# Patient Record
Sex: Female | Born: 1965 | Race: Black or African American | Hispanic: No | Marital: Married | Smoking: Never smoker
Health system: Southern US, Community
[De-identification: ages and names within clinical notes are randomized; demographics above are authoritative.]

## PROBLEM LIST (undated history)

## (undated) DIAGNOSIS — R0602 Shortness of breath: Secondary | ICD-10-CM

## (undated) DIAGNOSIS — U071 COVID-19: Secondary | ICD-10-CM

## (undated) DIAGNOSIS — K219 Gastro-esophageal reflux disease without esophagitis: Secondary | ICD-10-CM

## (undated) DIAGNOSIS — K641 Second degree hemorrhoids: Secondary | ICD-10-CM

## (undated) DIAGNOSIS — R739 Hyperglycemia, unspecified: Secondary | ICD-10-CM

## (undated) DIAGNOSIS — R7303 Prediabetes: Secondary | ICD-10-CM

## (undated) DIAGNOSIS — G932 Benign intracranial hypertension: Secondary | ICD-10-CM

## (undated) DIAGNOSIS — K228 Other specified diseases of esophagus: Secondary | ICD-10-CM

## (undated) DIAGNOSIS — N183 Chronic kidney disease, stage 3 unspecified: Secondary | ICD-10-CM

## (undated) DIAGNOSIS — Z6841 Body Mass Index (BMI) 40.0 and over, adult: Secondary | ICD-10-CM

## (undated) DIAGNOSIS — R519 Headache, unspecified: Secondary | ICD-10-CM

## (undated) DIAGNOSIS — O169 Unspecified maternal hypertension, unspecified trimester: Secondary | ICD-10-CM

## (undated) DIAGNOSIS — S838X9A Sprain of other specified parts of unspecified knee, initial encounter: Secondary | ICD-10-CM

## (undated) DIAGNOSIS — T380X5A Adverse effect of glucocorticoids and synthetic analogues, initial encounter: Secondary | ICD-10-CM

## (undated) DIAGNOSIS — M1990 Unspecified osteoarthritis, unspecified site: Secondary | ICD-10-CM

## (undated) DIAGNOSIS — T7840XA Allergy, unspecified, initial encounter: Secondary | ICD-10-CM

## (undated) DIAGNOSIS — K2289 Other specified disease of esophagus: Secondary | ICD-10-CM

## (undated) DIAGNOSIS — R51 Headache: Secondary | ICD-10-CM

## (undated) HISTORY — DX: Body Mass Index (BMI) 40.0 and over, adult: Z684

## (undated) HISTORY — DX: COVID-19: U07.1

## (undated) HISTORY — DX: Allergy, unspecified, initial encounter: T78.40XA

## (undated) HISTORY — DX: Shortness of breath: R06.02

## (undated) HISTORY — DX: Prediabetes: R73.03

## (undated) HISTORY — DX: Morbid (severe) obesity due to excess calories: E66.01

## (undated) HISTORY — DX: Gastro-esophageal reflux disease without esophagitis: K21.9

## (undated) HISTORY — DX: Benign intracranial hypertension: G93.2

## (undated) HISTORY — DX: Unspecified osteoarthritis, unspecified site: M19.90

## (undated) HISTORY — PX: ABDOMINAL HYSTERECTOMY: SHX81

## (undated) HISTORY — DX: Second degree hemorrhoids: K64.1

## (undated) HISTORY — PX: TONSILLECTOMY: SUR1361

## (undated) HISTORY — PX: OTHER SURGICAL HISTORY: SHX169

## (undated) HISTORY — DX: Chronic kidney disease, stage 3 unspecified: N18.30

## (undated) HISTORY — PX: BREAST REDUCTION SURGERY: SHX8

---

## 1998-08-22 ENCOUNTER — Emergency Department (HOSPITAL_COMMUNITY): Admission: EM | Admit: 1998-08-22 | Discharge: 1998-08-22 | Payer: Self-pay | Admitting: Emergency Medicine

## 1998-08-22 ENCOUNTER — Encounter: Payer: Self-pay | Admitting: Emergency Medicine

## 1998-12-31 ENCOUNTER — Inpatient Hospital Stay (HOSPITAL_COMMUNITY): Admission: AD | Admit: 1998-12-31 | Discharge: 1998-12-31 | Payer: Self-pay | Admitting: Obstetrics and Gynecology

## 1999-04-29 ENCOUNTER — Emergency Department (HOSPITAL_COMMUNITY): Admission: EM | Admit: 1999-04-29 | Discharge: 1999-04-29 | Payer: Self-pay | Admitting: Emergency Medicine

## 2000-01-04 ENCOUNTER — Encounter: Payer: Self-pay | Admitting: Neurology

## 2000-01-04 ENCOUNTER — Ambulatory Visit (HOSPITAL_COMMUNITY): Admission: RE | Admit: 2000-01-04 | Discharge: 2000-01-04 | Payer: Self-pay | Admitting: Neurology

## 2000-04-12 ENCOUNTER — Emergency Department (HOSPITAL_COMMUNITY): Admission: EM | Admit: 2000-04-12 | Discharge: 2000-04-12 | Payer: Self-pay | Admitting: Emergency Medicine

## 2000-06-05 ENCOUNTER — Encounter: Admission: RE | Admit: 2000-06-05 | Discharge: 2000-07-09 | Payer: Self-pay | Admitting: Orthopedic Surgery

## 2001-01-26 ENCOUNTER — Emergency Department (HOSPITAL_COMMUNITY): Admission: EM | Admit: 2001-01-26 | Discharge: 2001-01-26 | Payer: Self-pay | Admitting: Emergency Medicine

## 2001-02-22 ENCOUNTER — Emergency Department (HOSPITAL_COMMUNITY): Admission: EM | Admit: 2001-02-22 | Discharge: 2001-02-22 | Payer: Self-pay | Admitting: Emergency Medicine

## 2001-04-04 ENCOUNTER — Other Ambulatory Visit: Admission: RE | Admit: 2001-04-04 | Discharge: 2001-04-04 | Payer: Self-pay | Admitting: Obstetrics and Gynecology

## 2001-09-18 ENCOUNTER — Inpatient Hospital Stay (HOSPITAL_COMMUNITY): Admission: EM | Admit: 2001-09-18 | Discharge: 2001-09-22 | Payer: Self-pay | Admitting: Pulmonary Disease

## 2001-09-18 ENCOUNTER — Encounter: Payer: Self-pay | Admitting: Pulmonary Disease

## 2001-10-13 ENCOUNTER — Ambulatory Visit (HOSPITAL_COMMUNITY): Admission: RE | Admit: 2001-10-13 | Discharge: 2001-10-13 | Payer: Self-pay | Admitting: Neurology

## 2001-10-27 ENCOUNTER — Encounter: Payer: Self-pay | Admitting: Pulmonary Disease

## 2001-10-27 ENCOUNTER — Ambulatory Visit (HOSPITAL_COMMUNITY): Admission: RE | Admit: 2001-10-27 | Discharge: 2001-10-27 | Payer: Self-pay | Admitting: Pulmonary Disease

## 2002-01-29 ENCOUNTER — Emergency Department (HOSPITAL_COMMUNITY): Admission: EM | Admit: 2002-01-29 | Discharge: 2002-01-30 | Payer: Self-pay | Admitting: *Deleted

## 2002-05-18 ENCOUNTER — Other Ambulatory Visit: Admission: RE | Admit: 2002-05-18 | Discharge: 2002-05-18 | Payer: Self-pay | Admitting: Obstetrics and Gynecology

## 2002-05-29 ENCOUNTER — Emergency Department (HOSPITAL_COMMUNITY): Admission: EM | Admit: 2002-05-29 | Discharge: 2002-05-29 | Payer: Self-pay | Admitting: Emergency Medicine

## 2002-06-02 ENCOUNTER — Ambulatory Visit (HOSPITAL_COMMUNITY): Admission: RE | Admit: 2002-06-02 | Discharge: 2002-06-02 | Payer: Self-pay | Admitting: Obstetrics and Gynecology

## 2002-06-02 ENCOUNTER — Encounter: Payer: Self-pay | Admitting: Obstetrics and Gynecology

## 2003-09-01 ENCOUNTER — Other Ambulatory Visit: Admission: RE | Admit: 2003-09-01 | Discharge: 2003-09-01 | Payer: Self-pay | Admitting: Obstetrics and Gynecology

## 2003-12-20 ENCOUNTER — Ambulatory Visit (HOSPITAL_BASED_OUTPATIENT_CLINIC_OR_DEPARTMENT_OTHER): Admission: RE | Admit: 2003-12-20 | Discharge: 2003-12-20 | Payer: Self-pay | Admitting: Specialist

## 2003-12-20 ENCOUNTER — Ambulatory Visit (HOSPITAL_COMMUNITY): Admission: RE | Admit: 2003-12-20 | Discharge: 2003-12-20 | Payer: Self-pay | Admitting: Specialist

## 2003-12-20 ENCOUNTER — Encounter (INDEPENDENT_AMBULATORY_CARE_PROVIDER_SITE_OTHER): Payer: Self-pay | Admitting: Specialist

## 2004-05-05 ENCOUNTER — Ambulatory Visit: Payer: Self-pay | Admitting: Pulmonary Disease

## 2004-06-23 ENCOUNTER — Ambulatory Visit: Payer: Self-pay | Admitting: Pulmonary Disease

## 2004-07-17 ENCOUNTER — Ambulatory Visit: Payer: Self-pay | Admitting: Pulmonary Disease

## 2004-09-27 ENCOUNTER — Ambulatory Visit: Payer: Self-pay | Admitting: Pulmonary Disease

## 2004-10-05 ENCOUNTER — Other Ambulatory Visit: Admission: RE | Admit: 2004-10-05 | Discharge: 2004-10-05 | Payer: Self-pay | Admitting: Obstetrics and Gynecology

## 2004-10-22 ENCOUNTER — Emergency Department (HOSPITAL_COMMUNITY): Admission: EM | Admit: 2004-10-22 | Discharge: 2004-10-22 | Payer: Self-pay | Admitting: Emergency Medicine

## 2004-11-03 ENCOUNTER — Ambulatory Visit: Payer: Self-pay | Admitting: Pulmonary Disease

## 2005-01-16 ENCOUNTER — Ambulatory Visit: Payer: Self-pay | Admitting: Pulmonary Disease

## 2005-02-14 ENCOUNTER — Ambulatory Visit: Payer: Self-pay | Admitting: Pulmonary Disease

## 2005-03-29 ENCOUNTER — Ambulatory Visit: Payer: Self-pay | Admitting: Pulmonary Disease

## 2005-04-18 ENCOUNTER — Ambulatory Visit: Payer: Self-pay | Admitting: Pulmonary Disease

## 2005-04-19 ENCOUNTER — Encounter (INDEPENDENT_AMBULATORY_CARE_PROVIDER_SITE_OTHER): Payer: Self-pay | Admitting: *Deleted

## 2005-04-19 ENCOUNTER — Ambulatory Visit (HOSPITAL_COMMUNITY): Admission: RE | Admit: 2005-04-19 | Discharge: 2005-04-19 | Payer: Self-pay | Admitting: Obstetrics and Gynecology

## 2005-05-02 ENCOUNTER — Ambulatory Visit: Payer: Self-pay | Admitting: Pulmonary Disease

## 2005-05-23 ENCOUNTER — Ambulatory Visit: Payer: Self-pay | Admitting: Pulmonary Disease

## 2005-06-01 ENCOUNTER — Ambulatory Visit (HOSPITAL_COMMUNITY): Admission: RE | Admit: 2005-06-01 | Discharge: 2005-06-01 | Payer: Self-pay | Admitting: Infectious Diseases

## 2005-06-05 ENCOUNTER — Ambulatory Visit (HOSPITAL_COMMUNITY): Admission: RE | Admit: 2005-06-05 | Discharge: 2005-06-05 | Payer: Self-pay | Admitting: Infectious Diseases

## 2005-06-21 ENCOUNTER — Ambulatory Visit: Payer: Self-pay | Admitting: Pulmonary Disease

## 2005-06-22 ENCOUNTER — Encounter: Admission: RE | Admit: 2005-06-22 | Discharge: 2005-06-22 | Payer: Self-pay | Admitting: Orthopedic Surgery

## 2005-08-09 ENCOUNTER — Encounter (INDEPENDENT_AMBULATORY_CARE_PROVIDER_SITE_OTHER): Payer: Self-pay | Admitting: *Deleted

## 2005-08-09 ENCOUNTER — Ambulatory Visit (HOSPITAL_COMMUNITY): Admission: RE | Admit: 2005-08-09 | Discharge: 2005-08-11 | Payer: Self-pay | Admitting: Obstetrics and Gynecology

## 2005-09-04 ENCOUNTER — Ambulatory Visit: Payer: Self-pay | Admitting: Pulmonary Disease

## 2005-10-02 ENCOUNTER — Ambulatory Visit: Payer: Self-pay | Admitting: Pulmonary Disease

## 2005-11-02 ENCOUNTER — Ambulatory Visit: Payer: Self-pay | Admitting: Pulmonary Disease

## 2005-12-03 ENCOUNTER — Other Ambulatory Visit: Admission: RE | Admit: 2005-12-03 | Discharge: 2005-12-03 | Payer: Self-pay | Admitting: Obstetrics and Gynecology

## 2006-03-07 ENCOUNTER — Ambulatory Visit: Payer: Self-pay | Admitting: Pulmonary Disease

## 2006-03-21 ENCOUNTER — Ambulatory Visit: Payer: Self-pay | Admitting: Internal Medicine

## 2006-04-05 ENCOUNTER — Ambulatory Visit: Payer: Self-pay | Admitting: Internal Medicine

## 2006-04-22 ENCOUNTER — Ambulatory Visit: Payer: Self-pay | Admitting: Internal Medicine

## 2006-04-24 ENCOUNTER — Ambulatory Visit (HOSPITAL_COMMUNITY): Admission: RE | Admit: 2006-04-24 | Discharge: 2006-04-24 | Payer: Self-pay | Admitting: Pulmonary Disease

## 2006-05-01 ENCOUNTER — Ambulatory Visit (HOSPITAL_COMMUNITY): Admission: RE | Admit: 2006-05-01 | Discharge: 2006-05-01 | Payer: Self-pay | Admitting: Infectious Diseases

## 2006-05-02 ENCOUNTER — Ambulatory Visit: Payer: Self-pay | Admitting: Internal Medicine

## 2006-06-01 ENCOUNTER — Emergency Department (HOSPITAL_COMMUNITY): Admission: EM | Admit: 2006-06-01 | Discharge: 2006-06-01 | Payer: Self-pay | Admitting: Family Medicine

## 2006-06-13 ENCOUNTER — Ambulatory Visit: Payer: Self-pay | Admitting: Pulmonary Disease

## 2006-07-02 ENCOUNTER — Ambulatory Visit: Payer: Self-pay | Admitting: Pulmonary Disease

## 2006-08-09 LAB — CONVERTED CEMR LAB: Pap Smear: NORMAL

## 2006-08-20 ENCOUNTER — Ambulatory Visit: Payer: Self-pay | Admitting: Pulmonary Disease

## 2006-09-03 ENCOUNTER — Ambulatory Visit: Payer: Self-pay | Admitting: Pulmonary Disease

## 2006-11-15 ENCOUNTER — Ambulatory Visit: Payer: Self-pay | Admitting: Pulmonary Disease

## 2006-12-17 ENCOUNTER — Ambulatory Visit: Payer: Self-pay | Admitting: Internal Medicine

## 2006-12-25 ENCOUNTER — Ambulatory Visit: Payer: Self-pay | Admitting: Internal Medicine

## 2007-01-24 ENCOUNTER — Ambulatory Visit: Payer: Self-pay | Admitting: Pulmonary Disease

## 2007-01-28 DIAGNOSIS — J189 Pneumonia, unspecified organism: Secondary | ICD-10-CM | POA: Insufficient documentation

## 2007-01-28 DIAGNOSIS — K219 Gastro-esophageal reflux disease without esophagitis: Secondary | ICD-10-CM | POA: Insufficient documentation

## 2007-01-28 DIAGNOSIS — I10 Essential (primary) hypertension: Secondary | ICD-10-CM | POA: Insufficient documentation

## 2007-02-03 ENCOUNTER — Ambulatory Visit: Payer: Self-pay | Admitting: Internal Medicine

## 2007-02-04 ENCOUNTER — Ambulatory Visit: Payer: Self-pay | Admitting: Pulmonary Disease

## 2007-02-12 ENCOUNTER — Ambulatory Visit: Payer: Self-pay | Admitting: Pulmonary Disease

## 2007-03-27 ENCOUNTER — Encounter: Payer: Self-pay | Admitting: Internal Medicine

## 2007-03-27 ENCOUNTER — Ambulatory Visit: Payer: Self-pay | Admitting: Internal Medicine

## 2007-04-03 ENCOUNTER — Encounter: Payer: Self-pay | Admitting: Internal Medicine

## 2007-04-30 ENCOUNTER — Ambulatory Visit: Payer: Self-pay | Admitting: Internal Medicine

## 2007-04-30 DIAGNOSIS — G43701 Chronic migraine without aura, not intractable, with status migrainosus: Secondary | ICD-10-CM | POA: Insufficient documentation

## 2007-04-30 DIAGNOSIS — J3089 Other allergic rhinitis: Secondary | ICD-10-CM | POA: Insufficient documentation

## 2007-04-30 DIAGNOSIS — G932 Benign intracranial hypertension: Secondary | ICD-10-CM | POA: Insufficient documentation

## 2007-04-30 DIAGNOSIS — J302 Other seasonal allergic rhinitis: Secondary | ICD-10-CM | POA: Insufficient documentation

## 2007-04-30 DIAGNOSIS — J40 Bronchitis, not specified as acute or chronic: Secondary | ICD-10-CM | POA: Insufficient documentation

## 2007-04-30 DIAGNOSIS — Z87448 Personal history of other diseases of urinary system: Secondary | ICD-10-CM | POA: Insufficient documentation

## 2007-05-03 ENCOUNTER — Encounter: Payer: Self-pay | Admitting: Internal Medicine

## 2007-05-03 ENCOUNTER — Ambulatory Visit: Payer: Self-pay | Admitting: Internal Medicine

## 2007-05-04 ENCOUNTER — Ambulatory Visit: Payer: Self-pay | Admitting: Internal Medicine

## 2007-05-04 ENCOUNTER — Inpatient Hospital Stay (HOSPITAL_COMMUNITY): Admission: EM | Admit: 2007-05-04 | Discharge: 2007-05-05 | Payer: Self-pay | Admitting: Emergency Medicine

## 2007-05-04 ENCOUNTER — Telehealth (INDEPENDENT_AMBULATORY_CARE_PROVIDER_SITE_OTHER): Payer: Self-pay | Admitting: *Deleted

## 2007-05-08 ENCOUNTER — Ambulatory Visit: Payer: Self-pay | Admitting: Internal Medicine

## 2007-05-08 DIAGNOSIS — J209 Acute bronchitis, unspecified: Secondary | ICD-10-CM | POA: Insufficient documentation

## 2007-05-09 ENCOUNTER — Ambulatory Visit: Payer: Self-pay | Admitting: Internal Medicine

## 2007-06-26 ENCOUNTER — Telehealth (INDEPENDENT_AMBULATORY_CARE_PROVIDER_SITE_OTHER): Payer: Self-pay | Admitting: *Deleted

## 2007-06-26 ENCOUNTER — Telehealth: Payer: Self-pay | Admitting: Adult Health

## 2007-07-23 ENCOUNTER — Ambulatory Visit: Payer: Self-pay | Admitting: Internal Medicine

## 2007-07-23 DIAGNOSIS — T17908A Unspecified foreign body in respiratory tract, part unspecified causing other injury, initial encounter: Secondary | ICD-10-CM | POA: Insufficient documentation

## 2007-07-25 ENCOUNTER — Ambulatory Visit: Payer: Self-pay | Admitting: Internal Medicine

## 2007-07-29 ENCOUNTER — Telehealth (INDEPENDENT_AMBULATORY_CARE_PROVIDER_SITE_OTHER): Payer: Self-pay | Admitting: *Deleted

## 2007-08-25 ENCOUNTER — Encounter (INDEPENDENT_AMBULATORY_CARE_PROVIDER_SITE_OTHER): Payer: Self-pay | Admitting: *Deleted

## 2007-10-14 ENCOUNTER — Ambulatory Visit (HOSPITAL_COMMUNITY): Admission: RE | Admit: 2007-10-14 | Discharge: 2007-10-14 | Payer: Self-pay | Admitting: Internal Medicine

## 2007-10-20 ENCOUNTER — Encounter (INDEPENDENT_AMBULATORY_CARE_PROVIDER_SITE_OTHER): Payer: Self-pay | Admitting: *Deleted

## 2007-10-20 ENCOUNTER — Encounter: Payer: Self-pay | Admitting: Internal Medicine

## 2007-10-20 DIAGNOSIS — K224 Dyskinesia of esophagus: Secondary | ICD-10-CM | POA: Insufficient documentation

## 2007-10-28 ENCOUNTER — Ambulatory Visit: Payer: Self-pay | Admitting: Internal Medicine

## 2007-11-12 ENCOUNTER — Ambulatory Visit: Payer: Self-pay | Admitting: Gastroenterology

## 2007-11-12 DIAGNOSIS — R1319 Other dysphagia: Secondary | ICD-10-CM | POA: Insufficient documentation

## 2007-11-12 DIAGNOSIS — R933 Abnormal findings on diagnostic imaging of other parts of digestive tract: Secondary | ICD-10-CM | POA: Insufficient documentation

## 2007-11-26 LAB — CONVERTED CEMR LAB
ALT: 20 units/L (ref 0–35)
AST: 20 units/L (ref 0–37)
Albumin: 4 g/dL (ref 3.5–5.2)
Alkaline Phosphatase: 56 units/L (ref 39–117)
BUN: 12 mg/dL (ref 6–23)
Basophils Absolute: 0 10*3/uL (ref 0.0–0.1)
Basophils Relative: 0.6 % (ref 0.0–3.0)
CO2: 26 meq/L (ref 19–32)
Calcium: 9.2 mg/dL (ref 8.4–10.5)
Chloride: 106 meq/L (ref 96–112)
Creatinine, Ser: 1 mg/dL (ref 0.4–1.2)
Eosinophils Absolute: 0.3 10*3/uL (ref 0.0–0.7)
Eosinophils Relative: 3.8 % (ref 0.0–5.0)
GFR calc Af Amer: 79 mL/min
GFR calc non Af Amer: 65 mL/min
Glucose, Bld: 102 mg/dL — ABNORMAL HIGH (ref 70–99)
HCT: 36.6 % (ref 36.0–46.0)
Hemoglobin: 12.2 g/dL (ref 12.0–15.0)
Lymphocytes Relative: 39.3 % (ref 12.0–46.0)
MCHC: 33.4 g/dL (ref 30.0–36.0)
MCV: 83 fL (ref 78.0–100.0)
Monocytes Absolute: 0.6 10*3/uL (ref 0.1–1.0)
Monocytes Relative: 7.8 % (ref 3.0–12.0)
Neutro Abs: 4 10*3/uL (ref 1.4–7.7)
Neutrophils Relative %: 48.5 % (ref 43.0–77.0)
Platelets: 333 10*3/uL (ref 150–400)
Potassium: 3.8 meq/L (ref 3.5–5.1)
RBC: 4.41 M/uL (ref 3.87–5.11)
RDW: 13 % (ref 11.5–14.6)
Sodium: 139 meq/L (ref 135–145)
Total Bilirubin: 0.7 mg/dL (ref 0.3–1.2)
Total Protein: 6.9 g/dL (ref 6.0–8.3)
WBC: 8 10*3/uL (ref 4.5–10.5)

## 2007-11-27 ENCOUNTER — Ambulatory Visit (HOSPITAL_COMMUNITY): Admission: RE | Admit: 2007-11-27 | Discharge: 2007-11-27 | Payer: Self-pay | Admitting: Gastroenterology

## 2007-11-27 ENCOUNTER — Encounter: Payer: Self-pay | Admitting: Gastroenterology

## 2007-11-28 ENCOUNTER — Encounter: Payer: Self-pay | Admitting: Internal Medicine

## 2007-11-28 ENCOUNTER — Encounter (INDEPENDENT_AMBULATORY_CARE_PROVIDER_SITE_OTHER): Payer: Self-pay | Admitting: *Deleted

## 2007-12-03 ENCOUNTER — Ambulatory Visit: Payer: Self-pay | Admitting: Gastroenterology

## 2008-01-06 ENCOUNTER — Telehealth (INDEPENDENT_AMBULATORY_CARE_PROVIDER_SITE_OTHER): Payer: Self-pay | Admitting: *Deleted

## 2008-01-16 ENCOUNTER — Ambulatory Visit: Payer: Self-pay | Admitting: Gastroenterology

## 2008-02-03 ENCOUNTER — Telehealth: Payer: Self-pay | Admitting: Internal Medicine

## 2008-02-05 ENCOUNTER — Ambulatory Visit: Payer: Self-pay | Admitting: Gastroenterology

## 2008-02-05 ENCOUNTER — Ambulatory Visit (HOSPITAL_COMMUNITY): Admission: RE | Admit: 2008-02-05 | Discharge: 2008-02-05 | Payer: Self-pay | Admitting: Gastroenterology

## 2008-04-19 ENCOUNTER — Telehealth (INDEPENDENT_AMBULATORY_CARE_PROVIDER_SITE_OTHER): Payer: Self-pay | Admitting: *Deleted

## 2008-04-29 ENCOUNTER — Ambulatory Visit: Payer: Self-pay | Admitting: Internal Medicine

## 2008-07-05 ENCOUNTER — Encounter: Admission: RE | Admit: 2008-07-05 | Discharge: 2008-07-05 | Payer: Self-pay | Admitting: Occupational Medicine

## 2008-07-29 ENCOUNTER — Encounter: Admission: RE | Admit: 2008-07-29 | Discharge: 2008-10-27 | Payer: Self-pay | Admitting: Occupational Medicine

## 2008-08-05 ENCOUNTER — Telehealth (INDEPENDENT_AMBULATORY_CARE_PROVIDER_SITE_OTHER): Payer: Self-pay | Admitting: *Deleted

## 2008-08-16 ENCOUNTER — Emergency Department (HOSPITAL_COMMUNITY): Admission: EM | Admit: 2008-08-16 | Discharge: 2008-08-16 | Payer: Self-pay | Admitting: Emergency Medicine

## 2008-08-21 ENCOUNTER — Encounter: Admission: RE | Admit: 2008-08-21 | Discharge: 2008-08-21 | Payer: Self-pay | Admitting: Orthopedic Surgery

## 2008-08-27 ENCOUNTER — Ambulatory Visit: Payer: Self-pay | Admitting: Internal Medicine

## 2008-09-02 ENCOUNTER — Ambulatory Visit (HOSPITAL_BASED_OUTPATIENT_CLINIC_OR_DEPARTMENT_OTHER): Admission: RE | Admit: 2008-09-02 | Discharge: 2008-09-02 | Payer: Self-pay | Admitting: Orthopedic Surgery

## 2008-09-02 HISTORY — PX: KNEE SURGERY: SHX244

## 2008-09-21 ENCOUNTER — Encounter: Admission: RE | Admit: 2008-09-21 | Discharge: 2008-12-20 | Payer: Self-pay | Admitting: Orthopedic Surgery

## 2008-09-28 ENCOUNTER — Ambulatory Visit: Payer: Self-pay | Admitting: Internal Medicine

## 2008-11-02 ENCOUNTER — Ambulatory Visit (HOSPITAL_BASED_OUTPATIENT_CLINIC_OR_DEPARTMENT_OTHER): Admission: RE | Admit: 2008-11-02 | Discharge: 2008-11-02 | Payer: Self-pay | Admitting: Orthopedic Surgery

## 2009-04-13 ENCOUNTER — Ambulatory Visit: Payer: Self-pay | Admitting: Internal Medicine

## 2009-04-23 HISTORY — PX: KNEE SURGERY: SHX244

## 2009-04-29 ENCOUNTER — Telehealth: Payer: Self-pay | Admitting: Internal Medicine

## 2009-04-30 ENCOUNTER — Encounter: Payer: Self-pay | Admitting: Pulmonary Disease

## 2009-04-30 ENCOUNTER — Emergency Department (HOSPITAL_COMMUNITY): Admission: EM | Admit: 2009-04-30 | Discharge: 2009-04-30 | Payer: Self-pay | Admitting: Emergency Medicine

## 2009-05-02 ENCOUNTER — Ambulatory Visit: Payer: Self-pay | Admitting: Internal Medicine

## 2009-05-12 ENCOUNTER — Telehealth: Payer: Self-pay | Admitting: Adult Health

## 2009-05-29 ENCOUNTER — Telehealth: Payer: Self-pay | Admitting: Internal Medicine

## 2009-05-30 ENCOUNTER — Ambulatory Visit (HOSPITAL_COMMUNITY): Admission: RE | Admit: 2009-05-30 | Discharge: 2009-05-30 | Payer: Self-pay | Admitting: Internal Medicine

## 2009-05-30 ENCOUNTER — Ambulatory Visit: Payer: Self-pay | Admitting: Internal Medicine

## 2009-06-02 ENCOUNTER — Ambulatory Visit: Payer: Self-pay | Admitting: Internal Medicine

## 2009-06-02 ENCOUNTER — Inpatient Hospital Stay (HOSPITAL_COMMUNITY): Admission: AD | Admit: 2009-06-02 | Discharge: 2009-06-05 | Payer: Self-pay | Admitting: Internal Medicine

## 2009-06-06 ENCOUNTER — Telehealth: Payer: Self-pay | Admitting: Internal Medicine

## 2009-06-08 ENCOUNTER — Ambulatory Visit: Payer: Self-pay | Admitting: Internal Medicine

## 2009-08-29 ENCOUNTER — Telehealth: Payer: Self-pay | Admitting: Internal Medicine

## 2009-08-30 ENCOUNTER — Ambulatory Visit: Payer: Self-pay | Admitting: Internal Medicine

## 2009-09-28 ENCOUNTER — Telehealth (INDEPENDENT_AMBULATORY_CARE_PROVIDER_SITE_OTHER): Payer: Self-pay | Admitting: *Deleted

## 2009-11-29 ENCOUNTER — Telehealth (INDEPENDENT_AMBULATORY_CARE_PROVIDER_SITE_OTHER): Payer: Self-pay | Admitting: *Deleted

## 2009-12-29 ENCOUNTER — Telehealth: Payer: Self-pay | Admitting: Internal Medicine

## 2010-01-23 ENCOUNTER — Telehealth: Payer: Self-pay | Admitting: Internal Medicine

## 2010-03-03 ENCOUNTER — Telehealth: Payer: Self-pay | Admitting: Internal Medicine

## 2010-03-22 ENCOUNTER — Ambulatory Visit: Payer: Self-pay | Admitting: Internal Medicine

## 2010-03-22 ENCOUNTER — Telehealth (INDEPENDENT_AMBULATORY_CARE_PROVIDER_SITE_OTHER): Payer: Self-pay | Admitting: *Deleted

## 2010-03-22 DIAGNOSIS — J45901 Unspecified asthma with (acute) exacerbation: Secondary | ICD-10-CM | POA: Insufficient documentation

## 2010-04-07 ENCOUNTER — Ambulatory Visit: Payer: Self-pay | Admitting: Internal Medicine

## 2010-04-07 ENCOUNTER — Telehealth (INDEPENDENT_AMBULATORY_CARE_PROVIDER_SITE_OTHER): Payer: Self-pay | Admitting: *Deleted

## 2010-04-21 ENCOUNTER — Ambulatory Visit: Payer: Self-pay | Admitting: Internal Medicine

## 2010-04-21 ENCOUNTER — Encounter: Payer: Self-pay | Admitting: Internal Medicine

## 2010-04-21 ENCOUNTER — Ambulatory Visit
Admission: RE | Admit: 2010-04-21 | Discharge: 2010-04-21 | Payer: Self-pay | Source: Home / Self Care | Attending: Internal Medicine | Admitting: Internal Medicine

## 2010-04-28 ENCOUNTER — Ambulatory Visit: Admit: 2010-04-28 | Payer: Self-pay | Admitting: Internal Medicine

## 2010-05-02 LAB — CONVERTED CEMR LAB: IgE (Immunoglobulin E), Serum: 49.5 intl units/mL (ref 0.0–180.0)

## 2010-05-14 ENCOUNTER — Encounter: Payer: Self-pay | Admitting: Internal Medicine

## 2010-05-14 ENCOUNTER — Encounter: Payer: Self-pay | Admitting: Orthopedic Surgery

## 2010-05-15 ENCOUNTER — Encounter: Payer: Self-pay | Admitting: Orthopedic Surgery
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
## 2010-05-23 NOTE — Letter (Signed)
Summary: [facility] Consult Scheduled Letter  [facility]  [address] [place], [place] [address]   Phone: [phone]  Fax: [phone]      08/25/2007 MRN: [id number]  [facility] [address] [place], [place]  [address]    Dear Ms. Pitones,      We have scheduled an appointment for you.  At the recommendation of Dr. Debby Bud, we have scheduled you a Test with [facility] on Sep 01, 2007 at 11:30am.  Their phone number is [phone].  If this appointment day and time is not convenient for you, please feel free to call the office of the doctor you are being referred to at the number listed above and reschedule the appointment.  [facility] Outpatient Admitting [address] [place], [place] [address]  Thank you,  Patient Care Coordinator [facility]

## 2010-05-23 NOTE — Progress Notes (Signed)
Summary: nos appt  Phone Note Call from Patient   Caller: [email or web address]  Call For: Errick Salts Summary of Call: Rsc nos from 5/6 to 5/10 @ 10a. Initial call taken by: Darletta Moll,  Aug 29, 2009 11:10 AM

## 2010-05-23 NOTE — Progress Notes (Signed)
Summary: calling about prescripts  Phone Note Call from Patient Call back at [phone]   Caller: [facility] Otherpharm[facility] sheila Call For: young Summary of Call: questions about perscript written on 4-03 /09 Patient's chart has been requested.  Initial call taken by: Rickard Patience,  July 29, 2007 4:37 PM  Follow-up for Phone Call        spoke with sheila at [facility].  sheila had question about rx written yesterday (april 3) on patanol, which is eye drop medicaiton.  i read through dr. Roxy Cedar patient instructions which said patanase- not patanol- so informed pharmacist that patanol is an error and it should be for patanase.  her other question was the prn  refills. she needed an actual # so I gave her 11 refills on both epi pen rx and patanase rx.   Follow-up by: Cyndia Diver LPN,  July 29, 2007 4:53 PM

## 2010-05-23 NOTE — Progress Notes (Signed)
Summary: req to be seen TODAY- asthma  Phone Note Call from Patient   Caller: Patient Call For: young Summary of Call: pt having asthma flare-up. wants to be seen today. [phone] Initial call taken by: Tivis Ringer, CNA,  March 22, 2010 1:47 PM  Follow-up for Phone Call        Called and spoke with pt.  She states having flare- increased SOB, wheezing, cough.  At the time of the call, she was down in Primary care here seeing Dr Felicity Coyer for this.  I advised will forward msg to Dr Maple Hudson so that he is aware. Follow-up by: Vernie Murders,  March 22, 2010 3:12 PM

## 2010-05-23 NOTE — Procedures (Signed)
Summary: EGD   EGD  Procedure date:  02/05/2008  Findings:      Location: [facility]    Patient Name: [name], [name] MRN: [id number] Procedure Procedures: Panendoscopy (EGD) CPT: 43235.    with balloon dilation. CPT: T9508883.  Personnel: Endoscopist: Rachael Fee, MD.  Exam Location: Exam performed in Endoscopy Suite. Outpatient  Patient Consent: Procedure, Alternatives, Risks and Benefits discussed, consent obtained, from patient. Consent was obtained by the RN.  Indications Symptoms: Dysphagia.  Comments: tight GE junction, dilated up to 19mm 8/09 with nearly complete but not total relief of symptoms History  Current Medications: Patient is not currently taking Coumadin.  Comments: Patient history reviewed/updated, physical exam performed prior to initiation of sedation? yes Pre-Exam Physical: Performed Feb 05, 2008  Cardio-pulmonary exam, Abdominal exam, Mental status exam WNL.  Comments: Pt. history reviewed/updated, physical exam performed prior to initiation of sedation? yes Exam Exam Info: Maximum depth of insertion Stomach, intended Stomach. Patient position: on left side. Gastric retroflexion performed. Images taken. ASA Classification: II. Tolerance: good.  Sedation Meds: Patient assessed and found to be appropriate for moderate (conscious) sedation. Fentanyl 75 mcg. given IV. Versed 7.5 mg. given IV.  Monitoring: BP and pulse monitoring done. Oximetry used. Supplemental O2 given  Findings - Normal: Proximal Esophagus to Antrum. Comments: otherwise normal examination.  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  Comment: Normal mucosa, smooth stenosis at GE junction that did not limit scope passage.  This was dilated up to 20mm with CRE TTS balloon held inflated for 1 minute.   Assessment: Smooth GE junction stenosis, normal mucosa.  She had partial relief of her intermittent dysphagia after dilation to 19mm two months ago.  Repeat dilation today up to 20mm with CRE TTS balloon.  If her dysphagia returns, will proceed with esophageal manometry to check for achalasia.
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
## 2010-05-23 NOTE — Assessment & Plan Note (Signed)
Summary: NEW/[employer]HC/NWS   Vital Signs:  Patient Profile:   45 Years Old Female Height:     64 inches Weight:      236 pounds BMI:     40.66 Temp:     97.5 degrees F oral Pulse rate:   85 / minute BP sitting:   138 / 87  (left arm) Cuff size:   large  Vitals Entered By: Zackery Barefoot CMA (April 30, 2007 1:48 PM)                 PCP:  Jandel Patriarca  Chief Complaint:  New pt.  History of Present Illness: Patient presents today to establish for on-going continuity care.  No particular problems.  Current Allergies (reviewed today): PHENERGAN Geoffry Paradise  Past Medical History:    Reviewed history and no changes required:       UCD       preeclampsia       pseudotumor cerebri - has required LP for release of pressure       Allergic rhinitis       Asthma-h/o intubation '01                            Physician Roster:                       Pul/allergy - Claudell Kyle  Past Surgical History:    Reviewed history and no changes required:       Tonsillectomy       uterine ablation for metorrhagia/fibroids   Family History:    father -died 66, CVA, HTN    mother - Sep 28, 2050;  CVA, HTN    Neg- breast, colon cancer; CAD    DM - grandmother and aunt  Social History:    [employer]    married 09-27-1992    1 son 09/28/1986 in college [employer]    Work: Charity fundraiser at [employer]    SO - good health    marriage good health    no hx/o abuse   Risk Factors:  Tobacco use:  never Caffeine use:  0 drinks per day Alcohol use:  yes    Type:  wine    Drinks per day:  <1    Counseled to quit/cut down alcohol use:  no Exercise:  yes    Times per week:  3    Type:  aerobic exercise; weight work Seatbelt use:  100 %  PAP Smear History:     Date of Last PAP Smear:  08/09/2006    Results:  Normal   Mammogram History:     Date of Last Mammogram:  09/06/2004    Results:  Normal Bilateral    Review of Systems       The patient complains of dyspnea on exhertion.  The  patient denies fever, vision loss, chest pain, syncope, peripheral edema, abdominal pain, muscle weakness, depression, and breast masses.     Physical Exam  General:     alert, well-developed, well-nourished, well-hydrated, and overweight-appearing.   Head:     Normocephalic and atraumatic without obvious abnormalities. No apparent alopecia or balding. Eyes:     No corneal or conjunctival inflammation noted. EOMI. Perrla. Funduscopic exam benign, without hemorrhages, exudates or papilledema. Vision grossly normal. Mouth:  no oral lesion. Native dentition with some missing teeth. Neck:     No deformities, masses, or tenderness noted. Lungs:     normal respiratory effort, no intercostal retractions, no accessory muscle use, normal breath sounds, and no wheezes.   Heart:     normal rate, regular rhythm, no murmur, and no gallop.      Impression & Recommendations:  Problem # 1:  ASTHMA (ICD-493.90) Presently stable. discussed with the patient the need to seek help if: using rescue inhaler more than 3 times/24hfrs; peakflow in the yellow zone. DO NOT STAY AT HOME FIXING THIS PROBLEM YOURSELF!  Her updated medication list for this problem includes:    Symbicort 160-4.5 Mcg/act Aero (Budesonide-formoterol fumarate) ..... Inhale 2 puffs two times a day    Singulair 10 Mg Tabs (Montelukast sodium) .Marland Kitchen... Take 1 tablet by mouth once a day    Albuterol 90 Mcg/act Aers (Albuterol) ..... Inhale 2 puffs every four hours as needed    Xopenex 1.25 Mg/26ml Nebu (Levalbuterol hcl) ..... Use as directed   Problem # 2:  Hx of HYPERTENSION (ICD-401.9)  Her updated medication list for this problem includes:    Furosemide 40 Mg Tabs (Furosemide) .Marland Kitchen... Take 1 tablet by mouth once a day as needed.  BP today: 138/87 Prior BP: 130/88 (03/27/2007)  Normal blood pressure. NO need for intervention.   Problem # 3:  PSEUDOTUMOR CEREBRI (ICD-348.2) Stable. Has not had to have LP for 4 years.  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 Usually responds to  NSAIDs.  Her updated medication list for this problem includes:    Tramadol Hcl 50 Mg Tabs (Tramadol hcl) .Marland Kitchen... Take 1 tablet by mouth every 4 hours as needed.   Problem # 5:  ALLERGIC RHINITIS (ICD-477.9) Presently stable.  Her updated medication list for this problem includes:    Zyrtec Allergy 10 Mg Tabs (Cetirizine hcl) .Marland Kitchen... Take 1 tablet by mouth once a day at bedtime    Veramyst 27.5 Mcg/spray Susp (Fluticasone furoate) .Marland Kitchen... Take 2 sprays in each nostril once a day   Problem # 6:  Preventive Health Care (ICD-V70.0) Current with health maintenance. Please obtain copies of routine labs for our record.  Oriented to practice: may e-mail for routine communication, not for urgent or emergent problems.  Complete Medication List: 1)  Symbicort 160-4.5 Mcg/act Aero (Budesonide-formoterol fumarate) .... Inhale 2 puffs two times a day 2)  Zyrtec Allergy 10 Mg Tabs (Cetirizine hcl) .... Take 1 tablet by mouth once a day at bedtime 3)  Protonix 40 Mg Tbec (Pantoprazole sodium) .... Take 1 tablet by mouth two times a day 4)  Singulair 10 Mg Tabs (Montelukast sodium) .... Take 1 tablet by mouth once a day 5)  Veramyst 27.5 Mcg/spray Susp (Fluticasone furoate) .... Take 2 sprays in each nostril once a day 6)  Furosemide 40 Mg Tabs (Furosemide) .... Take 1 tablet by mouth once a day as needed. 7)  Patanol 0.1 % Soln (Olopatadine hcl) .... Take 2 sprays in each nostril once a day 8)  Epipen 0.3 Mg/0.17ml (1:1000) Devi (Epinephrine hcl (anaphylaxis)) .... Use as directed 9)  Albuterol 90 Mcg/act Aers (Albuterol) .... Inhale 2 puffs every four hours as needed 10)  Mucinex Dm 30-600 Mg Tb12 (Dextromethorphan-guaifenesin) .... Take 1 to 2 tablets by mouth every 4 hours as needed. 11)  Tramadol Hcl 50 Mg Tabs (Tramadol hcl) .... Take 1 tablet by mouth every 4 hours as needed.  12)  Tussionex Pennkinetic Er  8-10 Mg/29ml Lqcr (Chlorpheniramine-hydrocodone) .... Take 1 teaspoon by mouht every 12 hours as needed. 13)  Xopenex 1.25 Mg/33ml Nebu (Levalbuterol hcl) .... Use as directed     ]

## 2010-05-23 NOTE — Progress Notes (Signed)
Summary: Tussionex refill request  Phone Note Call from Patient   Summary of Call: Pt called and spoke with Budd Palmer who then transfered pt to side B where Leigh A took a message stating-still sick-has zpak and steroid but needs her Tussionex refilled just filled this on 04-20-09  1tsp two times a day Please advise. [facility]. Zackery Barefoot CMA  April 29, 2009 8:52 AM   Follow-up for Phone Call        Per CY-110mL 1 tsp every 12 hours as needed. 0 refills. Rx called to [facility]. Left message @ pt's work that "what she requested has been sent." Zackery Barefoot CMA  April 29, 2009 9:00 AM     Prescriptions: Sandria Senter ER 8-10 MG/5ML LQCR (CHLORPHENIRAMINE-HYDROCODONE) 1 teaspoonful  by mouth two times a day as needed  #1106ml x 0   Entered by:   Zackery Barefoot CMA   Authorized by:   Waymon Budge MD   Signed by:   Zackery Barefoot CMA on 04/29/2009   Method used:   Telephoned to ...       [facility]* (retail)       [address]       [address] Shipping/mailing       [place], [place]  [address]       Ph: [phone]       Fax: [phone]   RxID:   [id number]

## 2010-05-23 NOTE — Progress Notes (Signed)
Summary: nos appt  Phone Note Call from Patient   Caller: [email or web address]  Call For: young Summary of Call: Rsc nos from 9/30 to 11/10. Initial call taken by: Darletta Moll,  January 23, 2010 9:28 AM

## 2010-05-23 NOTE — Assessment & Plan Note (Signed)
Summary: URI /NWS   Vital Signs:  Patient profile:   45 year old female Height:      63 inches Weight:      203 pounds BMI:     36.09 O2 Sat:      98 % on Room air Temp:     98.1 degrees F oral Pulse rate:   101 / minute BP sitting:   126 / 90  (left arm) Cuff size:   large  Vitals Entered By: Bill Salinas CMA (May 30, 2009 9:48 AM)  O2 Flow:  Room air CC: PT here with c/o ongoing head congestion, nasal drainage, cough and problems with her asthma/ ab   Primary Care Provider:  Illene Regulus, MD  CC:  PT here with c/o ongoing head congestion, nasal drainage, and cough and problems with her asthma/ ab.  History of Present Illness: Patient with known asthma. Sheis anurse at the hosptal and stopped me today to erport that she was having another flare with increased wheezing and breathing trouble. She had called Dr. Maple Hudson over the weekend and called in doxycycline. Due to her continued high use of MDIs and nebs - every 4 hrs - and a peak flow of 275 with her best being 400 she is seen acutely.  She denies fevers, cough with productive sputum. She is short of breath.  Current Medications (verified): 1)  Xopenex Hfa 45 Mcg/act Aero (Levalbuterol Tartrate) .... 2 Puffs Four Times A Day As Needed 2)  Singulair 10 Mg  Tabs (Montelukast Sodium) .... Take 1 Tablet By Mouth Once A Day 3)  Veramyst 27.5 Mcg/spray  Susp (Fluticasone Furoate) .... Take 2 Sprays in Each Nostril Once A Day 4)  Epipen 2-Pak 0.3 Mg/0.75ml (1:1000)  Devi (Epinephrine Hcl (Anaphylaxis)) .... For Severe Allergic Reaction 5)  Xopenex 1.25 Mg/50ml  Nebu (Levalbuterol Hcl) .... Use As Directed 6)  Mucinex Dm 30-600 Mg  Tb12 (Dextromethorphan-Guaifenesin) .... Take 1 To 2 Tablets By Mouth Every 4 Hours As Needed. 7)  Patanol 0.1 %  Soln (Olopatadine Hcl) .Marland Kitchen.. 1 Qtt Ou Two Times A Day As Needed Allergic Conjunctivitis 8)  Protonix 40 Mg  Tbec (Pantoprazole Sodium) .... Take 1 Tablet By Mouth Two Times A Day 9)   Furosemide 40 Mg  Tabs (Furosemide) .... Take 1 Tablet By Mouth Once A Day As Needed. 10)  Tramadol Hcl 50 Mg  Tabs (Tramadol Hcl) .... Take 1 Tablet By Mouth Every 4 Hours As Needed. 11)  Xyzal 5 Mg Tabs (Levocetirizine Dihydrochloride) .... Take 1 Tablet By Mouth Once A Day 12)  Tussin Cough 100 Mg/31ml Syrp (Guaifenesin) .... Use As Needed 13)  Tussionex Pennkinetic Er 8-10 Mg/43ml Lqcr (Chlorpheniramine-Hydrocodone) .Marland Kitchen.. 1 Teaspoonful  By Mouth Two Times A Day As Needed 14)  Symbicort 80-4.5 Mcg/act  Aero (Budesonide-Formoterol Fumarate) .... 2 Puffs First Thing  in Am and 2 Puffs Again in Pm About 12 Hours Later 15)  Terazol 7 0.4 % Crea (Terconazole) .... Use As Directed 16)  Prednisone 40mg  Taper  Allergies (verified): 1)  ! * Flu Vaccination 2)  Phenergan 3)  * Xolair  Past History:  Past Medical History: Last updated: 05/02/2009 UCD preeclampsia pseudotumor cerebri - has required LP for release of pressure Allergic rhinitis Asthma-h/o intubation '01 GERD Right Knee surgery 09/02/2008 miniscal tear Dysphagia......................................................................[name]     - EGD with dilatation 06/08/2007   Physician Roster:                 Pul/allergy -  Young                 Clayton Bibles Stefano Gaul  Past Surgical History: Last updated: 2007/05/20 Tonsillectomy uterine ablation for metorrhagia/fibroids  Family History: Last updated: May 20, 2007 father -died 46, CVA, HTN mother - 09/23/50;  CVA, HTN Neg- breast, colon cancer; CAD DM - grandmother and aunt  Social History: Last updated: 04/13/2009 [employer] married Sep 22, 1992 - remarried 09/2008 1 son 1986/09/23 in college [employer] Work: Charity fundraiser at [employer] SO - good health marriage good health no hx/o abuse  Risk Factors: Alcohol Use: <1 (20-May-2007) Caffeine Use: 0 (05/20/07) Exercise: yes (May 20, 2007)  Risk Factors: Smoking Status: never (May 20, 2007)  Review of Systems       The patient complains of dyspnea on  exertion, prolonged cough, and headaches.  The patient denies anorexia, fever, weight loss, decreased hearing, chest pain, syncope, hemoptysis, abdominal pain, difficulty walking, unusual weight change, and enlarged lymph nodes.    Physical Exam  General:  heavy set woman in no acute distress Head:  Normocephalic and atraumatic without obvious abnormalities. No apparent alopecia or balding. Eyes:  pupils equal, pupils round, corneas and lenses clear, and no injection.   Neck:  supple.   Chest Wall:  no deformities.   Lungs:  normal respiratory effort, no intercostal retractions, no accessory muscle use, normal breath sounds, no crackles, and no wheezes.   Heart:  normal rate, regular rhythm, and no murmur.   Pulses:  2+ radial Neurologic:  alert & oriented X3, cranial nerves II-XII intact, and gait normal.   Skin:  turgor normal and color normal.   Psych:  Oriented X3, good eye contact, and not anxious appearing.     Impression & Recommendations:  Problem # 1:  ASTHMA (ICD-493.90) Patient with possibly another bronchitic exacerbation of her asthma. She is not in respiratory distress at this time.  Plan - finish doxycycline           increase symbicort to 160/4.5           for need to use MDI or nebs more than 4 times a day she is adamantly instructed to call - early intervention will reduce the risk of intubation.   Her updated medication list for this problem includes:    Xopenex Hfa 45 Mcg/act Aero (Levalbuterol tartrate) .Marland Kitchen... 2 puffs four times a day as needed    Singulair 10 Mg Tabs (Montelukast sodium) .Marland Kitchen... Take 1 tablet by mouth once a day    Xopenex 1.25 Mg/24ml Nebu (Levalbuterol hcl) ..... Use as directed    Symbicort 80-4.5 Mcg/act Aero (Budesonide-formoterol fumarate) .Marland Kitchen... 2 puffs first thing  in am and 2 puffs again in pm about 12 hours later  Complete Medication List: 1)  Xopenex Hfa 45 Mcg/act Aero (Levalbuterol tartrate) .... 2 puffs four times a day as needed 2)   Singulair 10 Mg Tabs (Montelukast sodium) .... Take 1 tablet by mouth once a day 3)  Veramyst 27.5 Mcg/spray Susp (Fluticasone furoate) .... Take 2 sprays in each nostril once a day 4)  Epipen 2-pak 0.3 Mg/0.39ml (1:1000) Devi (Epinephrine hcl (anaphylaxis)) .... For severe allergic reaction 5)  Xopenex 1.25 Mg/36ml Nebu (Levalbuterol hcl) .... Use as directed 6)  Mucinex Dm 30-600 Mg Tb12 (Dextromethorphan-guaifenesin) .... Take 1 to 2 tablets by mouth every 4 hours as needed. 7)  Patanol 0.1 % Soln (Olopatadine hcl) .Marland Kitchen.. 1 qtt ou two times a day as needed allergic conjunctivitis 8)  Protonix 40 Mg Tbec (Pantoprazole sodium) .... Take  1 tablet by mouth two times a day 9)  Furosemide 40 Mg Tabs (Furosemide) .... Take 1 tablet by mouth once a day as needed. 10)  Tramadol Hcl 50 Mg Tabs (Tramadol hcl) .... Take 1 tablet by mouth every 4 hours as needed. 11)  Xyzal 5 Mg Tabs (Levocetirizine dihydrochloride) .... Take 1 tablet by mouth once a day 12)  Tussin Cough 100 Mg/37ml Syrp (Guaifenesin) .... Use as needed 13)  Tussionex Pennkinetic Er 8-10 Mg/78ml Lqcr (Chlorpheniramine-hydrocodone) .Marland Kitchen.. 1 teaspoonful  by mouth two times a day as needed 14)  Symbicort 80-4.5 Mcg/act Aero (Budesonide-formoterol fumarate) .... 2 puffs first thing  in am and 2 puffs again in pm about 12 hours later 15)  Terazol 7 0.4 % Crea (Terconazole) .... Use as directed 16)  Prednisone 40mg  Taper

## 2010-05-23 NOTE — Letter (Signed)
Summary: Results Letter  [facility]  [address] [place], [place] [address]   Phone: [phone]  Fax: [phone]        November 28, 2007 MRN: [id number]    [facility] [address] [place], [place]  [address]    Dear [name],  I have been unable to reach you by phone.  Dr Christella Hartigan would like you to have an office visit in 6-8 weeks please contact the office to schedule an appointment.  Call if you have any questions.  Thank you.       Sincerely,  Chales Abrahams CMA  This letter has been electronically signed by your physician.

## 2010-05-23 NOTE — Assessment & Plan Note (Signed)
Summary: POST HOSP PER DR LESCHBER/ASTHMA/NWS   Vital Signs:  Patient Profile:   45 Years Old Female Height:     64 inches Temp:     99.2 degrees F oral Pulse rate:   76 / minute BP sitting:   126 / 81  (right arm) Cuff size:   large  Vitals Entered By: Zackery Barefoot CMA (May 08, 2007 11:49 AM)                 PCP:  [facility]  Chief Complaint:  HOSPITAL FOLLOW UP/PRODUCTIVE COUGH.  History of Present Illness: Seen as new patient about one week ago. In the interval due to allergic exposure she had an asthmatic flare with a peak flow of 100, failed home nebs. She went to ER and was admitted Jan 10th PM and discharged on 12th. She now presents with new cough productive of green sputum with temp to 100 F at home.  Current Allergies: PHENERGAN Geoffry Paradise      Physical Exam  General:     alert, well-developed, well-nourished, well-hydrated, and overweight-appearing.   Lungs:     decreased breath sounds but no wheezing.    Impression & Recommendations:  Problem # 1:  ASTHMA (ICD-493.90) Recent flare, completing prednisone taper. She is doing better with peak flow of 275 today.   Plan: continue present regimen and steroid taper. Keep appointment with Dr. Maple Hudson 1/16.  Her updated medication list for this problem includes:    Symbicort 160-4.5 Mcg/act Aero (Budesonide-formoterol fumarate) ..... Inhale 2 puffs two times a day    Singulair 10 Mg Tabs (Montelukast sodium) .Marland Kitchen... Take 1 tablet by mouth once a day    Albuterol 90 Mcg/act Aers (Albuterol) ..... Inhale 2 puffs every four hours as needed    Xopenex 1.25 Mg/41ml Nebu (Levalbuterol hcl) ..... Use as directed   Problem # 2:  BRONCHITIS-ACUTE (ICD-466.0) Acute bronchitis in an asthmatic.  Plan: Avelox 400mg  once daily x 5.  Her updated medication list for this problem includes:    Symbicort 160-4.5 Mcg/act Aero (Budesonide-formoterol fumarate) ..... Inhale 2 puffs two times a day    Singulair 10 Mg Tabs  (Montelukast sodium) .Marland Kitchen... Take 1 tablet by mouth once a day    Albuterol 90 Mcg/act Aers (Albuterol) ..... Inhale 2 puffs every four hours as needed    Mucinex Dm 30-600 Mg Tb12 (Dextromethorphan-guaifenesin) .Marland Kitchen... Take 1 to 2 tablets by mouth every 4 hours as needed.    Tussionex Pennkinetic Er 8-10 Mg/45ml Lqcr (Chlorpheniramine-hydrocodone) .Marland Kitchen... Take 1 teaspoon by mouht every 12 hours as needed.    Xopenex 1.25 Mg/55ml Nebu (Levalbuterol hcl) ..... Use as directed    Avelox Abc Pack 400 Mg Tabs (Moxifloxacin hcl) .Marland Kitchen... 1 once daily x 5.   Complete Medication List: 1)  Symbicort 160-4.5 Mcg/act Aero (Budesonide-formoterol fumarate) .... Inhale 2 puffs two times a day 2)  Zyrtec Allergy 10 Mg Tabs (Cetirizine hcl) .... Take 1 tablet by mouth once a day at bedtime 3)  Protonix 40 Mg Tbec (Pantoprazole sodium) .... Take 1 tablet by mouth two times a day 4)  Singulair 10 Mg Tabs (Montelukast sodium) .... Take 1 tablet by mouth once a day 5)  Veramyst 27.5 Mcg/spray Susp (Fluticasone furoate) .... Take 2 sprays in each nostril once a day 6)  Furosemide 40 Mg Tabs (Furosemide) .... Take 1 tablet by mouth once a day as needed. 7)  Patanol 0.1 % Soln (Olopatadine hcl) .... Take 2 sprays in each nostril once  a day 8)  Epipen 0.3 Mg/0.67ml (1:1000) Devi (Epinephrine hcl (anaphylaxis)) .... Use as directed 9)  Albuterol 90 Mcg/act Aers (Albuterol) .... Inhale 2 puffs every four hours as needed 10)  Mucinex Dm 30-600 Mg Tb12 (Dextromethorphan-guaifenesin) .... Take 1 to 2 tablets by mouth every 4 hours as needed. 11)  Tramadol Hcl 50 Mg Tabs (Tramadol hcl) .... Take 1 tablet by mouth every 4 hours as needed. 12)  Tussionex Pennkinetic Er 8-10 Mg/39ml Lqcr (Chlorpheniramine-hydrocodone) .... Take 1 teaspoon by mouht every 12 hours as needed. 13)  Xopenex 1.25 Mg/45ml Nebu (Levalbuterol hcl) .... Use as directed 14)  Prednisone Dose Pack  .... As directed 15)  Avelox Abc Pack 400 Mg Tabs (Moxifloxacin hcl)  .Marland Kitchen.. 1 once daily x 5.     Prescriptions: AVELOX ABC PACK 400 MG  TABS (MOXIFLOXACIN HCL) 1 once daily x 5.  #5 x 0   Entered and Authorized by:   Jacques Navy MD   Signed by:   Jacques Navy MD on 05/08/2007   Method used:   Electronically sent to ...       [address]       [address]       [place], [place]  [address]       Ph: [phone]       Fax: [phone]   RxID:   [id number]  ]

## 2010-05-23 NOTE — Assessment & Plan Note (Signed)
History of Present Illness Visit Type: consult Primary GI MD: Rob Bunting MD Primary Provider: Illene Regulus, MD Requesting Provider: Illene Regulus, MD Chief Complaint: Patient here for f/u from abnormal barium swallow.  Patient still experiencing dysphagia. History of Present Illness:     Had MBS done last month for non-progressive, intermittent solid food dysphagia for at least 1-2 years.  Had severe  choking episode (passed out) in late March, 2009.  Heimlich maneuver helped.    No pyrosis.  Dr. Debby Bud set her up with a MBS a months ago.  Barium pill stuck.  we are trying to get official report sent over.     GI Review of Systems      Denies nausea, vomiting, weight loss, and  weight gain.           Prior Medications Reviewed Using: Patient Recall  Updated Prior Medication List: SYMBICORT 160-4.5 MCG/ACT  AERO (BUDESONIDE-FORMOTEROL FUMARATE) Inhale 2 puffs two times a day ZYRTEC ALLERGY 10 MG  TABS (CETIRIZINE HCL) Take 1 tablet by mouth once a day at bedtime PROTONIX 40 MG  TBEC (PANTOPRAZOLE SODIUM) Take 1 tablet by mouth two times a day SINGULAIR 10 MG  TABS (MONTELUKAST SODIUM) Take 1 tablet by mouth once a day VERAMYST 27.5 MCG/SPRAY  SUSP (FLUTICASONE FUROATE) Take 2 sprays in each nostril once a day FUROSEMIDE 40 MG  TABS (FUROSEMIDE) Take 1 tablet by mouth once a day as needed. PATANOL 0.1 %  SOLN (OLOPATADINE HCL) 1 qtt ou two times a day as needed allergic conjunctivitis EPIPEN 2-PAK 0.3 MG/0.3ML (1:1000)  DEVI (EPINEPHRINE HCL (ANAPHYLAXIS)) for severe allergic reaction ALBUTEROL 90 MCG/ACT  AERS (ALBUTEROL) Inhale 2 puffs every four hours as needed MUCINEX DM 30-600 MG  TB12 (DEXTROMETHORPHAN-GUAIFENESIN) Take 1 to 2 tablets by mouth every 4 hours as needed. TRAMADOL HCL 50 MG  TABS (TRAMADOL HCL) Take 1 tablet by mouth every 4 hours as needed. XOPENEX 1.25 MG/3ML  NEBU (LEVALBUTEROL HCL) Use as directed  Current Allergies (reviewed  today): PHENERGAN Geoffry Paradise  Past Medical History:    Reviewed history from 05/09/2007 and no changes required:       UCD       preeclampsia       pseudotumor cerebri - has required LP for release of pressure       Allergic rhinitis       Asthma-h/o intubation '01       GERD                     Physician Roster:                       Pul/allergy - Claudell Kyle         Past Surgical History:    Reviewed history from 04/30/2007 and no changes required:       Tonsillectomy       uterine ablation for metorrhagia/fibroids   Family History:    Reviewed history from 04/30/2007 and no changes required:       father -died 81, CVA, HTN       mother - '52;  CVA, HTN       Neg- breast, colon cancer; CAD       DM - grandmother and aunt  Social History:    Reviewed history from 04/30/2007  and no changes required:       [employer]       married '94       1 son '88 in college [employer]       Work: Charity fundraiser at [employer]       SO - good health       marriage good health       no hx/o abuse    Review of Systems       Pertinent positive and negative review of systems were noted in the above HPI and GI specific review of systems.  All other review of systems was otherwise negative.    Vital Signs:  Patient Profile:   45 Years Old Female Height:     64 inches Weight:      242.8 pounds BMI:     41.83 BSA:     2.13 Pulse rate:   76 / minute Pulse rhythm:   regular BP sitting:   130 / 78  (left arm)  Vitals Entered By: Hortense Ramal CMA (November 12, 2007 9:42 AM)                  Physical Exam  Constitutional: obese otherwise generally well appearing Psychiatric: alert and oriented times 3 Eyes: extraocular movements intact Mouth: oropharynx moist, no lesions Neck: supple, no lymphadenopathy Cardiovascular: heart regular rate and rythm Lungs: CTA bilaterally Abdomen: soft, non-tender, non-distended, no obvious ascites, no peritoneal signs, normal  bowel sounds Extremities: no lower extremity edema bilaterally Skin: no lesions on visible extremities     Impression & Recommendations:  Problem # 1:  DYSPHAGIA (ICD-787.29) intermittent solid food dysphasia that is not progressive is usually benign process. Possibly peptic stricturing, possibly a ring. I will arrange for her to have an EGD performed at her soonest convenience with likely balloon dilation.  she will also get a basic set of labs including a CBC and a complete metabolic profile Orders: TLB-CBC Platelet - w/Differential (85025-CBCD) TLB-CMP (Comprehensive Metabolic Pnl) (80053-COMP) ZEGD Balloon Dil (ZEGD Balloon)   Problem # 2:  ABNORMAL FINDINGS GI TRACT (ICD-793.4) we are working to get the modified barium swallow report sent over but it sounds fairly clear that pill was stuck in her esophagus causing the same symptoms that she is intermittently bothered by. Orders: TLB-CBC Platelet - w/Differential (85025-CBCD) TLB-CMP (Comprehensive Metabolic Pnl) (80053-COMP) ZEGD Balloon Dil (ZEGD Balloon)    Patient Instructions: 1)  You will be scheduled to have an uppper endoscopy with balloon dilation.   2)  A copy of this information will be sent to Dr. Debby Bud. 3)  You will get lab test(s) done (CBC, cmet). 4)  Chew food very well.    ]

## 2010-05-23 NOTE — Progress Notes (Signed)
Summary: diflucan  Phone Note Call from Patient   Call For: Gina Lantier,NP Summary of Call: Pt c/o vaginal yeast infection due to antibiotics prescribed by Dr.Wert on 05/02/2009 would like pill and cream called to pharmacy Initial call taken by: Jerolyn Shin,  May 12, 2009 5:26 PM  Follow-up for Phone Call        will forward to TP to address tomorrow. Boone Master CNA  May 12, 2009 5:32 PM   Additional Follow-up for Phone Call Additional follow up Details #1::        Diflucan 100mg  #8 2 by mouth first day then 1 daily till gone no refills Please contact office for sooner follow up if symptoms do not improve or worsen  Additional Follow-up by: Rubye Oaks NP,  May 13, 2009 9:51 AM    Additional Follow-up for Phone Call Additional follow up Details #2::    pt also request to have a cream called in as well like terazole 7. Pt states yeast infection is really bad. Please advise. Carron Curie CMA  May 13, 2009 10:03 AM  [facility] yeast cream are otc, do not need rx Follow-up by: Rubye Oaks NP,  May 13, 2009 10:28 AM  Additional Follow-up for Phone Call Additional follow up Details #3:: Details for Additional Follow-up Action Taken: LM with coworker TCB Vernie Murders  May 13, 2009 10:40 AM  Spoke with pt and advised that she try diflucan and OTC cream.  She states that already tried monistat cream without refief and insists that she does need a prescription cream called in.  Please advise, thanks! Vernie Murders  May 13, 2009 10:52 AM  per TP:  cream sent to pharmacy.  next time request should go to [facility] for review. LM w/ coworker TCB. Boone Master CNA  May 13, 2009 11:08 AM   diflucan also called in. Carron Curie CMA  May 13, 2009 11:15 AM   New/Updated Medications: TERAZOL 7 0.4 % CREA (TERCONAZOLE) Use as directed DIFLUCAN 100 MG TABS (FLUCONAZOLE) 2 by mouth first day and then one daily until  gone. Prescriptions: DIFLUCAN 100 MG TABS (FLUCONAZOLE) 2 by mouth first day and then one daily until gone.  #8 x 0   Entered by:   Carron Curie CMA   Authorized by:   Rubye Oaks NP   Signed by:   Carron Curie CMA on 05/13/2009   Method used:   Electronically to        [facility]* (retail)       [address]       [address] [place] [address]       [place], [place]  [address]       Ph: [phone]       Fax: [phone]   RxID:   [id number] TERAZOL 7 0.4 % CREA (TERCONAZOLE) Use as directed  #1 x 0   Entered by:   Boone Master CNA   Authorized by:   Rubye Oaks NP   Signed by:   Boone Master CNA on 05/13/2009   Method used:   Electronically to        [facility]* (retail)       [address]       [address] [place] [address]       [place], [place]  [address]       Ph: [phone]       Fax: [phone]   RxID:   [id number]

## 2010-05-23 NOTE — Assessment & Plan Note (Signed)
Summary: ASTHMA/NWS  MEN'S PT   Vital Signs:  Patient profile:   45 year old female Height:      63 inches (160.02 cm) Weight:      203 pounds (92.27 kg) BMI:     36.09 O2 Sat:      97 % on Room air Temp:     99.7 degrees F (37.61 degrees C) oral Pulse rate:   88 / minute BP sitting:   160 / 100  (left arm) Cuff size:   large  Vitals Entered By: Orlan Leavens RMA (March 22, 2010 2:59 PM)  O2 Flow:  Room air CC: Asthma Is Patient Diabetic? No Pain Assessment Patient in pain? no        Primary Care Provider:  Illene Regulus, MD  CC:  Asthma.  History of Present Illness: here with asthma flare - onset breathing problems 4 days ago - precipitated by URI +productive cough and nasal congestion +wheeze, worse with any exertion reports compliance with ongoing medical treatment and no changes in medication dose or frequency. denies adverse side effects related to current therapy.   Current Medications (verified): 1)  Xopenex Hfa 45 Mcg/act Aero (Levalbuterol Tartrate) .... 2 Puffs Four Times A Day As Needed 2)  Singulair 10 Mg  Tabs (Montelukast Sodium) .... Take 1 Tablet By Mouth Once A Day 3)  Veramyst 27.5 Mcg/spray  Susp (Fluticasone Furoate) .... Take 2 Sprays in Each Nostril Once A Day 4)  Epipen 2-Pak 0.3 Mg/0.40ml (1:1000)  Devi (Epinephrine Hcl (Anaphylaxis)) .... For Severe Allergic Reaction 5)  Xopenex 1.25 Mg/40ml  Nebu (Levalbuterol Hcl) .... Use As Directed 6)  Mucinex Dm 30-600 Mg  Tb12 (Dextromethorphan-Guaifenesin) .... Take 1 To 2 Tablets By Mouth Every 4 Hours As Needed. 7)  Patanol 0.1 %  Soln (Olopatadine Hcl) .Marland Kitchen.. 1 Qtt Ou Two Times A Day As Needed Allergic Conjunctivitis 8)  Protonix 40 Mg  Tbec (Pantoprazole Sodium) .... Take 1 Tablet By Mouth Two Times A Day 9)  Furosemide 40 Mg  Tabs (Furosemide) .... Take 1 Tablet By Mouth Once A Day As Needed. 10)  Tramadol Hcl 50 Mg  Tabs (Tramadol Hcl) .... Take 1 Tablet By Mouth Every 4 Hours As Needed. 11)   Xyzal 5 Mg Tabs (Levocetirizine Dihydrochloride) .... Take 1 Tablet By Mouth Once A Day 12)  Tussin Cough 100 Mg/86ml Syrp (Guaifenesin) .... Use As Needed 13)  Tussionex Pennkinetic Er 8-10 Mg/72ml Lqcr (Chlorpheniramine-Hydrocodone) .Marland Kitchen.. 1 Teaspoonful  By Mouth Two Times A Day As Needed 14)  Symbicort 80-4.5 Mcg/act  Aero (Budesonide-Formoterol Fumarate) .... 2 Puffs First Thing  in Am and 2 Puffs Again in Pm About 12 Hours Later 15)  Terazol 7 0.4 % Crea (Terconazole) .... Use As Directed  Allergies (verified): 1)  ! 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    Physical Exam  General:  overweight AA  female in mild-mod resp distress at rest  Ears:  normal pinnae bilaterally, without erythema, swelling, or tenderness to palpation. TMs clear, without effusion, or cerumen impaction. Hearing grossly normal bilaterally  Mouth:  teeth and gums in good repair; mucous membranes moist, without lesions or ulcers. oropharynx clear without exudate, no erythema.  Lungs:  tight with diminished air mvmt B bases - + insp and exp wheeze before neb tx Heart:  normal rate, regular rhythm, no murmur, and no rub. BLE without edema.  Neurologic:  alert & oriented X3 and cranial nerves II-XII symetrically intact.  strength normal in all extremities, sensation intact to light touch, and gait normal. speech fluent without dysarthria or aphasia; follows commands  with good comprehension.  Psych:  Oriented X3, memory intact for recent and remote, normally interactive, good eye contact, not anxious appearing, not depressed appearing, and not agitated.      Impression & Recommendations:  Problem # 1:  ASTHMA, WITH ACUTE EXACERBATION (ICD-493.92)  alb neb and depo 120 now - zpak, pred pak and tussionex atop usual asthma meds - pt to f/u pulm as planned, go to er if worse in next 48h Her updated medication list for this problem includes:    Xopenex Hfa 45 Mcg/act Aero (Levalbuterol tartrate) .Marland Kitchen... 2 puffs four times a day as needed    Singulair 10 Mg Tabs (Montelukast sodium) .Marland Kitchen... Take 1 tablet by mouth once a day    Xopenex 1.25 Mg/46ml Nebu (Levalbuterol hcl) ..... Use as directed    Symbicort 80-4.5 Mcg/act Aero (Budesonide-formoterol fumarate) .Marland Kitchen... 2 puffs first thing  in am and 2 puffs again in pm about 12 hours later    Prednisone (pak) 5 Mg Tabs (Prednisone) .Marland Kitchen... As directed x 12d  Pulmonary Functions Reviewed: O2 sat: 97 (03/22/2010)  Orders: Prescription Created Electronically [id number]) Depo- Medrol 80mg  (J1040) Depo- Medrol 40mg  (J1030) Admin of Therapeutic Inj  intramuscular or subcutaneous ([address]) Nebulizer Tx (72536)  Complete Medication List: 1)  Xopenex Hfa 45 Mcg/act Aero (Levalbuterol tartrate) .... 2 puffs four times a day as needed 2)  Singulair 10 Mg Tabs (Montelukast sodium) .... Take 1 tablet by mouth once a day 3)  Veramyst 27.5 Mcg/spray Susp (Fluticasone furoate) .... Take 2 sprays in each nostril once a day 4)  Epipen 2-pak 0.3 Mg/0.38ml (1:1000) Devi (Epinephrine hcl (anaphylaxis)) .... For severe allergic reaction 5)  Xopenex 1.25 Mg/25ml Nebu (Levalbuterol hcl) .... Use as directed 6)  Mucinex Dm 30-600 Mg Tb12 (Dextromethorphan-guaifenesin) .... Take 1 to 2 tablets by mouth every 4 hours as needed. 7)  Patanol 0.1 % Soln (Olopatadine hcl) .Marland Kitchen.. 1 qtt ou two times a day as needed allergic conjunctivitis 8)  Protonix 40  Mg Tbec (Pantoprazole sodium) .... Take 1 tablet by mouth two times a day 9)  Furosemide 40 Mg Tabs (Furosemide) .... Take 1 tablet by mouth once a day as needed. 10)  Tramadol Hcl 50 Mg Tabs (Tramadol hcl) .... Take 1 tablet by mouth every 4 hours as needed. 11)  Xyzal 5 Mg Tabs (Levocetirizine dihydrochloride) .... Take 1 tablet by mouth once a day 12)  Tussin Cough 100 Mg/15ml Syrp (Guaifenesin) .... Use as needed 13)  Tussionex Pennkinetic Er 8-10 Mg/59ml Lqcr (Chlorpheniramine-hydrocodone) .Marland Kitchen.. 1 teaspoonful  by mouth two times a day as needed 14)  Symbicort 80-4.5 Mcg/act Aero (Budesonide-formoterol fumarate) .... 2 puffs first thing  in am and 2 puffs again in pm about 12 hours later 15)  Terazol 7 0.4 % Crea (Terconazole) .Marland KitchenMarland KitchenMarland Kitchen  Use as directed 16)  Prednisone (pak) 5 Mg Tabs (Prednisone) .... As directed x 12d 17)  Azithromycin 250 Mg Tabs (Azithromycin) .... 2 tabs by mouth today, then 1 by mouth daily starting tomorrow  Patient Instructions: 1)  it was good to see you today. 2)  depomedrol and lab neb given in office today - 3)  Zpak, Pred pak and tussionex for asthma/cough symptoms - your prescriptions have been electronically submitted and/or faxed to your pharmacy. Please take as directed. Contact our office if you believe you're having problems with the medication(s).  4)  Get plenty of rest, drink lots of clear liquids, and use Tylenol or Ibuprofen for comfort. Return in 7-10 days if you're not better:sooner if you're feeling worse. 5)  if your symptoms continue to worsen (trouble breathing, pain, fever, etc), or if you are unable take anything by mouth (pills, fluids, etc), you should go to the emergency room for further evaluation and treatment.  6)  Please keep scheduled follow-up appointment with pulm as planned, call sooner if problems.  Prescriptions: TUSSIONEX PENNKINETIC ER 8-10 MG/5ML LQCR (CHLORPHENIRAMINE-HYDROCODONE) 1 teaspoonful  by mouth two times a day as needed  #6oz x  0   Entered and Authorized by:   Newt Lukes MD   Signed by:   Newt Lukes MD on 03/22/2010   Method used:   Printed then faxed to ...       [facility]* (retail)       [address]       [address]mailing       [place], [place]  [address]       Ph: [phone]       Fax: [phone]   RxID:   [id number] AZITHROMYCIN 250 MG TABS (AZITHROMYCIN) 2 tabs by mouth today, then 1 by mouth daily starting tomorrow  #6 x 0   Entered and Authorized by:   Newt Lukes MD   Signed by:   Newt Lukes MD on 03/22/2010   Method used:   Electronically to        [facility]* (retail)       [address]       [address]mailing       [place], [place]  [address]       Ph: [phone]       Fax: [phone]   RxID:   [id number] PREDNISONE (PAK) 5 MG TABS (PREDNISONE) as directed x 12d  #1 pak x 0   Entered and Authorized by:   Newt Lukes MD   Signed by:   Newt Lukes MD on 03/22/2010   Method used:   Electronically to        [facility]* (retail)       [address]       [address]mail[address]       [place], [place]  [address]       Ph: [phone]       Fax: [phone]   RxID:   [id number]    Medication Administration  Injection # 1:    Medication: Depo- Medrol 80mg     Diagnosis: ASTHMA, WITH ACUTE EXACERBATION (ICD-493.92)    Route: IM    Site: RUOQ gluteus    Exp Date: 07/2012    Lot #: [id number]    Mfr: [employer]    Comments: Gave total 120mg     Patient tolerated  injection without complications    Given by: Orlan Leavens RMA (March 22, 2010 3:37 PM)  Injection # 2:    Medication: Depo- Medrol 40mg     Diagnosis: ASTHMA, WITH ACUTE EXACERBATION (ICD-493.92)  Orders Added: 1)  Est. Patient Level IV [96295] 2)  Prescription Created Electronically [G8553] 3)  Depo- Medrol 80mg  [J1040] 4)  Depo- Medrol 40mg  [J1030] 5)  Admin of  Therapeutic Inj  intramuscular or subcutaneous [96372] 6)  Nebulizer Tx [28413]

## 2010-05-23 NOTE — Assessment & Plan Note (Signed)
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  Using Patanol has helped her eyes.  She has had GI workup with Dr. Christella Hartigan, on barium swallow pill hung midesophagus.  Needed rescue inhaler twice last week.  04/29/08- Asthma, allergic rhinitis        Fiance with her Working with Dr Christella Hartigan on esophagus- hasn't gotten choked lately. Denies feeling reflux or nasal congestion/ postnasal drip. Tussive soreness mid sternal. Feels as if about to come down with something, but denies purulent/ fever. Asking for Tussionex.          Prior Medications Reviewed Using: Patient Recall  Updated Prior Medication List: SYMBICORT 160-4.5 MCG/ACT  AERO (BUDESONIDE-FORMOTEROL FUMARATE) Inhale 2 puffs two times a day ZYRTEC ALLERGY 10 MG  TABS (CETIRIZINE HCL) Take 1 tablet by mouth once a day at bedtime PROTONIX 40 MG  TBEC (PANTOPRAZOLE SODIUM) Take 1 tablet by mouth two times a day SINGULAIR 10 MG  TABS (MONTELUKAST SODIUM) Take 1 tablet by mouth once a day VERAMYST 27.5 MCG/SPRAY  SUSP (FLUTICASONE FUROATE) Take 2 sprays in each nostril once a day FUROSEMIDE 40 MG  TABS (FUROSEMIDE) Take 1 tablet by mouth once a day as needed. PATANOL 0.1 %  SOLN  (OLOPATADINE HCL) 1 qtt ou two times a day as needed allergic conjunctivitis EPIPEN 2-PAK 0.3 MG/0.3ML (1:1000)  DEVI (EPINEPHRINE HCL (ANAPHYLAXIS)) for severe allergic reaction ALBUTEROL 90 MCG/ACT  AERS (ALBUTEROL) Inhale 2 puffs every four hours as needed MUCINEX DM 30-600 MG  TB12 (DEXTROMETHORPHAN-GUAIFENESIN) Take 1 to 2 tablets by mouth every 4 hours as needed. TRAMADOL HCL 50 MG  TABS (TRAMADOL HCL) Take 1 tablet by mouth every 4 hours as needed. XOPENEX 1.25 MG/3ML  NEBU (LEVALBUTEROL HCL) Use as directed  Current Allergies (reviewed today): ! * FLU VACCINATION PHENERGAN Geoffry Paradise  Past Medical History:    Reviewed history from 05/09/2007 and no changes required:       UCD       preeclampsia       pseudotumor cerebri - has required LP for release of pressure       Allergic rhinitis       Asthma-h/o intubation '01       GERD                     Physician Roster:                       Pul/allergy - Claudell Kyle          Social History:    Reviewed history from 04/30/2007 and no changes required:       [employer]       married '  28       1 son '88 in college [employer]       Work: Charity fundraiser at [employer]       SO - good health       marriage good health       no hx/o abuse    Review of Systems      See HPI       Denies headache, sinus drainage, sneezing chest pain, dyspnea, n/v/d, weight loss, fever, edema.     Vital Signs:  Patient Profile:   45 Years Old Female Height:     64 inches O2 Sat:      97 % O2 treatment:    Room Air Pulse rate:   105 / minute BP sitting:   124 / 82  (left arm) Cuff size:   large  Vitals Entered By: Reynaldo Minium CMA (April 29, 2008 10:58 AM)             Comments Medications reviewed with patient Reynaldo Minium CMA  April 29, 2008 10:58 AM      Physical Exam  General: A/Ox3; pleasant and cooperative, NAD, SKIN: no rash, lesions NODES: no lymphadenopathy HEENT: [place]/AT, EOM- WNL, Conjuctivae-  clear, PERRLA, TM-WNL, Nose- clear, sniffing, Throat- clear and wnl NECK: Supple w/ fair ROM, JVD- none, normal carotid impulses w/o bruits Thyroid- normal to palpation CHEST: Shallow, persistent dry cough with little wheeze HEART: RRR, no m/g/r heard ABDOMEN: Soft and nl; nml bowel sounds; no organomegaly or masses noted ZOX:WRUE, nl pulses, no edema  NEURO: Grossly intact to observation         Problem # 1:  ASTHMA (ICD-493.90) Sub acute exacerbation. Ongoing eval for GERD issues which may be important to respiratory function as well. discussed options. She can get neb treatments using a machine at work. Asks depo inj. Discussed steroids. Will let her hold script for abx and pred taper for as needed.  The following medications were removed from the medication list:    Prednisone 10 Mg Tabs (Prednisone) .Marland KitchenMarland KitchenMarland KitchenMarland Kitchen 4 a day x 2 days, 3 a day x 2 days, 2 a day x 2 days and then once daily x 2 days then stop  Her updated medication list for this problem includes:    Symbicort 160-4.5 Mcg/act Aero (Budesonide-formoterol fumarate) ..... Inhale 2 puffs two times a day    Albuterol 90 Mcg/act Aers (Albuterol) ..... Inhale 2 puffs every four hours as needed    Singulair 10 Mg Tabs (Montelukast sodium) .Marland Kitchen... Take 1 tablet by mouth once a day    Xopenex 1.25 Mg/20ml Nebu (Levalbuterol hcl) ..... Use as directed    Prednisone 10 Mg Tabs (Prednisone) .Marland Kitchen... 1 tab four times daily x 2 days, 3 times daily x 2 days, 2 times daily x 2 days, 1 time daily x 2 days   Problem # 2:  ALLERGIC RHINITIS (ICD-477.9) Continues steroid nasal spray, antihistamine. Her updated medication list for this problem includes:    Zyrtec Allergy 10 Mg Tabs (Cetirizine hcl) .Marland Kitchen... Take 1 tablet by mouth once a day at bedtime    Veramyst 27.5 Mcg/spray Susp (Fluticasone furoate) .Marland Kitchen... Take 2 sprays in each nostril once a day   Medications Added to Medication List This Visit: 1)  Prednisone 10 Mg Tabs (Prednisone) .Marland Kitchen.. 1 tab  four times daily x 2 days, 3 times daily x 2 days, 2 times daily x 2 days, 1 time daily x 2 days 2)  Zithromax Z-pak 250 Mg  Tabs (Azithromycin) .... 2 today then one daily 3)  Tussionex Pennkinetic Er 8-10 Mg/48ml Lqcr (Chlorpheniramine-hydrocodone) .Marland Kitchen.. 1 teaspoon twice dail as needed   Patient Instructions: 1)  Please schedule a follow-up appointment in 4 months. 2)  Depo 80 3)  Script for Tussionex to send to drug store 4)  Scripts for z pak and for prednisone taper 5)  Depo 80   Prescriptions: TUSSIONEX PENNKINETIC ER 8-10 MG/5ML LQCR (CHLORPHENIRAMINE-HYDROCODONE) 1 teaspoon twice dail as needed  #200 ml x 0   Entered and Authorized by:   Waymon Budge MD   Signed by:   Waymon Budge MD on 04/29/2008   Method used:   Printed then faxed to ...       [facility] [address] (retail)             [place], [place]         Ph: [phone]       Fax:    RxID:   [id number] ZITHROMAX Z-PAK 250 MG TABS (AZITHROMYCIN) 2 today then one daily  #1 pak x 0   Entered and Authorized by:   Waymon Budge MD   Signed by:   Waymon Budge MD on 04/29/2008   Method used:   Print then Give to Patient   RxID:   [id number] PREDNISONE 10 MG TABS (PREDNISONE) 1 tab four times daily x 2 days, 3 times daily x 2 days, 2 times daily x 2 days, 1 time daily x 2 days  #20 x 0   Entered and Authorized by:   Waymon Budge MD   Signed by:   Waymon Budge MD on 04/29/2008   Method used:   Print then Give to Patient   RxID:   [id number]  ]

## 2010-05-23 NOTE — Procedures (Signed)
Summary: EGD   EGD  Procedure date:  11/27/2007  Findings:      Location: [facility]    [facility] Patient Name: [name], Rawson MRN: [id number] Procedure Procedures: Panendoscopy (EGD) CPT: 43235.    with esophageal dilation. CPT: G9296129.  Personnel: Endoscopist: Rachael Fee, MD.  Referred By: Rosalyn Gess. Norins, MD.  Exam Location: Exam performed in Endoscopy [facility]. [facility]  Patient Consent: Procedure, Alternatives, Risks and Benefits discussed, consent obtained, from patient. Consent was obtained by the RN.  Indications Symptoms: Dysphagia.  History  Current Medications: Patient is not currently taking Coumadin.  Comments: Patient history reviewed/updated, physical exam performed prior to initiation of sedation? YES Pre-Exam Physical: Performed Nov 27, 2007  Cardio-pulmonary exam, Abdominal exam, Mental status exam WNL.  Comments: Pt. history reviewed/updated, physical exam performed prior to initiation of sedation? YES Exam Exam Info: Maximum depth of insertion Duodenum, intended Duodenum. Patient position: on left side. Gastric retroflexion performed. Images taken. ASA Classification: II. Tolerance: good.  Sedation Meds: Patient assessed and found to be appropriate for moderate (conscious) sedation. Fentanyl 100 mcg. given IV. Versed 10 mg. given IV.  Monitoring: BP and pulse monitoring done. Oximetry used. Supplemental O2 given  Findings - Normal: Proximal Esophagus to Duodenal 2nd Portion. Comments: OTHERWISE NORMAL EXAMINATION.  OTHER FINDING: SMOOTH, BUT SOMEWHAT SNUG GE JUNCTION.  THIS WAS DILATED WITH A CRE BALLOON HELD INFLATED FOR 1 MINUTE. in Distal Esophagus.   Assessment: SNUG, BUT OTHERWISE NORMAL GE JUNCTION (NO ESOPHAGITIS, NO RINGS OR STRICTURES).  THIS WAS DILATED TO .  MY OFFICE WILL ARRANGE FOR VISIT IN 6-8 WEEKS AND IF HER DYSPHAGIA IS NOT IMPROVED, SHE WILL NEED ESOPHAGEAL MANOMETRY TO CHECK FOR SIGNS  OF ACHALASIA.  Appended Document: EGD NEEDS ROV WITH ME IN 6-8 WEEKS  Appended Document: EGD unable to reach by phone letter mailed

## 2010-05-23 NOTE — Progress Notes (Signed)
Summary: nos appt  Phone Note Call from Patient   Caller: [email or web address]  Call For: young Summary of Call: Rsc nos from 9/7 to 9/30 @ 11a. Initial call taken by: Darletta Moll,  December 29, 2009 10:39 AM

## 2010-05-23 NOTE — Progress Notes (Signed)
Summary: flu shot  Phone Note Call from Patient Call back at [phone]   Caller: Patient Call For: young Reason for Call: Talk to Nurse Summary of Call: pt would like a note so she doesn't have to take the flu shot.  She states every time she takes it she gets sick. Initial call taken by: Eugene Gavia,  January 06, 2008 10:18 AM  Follow-up for Phone Call        Note is ready to pick up at front.   Follow-up by: Waymon Budge MD,  January 06, 2008 5:17 PM  Additional Follow-up for Phone Call Additional follow up Details #1::        Spoke with pt and made aware Additional Follow-up by: Vernie Murders,  January 06, 2008 5:23 PM

## 2010-05-23 NOTE — Progress Notes (Signed)
Summary: Asthma attack  Phone Note Call from Patient Call back at Home Phone [phone]   Reason for Call: Acute Illness Complaint: Breathing Problems Action Taken: Patient advised to go to ER Details for Reason: Acute short of breath Summary of Call: The patient called me on Saturday night and was acutely SOB and unable to speak in sentences.  I advised her to go to the ED.  Later the [facility] EDP advised he was treating the pt and if she did not break, he was admitting her to [facility] primary [facility].  I do not know if that happened.  If she was not admitted, she will need to be seen in the office THIS WEEK by Dr Maple Hudson. Initial call taken by: Storm Frisk MD,  May 04, 2007 7:08 PM  Follow-up for Phone Call        Called pt to advise her she needed ov.  She has already been admitted to hospital and she will call to schedule ov with Dr. Maple Hudson  when she is discharged. Follow-up by: Vernie Murders,  May 05, 2007 8:42 AM

## 2010-05-23 NOTE — Assessment & Plan Note (Signed)
Summary: 3 months/apc   Visit Type:  Follow-up PCP:  Norins  Chief Complaint:  3 month follow up .  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  Using Patanol has helped her eyes.  She has had GI workup with Dr. Christella Hartigan, on barium swallow pill hung midesophagus.  Needed rescue inhaler twice last week.       Prior Medications Reviewed Using: Patient Recall  Updated Prior Medication List: SYMBICORT 160-4.5 MCG/ACT  AERO (BUDESONIDE-FORMOTEROL FUMARATE) Inhale 2 puffs two times a day ZYRTEC ALLERGY 10 MG  TABS (CETIRIZINE HCL) Take 1 tablet by mouth once a day at bedtime PROTONIX 40 MG  TBEC (PANTOPRAZOLE SODIUM) Take 1 tablet by mouth two times a day SINGULAIR 10 MG  TABS (MONTELUKAST SODIUM) Take 1 tablet by mouth once a day VERAMYST 27.5 MCG/SPRAY  SUSP (FLUTICASONE FUROATE) Take 2 sprays in each nostril once a day FUROSEMIDE 40 MG  TABS (FUROSEMIDE) Take 1 tablet by mouth once a day as needed. PATANOL 0.1 %  SOLN (OLOPATADINE HCL) 1 qtt ou two times a day as needed allergic conjunctivitis EPIPEN 2-PAK 0.3 MG/0.3ML (1:1000)  DEVI (EPINEPHRINE HCL (ANAPHYLAXIS)) for severe allergic reaction ALBUTEROL 90 MCG/ACT  AERS (ALBUTEROL) Inhale 2 puffs every four hours as needed MUCINEX DM 30-600 MG  TB12 (DEXTROMETHORPHAN-GUAIFENESIN) Take 1 to 2 tablets by mouth every 4 hours as needed. TRAMADOL HCL 50 MG  TABS (TRAMADOL HCL) Take 1 tablet by  mouth every 4 hours as needed. XOPENEX 1.25 MG/3ML  NEBU (LEVALBUTEROL HCL) Use as directed  Current Allergies (reviewed today): PHENERGAN Geoffry Paradise  Past Surgical History:    Reviewed history from 04/30/2007 and no changes required:       Tonsillectomy       uterine ablation for metorrhagia/fibroids     Review of Systems      See HPI   Vital Signs:  Patient Profile:   45 Years Old Female Height:     64 inches Weight:      232 pounds O2 Sat:      100 % O2 treatment:    Room Air Pulse rate:   72 / minute BP sitting:   128 / 86  (left arm) Cuff size:   regular  Vitals Entered By: Reynaldo Minium CMA (October 28, 2007 10:46 AM)                 Physical Exam  GENERAL:  A/Ox3; pleasant & cooperative.NAD HEENT:  [place]/AT, EOM-wnl, PERRLA, EACs-clear, TMs-wnl, NOSE-clear, THROAT-clear & wnl., mucosa is not red NECK:  Supple w/ fair ROM; no JVD; normal carotid impulses w/o bruits; no thyromegaly or nodules palpated; no lymphadenopathy. CHEST: Clear to P&A HEART:  RRR, no m/r/g  heard ABDOMEN:  Soft & nt; nml bowel sounds; no organomegaly or masses detected. EXT: Warm bilat,  no calf pain, edema, clubbing, pulses intact Skin: no rash/lesion        Problem # 1:  GERD (ICD-530.81) Assessment: Improved girdle with evidence of esophageal stricture, and history of aspiration.  She will  follow-up with GI.  We have discussed relationship between reflux and respiratory symptoms. Her updated medication list for this problem includes:    Symbicort 160-4.5 Mcg/act Aero (Budesonide-formoterol fumarate) ..... Inhale 2 puffs two times a day    Singulair 10 Mg Tabs (Montelukast sodium) .Marland Kitchen... Take 1 tablet by mouth once a day    Albuterol 90 Mcg/act Aers (Albuterol) ..... Inhale 2 puffs every four hours as needed    Xopenex 1.25 Mg/59ml Nebu (Levalbuterol hcl) ..... Use as directed   Problem # 2:  ALLERGIC RHINITIS (ICD-477.9) Assessment: Improved  Her updated medication list for this  problem includes:    Zyrtec Allergy 10 Mg Tabs (Cetirizine hcl) .Marland Kitchen... Take 1 tablet by mouth once a day at bedtime    Veramyst 27.5 Mcg/spray Susp (Fluticasone furoate) .Marland Kitchen... Take 2 sprays in each nostril once a day    Patient Instructions: 1)  Please schedule a follow-up appointment in 6 months.   ]

## 2010-05-23 NOTE — Letter (Signed)
Summary: pt note about EGD rsults/[facility]  pt note about EGD rsults/[facility]   Imported By: Lester [place] 12/23/2007 11:24:58  _____________________________________________________________________  External Attachment:    Type:   Image     Comment:   External Document

## 2010-05-23 NOTE — Assessment & Plan Note (Signed)
Summary: OFFICE VISIT   Vital Signs:  Patient Profile:   45 Years Old Female O2 Sat:      99 % Pulse rate:   88 / minute BP sitting:   130 / 88  (left arm)  Vitals Entered By: Reynaldo Minium (March 27, 2007 9:46 AM) Oxygen therapy Room Air                 Current Allergies: PHENERGAN Geoffry Paradise           ]
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
## 2010-05-23 NOTE — Assessment & Plan Note (Signed)
Summary: PER MD SCHED-PT TO BE HERE @ 4P-STC   Vital Signs:  Patient Profile:   45 Years Old Female Height:     64 inches Weight:      164 pounds BMI:     28.25 Pulse rate:   84 / minute BP sitting:   142 / 88  (left arm) Cuff size:   large  Vitals Entered By: Zackery Barefoot CMA (July 23, 2007 4:35 PM)                 PCP:  [facility]  Chief Complaint:  Follow up.  History of Present Illness: patient reports an episode last Sunday: she was seated, eating, and had a loss of consciousness with urinary incontinence. From family she learned that she apparently choked, lost consciousness, her son performed a Hiemlich manuever forcing out retained food and she regained consciousness.   Since this episode she has been afraid to eat or drink anything. She has taken small amounts of fluid without difficulty.  She had no reported seizure like movement, was not post-ictal, has no neurologic sequelae. She has bad allergies and asthma but has not had a flare of her asthma. Her allergies haven 't flared.    Updated Prior Medication List: SYMBICORT 160-4.5 MCG/ACT  AERO (BUDESONIDE-FORMOTEROL FUMARATE) Inhale 2 puffs two times a day ZYRTEC ALLERGY 10 MG  TABS (CETIRIZINE HCL) Take 1 tablet by mouth once a day at bedtime PROTONIX 40 MG  TBEC (PANTOPRAZOLE SODIUM) Take 1 tablet by mouth two times a day SINGULAIR 10 MG  TABS (MONTELUKAST SODIUM) Take 1 tablet by mouth once a day VERAMYST 27.5 MCG/SPRAY  SUSP (FLUTICASONE FUROATE) Take 2 sprays in each nostril once a day FUROSEMIDE 40 MG  TABS (FUROSEMIDE) Take 1 tablet by mouth once a day as needed. PATANOL 0.1 %  SOLN (OLOPATADINE HCL) Take 2 sprays in each nostril once a day EPIPEN 0.3 MG/0.3ML (1:1000)  DEVI (EPINEPHRINE HCL (ANAPHYLAXIS)) Use as directed ALBUTEROL 90 MCG/ACT  AERS (ALBUTEROL) Inhale 2 puffs every four hours as needed MUCINEX DM 30-600 MG  TB12 (DEXTROMETHORPHAN-GUAIFENESIN) Take 1 to 2 tablets by mouth every 4 hours as  needed. TRAMADOL HCL 50 MG  TABS (TRAMADOL HCL) Take 1 tablet by mouth every 4 hours as needed. TUSSIONEX PENNKINETIC ER 8-10 MG/5ML  LQCR (CHLORPHENIRAMINE-HYDROCODONE) Take 1 teaspoon by mouht every 12 hours as needed. XOPENEX 1.25 MG/3ML  NEBU (LEVALBUTEROL HCL) Use as directed OMNICEF 300 MG  CAPS (CEFDINIR) Take two tabs once daily x 7 days  Current Allergies: PHENERGAN * XOLAIR     Review of Systems  The patient denies anorexia, fever, weight loss, chest pain, dyspnea on exhertion, abdominal pain, muscle weakness, difficulty walking, and angioedema.     Physical Exam  General:     alert, well-developed, well-nourished, well-hydrated, and overweight-appearing.   Head:     normocephalic and atraumatic.   Neck:     supple.   Lungs:     normal respiratory effort, no intercostal retractions, no accessory muscle use, normal breath sounds, no dullness, no crackles, and no wheezes.   Heart:     normal rate and regular rhythm.   Neurologic:     alert & oriented X3, cranial nerves II-XII intact, strength normal in all extremities, and gait normal.      Impression & Recommendations:  Problem # 1:  FOREIGN BODY IN RESPIRATORY TREE UNSPECIFIED (ICD-934.9) By history and symptoms she had food aspiration with respiratory trouble and syncope. She made  a full recovery. She has been able to swallow.  Plan: she should advance her diet to soft foods and then regular. She should watch for coughing with eating and if this develops we will need to move forward with BaS.  Complete Medication List: 1)  Symbicort 160-4.5 Mcg/act Aero (Budesonide-formoterol fumarate) .... Inhale 2 puffs two times a day 2)  Zyrtec Allergy 10 Mg Tabs (Cetirizine hcl) .... Take 1 tablet by mouth once a day at bedtime 3)  Protonix 40 Mg Tbec (Pantoprazole sodium) .... Take 1 tablet by mouth two times a day 4)  Singulair 10 Mg Tabs (Montelukast sodium) .... Take 1 tablet by mouth once a day 5)  Veramyst 27.5  Mcg/spray Susp (Fluticasone furoate) .... Take 2 sprays in each nostril once a day 6)  Furosemide 40 Mg Tabs (Furosemide) .... Take 1 tablet by mouth once a day as needed. 7)  Patanol 0.1 % Soln (Olopatadine hcl) .... Take 2 sprays in each nostril once a day 8)  Epipen 0.3 Mg/0.4ml (1:1000) Devi (Epinephrine hcl (anaphylaxis)) .... Use as directed 9)  Albuterol 90 Mcg/act Aers (Albuterol) .... Inhale 2 puffs every four hours as needed 10)  Mucinex Dm 30-600 Mg Tb12 (Dextromethorphan-guaifenesin) .... Take 1 to 2 tablets by mouth every 4 hours as needed. 11)  Tramadol Hcl 50 Mg Tabs (Tramadol hcl) .... Take 1 tablet by mouth every 4 hours as needed. 12)  Tussionex Pennkinetic Er 8-10 Mg/51ml Lqcr (Chlorpheniramine-hydrocodone) .... Take 1 teaspoon by mouht every 12 hours as needed. 13)  Xopenex 1.25 Mg/55ml Nebu (Levalbuterol hcl) .... Use as directed 14)  Omnicef 300 Mg Caps (Cefdinir) .... Take two tabs once daily x 7 days     ]

## 2010-05-23 NOTE — Assessment & Plan Note (Signed)
Summary: hosp fu/tfw   PCP:  [facility]  Chief Complaint:  post-hospital follow-up.  History of Present Illness: ASTHMA  DC from hosp after asthma flare- see summary. Saw Dr Debby Bud yest and put on avelox. Continues pred taper. Sputum still green. Hoarsenes getting better, Sternum hurts from coughing, denies heartburn. Blames smell of tobacco on coworkers clothes as the trigger- denies recent cold. Peak flow day of admit was only 100 ( personal best is 400).   Current Allergies: PHENERGAN Geoffry Paradise  Past Medical History:    Reviewed history from 04/30/2007 and no changes required:       UCD       preeclampsia       pseudotumor cerebri - has required LP for release of pressure       Allergic rhinitis       Asthma-h/o intubation '01       GERD                     Physician Roster:                       Pul/allergy - Claudell Kyle         Past Surgical History:    Reviewed history from 04/30/2007 and no changes required:       Tonsillectomy       uterine ablation for metorrhagia/fibroids     Review of Systems      See HPI   Vital Signs:  Patient Profile:   45 Years Old Female Height:     64 inches O2 Sat:      99 % O2 treatment:    Room Air Pulse rate:   90 / minute BP sitting:   130 / 74  (left arm)  Vitals Entered By: Cloyde Reams RN (May 09, 2007 11:45 AM)             Comments Pt here today for a post-hospital follow-up visit. Pt was in [facility] 05/03/07-05/05/07. Breathing better since discharge.  Saw Dr Debby Bud 05/08/07 dx with bronchitis.  Started pt on Avelox x 5days. Currently on prednisone taper at 40mg  once daily. Medications reviewed      Physical Exam  General:     obese.  Looks subdued/ tired  Head:     normocephalic and atraumatic Eyes:     PERRLA/EOM intact; conjunctiva and sclera clear Nose:     clear nasal discharge.   Mouth:     throat injected.   Neck:     no masses, thyromegaly, or abnormal cervical  nodes Chest Wall:     no deformities noted Lungs:     lungs almost clear minor dry cough on deep breath Heart:     regular rate and rhythm, S1, S2 without murmurs, rubs, gallops, or clicks Extremities:     no clubbing, cyanosis, edema, or deformity noted Skin:     intact without lesions or rashes Cervical Nodes:     no significant adenopathy     Impression & Recommendations:  Problem # 1:  BRONCHITIS-ACUTE (ICD-466.0) Acute asthmatic bronchitis resolving but with potential to be severe. Counseling, med talk done. agreed to refill tussioneex Her updated medication list for this problem includes:    Symbicort 160-4.5 Mcg/act Aero (Budesonide-formoterol fumarate) ..... Inhale 2 puffs two times a day  Singulair 10 Mg Tabs (Montelukast sodium) .Marland Kitchen... Take 1 tablet by mouth once a day    Albuterol 90 Mcg/act Aers (Albuterol) ..... Inhale 2 puffs every four hours as needed    Mucinex Dm 30-600 Mg Tb12 (Dextromethorphan-guaifenesin) .Marland Kitchen... Take 1 to 2 tablets by mouth every 4 hours as needed.    Tussionex Pennkinetic Er 8-10 Mg/51ml Lqcr (Chlorpheniramine-hydrocodone) .Marland Kitchen... Take 1 teaspoon by mouht every 12 hours as needed.    Xopenex 1.25 Mg/29ml Nebu (Levalbuterol hcl) ..... Use as directed    Avelox Abc Pack 400 Mg Tabs (Moxifloxacin hcl) .Marland Kitchen... 1 once daily x 5.  Orders: Est. Patient Level III (94854)   Problem # 2:  ALLERGIC RHINITIS (ICD-477.9) watch for seasonal flare this Spring. Her updated medication list for this problem includes:    Zyrtec Allergy 10 Mg Tabs (Cetirizine hcl) .Marland Kitchen... Take 1 tablet by mouth once a day at bedtime    Veramyst 27.5 Mcg/spray Susp (Fluticasone furoate) .Marland Kitchen... Take 2 sprays in each nostril once a day  Orders: Est. Patient Level III (62703)   Problem # 3:  GERD (ICD-530.81) maintain reflux precautions- emphasized. Her updated medication list for this problem includes:    Protonix 40 Mg Tbec (Pantoprazole sodium) .Marland Kitchen... Take 1 tablet by mouth two  times a day    Patient Instructions: 1)  Keep apt- earlier if needed 2)  Tussionex- use sparingly    Prescriptions: TUSSIONEX PENNKINETIC ER 8-10 MG/5ML  LQCR (CHLORPHENIRAMINE-HYDROCODONE) Take 1 teaspoon by mouht every 12 hours as needed.  #300 ml x 0   Entered and Authorized by:   Waymon Budge MD   Signed by:   Waymon Budge MD on 05/09/2007   Method used:   Print then Give to Patient   RxID:   [id number]  ]

## 2010-05-23 NOTE — Assessment & Plan Note (Signed)
Summary: PER PT MD SAID EARLY AM APPT--POST HOSP--STC   Vital Signs:  Patient profile:   45 year old female Height:      63 inches Weight:      203 pounds BMI:     36.09 O2 Sat:      98 % on Room air Temp:     98.1 degrees F oral Pulse rate:   71 / minute BP sitting:   148 / 92  (left arm) Cuff size:   large  Vitals Entered By: Bill Salinas CMA (June 08, 2009 9:19 AM)  O2 Flow:  Room air CC: pt here for hosp follow up/ ab   Primary Care Provider:  Illene Regulus, MD  CC:  pt here for hosp follow up/ ab.  History of Present Illness: Patient with recent hospitalization for exacerbation of asthama requiring IV solumedrol. She was discharged on Sunday. Since discharge she has been doing OK. She has been on oral prednisone and today is at 30mg  two times a day. she is not wheezing. she reports her peak flow is 370 with a personal best of 400 and her low less than 200 at last hospitalization. she is using tussionex and her cough is much better. She did finish a  course of doxcycline.   Current Medications (verified): 1)  Xopenex Hfa 45 Mcg/act Aero (Levalbuterol Tartrate) .... 2 Puffs Four Times A Day As Needed 2)  Singulair 10 Mg  Tabs (Montelukast Sodium) .... Take 1 Tablet By Mouth Once A Day 3)  Veramyst 27.5 Mcg/spray  Susp (Fluticasone Furoate) .... Take 2 Sprays in Each Nostril Once A Day 4)  Epipen 2-Pak 0.3 Mg/0.11ml (1:1000)  Devi (Epinephrine Hcl (Anaphylaxis)) .... For Severe Allergic Reaction 5)  Xopenex 1.25 Mg/58ml  Nebu (Levalbuterol Hcl) .... Use As Directed 6)  Mucinex Dm 30-600 Mg  Tb12 (Dextromethorphan-Guaifenesin) .... Take 1 To 2 Tablets By Mouth Every 4 Hours As Needed. 7)  Patanol 0.1 %  Soln (Olopatadine Hcl) .Marland Kitchen.. 1 Qtt Ou Two Times A Day As Needed Allergic Conjunctivitis 8)  Protonix 40 Mg  Tbec (Pantoprazole Sodium) .... Take 1 Tablet By Mouth Two Times A Day 9)  Furosemide 40 Mg  Tabs (Furosemide) .... Take 1 Tablet By Mouth Once A Day As Needed. 10)   Tramadol Hcl 50 Mg  Tabs (Tramadol Hcl) .... Take 1 Tablet By Mouth Every 4 Hours As Needed. 11)  Xyzal 5 Mg Tabs (Levocetirizine Dihydrochloride) .... Take 1 Tablet By Mouth Once A Day 12)  Tussin Cough 100 Mg/53ml Syrp (Guaifenesin) .... Use As Needed 13)  Tussionex Pennkinetic Er 8-10 Mg/60ml Lqcr (Chlorpheniramine-Hydrocodone) .Marland Kitchen.. 1 Teaspoonful  By Mouth Two Times A Day As Needed 14)  Symbicort 80-4.5 Mcg/act  Aero (Budesonide-Formoterol Fumarate) .... 2 Puffs First Thing  in Am and 2 Puffs Again in Pm About 12 Hours Later 15)  Terazol 7 0.4 % Crea (Terconazole) .... Use As Directed 16)  Prednisone 40mg  Taper  Allergies (verified): 1)  ! * Flu Vaccination 2)  Phenergan 3)  * Xolair PMH-FH-SH reviewed-no changes except otherwise noted  Review of Systems       The patient complains of prolonged cough.  The patient denies anorexia, fever, weight loss, weight gain, decreased hearing, dyspnea on exertion, abdominal pain, depression, and enlarged lymph nodes.    Physical Exam  General:  overweight AA female in no distress Head:  Normocephalic and atraumatic without obvious abnormalities. No apparent alopecia or balding. Eyes:  corneas and lenses clear and  no injection.   Lungs:  normal respiratory effort, normal breath sounds, no crackles, and no wheezes.   Heart:  normal rate and regular rhythm.     Impression & Recommendations:  Problem # 1:  ASTHMA (ICD-493.90) Recovering from exacerbation.  Plan   complete steroid taper: 40mg  once daily x 3, 20mg  once daily x 3, 10mg  once daily x 6. she is instructed to take a step back if she develops wheezing or decreased peak flow at any stage.           Return to work Monday, June 13, 2009  Her updated medication list for this problem includes:    Xopenex Hfa 45 Mcg/act Aero (Levalbuterol tartrate) .Marland Kitchen... 2 puffs four times a day as needed    Singulair 10 Mg Tabs (Montelukast sodium) .Marland Kitchen... Take 1 tablet by mouth once a day    Xopenex  1.25 Mg/70ml Nebu (Levalbuterol hcl) ..... Use as directed    Symbicort 80-4.5 Mcg/act Aero (Budesonide-formoterol fumarate) .Marland Kitchen... 2 puffs first thing  in am and 2 puffs again in pm about 12 hours later  Complete Medication List: 1)  Xopenex Hfa 45 Mcg/act Aero (Levalbuterol tartrate) .... 2 puffs four times a day as needed 2)  Singulair 10 Mg Tabs (Montelukast sodium) .... Take 1 tablet by mouth once a day 3)  Veramyst 27.5 Mcg/spray Susp (Fluticasone furoate) .... Take 2 sprays in each nostril once a day 4)  Epipen 2-pak 0.3 Mg/0.83ml (1:1000) Devi (Epinephrine hcl (anaphylaxis)) .... For severe allergic reaction 5)  Xopenex 1.25 Mg/3ml Nebu (Levalbuterol hcl) .... Use as directed 6)  Mucinex Dm 30-600 Mg Tb12 (Dextromethorphan-guaifenesin) .... Take 1 to 2 tablets by mouth every 4 hours as needed. 7)  Patanol 0.1 % Soln (Olopatadine hcl) .Marland Kitchen.. 1 qtt ou two times a day as needed allergic conjunctivitis 8)  Protonix 40 Mg Tbec (Pantoprazole sodium) .... Take 1 tablet by mouth two times a day 9)  Furosemide 40 Mg Tabs (Furosemide) .... Take 1 tablet by mouth once a day as needed. 10)  Tramadol Hcl 50 Mg Tabs (Tramadol hcl) .... Take 1 tablet by mouth every 4 hours as needed. 11)  Xyzal 5 Mg Tabs (Levocetirizine dihydrochloride) .... Take 1 tablet by mouth once a day 12)  Tussin Cough 100 Mg/57ml Syrp (Guaifenesin) .... Use as needed 13)  Tussionex Pennkinetic Er 8-10 Mg/66ml Lqcr (Chlorpheniramine-hydrocodone) .Marland Kitchen.. 1 teaspoonful  by mouth two times a day as needed 14)  Symbicort 80-4.5 Mcg/act Aero (Budesonide-formoterol fumarate) .... 2 puffs first thing  in am and 2 puffs again in pm about 12 hours later 15)  Terazol 7 0.4 % Crea (Terconazole) .... Use as directed 16)  Prednisone 40mg  Taper

## 2010-05-23 NOTE — Assessment & Plan Note (Signed)
  GI problem list: 1. Dysphasia. EGD August, 2009 suggested a snug GE junction. Dilation up to 19 mm helped very much but not completely.    History of Present Illness Visit Type: follow up Primary GI MD: Rob Bunting MD Primary Provider: Illene Regulus, MD Requesting Provider: Illene Regulus, MD Chief Complaint: Follow-up visit from EGD History of Present Illness:     since her EGD 6 weeks ago she has noticed a definite improvement in her swallowing and dysphasia however its not complete. She  says that a skittle seemed to get lodged at one.  some steak also lodged.             Prior Medications Reviewed Using: Patient Recall  Prior Medication List:  SYMBICORT 160-4.5 MCG/ACT  AERO (BUDESONIDE-FORMOTEROL FUMARATE) Inhale 2 puffs two times a day ZYRTEC ALLERGY 10 MG  TABS (CETIRIZINE HCL) Take 1 tablet by mouth once a day at bedtime PROTONIX 40 MG  TBEC (PANTOPRAZOLE SODIUM) Take 1 tablet by mouth two times a day SINGULAIR 10 MG  TABS (MONTELUKAST SODIUM) Take 1 tablet by mouth once a day VERAMYST 27.5 MCG/SPRAY  SUSP (FLUTICASONE FUROATE) Take 2 sprays in each nostril once a day FUROSEMIDE 40 MG  TABS (FUROSEMIDE) Take 1 tablet by mouth once a day as needed. PATANOL 0.1 %  SOLN (OLOPATADINE HCL) 1 qtt ou two times a day as needed allergic conjunctivitis EPIPEN 2-PAK 0.3 MG/0.3ML (1:1000)  DEVI (EPINEPHRINE HCL (ANAPHYLAXIS)) for severe allergic reaction ALBUTEROL 90 MCG/ACT  AERS (ALBUTEROL) Inhale 2 puffs every four hours as needed MUCINEX DM 30-600 MG  TB12 (DEXTROMETHORPHAN-GUAIFENESIN) Take 1 to 2 tablets by mouth every 4 hours as needed. TRAMADOL HCL 50 MG  TABS (TRAMADOL HCL) Take 1 tablet by mouth every 4 hours as needed. XOPENEX 1.25 MG/3ML  NEBU (LEVALBUTEROL HCL) Use as directed   Current Allergies: ! * FLU VACCINATION PHENERGAN * XOLAIR      Vital Signs:  Patient Profile:   45 Years Old Female Height:     64 inches Weight:      236.25 pounds BMI:      40.70 Pulse rate:   82 / minute Pulse rhythm:   regular BP sitting:   122 / 76  (left arm)  Vitals Entered By: Merri Ray CMA (January 16, 2008 3:51 PM)                  Physical Exam  Constitutional: generally well appearing Psychiatric: alert and oriented times 3 Abdomen: soft, non-tender, non-distended, normal bowel sounds     Impression & Recommendations:  Problem # 1:  DYSPHAGIA (ICD-787.29)  I would not expect achalasia related dysphasia to improve at all with balloon dilation such as this, usually achalasia requires 3 cm dilation. My suspicion for achalasia is less. I think simply repeating EGD with dilation up to 19-20 mm is reasonable this point. We'll arrange for this be done at her soonest convenience.   Patient Instructions: 1)  You will be scheduled to have an upper endoscopy with dilation (at [facility] in next 2-3 weeks). 2)  A copy of this information will be sent to Dr. Debby Bud.    ]  Appended Document: Orders Update/EGD    Clinical Lists Changes  Orders: Added new Test order of ZEGD Balloon Dil (ZEGD Balloon) - Signed

## 2010-05-23 NOTE — Progress Notes (Signed)
Summary: nos appt  Phone Note Call from Patient   Caller: [email or web address]  Call For: [name] Summary of Call: Rsc nos from 11/10 to 12/16. Initial call taken by: Darletta Moll,  March 03, 2010 9:46 AM

## 2010-05-23 NOTE — Miscellaneous (Signed)
Summary: Waiver of [employer]   Imported By: Esmeralda Links D'jimraou 11/13/2007 13:05:36  _____________________________________________________________________  External Attachment:    Type:   Image     Comment:   External Document

## 2010-05-23 NOTE — Procedures (Signed)
Summary: Instructions for Procedure/[facility] (Outpt)  Instructions for Procedure/[facility] ([facility])   Imported By: Esmeralda Links D'jimraou 11/13/2007 13:03:52  _____________________________________________________________________  External Attachment:    Type:   Image     Comment:   External Document

## 2010-05-23 NOTE — Progress Notes (Signed)
Summary: On call- prednisone taper 40 mg/ 8 days, doxycycline #8/ 7 days.  Phone Note Call from Patient   Summary of Call: On call- Given augmentin at last ov, but c/o head congestion, nasal drainage, cough. Plan - [facility]one [facility]
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
## 2010-05-23 NOTE — Procedures (Signed)
Summary: Instructions for Procedure/[facility] ([facility])  Instructions for Procedure/[facility] ([facility])   Imported By: Esmeralda Links D'jimraou 01/19/2008 13:31:14  _____________________________________________________________________  External Attachment:    Type:   Image     Comment:   External Document

## 2010-05-23 NOTE — Progress Notes (Signed)
Summary: PREDNISONE  Phone Note From Pharmacy   Caller: Cell [phone] Summary of Call: Pharm called. Pt picked up rx for tussinex but not prednisone. Pharm also has concerns about mulitple tussinex rx's. Spoke with dr, he wants [place] to call pt and stress importance of taking prednisone. Pt not at work, [employer] # disconnected, cell # in EMR unable to leave vm.  Initial call taken by: Lamar Sprinkles, CMA,  June 06, 2009 5:45 PM  Follow-up for Phone Call        Not at work, cell # goes to vm but it is full. Home # disconnected........................Marland KitchenLamar Sprinkles, CMA  June 07, 2009 8:04 AM   Spoke with pharm, they have no new #'s for patient. Pt has gotten rx's for prednisone and tussonex from Dr Felicity Coyer, [facility] Corin, [facility](urgent care) recently. Last filled prednisone 1/6 written by Dr Felicity Coyer.  Follow-up by: Lamar Sprinkles, CMA,  June 07, 2009 8:26 AM  Additional Follow-up for Phone Call Additional follow up Details #1::        Called patient: she had a supply of prednisone at home Additional Follow-up by: Jacques Navy MD,  June 07, 2009 10:53 AM

## 2010-05-23 NOTE — Progress Notes (Signed)
Summary: sick call  Phone Note Call from Patient   Caller: Patient Call For: Gaelan Glennon,NP Complaint: Breathing Problems Summary of Call: Pt called  c/o productive cough(green) no fever. Omnicef 300 mg 2 tabs once daily x 7  days called to pharmacy.  Pt told will need ov lif no better per Leonna Schlee. Initial call taken by: Jerolyn Shin,  June 26, 2007 4:16 PM    New/Updated Medications: OMNICEF 300 MG  CAPS (CEFDINIR) Take two tabs once daily x 7 days   Prescriptions: OMNICEF 300 MG  CAPS (CEFDINIR) Take two tabs once daily x 7 days  #14 x 0   Entered by:   Jerolyn Shin   Authorized by:   Rubye Oaks NP   Signed by:   Jerolyn Shin on 06/26/2007   Method used:   Telephoned to ...       [address]       [address]       [place], [place]  [address]       Ph: [phone]       Fax: [phone]   RxID:   [id number]

## 2010-05-23 NOTE — Progress Notes (Signed)
Summary: samples  Phone Note Call from Patient Call back at [phone]   Caller: Patient Call For: young Reason for Call: Talk to Nurse Summary of Call: need samples of xyzal Initial call taken by: Eugene Gavia,  August 05, 2008 2:30 PM  Follow-up for Phone Call        Attempted to contact pt.  LMOM TCB.  Xyzal is not on pt's med list.  Is this OK for pt to have?  OK to provide samples?  Please advise. Thanks Follow-up by: Cloyde Reams RN,  August 05, 2008 2:38 PM  Additional Follow-up for Phone Call Additional follow up Details #1::        pt called again...states she is "going to have to go to the hospital soon if she doesn't get something."  pt wanted to hold while i took the message back to [place], but i told her Dr. Maple Hudson may in a room so told her i will call her back.  please advise, thanks! Additional Follow-up by: Boone Master CNA,  August 06, 2008 3:00 PM    Additional Follow-up for Phone Call Additional follow up Details #2::    per CY: okay for xyzal #30, 1 by mouth once daily with as needed refills.  may also have savings card to avoid kick-back from insurance.  rx sent to pharmacy.  pt is aware of this and the savings card is up front for her to pick up.  pt asked for samples...we could only spare 3 and i told her this.  samples and savings card left up front for pt. Follow-up by: Boone Master CNA,  August 06, 2008 3:21 PM  New/Updated Medications: XYZAL 5 MG TABS (LEVOCETIRIZINE DIHYDROCHLORIDE) Take 1 tablet by mouth once a day   Prescriptions: XYZAL 5 MG TABS (LEVOCETIRIZINE DIHYDROCHLORIDE) Take 1 tablet by mouth once a day  #30 x PRN   Entered by:   Boone Master CNA   Authorized by:   Waymon Budge MD   Signed by:   Boone Master CNA on 08/06/2008   Method used:   Electronically to        [facility]* (retail)       [address]       [address] Shipping/mailing       [place], [place]  [address]       Ph: [phone]       Fax:  [phone]   RxID:   [id number]

## 2010-05-23 NOTE — Miscellaneous (Signed)
Summary: Waiver of [employer]   Imported By: Esmeralda Links D'jimraou 01/19/2008 13:40:59  _____________________________________________________________________  External Attachment:    Type:   Image     Comment:   External Document

## 2010-05-23 NOTE — Progress Notes (Signed)
Summary: H1N1  Phone Note Call from Patient Call back at [phone]   Caller: Patient Call For: young Summary of Call: pt have questions about h1n1 Initial call taken by: Rickard Patience,  February 03, 2008 2:11 PM  Follow-up for Phone Call        [employer] has told employee's the h1n1 vaccine  is mandatory for all employee's, she is afraid to take it because everytime she takes the regular flu shot she gets sick would like to speak directly with cy before she will let them give her the vaccine Follow-up by: Philipp Deputy CMA,  February 03, 2008 3:16 PM  Additional Follow-up for Phone Call Additional follow up Details #1::        She has had previous hx of severe ill, incl ventilator, associated with prior flu vaccine. She has physician excuse lette already. I explained that note should also cover for the H!N! vacciine, which is made the same way. Additional Follow-up by: Waymon Budge MD,  February 04, 2008 8:41 PM

## 2010-05-23 NOTE — Progress Notes (Signed)
Summary: rx  Phone Note Call from Patient   Caller: Patient Call For: young Reason for Call: Acute Illness, Talk to Nurse Summary of Call: wheezing coughing, spoke to CDY in the hosp.  Need some prednisone [facility] Initial call taken by: Eugene Gavia,  April 19, 2008 2:31 PM  Follow-up for Phone Call        Dr. Maple Hudson, did you want to call pt in a pred taper? Please advise, thanks Vernie Murders  April 19, 2008 2:37 PM   Additional Follow-up for Phone Call Additional follow up Details #1::        pred 8 d taper okay per CDY Vernie Murders  April 19, 2008 5:29 PM  Rx called to pharm.  ATC pt NA unable to leave a msg. Vernie Murders  April 19, 2008 5:32 PM   spoke is aware her script for pred taper is at the pharmacy.  Additional Follow-up by: Boone Master CNA,  April 20, 2008 3:33 PM    New/Updated Medications: PREDNISONE 10 MG TABS (PREDNISONE) 4 a day x 2 days, 3 a day x 2 days, 2 a day x 2 days and then once daily x 2 days then stop   Prescriptions: PREDNISONE 10 MG TABS (PREDNISONE) 4 a day x 2 days, 3 a day x 2 days, 2 a day x 2 days and then once daily x 2 days then stop  #20 x 0   Entered by:   Vernie Murders   Authorized by:   Waymon Budge MD   Signed by:   Vernie Murders on 04/19/2008   Method used:   Electronically to        [facility]* (retail)       [address]       [address] Shipping/mailing       [place], [place]  [address]       Ph: [phone]       Fax: [phone]   RxID:   [id number]

## 2010-05-23 NOTE — Progress Notes (Signed)
Summary: rx for z pak and sudafed  Phone Note Call from Patient Call back at [phone]   Caller: Patient Call For: young Reason for Call: Talk to Nurse Summary of Call: thinks she has sinus infection, need a decongestant, clear drainage, cough, tightness in head, headache. [facility] Initial call taken by: Eugene Gavia,  November 29, 2009 9:17 AM  Follow-up for Phone Call        Pt c/o sinus pressure starting this AM without relief from Mucinex DM. Bodyaches this weekend with T-101.2 no fever since Sunday, using Prednisone 30mg daily this weekend "to stay on top of things". Asthma is under control but peak flow has decreased from 400 to 325 and is now up to 375. Pt states "tell him I don\'t feel like I normally do, I just need a decongestant". Please advise. Jessica Robinson CMA  November 29, 2009 9:59 AM   Allergies (verified):  1)  ! * Flu Vaccination 2)  Phenergan 3)  * Xolair  Additional Follow-up for Phone Call Additional follow up Details #1::        Per CDY-Decongestant-try Sudafed  and offer Zpak #1 take as directed no refills.Katie Welchel CMA  November 29, 2009 10:31 AM     Additional Follow-up for Phone Call Additional follow up Details #2::    pt aware of CY\'s recs.  pt verbalized understanding and is aware rx sent to pharmacy.  Megan Reynolds LPN  November 29, 2009 10:45 AM   New/Updated Medications: ZITHROMAX Z-PAK 250 MG TABS (AZITHROMYCIN) take as directed SUDAFED 30 MG TABS (PSEUDOEPHEDRINE HCL) take as directed on box Prescriptions: SUDAFED 30 MG TABS (PSEUDOEPHEDRINE HCL) take as directed on box  #30 x 0   Entered by:   Megan Reynolds LPN   Authorized by:   Clinton D Young MD   Signed by:   Megan Reynolds LPN on 11/29/2009   Method used:   Electronically to        [facility]* (retail)       [address]       [address] Shipping/mailing       [place], [place]  [address]       Ph: [phone]       Fax: [phone]   RxID:    [id number] ZITHROMAX Z-PAK 250 MG TABS (AZITHROMYCIN) take as directed  #1 x 0   Entered by:   Megan Reynolds LPN   Authorized by:   Clinton D Young MD   Signed by:   Megan Reynolds LPN on 11/29/2009   Method used:   Electronically to        [facility]* (retail)       [address]       [address] Shipping/mailing       [place], [place]  [address]       Ph: [phone]       Fax: [phone]   RxID:   [id number]

## 2010-05-23 NOTE — Initial Assessments (Signed)
Summary: admit / SD   Vital Signs:  Patient profile:   45 year old female Height:      63 inches Weight:      203 pounds BMI:     36.09 O2 Sat:      98 % on Room air Temp:     98.8 degrees F oral Pulse rate:   113 / minute BP sitting:   126 / 78  (left arm) Cuff size:   large  Vitals Entered By: Bill Salinas CMA (June 02, 2009 4:12 PM)  O2 Flow:  Room air CC: pt here for admit/ ab   Primary Care Provider:  Illene Regulus, MD  CC:  pt here for admit/ ab.  History of Present Illness: Patient with h/o asthma who has been have an exacerbation for several days, since Sunday. she has been on doxycycline and she has been doing MDIs or Nebs every four hours. she is now very short of breath with peak flow at about 200 with baseline being about 400. She is now admitted for IV steroids, nebs, O2.  Current Medications (verified): 1)  Xopenex Hfa 45 Mcg/act Aero (Levalbuterol Tartrate) .... 2 Puffs Four Times A Day As Needed 2)  Singulair 10 Mg  Tabs (Montelukast Sodium) .... Take 1 Tablet By Mouth Once A Day 3)  Veramyst 27.5 Mcg/spray  Susp (Fluticasone Furoate) .... Take 2 Sprays in Each Nostril Once A Day 4)  Epipen 2-Pak 0.3 Mg/0.45ml (1:1000)  Devi (Epinephrine Hcl (Anaphylaxis)) .... For Severe Allergic Reaction 5)  Xopenex 1.25 Mg/57ml  Nebu (Levalbuterol Hcl) .... Use As Directed 6)  Mucinex Dm 30-600 Mg  Tb12 (Dextromethorphan-Guaifenesin) .... Take 1 To 2 Tablets By Mouth Every 4 Hours As Needed. 7)  Patanol 0.1 %  Soln (Olopatadine Hcl) .Marland Kitchen.. 1 Qtt Ou Two Times A Day As Needed Allergic Conjunctivitis 8)  Protonix 40 Mg  Tbec (Pantoprazole Sodium) .... Take 1 Tablet By Mouth Two Times A Day 9)  Furosemide 40 Mg  Tabs (Furosemide) .... Take 1 Tablet By Mouth Once A Day As Needed. 10)  Tramadol Hcl 50 Mg  Tabs (Tramadol Hcl) .... Take 1 Tablet By Mouth Every 4 Hours As Needed. 11)  Xyzal 5 Mg Tabs (Levocetirizine Dihydrochloride) .... Take 1 Tablet By Mouth Once A Day 12)  Tussin  Cough 100 Mg/79ml Syrp (Guaifenesin) .... Use As Needed 13)  Tussionex Pennkinetic Er 8-10 Mg/49ml Lqcr (Chlorpheniramine-Hydrocodone) .Marland Kitchen.. 1 Teaspoonful  By Mouth Two Times A Day As Needed 14)  Symbicort 80-4.5 Mcg/act  Aero (Budesonide-Formoterol Fumarate) .... 2 Puffs First Thing  in Am and 2 Puffs Again in Pm About 12 Hours Later 15)  Terazol 7 0.4 % Crea (Terconazole) .... Use As Directed 16)  Prednisone 40mg  Taper  Allergies (verified): 1)  ! * Flu Vaccination 2)  Phenergan 3)  * Xolair  Past History:  Past Medical History: Last updated: 05/02/2009 UCD preeclampsia pseudotumor cerebri - has required LP for release of pressure Allergic rhinitis Asthma-h/o intubation '01 GERD Right Knee surgery 2008-09-16 miniscal tear Dysphagia......................................................................Christella Hartigan     - EGD with dilatation 06/08/2007   Physician Roster:                 Pul/allergy - Artist Beach - Stefano Gaul  Past Surgical History: Last updated: May 14, 2007 Tonsillectomy uterine ablation for metorrhagia/fibroids  Family History: Last updated: 05-14-07 father -died 38, CVA, HTN mother - 17-Sep-2050;  CVA, HTN Neg- breast, colon cancer; CAD DM - grandmother and aunt  Social History: Last updated: 04/13/2009 [employer] married '94 - remarried 09/2008 1 son '88 in college [employer] Work: Charity fundraiser at [employer] SO - good health marriage good health no hx/o abuse  Risk Factors: Alcohol Use: <1 (04/30/2007) Caffeine Use: 0 (04/30/2007) Exercise: yes (04/30/2007)  Risk Factors: Smoking Status: never (04/30/2007)  Review of Systems  The patient denies anorexia, fever, weight loss, weight gain, decreased hearing, chest pain, syncope, peripheral edema, headaches, abdominal pain, hematochezia, hematuria, muscle weakness, difficulty walking, enlarged lymph nodes, and angioedema.    Physical Exam  General:  overweight AA female in respiratory distress Head:   Normocephalic and atraumatic without obvious abnormalities. No apparent alopecia or balding. Eyes:  No corneal or conjunctival inflammation noted. EOMI. Perrla. Funduscopic exam benign, without hemorrhages, exudates or papilledema. Vision grossly normal. Ears:  R ear normal and L ear normal.   Nose:  no external deformity and no external erythema.   Mouth:  good dentition.  Posterior phyarynx clear Neck:  supple, full ROM, and no thyromegaly.   Chest Wall:  no deformities.   Breasts:  deferred Lungs:  incraeased work of breathing with mild neck retractions, no abdominal breathing. Very tight with decreased air flow and mild wheezing, no rales.  Heart:  normal rate, regular rhythm, and no murmur.   Abdomen:  soft, non-tender, and no distention.   Genitalia:  deferred Msk:  No deformity or scoliosis noted of thoracic or lumbar spine.   Pulses:  2+ radial pulses bilaterally Extremities:  No clubbing, cyanosis, edema, or deformity noted with normal full range of motion of all joints.   Neurologic:  No cranial nerve deficits noted. Station and gait are normal. Plantar reflexes are down-going bilaterally. DTRs are symmetrical throughout. Sensory, motor and coordinative functions appear intact. Skin:  Intact without suspicious lesions or rashes Cervical Nodes:  no anterior cervical adenopathy and no posterior cervical adenopathy.   Psych:  Oriented X3, memory intact for recent and remote, normally interactive, and good eye contact.     Impression & Recommendations:  Problem # 1:  BRONCHITIS-ACUTE (ICD-466.0)  Patient with acute asthmatic bronchitis in a setting of asthma. Very short of breath with increased work of breathing  Plan - regular admit           solumedrol 80 mg IV q 8           xopenex 1.25 mg Nebs q 4 around the clock           O2 at 2 l [place]  Her updated medication list for this problem includes:    Xopenex Hfa 45 Mcg/act Aero (Levalbuterol tartrate) .Marland Kitchen... 2 puffs four times a  day as needed    Singulair 10 Mg Tabs (Montelukast sodium) .Marland Kitchen... Take 1 tablet by mouth once a day    Xopenex 1.25 Mg/23ml Nebu (Levalbuterol hcl) ..... Use as directed    Mucinex Dm 30-600 Mg Tb12 (Dextromethorphan-guaifenesin) .Marland Kitchen... Take 1 to 2 tablets by mouth every 4 hours as needed.    Tussin Cough 100 Mg/71ml Syrp (Guaifenesin) ..... Use as needed    Tussionex Pennkinetic Er 8-10 Mg/82ml Lqcr (Chlorpheniramine-hydrocodone) .Marland Kitchen... 1 teaspoonful  by mouth two times a day as needed    Symbicort 80-4.5 Mcg/act Aero (Budesonide-formoterol fumarate) .Marland Kitchen... 2 puffs first thing  in am and 2 puffs again in pm about 12 hours later  Orders: No Charge Patient Arrived (NCPA0) (NCPA0)  Problem # 2:  GERD (ICD-530.81) Continue PPI.  Her updated medication list for this problem includes:    Protonix 40 Mg Tbec (Pantoprazole sodium) .Marland Kitchen... Take 1 tablet by mouth two times a day  Complete Medication List: 1)  Xopenex Hfa 45 Mcg/act Aero (Levalbuterol tartrate) .... 2 puffs four times a day as needed 2)  Singulair 10 Mg Tabs (Montelukast sodium) .... Take 1 tablet by mouth once a day 3)  Veramyst 27.5 Mcg/spray Susp (Fluticasone furoate) .... Take 2 sprays in each nostril once a day 4)  Epipen 2-pak 0.3 Mg/0.46ml (1:1000) Devi (Epinephrine hcl (anaphylaxis)) .... For severe allergic reaction 5)  Xopenex 1.25 Mg/58ml Nebu (Levalbuterol hcl) .... Use as directed 6)  Mucinex Dm 30-600 Mg Tb12 (Dextromethorphan-guaifenesin) .... Take 1 to 2 tablets by mouth every 4 hours as needed. 7)  Patanol 0.1 % Soln (Olopatadine hcl) .Marland Kitchen.. 1 qtt ou two times a day as needed allergic conjunctivitis 8)  Protonix 40 Mg Tbec (Pantoprazole sodium) .... Take 1 tablet by mouth two times a day 9)  Furosemide 40 Mg Tabs (Furosemide) .... Take 1 tablet by mouth once a day as needed. 10)  Tramadol Hcl 50 Mg Tabs (Tramadol hcl) .... Take 1 tablet by mouth every 4 hours as needed. 11)  Xyzal 5 Mg Tabs (Levocetirizine dihydrochloride) ....  Take 1 tablet by mouth once a day 12)  Tussin Cough 100 Mg/63ml Syrp (Guaifenesin) .... Use as needed 13)  Tussionex Pennkinetic Er 8-10 Mg/34ml Lqcr (Chlorpheniramine-hydrocodone) .Marland Kitchen.. 1 teaspoonful  by mouth two times a day as needed 14)  Symbicort 80-4.5 Mcg/act Aero (Budesonide-formoterol fumarate) .... 2 puffs first thing  in am and 2 puffs again in pm about 12 hours later 15)  Terazol 7 0.4 % Crea (Terconazole) .... Use as directed 16)  Prednisone 40mg  Taper

## 2010-05-23 NOTE — Miscellaneous (Signed)
Summary: Orders Update  Clinical Lists Changes  Orders: Added new Service order of Est. Patient Level II (99212) - Signed 

## 2010-05-23 NOTE — Letter (Signed)
Summary: [facility] Consult Scheduled Letter  [facility]  [address] [place], [place] [address]   Phone: [phone]  Fax: [phone]      10/20/2007 MRN: [id number]  [facility] [address] [place], [place]  [address]    Dear Ms. Sandner,      We have scheduled an appointment for you.  At the recommendation of Dr. Debby Bud, we have scheduled you a consult with Dr. Christella Hartigan on November 12, 2007 at 9:15am.  Their phone number is [phone].  If this appointment day and time is not convenient for you, please feel free to call the office of the doctor you are being referred to at the number listed above and reschedule the appointment.  Dr. Drue [facility] [address] [place] [address] [place], [place] [address]  *Please give 24hr notice to cancel/reschedule your appointment to avoid a $50.00 fee*  Thank you,  Patient Care Coordinator  [facility]

## 2010-05-23 NOTE — Assessment & Plan Note (Signed)
Summary: Pulmonary/ acute ov with hfa teaching   Primary Provider/Referring Provider:  Illene Regulus, MD  CC:  Followup ER visit.  Pt went to [facility] with cough and SOB on 04/30/09.  She states that her symptoms started about to wks ago.  Cough is prod with green sputum.  Pt also c/o chest tightness and wheezing.[name]  History of Present Illness: 45 year old woman returning for follow-up of asthma, and allergic rhinitis with a history of aspiration.  She works as a Engineer, civil (consulting).    04/29/08- Asthma, allergic rhinitis        Fiance with her Working with Dr Christella Hartigan on esophagus- hasn't gotten choked lately. Denies feeling reflux or nasal congestion/ postnasal drip. Tussive soreness mid sternal. Feels as if about to come down with something, but denies purulent/ fever. Asking for Tussionex.  September 28, 2008 - Presents for an Acute visit pt c/o prod cough green, mild sob states her temp was 103 this am. Is getting married in 2.5 weeks. Nasal congesiton, mild wheeizng.   May 02, 2009 fine until 3 weeks ago.  4 weeks no cough no sob on symbicort 160 once or twice a day, ppi in am and at supper, nightly xyzal but no nasal steroids,   then 3 weeks prior to ovuri and rx zpak and now green mucus.  Pt denies any significant sore throat, dysphagia, itching, sneezing,  nasal congestion or excess secretions,  fever, chills, sweats, unintended wt loss, pleuritic or exertional cp, hempoptysis, change in activity tolerance  orthopnea pnd or leg swelling. Pt also denies any obvious fluctuation in symptoms with weather or environmental change or other alleviating or aggravating factors.         Current Medications (verified): 1)  Symbicort 160-4.5 Mcg/act  Aero (Budesonide-Formoterol Fumarate) .... Inhale 2 Puffs Two Times A Day 2)  Xopenex Hfa 45 Mcg/act Aero (Levalbuterol Tartrate) .... 2 Puffs Four Times A Day As Needed 3)  Singulair 10 Mg  Tabs (Montelukast Sodium) .... Take 1 Tablet By Mouth Once A Day 4)  Veramyst 27.5  Mcg/spray  Susp (Fluticasone Furoate) .... Take 2 Sprays in Each Nostril Once A Day 5)  Epipen 2-Pak 0.3 Mg/0.55ml (1:1000)  Devi (Epinephrine Hcl (Anaphylaxis)) .... For Severe Allergic Reaction 6)  Xopenex 1.25 Mg/52ml  Nebu (Levalbuterol Hcl) .... Use As Directed 7)  Mucinex Dm 30-600 Mg  Tb12 (Dextromethorphan-Guaifenesin) .... Take 1 To 2 Tablets By Mouth Every 4 Hours As Needed. 8)  Patanol 0.1 %  Soln (Olopatadine Hcl) .[name].. 1 Qtt Ou Two Times A Day As Needed Allergic Conjunctivitis 9)  Protonix 40 Mg  Tbec (Pantoprazole Sodium) .... Take 1 Tablet By Mouth Two Times A Day 10)  Furosemide 40 Mg  Tabs (Furosemide) .... Take 1 Tablet By Mouth Once A Day As Needed. 11)  Tramadol Hcl 50 Mg  Tabs (Tramadol Hcl) .... Take 1 Tablet By Mouth Every 4 Hours As Needed. 12)  Xyzal 5 Mg Tabs (Levocetirizine Dihydrochloride) .... Take 1 Tablet By Mouth Once A Day 13)  Tussin Cough 100 Mg/51ml Syrp (Guaifenesin) .... Use As Needed 14)  Tussionex Pennkinetic Er 8-10 Mg/46ml Lqcr (Chlorpheniramine-Hydrocodone) .[name].. 1 Teaspoonful  By Mouth Two Times A Day As Needed  Allergies (verified): 1)  ! * Flu Vaccination 2)  Phenergan 3)  * Xolair  Past History:  Past Medical History: UCD preeclampsia pseudotumor cerebri - has required LP for release of pressure Allergic rhinitis Asthma-h/o intubation '01 GERD Right Knee surgery 09/02/2008 miniscal tear Dysphagia......................................................................Christella Hartigan     -  EGD with dilatation 06/08/2007   Physician Roster:                 Pul/allergy - Artist Beach - Stringer  Vital Signs:  Patient profile:   45 year old female Weight:      203 pounds O2 Sat:      98 % on Room air Temp:     98.5 degrees F oral Pulse rate:   91 / minute BP sitting:   120 / 82  (left arm)  Vitals Entered By: Vernie Murders (May 02, 2009 10:14 AM)  O2 Flow:  Room air  Physical Exam  Additional Exam:  wt 220 > 203 May 02, 2009 amb bf with classic pseudowheeze resolves with purse lip maneuver  HEENT: nl dentition  and orophanx - mod nonspecific turbinated edema. Nl external ear canals without cough reflex NECK :  without JVD/Nodes/TM/ nl carotid upstrokes bilaterally LUNGS: no acc muscle use, clear to A and P bilaterally without cough on insp or exp maneuvers CV:  RRR  no s3 or murmur or increase in P2, no edema  ABD:  soft and nontender with nl excursion in the supine position. No bruits or organomegaly, bowel sounds nl MS:  warm without deformities, calf tenderness, cyanosis or clubbing SKIN: warm and dry without lesions   NEURO:  alert, approp, no deficits     Impression & Recommendations:  Problem # 1:  ASTHMA (ICD-493.90)  DDX of  difficult airways managment all start with A and  include Adherence, Ace Inhibitors, Acid Reflux, Active Sinus Disease, Alpha 1 Antitripsin deficiency, Anxiety masquerading as Airways dz,  ABPA,  allergy(esp in young), Aspiration (esp in elderly), Adverse effects of DPI,  Active smokers, plus one B  = Beta blocker use..   Adherence a major issue, not clear she's on consistent steroids.  Much prefer she use symbicort 80 2 puffs first thing  in am and 2 puffs again in pm about 12 hours later and not inconsistently use the 160.  Same with nasal steroids, should always use at least one puff at bedtime  I spent extra time with the patient today explaining optimal mdi  technique.  This improved from  50-90% with coaching   Each maintenance medication was reviewed in detail including most importantly the difference between maintenance and as needed and under what circumstances the prns are to be used. See instructions for specific recommendations   Problem # 2:  DYSPHAGIA (ICD-787.29)  Max ppi plus add h2 when flare and reinforced diet.  Orders: Est. Patient Level IV ([address])  Medications Added to Medication List This Visit: 1)  Symbicort 80-4.5 Mcg/act Aero  (Budesonide-formoterol fumarate) .... 2 puffs first thing  in am and 2 puffs again in pm about 12 hours later 2)  Augmentin 875-125 Mg Tabs (Amoxicillin-pot clavulanate) .... By mouth twice daily  Patient Instructions: 1)  Augmentin 875 one twice daily with brfast and supper and yogurt for lunch 2)  Finish your prednisone 3)  Change Symbicort 80  2 puffs first thing  in am and 2 puffs again in pm about 12 hours later  4)  Protonix Take one 30-60 min before first and last meals of the day and add pepcid 20 mg one at bedtime with any flare 5)  Add veramyst minimal one at bedtime 6)  Continue cough medication as needed 7)  GERD (REFLUX)  is  a common cause of respiratory symptoms. It commonly presents without heartburn and can be treated with medication, but also with lifestyle changes including avoidance of late meals, excessive alcohol, smoking cessation, and avoid fatty foods, chocolate, peppermint, colas, red wine, and acidic juices such as orange juice. NO MINT OR MENTHOL PRODUCTS SO NO COUGH DROPS  8)  USE SUGARLESS CANDY INSTEAD (jolley ranchers)  9)  NO OIL BASED VITAMINS  10)  Work on inhaler technique:  relax and blow all the way out then take a nice smooth deep breath back in, triggering the inhaler at same time you start breathing in  11)  Use flutter valve  Prescriptions: AUGMENTIN 875-125 MG  TABS (AMOXICILLIN-POT CLAVULANATE) By mouth twice daily  #20 x 0   Entered and Authorized by:   Nyoka Cowden MD   Signed by:   Nyoka Cowden MD on 05/02/2009   Method used:   Electronically to        [facility]* (retail)       [address]       [address] Shipping/mailing       [place], [place]  [address]       Ph: [phone]       Fax: [phone]   RxID:   [id number] SYMBICORT 80-4.5 MCG/ACT  AERO (BUDESONIDE-FORMOTEROL FUMARATE) 2 puffs first thing  in am and 2 puffs again in pm about 12 hours later  #1 x 11   Entered and Authorized by:   Nyoka Cowden  MD   Signed by:   Nyoka Cowden MD on 05/02/2009   Method used:   Electronically to        [facility]* (retail)       [address]       [address] Shipping[address]mailing       [place], [place]  [address]       Ph: [phone]       Fax: [phone]   RxID:   [id number]

## 2010-05-23 NOTE — Miscellaneous (Signed)
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

## 2010-05-23 NOTE — Letter (Signed)
Summary: [facility] Request for Leave of Absence Form  [facility] Request for Leave of Absence Form   Imported By: Esmeralda Links D'jimraou 05/19/2007 12:23:28  _____________________________________________________________________  External Attachment:    Type:   Image     Comment:   External Document

## 2010-05-23 NOTE — Consult Note (Signed)
Summary: [facility]  [facility]   Imported By: Esmeralda Links D'jimraou 04/11/2007 11:12:17  _____________________________________________________________________  External Attachment:    Type:   Image     Comment:   External Document

## 2010-05-23 NOTE — Progress Notes (Signed)
  Phone Note Other Incoming   Request: Send information Summary of Call: Request for records received from [facility]. Request forwarded to [facility].     

## 2010-05-23 NOTE — Miscellaneous (Signed)
Summary: phone call for worsening pulmonary symptoms.  Clinical Lists Changes  pt called with worsening sob, chest tightness, cough....sounded terrible over the phone with obvious increased wob. Review of the record shows she has been sick for the last 2 weeks, and apparently has not responded to depo/pred pulse/ round of abx.  I told pt she needed to go to ER for evaluation, and that I did not feel comfortable calling anything in for her.  She agreed to do so.
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
## 2010-05-25 NOTE — Progress Notes (Signed)
Summary: appointment  Phone Note Call from Patient Call back at Home Phone [phone]   Caller: Patient Call For: DR. Maple Hudson Summary of Call: Patient had an appointment today for 3:15pm while she was waiting she got called back to work and had to leave at 3:52pm. She would like to reshcedule but the first available was 05/10/10. Patient would like to be sooner. Please call the patient at [phone] Initial call taken by: Vedia Coffer,  April 07, 2010 3:53 PM  Follow-up for Phone Call        Katie, pls advise if pt can be worked in somewhere.  Thanks! Gweneth Dimitri RN  April 07, 2010 4:20 PM   04-21-10 at 130.Reynaldo Minium CMA  April 07, 2010 5:08 PM   Additional Follow-up for Phone Call Additional follow up Details #1::        Pt aware of appt date/time Appt sched. Additional Follow-up by: Vernie Murders,  April 07, 2010 5:13 PM

## 2010-05-26 ENCOUNTER — Ambulatory Visit: Admit: 2010-05-26 | Payer: Self-pay | Admitting: Internal Medicine

## 2010-05-26 ENCOUNTER — Encounter (INDEPENDENT_AMBULATORY_CARE_PROVIDER_SITE_OTHER): Payer: Commercial Managed Care - PPO

## 2010-05-26 ENCOUNTER — Encounter: Payer: Self-pay | Admitting: Internal Medicine

## 2010-05-26 ENCOUNTER — Ambulatory Visit (INDEPENDENT_AMBULATORY_CARE_PROVIDER_SITE_OTHER): Payer: Commercial Managed Care - PPO | Admitting: Internal Medicine

## 2010-05-26 DIAGNOSIS — J45909 Unspecified asthma, uncomplicated: Secondary | ICD-10-CM

## 2010-05-26 DIAGNOSIS — J309 Allergic rhinitis, unspecified: Secondary | ICD-10-CM
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
## 2010-05-31 NOTE — Assessment & Plan Note (Signed)
Summary: FULL PFT/ROV AFTER AT 145PM//KCW   Primary Provider/Referring Provider:  Illene Regulus, MD   History of Present Illness: PFT interpretation filed.   Allergies: 1)  ! * Flu Vaccination 2)  Phenergan 3)  * Xolair   Other Orders: Carbon Monoxide diffusing w/capacity (84132) Lung Volumes/Gas dilution or washout (44010) Spirometry (Pre & Post) (27253)   Orders Added: 1)  Carbon Monoxide diffusing w/capacity [94729] 2)  Lung Volumes/Gas dilution or washout [94727] 3)  Spirometry (Pre & Post) [94060]

## 2010-07-09 LAB — BASIC METABOLIC PANEL
BUN: 10 mg/dL (ref 6–23)
CO2: 28 mEq/L (ref 19–32)
Calcium: 8.7 mg/dL (ref 8.4–10.5)
Chloride: 100 mEq/L (ref 96–112)
Creatinine, Ser: 0.91 mg/dL (ref 0.4–1.2)
GFR calc Af Amer: >60 mL/min (ref >60)
GFR calc non Af Amer: >60 mL/min (ref >60)
Glucose, Bld: 116 mg/dL — ABNORMAL HIGH (ref 70–99)
Potassium: 3.5 mEq/L (ref 3.5–5.1)
Sodium: 136 mEq/L (ref 135–145)

## 2010-07-09 LAB — BRAIN NATRIURETIC PEPTIDE: Pro B Natriuretic peptide (BNP): <30 pg/mL (ref 0.0–100.0)

## 2010-07-14 LAB — DIFFERENTIAL
Basophils Absolute: 0 10*3/uL (ref 0.0–0.1)
Basophils Relative: 0 % (ref 0–1)
Eosinophils Absolute: 0 10*3/uL (ref 0.0–0.7)
Eosinophils Relative: 0 % (ref 0–5)
Lymphocytes Relative: 12 % (ref 12–46)
Lymphs Abs: 1.1 10*3/uL (ref 0.7–4.0)
Monocytes Absolute: 0.1 10*3/uL (ref 0.1–1.0)
Monocytes Relative: 1 % — ABNORMAL LOW (ref 3–12)
Neutro Abs: 7.9 10*3/uL — ABNORMAL HIGH (ref 1.7–7.7)
Neutrophils Relative %: 87 % — ABNORMAL HIGH (ref 43–77)

## 2010-07-14 LAB — COMPREHENSIVE METABOLIC PANEL
ALT: 20 U/L (ref 0–35)
AST: 22 U/L (ref 0–37)
Albumin: 4 g/dL (ref 3.5–5.2)
Alkaline Phosphatase: 65 U/L (ref 39–117)
BUN: 9 mg/dL (ref 6–23)
CO2: 23 mEq/L (ref 19–32)
Calcium: 9.5 mg/dL (ref 8.4–10.5)
Chloride: 103 mEq/L (ref 96–112)
Creatinine, Ser: 1.04 mg/dL (ref 0.4–1.2)
GFR calc Af Amer: >60 mL/min (ref >60)
GFR calc non Af Amer: 58 mL/min — ABNORMAL LOW (ref >60)
Glucose, Bld: 193 mg/dL — ABNORMAL HIGH (ref 70–99)
Potassium: 3.9 mEq/L (ref 3.5–5.1)
Sodium: 134 mEq/L — ABNORMAL LOW (ref 135–145)
Total Bilirubin: 0.4 mg/dL (ref 0.3–1.2)
Total Protein: 7.3 g/dL (ref 6.0–8.3)

## 2010-07-14 LAB — CBC
HCT: 36.5 % (ref 36.0–46.0)
Hemoglobin: 12.2 g/dL (ref 12.0–15.0)
MCHC: 33.5 g/dL (ref 30.0–36.0)
MCV: 82.9 fL (ref 78.0–100.0)
Platelets: 312 10*3/uL (ref 150–400)
RBC: 4.4 MIL/uL (ref 3.87–5.11)
RDW: 14.3 % (ref 11.5–15.5)
WBC: 9.1 10*3/uL (ref 4.0–10.5)

## 2010-07-31 LAB — POCT HEMOGLOBIN-HEMACUE: Hemoglobin: 11.1 g/dL — ABNORMAL LOW (ref 12.0–15.0)

## 2010-08-25 ENCOUNTER — Encounter: Payer: Self-pay | Admitting: Internal Medicine

## 2010-08-29 ENCOUNTER — Ambulatory Visit: Payer: Commercial Managed Care - PPO | Admitting: Internal Medicine

## 2010-09-05 NOTE — Assessment & Plan Note (Signed)
[facility]                             PULMONARY OFFICE NOTE   Gina, Kerr                         MRN:          [id number]  DATE:03/27/2007                            DOB:          01-Feb-1966    PRIMARY PHYSICIAN:  Dr. Illene Regulus.   PROBLEMS:  1. Asthma.  2. History of esophageal reflux.  3. Allergic rhinitis.   HISTORY:  While I was out, she saw the nurse practitioner twice in  October and then saw Dr. Craige Cotta.  Today, she feels well.  Triggers include  the smell of tobacco on the cloths of coworkers who smoke.  We discussed  her medications.  She declines flu vaccine, insisting that she gets  sick each time she takes it.  She had previously failed Xolair because  of a sensation that made her throat tight.  Skin testing was positive  for common aeroallergens in September and allergy vaccine has remained  an option if needed.   MEDICATIONS:  1. Symbicort 160/4.5.  2. Zyrtec 10 mg.  3. Protonix 40 mg.  4. Singulair 10 mg.  5. Veramyst 2 sprays in each nostril daily.  6. Furosemide 40 mg p.r.n.  7. Patanase nasal spray twice in each nostril once a day.  8. She holds an EpiPen p.r.n.  9. Xopenex HFA inhaler.  10.Occasional use of Mucinex DM.  11.Tramadol 40 mg q.4 h. p.r.n. cough.  12.Tussionex used occasionally.  13.Xopenex by nebulizer 1.25 mg p.r.n.  14.She has had some Phenergan.   OBJECTIVE:  BP 130/88, pulse regular at 88, room air saturation 99%.  She refused to be weighed.  Minor cough with deep breath, but good air flow to the lung bases,  unlabored, no real wheeze.  HEART:  Sounds regular without murmur or gallop.  Minimal nasal turbinate edema without visible secretions now.  Pharynx  clear.  Voice quality normal.   IMPRESSION:  Somewhat better control this year of labile asthma with a  background of allergic rhinitis.  Irritants, allergens and reflux all  probably represent significant triggering factors.  We have  discussed  environmental precautions, especially in the workplace.   PLAN:  Option to try allergy vaccine if other measures fail to control.  She was comfortable with this approach.     Clinton D. Maple Hudson, MD, Tonny Bollman, FACP  Electronically Signed    CDY/MedQ  DD: 03/27/2007  DT: 03/28/2007  Job #: [id number]

## 2010-09-05 NOTE — Assessment & Plan Note (Signed)
[facility]                             PULMONARY OFFICE NOTE   [name], [name]                         MRN:          [id number]  DATE:12/25/2006                            DOB:          Nov 17, 1965    PROBLEM:  1. Atopic asthma.  2. Allergic rhinitis.   HISTORY:  Dramatically better after prednisone taper.  Voice has  cleared, which tells [place] there is not a residual stridor problem from her  prior intubation.  We note that she failed Xolair with a sense of throat  tightness.  She says her peak flow is now back up around 375.  She comes  today for skin testing, feeling well.   MEDICATIONS:  1. Symbicort 160/4.5.  2. Albuterol rescue inhaler two puffs q.i.d. p.r.n.  3. Home nebulizer with Xopenex 1.25 mg not currently being needed.  4. Zyrtec 10 mg p.r.n.  5. Singulair 10 mg.  6. Veramyst.   DRUG INTOLERANCES:  PHENERGAN with hallucinations, XOLAIR.   OBJECTIVE:  Weight 231 pounds.  BP 118/80, pulse 77, room air saturation  100%.  Minimal cough.  She rubbed her eyes a little bit without erythema  or tearing.  Nasal airway was clear.  Chest clear.  Heart sounds normal.   SKIN TEST:  Significant positive puncture an intradermal reactions for a  broad spectrum of grass, weed and tree pollens, dust, dust mite and  molds.   IMPRESSION:  Skin tests support impression of a significant atopic  component previously suggested by elevated IgE under history.  She is  now doing quite well, having responded nicely to prednisone after an  exacerbation.   PLAN:  1. She may use Zyrtec in the evening, which she does to avoid      drowsiness and I have given her permission to occasionally add a      Claritin in the morning if needed.  2. Environmental precaution information was discussed and print      information given.  3. She is doing well now and we will watch with attention to how long      she remains clear.  4. We discussed allergy vaccine as a  treatment option, reviewing risk,      benefit, realistic      expectations and time expectations.  5. Schedule return in 2-3 months, earlier p.r.n.     Clinton D. Maple Hudson, MD, Tonny Bollman, FACP  Electronically Signed    CDY/MedQ  DD: 12/25/2006  DT: 12/26/2006  Job #: [id number]

## 2010-09-05 NOTE — Discharge Summary (Signed)
[name], [name]                ACCOUNT NO.:  [id number]   MEDICAL RECORD NO.:  [id number]          PATIENT TYPE:  INP   LOCATION:  1420                         FACILITY:  [facility]   PHYSICIAN:  Valerie A. Felicity Coyer, MDDATE OF BIRTH:  1965/12/19   DATE OF ADMISSION:  05/03/2007  DATE OF DISCHARGE:  05/05/2007                               DISCHARGE SUMMARY   DISCHARGE DIAGNOSES:  1. Acute asthma exacerbation, much improved.  2. History of gastroesophageal reflux disease.  3. History of allergic rhinitis.  4. History of total vaginal hysterectomy.  5. Mild hyperglycemia, likely steroid induced, outpatient follow-up.   DISCHARGE MEDICATIONS:  1. Prednisone 10 mg tablets to take as directed to include 60 mg daily      x1 day, then 50 mg daily x1 day, then 40 mg daily x1 day, then 30      mg daily x1 day, then 20 mg daily x1 day, then 10 mg daily x1 days,      then stop.  2. Other medications are as prior to admission without change and      include      a.     Zyrtec 10 mg p.o. nightly.      b.     Protonix 40 mg b.i.d.      c.     Singulair 10 mg daily.      d.     Albuterol MDI 2 puffs q.4h. p.r.n.      e.     Symbicort MDI 160/80 b.i.d.      f.     Tussionex 5 mL p.o. b.i.d. p.r.n. cough.      g.     Xopenex MDI 2 puffs q.4h. p.r.n.   DISPOSITION:  The patient is discharged home in medically stable and  improved condition.  Hospital follow-up is scheduled with her primary  care physician, Dr. Illene Regulus, for this week, Thursday, May 08, 2007, at 11:10 a.m.  At that time, if there are no further problems  nearing the end of her steroid completion, she should be clear for  return to work on Monday, May 12, 2007, but will defer to primary MD  regarding the exact timing of this.   HOSPITAL COURSE BY PROBLEM:  ACUTE ASTHMA EXACERBATION:  The patient is  a 45 year old woman with history of asthma including intubation in 2003  for an acute attack, came to the emergency  room feeling short of breath  with similar symptoms to that prior to intubation.  She had made calls  to her pulmonologist, who recommended ER evaluation.  Due to a decrease  in her peak flowmeter readings and symptoms, she was given dexamethasone  in the emergency room with good relief, however, given the severity of  her complaint, she was referred for admission.  She was continued on  nebulizers, supplemental oxygen and prednisone 60 mg daily which  provided continued sustained release of her symptoms.  By the following  morning she felt much improved and was anxious for discharge home.  She  agreed to a prednisone taper as described  above and continued rest at  home to allow adequate time for recuperation.  She is instructed to call  MD or return to the emergency room if other troubled  breathing or problems prior to follow-up and follow-up has been arranged  for this week, Thursday, May 08, 2007, with her primary MD in the  next 72 hours.  These plans have been reviewed with the patient who  understands and agrees.  Her other medications for chronic medical  conditions are as listed above and are without change.      Valerie A. Felicity Coyer, MD  Electronically Signed     VAL/MEDQ  D:  05/05/2007  T:  05/05/2007  Job:  [id number]
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
## 2010-09-05 NOTE — Assessment & Plan Note (Signed)
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 who presents because of increased  difficulties with dyspnea, and increased use of albuterol over the last  week.  She was exposed, apparently, to some lilies.  She does have  difficulties with flowers and plant material.  She is asthmatic, and  does have an Ig mediated component.  She has failed Xolair in the past  because of adverse reaction.  She denies any fevers, chills, or sweats.  She has not had any purulent sputum production.  No fevers, chills, or  sweats.  The has had a dry cough.   CURRENT MEDICATIONS:  As noted on the intake sheet.  These have been  reviewed, and are accurate.   PHYSICAL EXAMINATION:  VITALS:  As noted.  Oxygen saturation is 97% on  room air.  GENERAL:  This is an obese [place] American female who is in no acute  distress.  HEENT EXAMINATION:  Remarkable for mild rhinitis changes.  NECK:  Supple.  No adenopathy noted.  No JVD.  LUNGS:  With poor air movement bilaterally.  No wheezes noted, but  again, air movement is very poor.  CARDIAC EXAMINATION:  Regular rate and rhythm.  No rubs, murmurs, or  gallops heard.  EXTREMITIES:  Patient has no cyanosis, no clubbing, no edema noted.   I did perform spirometry today.  The patient performed variably due to  cough.  However, on the independent portion, she is noted to have  diminished FEV1 on 1.60 L, or 53% of predicted with an FV1 of 59%.   Patient received Xopenex 1.25 mg along with Pulmicort 1 mg nebulized.  Examination of the lungs after this revealed the patient was moving air  better.  No wheezes noted.  Air entry was excellent bilaterally.   IMPRESSION:   Flareup of asthma triggered by environmental allergies.   PLAN:  1. Will be to increase her Symbicort to 160/4.5 two inhalations twice      a day.  2. Continue use of Xopenex p.r.n.  3. We contemplated taper of steroids, however, she did respond very      well to nebulized medication, and she would prefer avoidance of      prednisone.  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  4. Followup will be in 2 weeks' time with our nurse practitioner,      Tammy Parrett.  Subsequently      after that, she will be referred to Dr. Jetty Duhamel for further      evaluation of her allergies, and management of her asthma.  This,      given the fact that I will be leaving the practice.  Gailen Shelter, MD  Electronically Signed    CLG/MedQ  DD: 08/20/2006  DT: 08/20/2006  Job #: [id number]

## 2010-09-05 NOTE — H&P (Signed)
NAMETYTEANNA, OST NO.:  [id number]   MEDICAL RECORD NO.:  [id number]          PATIENT TYPE:  INP   LOCATION:  1420                         FACILITY:  [facility]   PHYSICIAN:  Therisa Doyne, MD    DATE OF BIRTH:  1966/03/21   DATE OF ADMISSION:  05/03/2007  DATE OF DISCHARGE:                              HISTORY & PHYSICAL   This is an admission to the [employer].   PRIMARY CARE Griffen Frayne:  Illene Regulus, MD.   CHIEF COMPLAINT:  Shortness of breath.   HISTORY OF PRESENT ILLNESS:  A 45 year old African-American female with  a past medical history of asthma as well as a history of intubation in  2002 who presented to the emergency department with shortness of breath.  Yesterday the patient states that she was exposed to secondhand smoke  and since that time she has had worsening shortness of breath.  She has  had to use her Xopenex inhaler and nebulizers more frequently.  Tonight  she became acutely short of breath, had a decrease in her peak flows to  less than 100.  Her peak flows typically run in the 300 range.  Because  of this she came to the emergency department for evaluation.  In the  emergency department she was given dexamethasone and albuterol and is  symptomatically improved.   REVIEW OF SYSTEMS:  All systems are reviewed and are negative except as  mentioned above in the History of Present Illness.   PAST MEDICAL HISTORY:  1. Asthma.  2. Gastroesophageal reflux disease.  3. Allergic rhinitis.  4. Status post total vaginal hysterectomy.   MEDICATIONS:  1. Symbicort two puffs b.i.d.  2. Zyrtec 10 mg daily.  3. Protonix 40 mg daily.  4. Singulair 10 mg daily.  5. Xopenex p.r.n.   SOCIAL HISTORY:  1. 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  2. She denies tobacco.  3. She drinks alcohol on occasion.   FAMILY HISTORY:  Negative for asthma.   ALLERGIES:  PHENERGAN.   REVIEW OF SYSTEMS:  All systems reviewed and  negative except as  mentioned above in the History of Present Illness.   PHYSICAL EXAM:  VITAL SIGNS:  Temperature 97.7, blood pressure 128/96,  pulse 48, respirations 100, oxygen saturation 100% on room air.  Those  were her triage vital signs.  Currently, she is sating 98% on room air  and breathing 12 times per minute.  GENERAL:  No acute distress.  HEENT:  Normocephalic, atraumatic.  Oropharynx pink and moist without  any lesions.  NECK:  Supple.  No lymphadenopathy, no jugular venous distention, no  masses.  CARDIOVASCULAR:  Tachycardic but regular rhythm.  No murmurs, rubs or  gallops.  CHEST:  Clear to auscultation bilaterally.  There is no wheezing and  good air movement throughout.  ABDOMEN:  Positive bowel sounds, soft, nontender, nondistended.  EXTREMITIES:  No clubbing, cyanosis or edema.  SKIN:  No rashes.   CBC and BNP within normal limits.  Arterial blood gas revealed a pH of  7.39, pCO2 of 43, pO2  of 75.   CHEST X-RAY:  Showed low lung volumes, questionable atelectasis.  There  was no consolidation, no pulmonary edema.   ASSESSMENT AND PLAN:  A 45 year old African-American female with a past  medical history significant for asthma and allergic rhinitis who  presented to the emergency department with an acute asthma exacerbation.  1. Will admit the patient to the [facility] to a      telemetry bed with continuous pulse oximetry monitoring.  2. Asthma exacerbation.  The patient is clinically improved here in      the emergency department on dexamethasone and Xopenex.  Will      continue on supplemental oxygen, give her prednisone 60 milligrams      once now and then schedule this daily.  Continue on Symbicort.  We      will schedule her on Xopenex metered dose inhaler every 4 hours and      she will have Xopenex nebulizer treatments every 2 hours as needed.      Continue on Zyrtec, Singulair and Protonix.  3. Fluids, electrolytes and nutrition.  IV  fluids, electrolytes are      stable, regular diet.  4. Prophylaxis.  For deep vein thrombosis prophylaxis Lovenox.      Therisa Doyne, MD  Electronically Signed     SJT/MEDQ  D:  05/04/2007  T:  05/04/2007  Job:  [id number]

## 2010-09-05 NOTE — Assessment & Plan Note (Signed)
[facility]                             PULMONARY OFFICE NOTE   [name], [name]                         MRN:          [id number]  DATE:02/12/2007                            DOB:          03/06/1966    I saw [name] in followup today for her asthma and allergic rhinitis.   Since her last visit with me she has been evaluated by Dr. Fannie Knee  and is currently undergoing further allergy evaluation although [name] says that she is quite reluctant to consider allergy shots.  She  was seen by my nurse practitioner, Tammy Parrett, approximately a week  ago with an asthma exacerbation.  She was given a course of Omnicef,  Tussionex and a Depo-Medrol injection and she says that her symptoms  have improved somewhat.  She is only using the Veramyst on an as-needed  basis and has not really been using her nasal irrigation much.  She does  complain of neck pain and stiffness as well as a pins and needles and  numb feeling going down her right arm.   CURRENT MEDICATIONS:  1. Symbicort 160/4.5 two puffs b.i.d.  2. Zyrtec 10 mg nightly.  3. Protonix 40 mg b.i.d.  4. Singulair 10 mg nightly.  5. Veramyst as needed.  6. She says she is only using her albuterol inhaler about 3-4 times a      week now.   PHYSICAL EXAMINATION:  Temperature is 98.2, blood pressure is 130/90,  heart rate 73, O2 saturation 100% on room air.  HEENT:  There is no sinus tenderness, she has a clear nasal discharge  with mild erythema of the posterior pharynx.  There is no  lymphadenopathy, she has limited range of motion in her neck.  She has  tenderness over her posterior neck.  HEART:  With S1 and S2.  CHEST:  There was no wheezing.  ABDOMEN:  Soft, nontender.  EXTREMITIES:  No edema.  GENERAL:  She does have 5/5 strength.   IMPRESSION:  1. Asthma.  I will have her continue on her current inhaler regimen.      I will also advise her to follow up with Dr. Maple Hudson to review  the      results of her allergy testing and determine if any other      interventions would be beneficial.  2. Allergic rhinitis.  I have advised her to use her Veramyst on a      regular basis, I have also discussed with her the use of nasal      irrigation.  3. Neck pain with right arm numbness.  I will arrange for her to      undergo cervical spine films and then I have asked her to follow up      with Dr. Debby Bud for further assessment of this.     Coralyn Helling, MD  Electronically Signed    VS/MedQ  DD: 02/12/2007  DT: 02/13/2007  Job #: [id number]   cc:   Gina Gess. Norins, MD  Clinton D. Maple Hudson, MD, FCCP, FACP
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
## 2010-09-05 NOTE — Assessment & Plan Note (Signed)
[facility]                             PULMONARY OFFICE NOTE   [name], [name]                         MRN:          [id number]  DATE:02/04/2007                            DOB:          09-30-1965    HISTORY OF PRESENT ILLNESS:  The patient is a 45 year old African-  American female patient of Dr. Roxy Cedar who has a known history of  allergic rhinitis and atopic asthma that presents today for an acute  office visit.  The patient complains over the last week she has had  cough, congestion, wheezing, shortness of breath, fever, T-max of 102,  with thick, yellow sputum.  The patient denies any hemoptysis,  orthopnea, PND or leg swelling.   PAST MEDICAL HISTORY:  Reviewed.   CURRENT MEDICATIONS:  Reviewed.   PHYSICAL EXAMINATION:  The patient is a pleasant female in no acute  distress.  Her temperature is 99.2, blood pressure is 140/80, O2 saturation is 98%  on room air.  HEENT:  Unremarkable.  NECK:  Supple without cervical adenopathy.  No JVD.  Lung sounds reveal coarse breath sounds bilaterally with a few  expiratory wheezes.  CARDIAC:  Regular rate and rhythm.  ABDOMEN:  Soft and nontender.  EXTREMITIES:  Warm without any edema.   IMPRESSION/PLAN:  Acute asthmatic bronchitic exacerbation.  The patient  is to begin Omnicef x7 days, Mucinex DM twice daily.  May use tramadol  or Tussionex for breakthrough coughing.  Patient is given a Depo-Medrol  120 IM injection today in the office.  The patient is to return back  with Dr. Maple Hudson in 2 weeks as scheduled or sooner if needed.      Rubye Oaks, NP  Electronically Signed      Clinton D. Maple Hudson, MD, Tonny Bollman, FACP  Electronically Signed   TP/MedQ  DD: 02/04/2007  DT: 02/05/2007  Job #: [id number]
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
## 2010-09-05 NOTE — Assessment & Plan Note (Signed)
[facility]                             PULMONARY OFFICE NOTE   ZO, LOUDON                         MRN:          [id number]  DATE:11/15/2006                            DOB:          1966/02/11    I am seeing [name] today for evaluation of her asthma.   She was previously under the care of Dr. Gailen Shelter and I will be  assuming her care for now.  She has apparently had difficult to control  asthma with frequent flares.  She says that her typical flares are  associated with environmental exposures such as flowers and grass and  dogs as well as strong scents.  She had been tried on Xolair previously  but apparently had an adverse reaction in which she felt like her throat  was closing and this was subsequently discontinued.  She was scheduled  to be evaluated by Dr. Maple Hudson for allergy shots but apparently had a  flare of her asthma at that time and was not able to make that  appointment.  She says that last week she had an exposure at work with  some cleaning solutions and had a flare of her asthma.  She had some  prednisone at home which she took on her own which helped to some  degree.  She says that she is starting to feel somewhat better now.  She  says that her peak flows are typically around 350-400 but was as low as  175 last week.  She has not checked it since then.  She again says she  is feeling reasonably okay.  She does have occasional postnasal drip and  occasional cough which is dry.  She is not currently having any fevers,  chills, or sweats.  She is not having much as far as sputum production.  And, she denies any hemoptysis.  She also denies any abdominal pain or  skin rashes.   CURRENT MEDICATIONS:  1. Symbicort 160/4.5, two puffs b.i.d.  2. Zyrtec 10 mg q.h.s.  3. Protonix 40 mg b.i.d.  4. Singulair 10 mg q.h.s.  5. Veramyst nasal spray as needed which she says she does not like to      use.  6. She is also on  Lasix 40 mg p.r.n. for her pseudotumor cerebri.  7. Patanol eye drops, 2 drops as needed.  8. She has an Epipen as needed.  9. Albuterol inhaler as needed.  10.Mucinex as needed.  11.Tramadol as needed.  12.Tussionex as needed.   PHYSICAL EXAMINATION:  VITAL SIGNS:  She is 228 pounds, temperature is  98.3, blood pressure is 120/90, heart rate is 87, oxygen saturation 95%  on room air.  HEENT:  There was no sinus tenderness.  No oral lesions.  No  lymphadenopathy.  HEART:  S1 S2.  CHEST:  No wheezing or rales.  She had good air entry.  ABDOMEN:  Soft, nontender.  EXTREMITIES:  No edema.   IMPRESSION:  Refractory asthma.   I will continue her on her current inhaler regimen.  I discussed with  her  again trying to use nasal irrigation as well as trying to further  optimize her sinus regimen.  I will make arrangements for her to have an  evaluation by Dr. Jetty Duhamel to determine whether she would be a  candidate for allergy shots.  I have written a prescription for her  Tussionex.   I will follow up with her in approximately 3-4 months.     Coralyn Helling, MD  Electronically Signed    VS/MedQ  DD: 11/15/2006  DT: 11/15/2006  Job #: [id number]   cc:   Joni Fears D. Maple Hudson, MD, FCCP, FACP

## 2010-09-08 NOTE — Assessment & Plan Note (Signed)
[facility]                             PULMONARY OFFICE NOTE   [name], [name]                         MRN:          [id number]  DATE:06/13/2006                            DOB:          [birth year]    [name] is a 45 year old black woman with a past medical history  significant for asthmatic bronchitis, GERD, vocal cord dysfunction, who  comes today for a regular followup.  On January of this year, she had  several exacerbations of her asthma that were treated with different  courses of antibiotics and p.o. steroids.  At that point, she was  aggressively treated for her reflux and, since then, she has been much  better.  She said she does not have any complaint today, that she has  not been using albuterol at all except for 1 opportunity last week when  she was exposed to some tobacco smoke, but since then she has been doing  okay.  She denies any fever or chills, chest pain, shortness of breath,  or wheezing.   MEDICATIONS:  Reviewed.   PAST MEDICAL HISTORY:  Reviewed.   PHYSICAL EXAM:  Weight 228, temperature 98.3, blood pressure 138/82,  pulse 82, oxygen saturation 97% on room air.  This is a very pleasant woman in no acute distress.  HEENT:  Pharynx is clear with no exudate.  NECK:  Supple.  No JVD.  No thyromegaly.  LUNGS:  Clear to auscultation bilaterally with good air movement.  LOWER EXTREMITIES:  With no edema.   IMPRESSION:  Asthma with vocal cord dysfunction and gastroesophageal  reflux disease, which at this point are well-controlled on present  treatment.   PLAN:  She is to continue on Symbicort 80/4.5 two puffs b.i.d., Protonix  40 mg p.o. b.i.d., Singulair 10 mg p.o. daily, and albuterol on a p.r.n.  basis.  Zyrtec 10 mg p.o. daily.  She is to come back in 3 more months  for her regular followup or earlier if that is needed.      Dennis Bast, MD      C. Danice Goltz, MD  Electronically Signed   Rivka Safer  DD:  06/13/2006  DT: 06/13/2006  Job #: [id number]

## 2010-09-08 NOTE — Op Note (Signed)
[name], [name]                            ACCOUNT NO.:  [id number]   MEDICAL RECORD NO.:  [id number]                   PATIENT TYPE:  AMB   LOCATION:  DSC                                  FACILITY:  [facility]   PHYSICIAN:  Earvin Hansen L. Shon Hough, M.D.           DATE OF BIRTH:  1966/01/14   DATE OF PROCEDURE:  12/20/2003  DATE OF DISCHARGE:                                 OPERATIVE REPORT   INDICATION:  [age]-year-old lady with severe macromastia, back and  shoulder pain secondary to large pendulous breasts, increased intertriginous  changes not amenable to conservative treatment and not responsive.   PROCEDURES PLANNED:  Bilateral breast reductions using the inferior pedicle  technique.   SURGEON:  Yaakov Guthrie. Shon Hough, M.D.   ANESTHESIA:  General.   DESCRIPTION OF PROCEDURE:  Preoperatively, the patient was sat up and drawn  for the inferior pedicle reduction mammoplasty, remarking the nipple-areolar  complexes back to 20 cm from the suprasternal notch.  She then underwent  general anesthesia and intubated orally.  Prep was done to the chest and  breast areas in routine fashion using Betadine soaping solution and walled  off with sterile towels and drapes so as to make a sterile field.  Xylocaine  0.25% with epinephrine was injected locally for vasoconstriction in  1:400,000 concentration, 150 mL per side.  The wounds were scored with #15  blades and the skin over the inferior pedicles was de-epithelized with #20  blades.  Medial and lateral fatty dermal pedicles were excised down to  underlying fascia, hemostasis again maintained with the Bovie unit on  coagulation.  New keyhole areas were also debulked.  Out laterally, more  accessory breast tissue was removed using the Bovie unit on coagulation.  After proper hemostasis, the inferior pedicles were trimmed appropriately  and the flaps were then transposed and stayed with 3-0 Prolene.  Subcutaneous closure was done with 3-0  Monocryl x2 layers, then running  subcuticular stitches of 3-0 Monocryl and 5-0 Monocryl throughout the  inverted T.  The wounds were cleansed and the wounds were drained with #10  fully fluted Blake drains which were placed in the depths of the wound and  brought out the lateral-most portion of the incisions and secured with 3-0  Prolene.  Steri-Strips and soft dressings were applied including Xeroform, 4  x 4's, ABDs and Hypafix tape.  She withstood the procedures very well and  was taken to recovery in excellent condition.   ESTIMATED BLOOD LOSS:  Less than 100 mL.   COMPLICATIONS:  None.                                               Yaakov Guthrie. Shon Hough, M.D.    Cathie Hoops  D:  12/20/2003  T:  12/20/2003  Job:  [id number] 

## 2010-09-08 NOTE — Assessment & Plan Note (Signed)
[facility]                             PULMONARY OFFICE NOTE   [name], [name]                         MRN:          [id number]  DATE:07/02/2006                            DOB:          [birth year]    HISTORY OF PRESENT ILLNESS:  Patient is a 45 year old African American  female patient of Dr. Jayme Cloud with a known history of significant  asthmatic bronchitis, gastroesophageal reflux, and vocal cord  dysfunction, presents for an acute office visit. Patient complains that  yesterday she developed an acute onset of fever T-Max of 103 with body  aches and now she has developed a cough with some yellow-green sputum.  Patient denies any hemoptysis, orthopnea, PND, or recent travel. Patient  is a Engineer, civil (consulting) on a medical floor and reports that several co-workers and  staff have had the flu. She denies any hemoptysis, orthopnea, wheezing,  or leg swelling.   PAST MEDICAL HISTORY:  Reviewed.   CURRENT MEDICATIONS:  Reviewed.   PHYSICAL EXAMINATION:  Patient is a pleasant female in no acute  distress. Temperature is 99.1, blood pressure 120/82, O2 saturation is  98% on room air.  HEENT: Unremarkable.  NECK: Supple without cervical adenopathy. No JVD.  LUNG: Lung sounds are clear.  CARDIAC: Regular rate.  ABDOMEN: Soft and nontender.  EXTREMITIES: Warm without any edema.   IMPRESSION AND PLAN:  Acute influenza. Patient is to add in Mucinex DM  twice. We will give againTamiflu 75 mg b.i.d. time 5 days. Patient is to  follow up back up as scheduled or sooner if needed. Patient may also use  Tylenol and Advil as needed for body aches and fever. Is to increase her  fluid intake. And will return back her as scheduled or sooner if needed.      Rubye Oaks, NP  Electronically Signed      Gailen Shelter, MD  Electronically Signed   TP/MedQ  DD: 07/04/2006  DT: 07/05/2006  Job #: [id number]

## 2010-09-08 NOTE — Assessment & Plan Note (Signed)
[facility]                               PULMONARY OFFICE NOTE   [name], [name]                         MRN:          [id number]  DATE:03/07/2006                            DOB:          April 21, 1966    This is a 45 year old African-American female who I follow here for mild  persistent asthma.  She presents for follow up.  She has had difficulties  with increasing dyspnea over the last 2-3 days prior to this visit.  She has  had to use her nebulizers on an increasing basis since the 9th of November.  She has been coughing up thick greenish mucus.  She denies any fevers,  chills or sweats.  She has had no chest pain.   CURRENT MEDICATIONS:  As noted on the Intake Sheet.  These have been  reviewed and are accurate.   PHYSICAL EXAM:  VITAL SIGNS:  Noted.  Oxygen saturation was 97% on room air.  GENERAL:  This is an ambulatory African-American female, moderately obese,  who is in no acute distress.  HEENT:  Mild rhinitis changes.  NECK:  Supple, no adenopathy noted, no JVD.  LUNGS:  Very distant sounding.  She does have some faint end-expiratory  wheezes.  CARDIAC:  Regular rate, rhythm.  No rubs, murmurs or gallops.  EXTREMITIES:  Patient has no cyanosis, no clubbing, no edema noted.   IMPRESSION:  Acute exacerbation of asthmatic bronchitis.   PLAN:  1. We did obtain chest x-ray today which showed no acute infiltrate.  2. We switched her Foradil to Asmanex to Symbicort 160/4.5 two inhalations      twice a day.  She was instructed on appropriate use of the same.  3. She will receive a prednisone taper.  4. She will receive also an azithromycin Z-Pak.  5. Follow up will be in 1-2 weeks time with myself or my nurse      practitioner Tammy Parrett.  She is to contact us prior to that time      should any new problems arise.     Gailen Shelter, MD  Electronically Signed    CLG/MedQ  DD: 03/08/2006  DT: 03/08/2006  Job #: [id number]

## 2010-09-08 NOTE — Discharge Summary (Signed)
[facility]  Patient:    [name], [name] Visit Number: [id number] MRN: [id number]          Service Type: MED Location: 3W [phone] Attending Physician:  Gailen Shelter Dictated by:   Earley Favor, RN, MSN, ACNP Admit Date:  09/18/2001 Disc. Date: 09/22/01   CC:         Lenon Curt. Cassell Clement, M.D.  Marlan Palau, M.D.   Discharge Summary  DATE OF BIRTH:  1965/07/23.  DISCHARGE DIAGNOSES: 1. Continued exacerbation of asthma. 2. Vocal cord dysfunction. 3. Gastroesophageal reflux disease. 4. Steroid induced diabetes mellitus.  HISTORY OF PRESENT ILLNESS:  The patient is a 45 year old black female who was self referred to Dr. Jayme Cloud for asthma.  The patient has a long history of mild asthma requiring albuterol p.r.n., and he also has seasonal allergies and he uses Claritin and Benadryl p.r.n.  The patient went to [facility] and was exposed to cut grass which set off her allergies and asthma.  The patient was seen in the ER at that time and treated with nebulizers and terbutaline along with magnesium and discharged.  She was admitted from Sep 13, 2001 to Sep 14, 2001.  She was treated with a steroid taper and a Z-Pak.  She is not improving in her symptoms and now has become hoarse and was admitted for further evaluation and treatment on Sep 18, 2001.  LABORATORY DATA:  WBCs 27.1, hemoglobin 12.1, hematocrit 36.6, platelets 371. Arterial blood gases on room air pH 7.45, pCO2 34, pO2 91.  Sodium 131, potassium 4.5, chloride 108, CO2 18, glucose 201, BUN is 15, creatinine 1.2, calcium 9.4.  CHEST X-RAY:  Shows no active disease.  HOSPITAL COURSE: #1 - ACUTE EXACERBATION OF ASTHMATIC BRONCHITIS:  The patient was admitted to [facility] and treated with her usual pharmaceutical interventions consisting of IV steroids along with nebulizer bronchodilators and mechanical intervention with a flutter valve.  She reached her maximum  hospital benefit by September 22, 2001, and was discharged home in improved condition.  Steroids were changed to a steroid taper with prednisone and she will be continued on Serevent and pulmicort secondary to hoarseness before returning to advair.  #2 - MILD VOCAL CORD DYSFUNCTION:  She is continued on protonix.  #3 - GASTROESOPHAGEAL REFLUX DISEASE:  This is being addressed with b.i.d. protonix.  She also has a history of pseudotumor cerebri.  This is treated with Lasix and Diamox and followed by Dr. Lesia Sago.  The last time she was followed with Dr. Lesia Sago was approximately 1 year ago.  #4 - HYPERGLYCEMIA SECONDARY TO STEROIDS:  She is in a no concentrated sweet diet and her steroids are being weaned on discharge.  MEDICATIONS: 1. Pulmicort 4 puffs b.i.d. 2. Serevent 2 puffs b.i.d. 3. Zyrtec 10 mg q.h.s. 4. Lasix 40 mg b.i.d. 5. Diamox 500 mg t.i.d. 6. Protonix 40 mg 2 times a day. 7. Prednisone 10 mg 40 mg for 3 days, 30 mg for 3 days, 20 mg for 3 days, 10    mg for 3 days and then stop.  DIET:  1800 calorie [name] diet.  FOLLOWUP: She has a follow up appointment with Nurse Practitioner Minor at September 25, 2001, at 2:15 p.m.  DISPOSITION:  She is being discharged in improved condition. Dictated by:   Earley Favor, RN, MSN, ACNP Attending Physician:  Gailen Shelter DD:  09/22/01 TD:  09/22/01 Job: [id number]

## 2010-09-08 NOTE — Op Note (Signed)
NAMELYNNEA, VANDERVOORT                ACCOUNT NO.:  [id number]   MEDICAL RECORD NO.:  [id number]          PATIENT TYPE:  OIB   LOCATION:  9108                          FACILITY:  [facility]   PHYSICIAN:  Janine Limbo, M.D.DATE OF BIRTH:  1965-07-24   DATE OF PROCEDURE:  08/09/2005  DATE OF DISCHARGE:                                 OPERATIVE REPORT   PREOPERATIVE DIAGNOSIS:  1.  Menorrhagia.  2.  Fibroid uterus.  3.  Anemia (hemoglobin 11.7).  4.  Metrorrhagia.   POSTOPERATIVE DIAGNOSIS:  1.  Menorrhagia.  2.  Fibroid uterus.  3.  Anemia (hemoglobin 11.7).  4.  Metrorrhagia.   PROCEDURE:  Vaginal hysterectomy.   SURGEON:  Dr. Leonard Schwartz.   FIRST ASSISTANT:  Dr. Dierdre Forth.   ANESTHETIC:  Spinal.   DISPOSITION:  Ms. Tietje is a 45 year old female, para 0-1-1-1, who presents  with the above-mentioned diagnosis. She understands the indications for her  surgical procedure and she accepts the risks of, but not limited to,  anesthetic complications, bleeding, infection, and possible damage to  surrounding organs.   FINDINGS:  The uterus had multiple fibroids and there was a 4 to 5 cm  pedunculated fibroid on the fundus of the uterus. The weight was  approximately 230 grams. The fallopian tubes and ovaries appeared normal.   PROCEDURE:  The patient was taken to the operating room where spinal  anesthetic was given. The patient's abdomen, perineum, and vagina were  prepped with multiple layers of Hibiclens. Examination under anesthesia was  performed. The patient was then sterilely draped. A Foley catheter was  placed in the bladder. The cervix was injected with approximately 30 mL 0.5%  Marcaine with epinephrine. A circumferential incision was made around the  cervix and the vaginal mucosa was advanced both anteriorly and posteriorly.  The posterior cul-de-sac and the anterior cul-de-sac was sharply entered.  Alternating from right to left the uterosacral  ligaments, paracervical  tissues, parametrial tissues, and uterine arteries were clamped, cut,  sutured, and tied securely. The uterus was inverted through the posterior  colpotomy. The remainder of the upper pedicles were secured. The uterus was  transected from operative field. The upper pedicles were then free tied and  then suture ligated. The sutures attached to the uterosacral ligaments were  brought out through the vaginal angles and tied securely. A McCall  culdoplasty suture was placed in the posterior cul-de-sac incorporating the  uterosacral ligaments bilaterally and the posterior peritoneum. Hemostasis  was noted to be adequate throughout. This having been completed all  instruments were removed. The vaginal cuff was closed using figure-of-eight  sutures incorporating the anterior vaginal mucosa, the anterior peritoneum,  the posterior peritoneum, and then the posterior vaginal mucosa. The McCall  culdoplasty suture was tied securely and the apex of the vagina was noted to  elevate into the mid pelvis. 0 Vicryl was the suture material used  throughout the procedure. Sponge, needle, instrument counts were correct.  The estimated blood loss for the procedure was less than 100 mL. The patient  was noted to drain clear yellow  urine at the end of her procedure.  Approximately 675 mL of urine. The patient received 1 liter of IV fluids.  The patient was awakened from her anesthetic and taken to the recovery room  in stable condition. The patient has a history of asthma and she tolerated  her procedure extremely well.      Janine Limbo, M.D.  Electronically Signed     AVS/MEDQ  D:  08/09/2005  T:  08/10/2005  Job:  [id number]

## 2010-09-08 NOTE — H&P (Signed)
NAMECONLEY, PAWLING                ACCOUNT NO.:  [id number]   MEDICAL RECORD NO.:  [id number]          PATIENT TYPE:  AMB   LOCATION:  SDC                           FACILITY:  [facility]   PHYSICIAN:  Hal Morales, M.D.DATE OF BIRTH:  January 29, 1966   DATE OF ADMISSION:  04/19/2005  DATE OF DISCHARGE:                                HISTORY & PHYSICAL   HISTORY OF PRESENT ILLNESS:  The patient is a 45 year old black female, para  0-1-1-1, who presents for management of menorrhagia and irregular menses.  The patient has an 78-month history of increasingly heavy menstrual periods.  She has had a history of irregular menses for greater than 10 years.  She  has, in the past, used combination oral contraceptives and Depo-Provera for  regulation of her menses and management of her menorrhagia.  She complained  that she had weight gain with Depo-Provera.  She is currently using a  NuvaRing for contraception and plans to continue this.  Even with the  NuvaRing in place, however, she continues to have heavy menses, which can  sometimes last as much as 10 days.  Most often, however, her menses last  from 4-8 days with 4 days being very heavy with clots and bleeding through  her clothing.  Her last menstrual period began on March 30, 2005.  The  previous menstrual period had been 1 month prior.  Contraception - NuvaRing.   The patient also has a history of uterine fibroids and has had some episodes  of abdominopelvic pain.  These have primarily been associated with her  menses.   She has had a work-up including prolactin and TSH, which was within normal  limits, and an endometrial biopsy done in June of 2006 was benign.  She  presents for operative management of her menorrhagia.   PAST MEDICAL HISTORY:   GYNECOLOGIC:  Menarche at age 63 with menses every 28-32 days, lasting from  3-4 days up until about 18 months ago.  She does have uterine fibroids, with  her most recent ultrasound having been  done October 06, 2004 showing a uterus  that is 9.2 cm in length.  There was a 3.8 x 3.3 cm left pedunculated  uterine fibroid and 2 myometrial fibroids, the largest of which measured 4.3  x 3.8.  The ovaries were within normal limits.  The patient had Chlamydia in  1994, which was appropriately treated.   OBSTETRICAL HISTORY:  Preterm vaginal delivery in 1986 secondary to  preeclampsia.  Spontaneous abortion in 1994, not requiring D&C.   MEDICAL HISTORY:  1.  Pseudotumor cerebri diagnosed in 1997.  2.  Asthma diagnosed in 2003.   SURGICAL HISTORY:  Tonsillectomy in 1979.   FAMILY HISTORY:  Positive for diabetes mellitus, hypertension,  cerebrovascular accident and myocardial infarction.   CURRENT MEDICATIONS:  1.  Lasix.  2.  Diamox.  3.  Zyrtec.  4.  Foradil.  5.  Protonix.  6.  Singulair.  7.  Albuterol inhaler p.r.n.   DRUG ALLERGIES:  PHENERGAN causes hallucinations.   REVIEW OF SYSTEMS:  Positive for intermittent pelvic pain,  most often with  menses, and the aforementioned menorrhagia.   PHYSICAL EXAMINATION:  GENERAL:  The patient is a well-developed black  female in no acute distress.  VITAL SIGNS:  Blood pressure is 124/80.  LUNGS:  Clear.  HEART:  Regular rate and rhythm.  ABDOMEN:  Soft without masses or organomegaly.  There is mild tenderness  overlying the suprapubic region with deep palpation.  There is no rebound.  Bowel sounds are normal.  PELVIC:  EGBUS within normal limits.  The vagina was rugose.  The cervix was  without gross lesions.  The uterus is upper limits of normal size, mobile  and nontender.  Adnexa no masses.  RECTOVAGINAL:  No masses.   IMPRESSION:  1.  Menorrhagia.  2.  Irregular cycles with normal TSH and prolactin, as well as endometrial      biopsy.  3.  History of uterine fibroids.  4.  Pseudotumor cerebri.  5.  Asthma.   DISPOSITION:  A number of discussions have been held with the patient  concerning options for management of  her menorrhagia, which she outlines as  the most bothersome of her symptoms.  She has been told about the  possibility of using combination contraceptives, progesterone only  contraceptives, Mirena IUD, endometrial ablation and hysterectomy.  The  risks and benefits of each of these procedures has been discussed with the  patient, and she opts for endometrial ablation.  It has been discussed  specifically with her that the endometrial ablation is unlikely to address  the concerns she has with pelvic pain, and may not address the concerns she  has with irregularity of her menses, if she does not become completely  amenorrheic as a result of her ablation.  The patient verbalizes  understanding of this and wishes to proceed.  She likewise verbalizes  understanding of the risks of anesthesia, bleeding, infection, damage to  adjacent organs, and uterine perforation as risks of endometrial ablation.  She will thus undergo diagnostic hysteroscopy, then NovaSure endometrial  ablation at [facility] on April 19, 2005.      Hal Morales, M.D.  Electronically Signed     VPH/MEDQ  D:  04/18/2005  T:  04/19/2005  Job:  [id number]

## 2010-09-08 NOTE — Assessment & Plan Note (Signed)
[facility]                             PULMONARY OFFICE NOTE   [name], [name]                         MRN:          [id number]  DATE:04/05/2006                            DOB:          Apr 03, 1966    HISTORY:  This is a 45 year old black female never smoker who carries  the diagnosis of asthma, VCD and GERD. She says she had done well for  several months but has been seen with increasing frequency since  November with recurrent cough productive of thick green mucous and  increased nebulizer use. She comes in today having already been seen by  the nurse practitioner on November 29 and treated with Levaquin, asked  to take Protonix twice daily and given a course of prednisone. She says  that she was only transiently better on prednisone and now much worse  over the last several days with increasing sore throat, hacking cough  productive of minimum green sputum especially when she lies down early  in the morning associated with mild nasal congestion.   I tried to review her medications with her. There is very poor  correlation between the medications that we have listed and what she is  doing. One issue is that Dr. Jayme Cloud asked her to start on Symbicort  80/4.5 two puffs b.i.d. She said she takes it maybe once a day. She is  suppose to also be on b.i.d. Protonix and also takes that maybe once a  day.   PHYSICAL EXAMINATION:  GENERAL:  She is a pleasant minimally cushingoid  appearing ambulatory black female in no acute distress.  VITAL SIGNS:  She had stable vital signs. She does indeed have a very  harsh bark in quality upper airway cough but was not at all junky but  rather dry and harsh in quality. She is afebrile with stable vital  signs.  HEENT:  Reveals moderate __________  with nonspecific features.  Oropharynx clear.  NECK:  Supple without cervical adenopathy or tenderness. The trachea is  midline, no thyromegaly.  LUNGS:  Lung fields  perfectly clear bilaterally to auscultation and  percussion with minimal pseudowheeze after her last nebulizer treatment  within the last 2 hours.  HEART:  There is a regular rate and rhythm without murmur, gallop or  rub.  ABDOMEN:  Soft and benign.  EXTREMITIES:  Warm without calf tenderness, cyanosis, clubbing or edema.   Heme saturation 99% on room air.   Chest x-ray was reviewed from March 07, 2006 and was normal.   IMPRESSION:  I agree with Dr. Jayme Cloud that most of this patient's  problems are upper airway in nature. The green sputum suggests  rhinitis/sinusitis despite Levaquin and therefore I recommended  Augmentin 875 b.i.d. for 10 days with a followup sinus CT scan.   My greatest concern about this patient is that she is not registering  the recommendations that are made and they are not being reflected in  the [facility] on the front of our chart. I am going to ask her to come back  and see our nurse practitioner in two  weeks for full medication  reconciliation. In the meantime, I reviewed a GERD diet with her which  emphasizes the avoidance of mint and menthol containing lozenges with  the following maintenance recommendations:  Zyrtec 10 mg q.h.s.,  Singulair 10 mg q.p.m., Protonix 40 mg tablets taken perfectly regular  before the first and last meal and continue Symbicort as recommended by  Dr. Jayme Cloud 80/4.5 two puffs in the morning and two puffs at night with  one sample today.   If she is having a headache, she can use Lasix as previously instructed  by her neurologist. If she is wheezing or short of breath, she should  use albuterol either inhaler or neb format and for cough use Mucinex DM  supplemented by Tramadol 50 mg 1 every 4 hours p.r.n.   She is to call for a sinus CT scan before her followup visit in 2 weeks  if not improved.     Charlaine Dalton. Sherene Sires, MD, [facility]  Electronically Signed    MBW/MedQ  DD: 04/05/2006  DT: 04/05/2006  Job #: [id number]   cc:   Gailen Shelter, MD

## 2010-09-08 NOTE — H&P (Signed)
NAMEBLAIRE, PALOMINO NO.:  [id number]   MEDICAL RECORD NO.:  [id number]          PATIENT TYPE:  AMB   LOCATION:  SDC                           FACILITY:  [facility]   PHYSICIAN:  Janine Limbo, M.D.DATE OF BIRTH:  July 31, 1965   DATE OF ADMISSION:  08/09/2005  DATE OF DISCHARGE:                                HISTORY & PHYSICAL   HISTORY OF PRESENT ILLNESS:  [name] is a 46 year old female, para 0, 1-1-  1, who presents for a vaginal hysterectomy.  The patient has been followed  at the [facility]cology division of [facility] because of menorrhagia and irregular cycles.  The  patient has been treated with hormonal therapy as well as endometrial  ablation.  She continues to have difficulty.  The patient's most recent Pap  smear was within normal limits.  An endometrial biopsy was within normal  limits.  The patient had an ultrasound performed which showed a 10.6 x 7.5  cm uterus with fibroids noted, measuring 1.35 cm and 1.34 cm.   The patient has a history of uterine fibroids.   PAST MEDICAL HISTORY:  The patient has a history of asthma and pseudotumor  cerebri.  She had a tonsillectomy in 1979.   CURRENT MEDICATIONS:  Lasix, Diamox, Zyrtec, Foradil, Protonix, Singulair,  and albuterol inhaler p.r.n.   DRUG ALLERGIES:  PHENERGAN causes hallucinations.  The patient is also  allergic to BETADINE.   OBSTETRICAL HISTORY:  The patient had a preterm vaginal delivery in 1986  secondary to preeclampsia.  The patient had a miscarriage in 1994.   SOCIAL HISTORY:  The patient drinks alcohol socially.  She denies cigarette  use and other recreational drug use.   REVIEW OF SYSTEMS:  Noncontributory.   FAMILY HISTORY:  Noncontributory.   PHYSICAL EXAMINATION:  VITAL SIGNS:  Weight is 212 pounds.  Height is 5 feet  4 inches.  HEENT:  Within normal limits.  CHEST:  Clear.  HEART:  Regular rate and rhythm.  BREASTS:   Without masses.  ABDOMEN:  Nontender.  EXTREMITIES:  Within normal limits.  NEUROLOGIC:  Grossly normal.  PELVIC:  External genitalia is normal.  Vagina is normal.  Cervix is  nontender.  Uterus is upper limits of normal size and nontender.  Adnexa:  No masses are appreciated.  Rectovaginal confirms.   ASSESSMENT:  1.  Menorrhagia.  2.  Irregular bleeding.  3.  Fibroid uterus.   PLAN:  The patient will undergo a vaginal hysterectomy.  She understands the  indications for her surgical procedure, and she accepts the risks of, but  not limited to, anesthetic complications, bleeding, infection, and possible  damage to the surrounding organs.      Janine Limbo, M.D.  Electronically Signed     AVS/MEDQ  D:  08/06/2005  T:  08/06/2005  Job:  [id number]

## 2010-09-08 NOTE — Op Note (Signed)
[place]  Patient:    [name], [name] Visit Number: [id number] MRN: [id number]          Service Type: OUT Location: [facility] Attending Physician:  Lesly Dukes Dictated by:   Marlan Palau, M.D. Proc. Date: 10/13/01 Admit Date:  10/13/2001   CC:         [facility]   Operative Report  INDICATIONS:  This patient is a 45 year old female with a history of pseudotumor cerebri.  The patient continues to have headache events and is being evaluated for effectiveness of treatment with medications.  DESCRIPTION OF PROCEDURE:  Lumbar puncture was performed with the patient in the fetal position on the right side.  The lower back was cleaned with Betadine solution and approximately 2 cc of 1% Xylocaine was used as a local anesthetic.  A 20 gauge spinal needle was inserted at the L3-4 interspace and approximately 18 cc of clear colorless spinal fluid was removed for testing. Opening pressure was 520 mmH2O.  Tube #1 was sent for VDRL Cryptococcal antigen.  Tube #3 was sent for cells, differential, glucose, and protein. Tubes #2 and #4 were saved.  There were no complications of the above procedure.  The patient tolerated the procedure well.  A prescription for Vicodin was given 30 tablets with no refills.  Plan is to change the patient to Tobramax, take off of Diamox in the future. Dictated by:   Marlan Palau, M.D. Attending Physician:  Lesly Dukes DD:  10/13/01 TD:  10/13/01 Job: [id number]

## 2010-09-08 NOTE — Op Note (Signed)
NAMEMERINDA, [name]                ACCOUNT NO.:  [id number]   MEDICAL RECORD NO.:  [id number]          PATIENT TYPE:  AMB   LOCATION:  SDC                           [facility]ILITY:  [facility]   PHYSICIAN:  Hal Morales, M.D.DATE OF BIRTH:  1965/10/20   DATE OF PROCEDURE:  04/19/2005  DATE OF DISCHARGE:                                 OPERATIVE REPORT   PREOPERATIVE DIAGNOSES:  1.  Menorrhagia.  2.  Uterine fibroids.   POSTOPERATIVE DIAGNOSES:  1.  Menorrhagia.  2.  Uterine fibroids.   OPERATION:  Diagnostic hysteroscopy, diagnostic dilatation and curettage,  NovaSure endometrial ablation.   SURGEON:  Hal Morales, M.D.   ANESTHESIA:  Spinal.   ESTIMATED BLOOD LOSS:  Less than 25 mL.   COMPLICATIONS:  None.   SPECIMENS TO PATHOLOGY:  Endometrial curettings.   FINDINGS:  The uterus sounded to 9 cm.  The cervical length was measured at  4 cm.  The cavity width was measured at 4.5 cm after seating of the NovaSure  endometrial ablation apparatus.   PROCEDURE:  The patient was taken to the operating room after appropriate  identification and placed on the operating table.  After placement of a  spinal anesthetic, she was placed in the lithotomy position.  The perineum  and vagina were prepped with multiple layers of Betadine and draped as a  sterile field.  A red Robinson catheter was used to empty the bladder.  A  Graves speculum was placed in the vagina and a single-tooth tenaculum placed  on the anterior cervix.  A paracervical block was achieved with a total of  10 mL of 2% Xylocaine in the 5 and 7 o'clock positions.  The cervix was  measured to 4 cm and the uterus sounded.  The cervix was then dilated to  accommodate the diagnostic hysteroscope and hysteroscopy undertaken with no  significant endometrial abnormality.  There was a slight irregularity in the  left anterior fundal region, but no definitive uterine fibroids could be  identified.  The hysteroscope was removed  and sharp curetting of all  quadrants of the uterus undertaken.  This specimen was removed from the  operative field.  The NovaSure endometrial ablation apparatus was then set  at 5 cm and placed into the endometrial cavity.  The NovaSure array was then  deployed and seated.  The seating was noted to measure the cavity width of  4.5 cm.  The first array which was placed malfunctioned  in that the  NovaSure apparatus would not document whether or not the cavity assessment  had been completed.  That array was then removed and a second handpiece  utilized to set at 5 cm cavity length and inserted into the uterine cavity.  That array was deployed and seated, allowing for a cavity width measurement  of 4.5 cm.  At that point the cavity assessment was undertaken and cavity  integrity indicated.  The treatment phase was then begun with a power of 124  and a treatment time of 1 minute 21 seconds.  The array was then removed  from the  endometrial cavity and the single-tooth tenaculum removed from the  cervix.  Hemostasis was achieved in that area with silver nitrate.  The  patient was taken from the operating room to the recovery room in  satisfactory condition having tolerated the procedure well, with sponge and  instrument counts correct.   SPECIMENS TO PATHOLOGY:  Endometrial curettings.      Hal Morales, M.D.  Electronically Signed     VPH/MEDQ  D:  04/19/2005  T:  04/19/2005  Job:  [id number]

## 2010-09-08 NOTE — Assessment & Plan Note (Signed)
[facility]                             PULMONARY OFFICE NOTE   MELANYE, HIRALDO                         MRN:          [id number]  DATE:04/22/2006                            DOB:          09/28/1965    HISTORY OF PRESENT ILLNESS:  This is a 45 year old, African-American  female patient of Dr. Jayme Cloud' who has a known history of asthma, vocal  cord dysfunction, and reflux.  The patient was seen approximately 2  weeks ago for an acute asthmatic bronchitis and sinusitis.  She was  treated with 10 days of Augmentin.  The patient reports symptoms did  improve; however, over the last 2 days she has returned with a cough and  congestion.  The patient denies any fever, purulent sputum, chest pain,  orthopnea, PND or leg swelling.   PAST MEDICAL HISTORY:  Reviewed.   CURRENT MEDICATIONS:  Reviewed.   PHYSICAL EXAM:  The patient is a pleasant female in no acute distress.  She is afebrile with stable vital signs. O2 saturation is 97% on room  air.  HEENT:  Nasal mucosa is erythematous.  Nontender sinuses to percussion.  TMs normal.  NECK:  Supple without adenopathy.  LUNGS:  Reveal coarse breath sounds bilaterally with some upper airway  pseudowheezing.  EXTREMITIES:  Warm without any cyanosis, clubbing, or edema.   IMPRESSION AND PLAN:  Asthmatic bronchitis with recent sinusitis.  The  patient has now finished 10 days of Augmentin.  The patient has been on  multiple antibiotics and prednisone tapers recently.  The patient will  undergo a CT sinuses prior to prescribing any further antibiotics.  We  will hold on steroids at this time.  The patient is to continue on her  present regimen and we will follow back up here in 2 weeks for a  complete medication review or sooner if needed.      Rubye Oaks, NP  Electronically Signed      Gailen Shelter, MD  Electronically Signed   TP/MedQ  DD: 04/24/2006  DT: 04/24/2006  Job #: [id number]

## 2010-09-08 NOTE — H&P (Signed)
[name], [name]                            ACCOUNT NO.:  [id number]   MEDICAL RECORD NO.:  [id number]                   PATIENT TYPE:  AMB   LOCATION:  DSC                                  FACILITY:  [facility]   PHYSICIAN:  Earvin Hansen L. Shon Hough, M.D.           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   PROCEDURE:  Liposuction while under general anesthesia.   DESCRIPTION OF PROCEDURE:  Preoperatively, the patient was stood up and  drawn for all of the areas of concern with circles and then she underwent  general anesthesia intubated orally.  Prep was done to the belly area with  Hibiclens solution and soap, walled off with sterile towels and drapes so as  to make a sterile field.  Tumescent 1000 mL was injected locally into the  area.  She was allowed to sit up.  Liposuction was then fashioned on the  lower, upper, and lateral abdominal areas using a [place] catheter 28-3 and 4  size.  She tolerated the procedures very well after removing a significant  amount of fat.  The wounds were then covered with a sterile dressing.  Abdominal support was placed.  She tolerated all the procedures very well  and was taken to the recovery room in excellent condition.                                                Yaakov Guthrie. Shon Hough, M.D.    Cathie Hoops  D:  01/03/2004  T:  01/03/2004  Job:  [id number]

## 2010-09-08 NOTE — Discharge Summary (Signed)
NAMESHAMBHAVI, [name]                ACCOUNT NO.:  [id number]   MEDICAL RECORD NO.:  [id number]          PATIENT TYPE:  OIB   LOCATION:  9108                          [facility]ACILITY[facility]   PHYSICIAN:  [name], M.D.DATE OF BIRTH:  25-May-1965   DATE OF ADMISSION:  08/09/2005  DATE OF DISCHARGE:  08/11/2005                                 DISCHARGE SUMMARY   DISCHARGE DIAGNOSIS:  1.  Menorrhagia.  2.  Fibroid uterus.  3.  Anemia (hemoglobin 11.7).  4.  Metrorrhagia.  5.  Postoperative nausea and vomiting.  6.  Asthma.   PROCEDURE:  August 09, 2005 vaginal hysterectomy.   HISTORY OF PRESENT ILLNESS:  [name] is a 45 year old female who presents  with the above-mentioned diagnosis.  She did not respond to hormonal therapy  nor endometrial ablation.  Please see her dictated history and physical  examination for details.   ADMISSION PHYSICAL EXAMINATION:  The patient was noted to have a uterus that  was upper limits normal size.  Fibroids were known.   Kerr COURSE:  On the day of admission the patient was taken to the  operating room where she had a vaginal hysterectomy.  Operative findings  included a 230 g uterus with a large pedunculated fibroid on the fundus.  No  adnexal masses were noted.  The patient tolerated her operative procedure  well.  Her postoperative hemoglobin was 12 (preoperative hemoglobin was  11.7).  The patient remained afebrile throughout her postoperative stay.  The patient did experience significant nausea and vomiting postoperatively.  The patient is allergic to Phenergan and therefore she was given Zofran.  She began to feel better on postoperative day #1 and by postoperative day #2  she was tolerating a regular diet.  By postoperative day #2 she was able to  take pain medicine by mouth and we felt that she was ready for discharge.   DISCHARGE INSTRUCTIONS:  The patient was given a prescription for Vicodin  and she will take 0.2 tablets every four  hours as needed for pain.  She will  also use ibuprofen 800 mg every eight hours as needed for pain.  She was  given Zofran 8 mg and she will take one tablet every eight hours as needed  for nausea.  She will take iron 325 mg twice each day for six weeks.  The  patient will return to see Dr. Stefano Kerr in six weeks.  She will refrain from  driving for two weeks, heavy lifting for four weeks, and intercourse for six  weeks.  She will call for questions or concerns.  She was given a copy of  the postoperative instruction sheet as prepared by [name]  for patients who have had major gynecologic surgery.      [name], M.D.  Electronically Signed     AVS/MEDQ  D:  08/11/2005  T:  08/13/2005  Job:  [id number]   cc:   [name], M.D. [name]  [address] [place], [place] [address]

## 2010-09-08 NOTE — Assessment & Plan Note (Signed)
[facility]                             PULMONARY OFFICE NOTE   [name], [name]                         MRN:          [id number]  DATE:05/02/2006                            DOB:          [birth year]    HISTORY OF PRESENT ILLNESS:  The patient is a 45 year old African-  American female patient of Dr. Jayme Cloud who has a known history of  asthma complicated by underlying vocal cord dysfunction and  gastroesophageal reflux who returns today for a 2-week followup.  The  patient has had difficulty with recent asthmatic bronchitic frequent  exacerbations over the last 2 months requiring 3 different courses of  antibiotics and prednisone bursts.  The patient underwent a CT of the  sinuses on April 24, 2006, which was unremarkable.  The patient has now  finished a 10-day course of Augmentin two weeks ago and reports that she  is improved with decreased cough, congestion, and wheezing.  The patient  is also on an aggressive reflux prevention with Protonix twice daily and  has been changed over to Symbicort 2 puffs twice daily.   PAST MEDICAL HISTORY:  Reviewed.   CURRENT MEDICATIONS:  Reviewed.   PHYSICAL EXAMINATION:  GENERAL:  The patient is a pleasant female in no  acute distress.  VITAL SIGNS: Afebrile, stable vital signs, O2 saturation 100% on room  air.  HEENT: Nasal mucosa is pale.  Nontender sinuses.  Posterior pharynx  clear.  NECK: Supple without adenopathy.  LUNGS:  Coarse breath sounds without wheezing or crackles.  CARDIAC: Regular rate and rhythm.  ABDOMEN: Soft, nontender.  EXTREMITIES:  Warm without edema.   IMPRESSION AND PLAN:  1. Frequent asthmatic bronchitic exacerbations complicated by      underlying vocal cord dysfunction and reflux.  She is improved      after antibiotics and aggressive reflux and cough prevention. The      patient is to continue on her continue regimen and will follow back      up with Dr. Jayme Cloud in 2-3  weeks or sooner if needed.  2. Complex medication regimen.  The patient's medications reviewed.  A      computerized medication calendar was completed for this patient and      reviewed in detail.     Rubye Oaks, NP  Electronically Signed      Gina Shelter, MD  Electronically Signed   TP/MedQ  DD: 05/06/2006  DT: 05/06/2006  Job #: [id number]

## 2010-09-08 NOTE — Assessment & Plan Note (Signed)
[facility]                             PULMONARY OFFICE NOTE   [name], [name]                         MRN:          [id number]  DATE:03/21/2006                            DOB:          01-Dec-1965    HISTORY OF PRESENT ILLNESS:  The patient is a 45 year old African  American female patient of Dr. Jayme Cloud who has a known history of  asthmatic bronchitis who presents for followup.  The patient was seen  approximately 2 weeks ago for asthmatic bronchitic exacerbation and  given a Z-Pak and a prednisone taper.  The patient reports she had  minimum improvement in symptoms initially; however, symptoms have  returned and have worsened, now with productive cough with thick green  sputum and intermittent wheezing.  The patient did undergo a chest x-ray  last visit which was reported as negative.  The patient denies any  hemoptysis, chest pain, orthopnea, PND, or leg swelling.   PAST MEDICAL HISTORY:  Reviewed.   CURRENT MEDICATIONS:  Reviewed.   PHYSICAL EXAMINATION:  GENERAL:  The patient is a pleasant obese black  female in no acute distress.  VITAL SIGNS:  She is afebrile with stable vital signs.  Her O2  saturation is 98% on room air.  HEENT:  Unremarkable.  NECK:  Supple with no adenopathy.  LUNGS:  Lung sounds reveal coarse breath sounds with a few expiratory  wheezes.  CARDIAC:  Regular rate and rhythm.  ABDOMEN:  Soft.  EXTREMITIES:  Warm without edema.   IMPRESSION AND PLAN:  Acute exacerbation of asthmatic bronchitis, slow  to resolve.  The patient is to begin Levaquin 750 mg daily x5 day,  Mucinex DM twice a day, Endal HD, 8 oz., 1 to 2 teaspoons every 4 to 6  hours as needed for cough.  The patient is to continue on Protonix twice  daily, and we will add in Xyzal every night x5 days.  Samples were  given.  The patient is to return back with Dr. Jayme Cloud as scheduled or  sooner if needed.      Rubye Oaks, NP  Electronically  Signed      Gailen Shelter, MD  Electronically Signed   TP/MedQ  DD: 03/21/2006  DT: 03/22/2006  Job #: [id number]

## 2010-11-16 ENCOUNTER — Telehealth: Payer: Self-pay | Admitting: Internal Medicine

## 2010-11-16 ENCOUNTER — Ambulatory Visit (INDEPENDENT_AMBULATORY_CARE_PROVIDER_SITE_OTHER): Payer: Commercial Managed Care - PPO | Admitting: Adult Health

## 2010-11-16 ENCOUNTER — Encounter: Payer: Self-pay | Admitting: Adult Health

## 2010-11-16 DIAGNOSIS — J209 Acute bronchitis, unspecified: Secondary | ICD-10-CM

## 2010-11-16 MED ORDER — AZITHROMYCIN 250 MG PO TABS: ORAL_TABLET | ORAL | Status: AC

## 2010-11-16 NOTE — Assessment & Plan Note (Signed)
Flare w/ associated rhinitis   Plan;  Zpack take as directed.  Mucinex DM Twice daily  As needed   Saline nasal rinses As needed   Please contact office for sooner follow up if symptoms do not improve or worsen or seek emergency care

## 2010-11-16 NOTE — Patient Instructions (Signed)
Zpack take as directed.  Mucinex DM Twice daily  As needed   Saline nasal rinses As needed   Please contact office for sooner follow up if symptoms do not improve or worsen or seek emergency care

## 2010-11-16 NOTE — Telephone Encounter (Signed)
Pt has appt for today with TP.

## 2010-11-16 NOTE — Progress Notes (Signed)
  Subjective:    Patient ID: [name], female    DOB: 1966/02/05, 45 y.o.   MRN: [id number]  HPI 45 yo AAF with known hx of asthma and allergic rhinitis  11/16/2010 Acute oV  Pt complains of sinus pressure, left ear discomfort, increased SOB, wheezing, PND, dry cough, tightness in chest, fever x1day.  Has had on/off symptoms for 2 weeks , worse today . 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    Review of Systems Constitutional:   No  weight loss, night sweats,  Fevers, chills, fatigue, or  lassitude.  HEENT:   No headaches,  Difficulty swallowing,  Tooth/dental problems, or  Sore throat,             + sneezing, itching, ear ache, nasal congestion, post nasal drip,   CV:  No chest pain,  Orthopnea, PND, swelling in lower extremities, anasarca, dizziness, palpitations, syncope.   GI  No heartburn, indigestion, abdominal pain, nausea, vomiting, diarrhea, change in bowel habits, loss of appetite, bloody stools.    Pulm:  No chest wall deformity  Skin: no rash or lesions.  GU: no dysuria, change in color of urine, no urgency or frequency.  No flank pain, no hematuria   MS:  No joint pain or swelling.  No decreased range of motion.  No back pain.  Psych:  No change in mood or affect. No depression or anxiety.  No memory loss.         Objective:   Physical Exam GEN: A/Ox3; pleasant , NAD, well nourished   HEENT:  /AT,  EACs-clear, TMs-wnl, NOSE-clear, THROAT-clear drainage , no lesions, no postnasal drip or exudate noted.   NECK:  Supple w/ fair ROM; no JVD; normal carotid impulses w/o bruits; no thyromegaly or nodules palpated; no lymphadenopathy.  RESP  Clear  P & A; w/o, wheezes/ rales/ or rhonchi.no accessory muscle use, no dullness to percussion  CARD:  RRR, no m/r/g  , no peripheral edema, pulses intact, no cyanosis or clubbing.  GI:   Soft & nt; nml bowel sounds; no organomegaly or masses detected.  Musco: Warm bil, no deformities or joint  swelling noted.   Neuro: alert, no focal deficits noted.    Skin: Warm, no lesions or rashes          Assessment & Plan:

## 2011-01-11 LAB — BASIC METABOLIC PANEL
BUN: 10
BUN: 7
CO2: 26
CO2: 27
Calcium: 9.2
Calcium: 9.4
Chloride: 107
Chloride: 108
Creatinine, Ser: 0.79
Creatinine, Ser: 0.85
GFR calc Af Amer: >60
GFR calc Af Amer: >60
GFR calc non Af Amer: >60
GFR calc non Af Amer: >60
Glucose, Bld: 114 — ABNORMAL HIGH
Glucose, Bld: 140 — ABNORMAL HIGH
Potassium: 3.7
Potassium: 4.4
Sodium: 139
Sodium: 139

## 2011-01-11 LAB — CBC
HCT: 33.6 — ABNORMAL LOW
HCT: 35.4 — ABNORMAL LOW
Hemoglobin: 11.5 — ABNORMAL LOW
Hemoglobin: 11.8 — ABNORMAL LOW
MCHC: 33.3
MCHC: 34.2
MCV: 81.9
MCV: 82.1
Platelets: 294
Platelets: 313
RBC: 4.1
RBC: 4.32
RDW: 14.2
RDW: 14.2
WBC: 15.8 — ABNORMAL HIGH
WBC: 9.3

## 2011-01-11 LAB — DIFFERENTIAL
Basophils Absolute: 0
Basophils Relative: 1
Eosinophils Absolute: 0.3
Eosinophils Relative: 3
Lymphocytes Relative: 27
Lymphs Abs: 2.5
Monocytes Absolute: 0.7
Monocytes Relative: 8
Neutro Abs: 5.7
Neutrophils Relative %: 62

## 2011-01-11 LAB — BLOOD GAS, ARTERIAL
Acid-Base Excess: 1.2
Bicarbonate: 25.7 — ABNORMAL HIGH
Drawn by: 229971
O2 Content: 21
O2 Saturation: 95.1
Patient temperature: 98.6
TCO2: 23.5
pCO2 arterial: 43
pH, Arterial: 7.394
pO2, Arterial: 75.2 — ABNORMAL LOW

## 2011-03-02 ENCOUNTER — Ambulatory Visit (INDEPENDENT_AMBULATORY_CARE_PROVIDER_SITE_OTHER): Payer: 59 | Admitting: Internal Medicine

## 2011-03-02 ENCOUNTER — Telehealth: Payer: Self-pay | Admitting: Internal Medicine

## 2011-03-02 VITALS — BP 148/100 | HR 94 | Temp 98.5°F

## 2011-03-02 DIAGNOSIS — J45901 Unspecified asthma with (acute) exacerbation: Secondary | ICD-10-CM

## 2011-03-02 MED ORDER — SODIUM CHLORIDE 0.9 % IV SOLN
125.0000 mg | Freq: Once | INTRAVENOUS | Status: AC
  Administered 2011-03-02: 130 mg via INTRAVENOUS

## 2011-03-02 MED ORDER — METHYLPREDNISOLONE SODIUM SUCC 125 MG IJ SOLR: 125.0000 mg | Freq: Once | INTRAMUSCULAR | Status: DC

## 2011-03-02 MED ORDER — PREDNISONE (PAK) 10 MG PO TABS: 10.0000 mg | ORAL_TABLET | Freq: Every day | ORAL | Status: AC

## 2011-03-02 MED ORDER — HYDROCOD POLST-CHLORPHEN POLST 10-8 MG/5ML PO LQCR: 5.0000 mL | Freq: Two times a day (BID) | ORAL | Status: DC

## 2011-03-02 NOTE — Telephone Encounter (Signed)
Called and spoke with pt and she is aware that per CY--ok for pt to have pred dose pak and pt requested that this be sent to [facility] pharmacy.  Pt also requested that rx for tussionex be printed out for her to come by and pick this up.  Per CY--ok for tussionex and this has been printed out and placed up front for the pt to pick up and pt is aware.  Pt stated that she will take the pred taper but this will not help her and she knows that she is getting into trouble.  i advised pt that if she feels that she gets worse then she will need to go to the ER for eval and pt stated that she is not going to sit in the ER with all those sick people and get sick herself.  i advised that this is what CY recs are for her to try. Pt stated that she will come by for the rx of the tussionex.

## 2011-03-02 NOTE — Telephone Encounter (Signed)
Per CY-okay to give Prednisone taper as follows: Prednisone 10 mg #20 take 4 x 2 days, 3 x 2 days, 2 x 2 days, 1 x 2 days then stop. No refills

## 2011-03-02 NOTE — Telephone Encounter (Signed)
Called and spoke with pt and she stated that she needs a steroid shot today---i explained to her that we have to get her an appt with CY---she stated that she has been ok until today.  She stated that she is trying to avoid being admitted to the hospital.  Pt stated that her peak flow in the 200's and usually in the 400's.  CY please advise. thanks

## 2011-03-03 ENCOUNTER — Ambulatory Visit: Payer: Commercial Managed Care - PPO | Admitting: Internal Medicine

## 2011-03-05 NOTE — Progress Notes (Signed)
  Subjective:    Patient ID: [name], female    DOB: May 27, 1965, 45 y.o.   MRN: [id number]  HPI [name] is seen as an acute work-in due to asthma flare. She reports that she has been having trouble over the past 24 hr with increased wheezing and tightness. She has been using her home nebulizer more than 4 times a day. Because of continued respiratory distress she presents for assessment and treatment. She is not fatigued, without tachypnea and is able to speak whole sentences. She reports that she did a nebulizer treatment within the hour of being seen.  I have reviewed the patient's medical history in detail and updated the computerized patient record.    Review of Systems System review is negative for any constitutional, cardiac, pulmonary, GI or neuro symptoms or complaints other than as described in the HPI.     Objective:   Physical Exam Vitals - BP elevated. O2 sat surprisingly good at 98% Gen'l - obese AA woman who is uncomfortable but not in acute distress Cor - regular tachycardia Pulm - increassed WOB that is mild. Auscultation reveals good BS with equal air movement, no diffuse wheezing.       Assessment & Plan:

## 2011-03-05 NOTE — Assessment & Plan Note (Signed)
Patient with an acute exacerbation of her asthma. It is difficult to assess the degree of difficulty since she has just had a nebulizer treatment with albuterol. Oxygen sat is good, pt's color is good and she is not acute breathless.  Plan - solumedrol 125 mg IM           Prednisone burst and taper.           OK to use nebulizer treatment. SHE IS CAREFULLY INSTRUCTED TO SEEK ACUTE MEDICAL ASSISTANCE IF SHE NEEDS TO USE NEBULIZER MORE FREQUENTLY THAN Q4

## 2011-03-22 ENCOUNTER — Encounter: Payer: Self-pay | Admitting: Internal Medicine

## 2011-03-22 ENCOUNTER — Ambulatory Visit (INDEPENDENT_AMBULATORY_CARE_PROVIDER_SITE_OTHER): Payer: 59 | Admitting: Internal Medicine

## 2011-03-22 VITALS — BP 124/64 | HR 105 | Ht 63.0 in | Wt 266.0 lb

## 2011-03-22 DIAGNOSIS — J45901 Unspecified asthma with (acute) exacerbation: Secondary | ICD-10-CM

## 2011-03-22 MED ORDER — FLUTICASONE FUROATE 27.5 MCG/SPRAY NA SUSP: 2.0000 | Freq: Every day | NASAL | Status: DC

## 2011-03-22 MED ORDER — PREDNISONE 10 MG PO TABS: ORAL_TABLET | ORAL | Status: DC

## 2011-03-22 MED ORDER — LEVALBUTEROL HCL 1.25 MG/3ML IN NEBU: 1.2500 mg | INHALATION_SOLUTION | Freq: Four times a day (QID) | RESPIRATORY_TRACT | Status: DC | PRN

## 2011-03-22 MED ORDER — PANTOPRAZOLE SODIUM 40 MG PO TBEC: 40.0000 mg | DELAYED_RELEASE_TABLET | Freq: Two times a day (BID) | ORAL | Status: DC

## 2011-03-22 MED ORDER — EPINEPHRINE 0.3 MG/0.3ML IJ DEVI: INTRAMUSCULAR | Status: DC

## 2011-03-22 MED ORDER — LEVALBUTEROL TARTRATE 45 MCG/ACT IN AERO: 2.0000 | INHALATION_SPRAY | Freq: Four times a day (QID) | RESPIRATORY_TRACT | Status: DC | PRN

## 2011-03-22 MED ORDER — FUROSEMIDE 40 MG PO TABS: 40.0000 mg | ORAL_TABLET | Freq: Every day | ORAL | Status: DC | PRN

## 2011-03-22 MED ORDER — ARFORMOTEROL TARTRATE 15 MCG/2ML IN NEBU
15.0000 ug | INHALATION_SOLUTION | Freq: Once | RESPIRATORY_TRACT | Status: AC
  Administered 2011-03-22: 15 ug via RESPIRATORY_TRACT

## 2011-03-22 MED ORDER — ARFORMOTEROL TARTRATE 15 MCG/2ML IN NEBU: 15.0000 ug | INHALATION_SOLUTION | Freq: Two times a day (BID) | RESPIRATORY_TRACT | Status: DC

## 2011-03-22 MED ORDER — PROMETHAZINE-CODEINE 6.25-10 MG/5ML PO SYRP: 5.0000 mL | ORAL_SOLUTION | ORAL | Status: AC | PRN

## 2011-03-22 MED ORDER — METHYLPREDNISOLONE ACETATE 80 MG/ML IJ SUSP
80.0000 mg | Freq: Once | INTRAMUSCULAR | Status: AC
  Administered 2011-03-22: 80 mg via INTRAMUSCULAR

## 2011-03-22 MED ORDER — BUDESONIDE-FORMOTEROL FUMARATE 160-4.5 MCG/ACT IN AERO: 2.0000 | INHALATION_SPRAY | Freq: Two times a day (BID) | RESPIRATORY_TRACT | Status: DC

## 2011-03-22 NOTE — Progress Notes (Signed)
Patient ID: [name], female    DOB: 1965/11/13, 45 y.o.   MRN: [id number]  HPI 45 yo AAF with known hx of asthma and allergic rhinitis, GERD  11/16/2010 Acute oV  Pt complains of sinus pressure, left ear discomfort, increased SOB, wheezing, PND, dry cough, tightness in chest, fever x1day.  Has had on/off symptoms for 2 weeks , worse today . 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 She gives history that repeated exposures to flu vaccine were associated with significant systemic febrile illness, cough and myalgias. Exposed to strong perfume in early November and has been coughing since.Dr Debby Bud gave her Depo-Medrol. She has not been able to stopdry coughing. Could not afford Zyflo, offered as an alternative to Singulair. We discussed her history of GERD and esophageal dilatation as it relates to her pattern of cyclical cough.  Review of Systems Constitutional:   No-   weight loss, night sweats, fevers, chills, fatigue, lassitude. HEENT:   + headaches, No-difficulty swallowing, tooth/dental problems, sore throat,       Some  sneezing, itching, ear ache, nasal congestion, post nasal drip,  CV:  No-   chest pain, orthopnea, PND, swelling in lower extremities, anasarca,                                  dizziness, palpitations Resp: No- acute  shortness of breath with exertion or at rest.              No-   productive cough,  + non-productive cough,  No- coughing up of blood.              No-   change in color of mucus.  Some wheezing.   Skin: No-   rash or lesions. GI:  No-   heartburn, indigestion, abdominal pain, nausea, vomiting, diarrhea,                 change in bowel habits, loss of appetite GU: No-   dysuria, change in color of urine, no urgency or frequency.  No- flank pain. MS:  No-   joint pain or swelling.  No- decreased range of motion.  No- back  pain. Neuro-     nothing unusual Psych:  No- change in mood or affect. No depression or anxiety.  No memory loss.     Objective:   Physical Exam General- Alert, Oriented, Affect-appropriate, Distress- none acute; obese  Skin- rash-none, lesions- none, excoriation- none Lymphadenopathy- none Head- atraumatic            Eyes- Gross vision intact, PERRLA, conjunctivae clear secretions            Ears- Hearing, canals-normal            Nose- Clear, no-Septal dev, mucus, polyps, erosion, perforation             Throat- Mallampati II , mucosa clear , drainage- none, tonsils- atrophic Neck- flexible , trachea midline, no stridor , thyroid nl, carotid no bruit Chest - symmetrical excursion , unlabored           Heart/CV- RRR , no murmur , no gallop  , no rub, nl s1 s2                           -  JVD- none , edema- none, stasis changes- none, varices- none           Lung- minimal wheeze but incessant cough , dullness-none, rub- none           Chest wall-  Abd- tender-no, distended-no, bowel sounds-present, HSM- no Br/ Gen/ Rectal- Not done, not indicated Extrem- cyanosis- none, clubbing, none, atrophy- none, strength- nl Neuro- grossly intact to observation

## 2011-03-22 NOTE — Patient Instructions (Addendum)
Neb Brovana  Depo 80 ........................  Script Brovana neb solution- try using up to twice daily as an alternative to Xopenex neb solution  Script prednisone taper  Note- flu shot contraindicated  Script for cough syrup

## 2011-03-23 NOTE — Assessment & Plan Note (Addendum)
Asthma with exacerbation fitting the pattern of cyclical cough. If we can break the cough cycle and reduce tendency to reflux I hope for better control. Plan-Brovana nebulizer treatment, depomedrol, cough syrup, prednisone taper.  She asks for renewal of previous recommendation that she not have the TIA influenza vaccine because of history of systemic reactions.

## 2011-05-03 ENCOUNTER — Ambulatory Visit (INDEPENDENT_AMBULATORY_CARE_PROVIDER_SITE_OTHER): Payer: 59 | Admitting: Internal Medicine

## 2011-05-03 ENCOUNTER — Encounter: Payer: Self-pay | Admitting: Internal Medicine

## 2011-05-03 VITALS — BP 122/72 | HR 104 | Ht 63.0 in | Wt 266.0 lb

## 2011-05-03 DIAGNOSIS — J45909 Unspecified asthma, uncomplicated: Secondary | ICD-10-CM

## 2011-05-03 DIAGNOSIS — K219 Gastro-esophageal reflux disease without esophagitis: Secondary | ICD-10-CM

## 2011-05-03 NOTE — Patient Instructions (Signed)
I agree that reflux is an important part of your asthma.  Order- [facility] - return appointment  with GI/ Dr Christella Hartigan for problem of GERD aggravating asthma

## 2011-05-03 NOTE — Progress Notes (Signed)
Patient ID: [name], female    DOB: 1966/04/01, 46 y.o.   MRN: [id number]  HPI 46 yo AAF with known hx of asthma and allergic rhinitis, GERD  11/16/2010 Acute oV  Pt complains of sinus pressure, left ear discomfort, increased SOB, wheezing, PND, dry cough, tightness in chest, fever x1day.  Has had on/off symptoms for 2 weeks , worse today . 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 She gives history that repeated exposures to flu vaccine were associated with significant systemic febrile illness, cough and myalgias. Exposed to strong perfume in early November and has been coughing since.Dr Debby Bud gave her Depo-Medrol. She has not been able to stop dry coughing. Could not afford Zyflo, offered as an alternative to Singulair. We discussed her history of GERD and esophageal dilatation as it relates to her pattern of cyclical cough.  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 She has declined flu vaccine reporting significant acute illness on the few times she had taken it in the past. We discussed this. After last here she finally improved by the end of November. Hydromet cough syrup has worked well for her. She has learned to pay more attention and realizes the importance of reflux and aspiration related to her asthma symptoms. She thinks she may need a repeat esophageal dilatation for which she has seen Dr. Christella Hartigan in the past.   Review of Systems Constitutional:   No-   weight loss, night sweats, fevers, chills, fatigue, lassitude. HEENT:   + headaches, No-difficulty swallowing, tooth/dental problems, sore throat,       Some  sneezing, itching, ear ache, nasal congestion, post nasal drip,  CV:  No-   chest pain, orthopnea, PND, swelling in lower extremities, anasarca, dizziness,  palpitations Resp: No recent acute  shortness of breath with exertion or at rest.              No-   productive cough,  + non-productive cough,  No- coughing up of blood.              No-   change in color of mucus.  Some wheezing.   Skin: No-   rash or lesions. GI:  No-   heartburn, indigestion, abdominal pain, nausea, vomiting, diarrhea,                 change in bowel habits, loss of appetite GU: MS:  No-   joint pain or swelling.  No- decreased range of motion.  No- back pain. Neuro-     nothing unusual Psych:  No- change in mood or affect. No depression or anxiety.  No memory loss.     Objective:   Physical Exam General- Alert, Oriented, Affect-appropriate, Distress- none acute; obese  Skin- rash-none, lesions- none, excoriation- none Lymphadenopathy- none Head- atraumatic            Eyes- Gross vision intact, PERRLA, conjunctivae clear secretions            Ears- Hearing, canals-normal            Nose- Clear, no-Septal dev, mucus, polyps, erosion, perforation             Throat- Mallampati II , mucosa clear , drainage- none, tonsils- atrophic Neck- flexible , trachea midline, no stridor , thyroid nl, carotid no bruit Chest -  symmetrical excursion , unlabored           Heart/CV- RRR , no murmur , no gallop  , no rub, nl s1 s2                           - JVD- none , edema- none, stasis changes- none, varices- none           Lung- no wheeze or cough today , dullness-none, rub- none           Chest wall-  Abd-  Br/ Gen/ Rectal- Not done, not indicated Extrem- cyanosis- none, clubbing, none, atrophy- none, strength- nl Neuro- grossly intact to observation

## 2011-05-05 NOTE — Assessment & Plan Note (Signed)
Reflux has been a significant trigger for her. Fortunately she is now under good control, but she needs to followup with Dr. Christella Hartigan for attention to her reflux problems.

## 2011-05-18 ENCOUNTER — Ambulatory Visit (INDEPENDENT_AMBULATORY_CARE_PROVIDER_SITE_OTHER): Payer: 59 | Admitting: Gastroenterology

## 2011-05-18 ENCOUNTER — Encounter: Payer: Self-pay | Admitting: Gastroenterology

## 2011-05-18 DIAGNOSIS — K219 Gastro-esophageal reflux disease without esophagitis: Secondary | ICD-10-CM

## 2011-05-18 DIAGNOSIS — R1310 Dysphagia, unspecified: Secondary | ICD-10-CM
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
## 2011-05-18 NOTE — Patient Instructions (Signed)
You will be set up for an upper endoscopy with dilation balloon. Try cutting back on the protonix to one pill once a day.

## 2011-05-29 ENCOUNTER — Telehealth: Payer: Self-pay | Admitting: Gastroenterology

## 2011-05-29 NOTE — Telephone Encounter (Signed)
No, not since she already rescheduled it.

## 2011-05-30 ENCOUNTER — Other Ambulatory Visit: Payer: 59 | Admitting: Gastroenterology

## 2011-06-11 ENCOUNTER — Ambulatory Visit (AMBULATORY_SURGERY_CENTER): Payer: 59 | Admitting: Gastroenterology

## 2011-06-11 ENCOUNTER — Encounter: Payer: Self-pay | Admitting: Gastroenterology

## 2011-06-11 DIAGNOSIS — R1310 Dysphagia, unspecified: Secondary | ICD-10-CM

## 2011-06-11 DIAGNOSIS — R933 Abnormal findings on diagnostic imaging of other parts of digestive tract: Secondary | ICD-10-CM

## 2011-06-11 DIAGNOSIS — K219 Gastro-esophageal reflux disease without esophagitis: Secondary | ICD-10-CM

## 2011-06-11 MED ORDER — SODIUM CHLORIDE 0.9 % IV SOLN: 500.0000 mL | INTRAVENOUS | Status: DC

## 2011-06-11 NOTE — Op Note (Signed)
[facility] [address] [place], [place]  [address]  ENDOSCOPY PROCEDURE REPORT  PATIENT:  Gina, Kerr  MR#:  [id number] BIRTHDATE:  [birth year], 46 yrs. old  GENDER:  female ENDOSCOPIST:  Rachael Fee, MD PROCEDURE DATE:  06/11/2011 PROCEDURE:  EGD with balloon dilatation ASA CLASS:  Class II INDICATIONS:  smooth, distal esophageal narrowing. EGD August 2009, dilated to 19 mm. This resulted in significant but incomplete improvement in symptoms. Repeat EGD October 2009 dilated the smooth narrowing up to 20 mm; dysphagia again now MEDICATIONS:   Fentanyl 50 mcg IV, These medications were titrated to patient response per physician's verbal order, Versed 4 mg IV TOPICAL ANESTHETIC:  Cetacaine Spray  DESCRIPTION OF PROCEDURE:   After the risks benefits and alternatives of the procedure were thoroughly explained, informed consent was obtained.  The LB [id number]-[id number] endoscope was introduced through the mouth and advanced to the second portion of the duodenum, without limitations.  The instrument was slowly withdrawn as the mucosa was fully examined. <<PROCEDUREIMAGES>> There was minor stenosis at GE junction without clear ring or other mucosal abnormality. This was dilated up to 20mm with CRE TTS balloon held inflated for 1 minute with minor mucosal oozing, self limited (see image4 and image7).  Otherwise the examination was normal (see image1, image2, and image3).    Retroflexed views revealed no abnormalities.    The scope was then withdrawn from the patient and the procedure completed. COMPLICATIONS:  None  ENDOSCOPIC IMPRESSION: 1) Minor, smooth narrowing at GE juntion; dilated to 20mm with CRE balloon 2) Otherwise normal examination  RECOMMENDATIONS: Can repeat EGD with dilation if needed  ______________________________ Rachael Fee, MD  cc: Wyonia Hough, MD  n. Rosalie Doctor:   Rachael Fee at 06/11/2011 11:42 AM  Wadie Lessen, [phone]

## 2011-06-11 NOTE — Progress Notes (Signed)
Patient did not experience any of the following events: a burn prior to discharge; a fall within the facility; wrong site/side/patient/procedure/implant event; or a hospital transfer or hospital admission upon discharge from the facility. (G8907) Patient did not have preoperative order for IV antibiotic SSI prophylaxis. (G8918)  

## 2011-06-11 NOTE — Patient Instructions (Signed)
YOU HAD AN ENDOSCOPIC PROCEDURE TODAY AT THE [facility]: Refer to the procedure report that was given to you for any specific questions about what was found during the examination.  If the procedure report does not answer your questions, please call your gastroenterologist to clarify.  If you requested that your care partner not be given the details of your procedure findings, then the procedure report has been included in a sealed envelope for you to review at your convenience later.  YOU SHOULD EXPECT: Some feelings of bloating in the abdomen. Passage of more gas than usual.  Walking can help get rid of the air that was put into your GI tract during the procedure and reduce the bloating. If you had a lower endoscopy (such as a colonoscopy or flexible sigmoidoscopy) you may notice spotting of blood in your stool or on the toilet paper. If you underwent a bowel prep for your procedure, then you may not have a normal bowel movement for a few days.  DIET:  SEE POST ESOPHAGEAL DILATION DIET   Your first meal following the procedure should be a light meal and then it is ok to progress to your normal diet.  A half-sandwich or bowl of soup is an example of a good first meal.  Heavy or fried foods are harder to digest and may make you feel nauseous or bloated.  Likewise meals heavy in dairy and vegetables can cause extra gas to form and this can also increase the bloating.  Drink plenty of fluids but you should avoid alcoholic beverages for 24 hours.  ACTIVITY: Your care partner should take you home directly after the procedure.  You should plan to take it easy, moving slowly for the rest of the day.  You can resume normal activity the day after the procedure however you should NOT DRIVE or use heavy machinery for 24 hours (because of the sedation medicines used during the test).    SYMPTOMS TO REPORT IMMEDIATELY: A gastroenterologist can be reached at any hour.  During normal business hours, 8:30 AM  to 5:00 PM Monday through Friday, call [phone].  After hours and on weekends, please call the GI answering service at [phone] who will take a message and have the physician on call contact you.   Following upper endoscopy (EGD)  Vomiting of blood or coffee ground material  New chest pain or pain under the shoulder blades  Painful or persistently difficult swallowing  New shortness of breath  Fever of 100F or higher  Black, tarry-looking stools  FOLLOW UP: If any biopsies were taken you will be contacted by phone or by letter within the next 1-3 weeks.  Call your gastroenterologist if you have not heard about the biopsies in 3 weeks.  Our staff will call the home number listed on your records the next business day following your procedure to check on you and address any questions or concerns that you may have at that time regarding the information given to you following your procedure. This is a courtesy call and so if there is no answer at the home number and we have not heard from you through the emergency physician on call, we will assume that you have returned to your regular daily activities without incident.  SIGNATURES/CONFIDENTIALITY: You and/or your care partner have signed paperwork which will be entered into your electronic medical record.  These signatures attest to the fact that that the information above on your After Visit Summary has  been reviewed and is understood.  Full responsibility of the confidentiality of this discharge information lies with you and/or your care-partner.  

## 2011-06-11 NOTE — Progress Notes (Signed)
Pt was adamant about not using the elevator and refused to sit in a wheelchair. Pt was escorted down four flights of stairs with two nurses, Darlyn Read RN and Kirstie Mirza RN. No harm done while patient being discharged.

## 2011-06-11 NOTE — Progress Notes (Signed)
The pt tolerated the egd with balloon dilatation very well. Maw

## 2011-06-12 ENCOUNTER — Telehealth: Payer: Self-pay | Admitting: *Deleted

## 2011-06-12 NOTE — Telephone Encounter (Signed)
No answer, message left

## 2011-07-13 ENCOUNTER — Encounter: Payer: Self-pay | Admitting: Internal Medicine

## 2011-07-13 ENCOUNTER — Ambulatory Visit (INDEPENDENT_AMBULATORY_CARE_PROVIDER_SITE_OTHER): Payer: 59 | Admitting: Internal Medicine

## 2011-07-13 DIAGNOSIS — J45901 Unspecified asthma with (acute) exacerbation: Secondary | ICD-10-CM

## 2011-07-13 DIAGNOSIS — J45909 Unspecified asthma, uncomplicated: Secondary | ICD-10-CM

## 2011-07-13 DIAGNOSIS — J309 Allergic rhinitis, unspecified: Secondary | ICD-10-CM

## 2011-07-13 MED ORDER — METHYLPREDNISOLONE ACETATE 80 MG/ML IJ SUSP
80.0000 mg | Freq: Once | INTRAMUSCULAR | Status: AC
  Administered 2011-07-13: 80 mg via INTRAMUSCULAR

## 2011-07-13 MED ORDER — HYDROCOD POLST-CHLORPHEN POLST 10-8 MG/5ML PO LQCR: 5.0000 mL | Freq: Two times a day (BID) | ORAL | Status: DC

## 2011-07-13 MED ORDER — LEVALBUTEROL HCL 0.63 MG/3ML IN NEBU
0.6300 mg | INHALATION_SOLUTION | Freq: Once | RESPIRATORY_TRACT | Status: AC
  Administered 2011-07-13: 0.63 mg via RESPIRATORY_TRACT

## 2011-07-13 MED ORDER — PREDNISONE 10 MG PO TABS: ORAL_TABLET | ORAL | Status: DC

## 2011-07-13 NOTE — Patient Instructions (Signed)
Home, sips of fluids, and follow anti-reflux guidelines  Script for prednisone sent, and for tussionex printed  Given Xopenex 0.63 x2, depo 80

## 2011-07-13 NOTE — Progress Notes (Signed)
Patient ID: [name], female    DOB: 1966-02-25, 46 y.o.   MRN: [id number]  HPI 46 yo AAF with known hx of asthma and allergic rhinitis, GERD  11/16/2010 Acute oV  Pt complains of sinus pressure, left ear discomfort, increased SOB, wheezing, PND, dry cough, tightness in chest, fever x1day.  Has had on/off symptoms for 2 weeks , worse today . 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 She gives history that repeated exposures to flu vaccine were associated with significant systemic febrile illness, cough and myalgias. Exposed to strong perfume in early November and has been coughing since.Dr Debby Bud gave her Depo-Medrol. She has not been able to stop dry coughing. Could not afford Zyflo, offered as an alternative to Singulair. We discussed her history of GERD and esophageal dilatation as it relates to her pattern of cyclical cough.  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 She has declined flu vaccine reporting significant acute illness on the few times she had taken it in the past. We discussed this. After last here she finally improved by the end of November. Hydromet cough syrup has worked well for her. She has learned to pay more attention and realizes the importance of reflux and aspiration related to her asthma symptoms. She thinks she may need a repeat esophageal dilatation for which she has seen Dr. Christella Hartigan in the past.  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 Had been improved after dilatation of esophageal stricture. Acute visit- Arrived acutely tight today-> given back to back neb Xop 0.63 x 2, Depo 80. She woke this morning with nasal congestion. Usual peak flow about 345 but today was 250. She tried to struggle  through a meeting but was sent here because of recognized wheezing and labored breathing. Admits minimal sinus pressure without fever sore throat or purulent discharge. Not aware of a reflux event although that is been a significant factor in the past. She is quite discouraged because she thought she had this under better control after the esophageal dilatation. She arrived here very tight with poor airflow and labored. She was given 2 sequential Xopenex 0.63 nebulizer treatments, then Depo-Medrol. She was significantly improved after these.  Review of Systems- see HPI Constitutional:   No-   weight loss, night sweats, fevers, chills, fatigue, lassitude. HEENT:   + headaches, No-difficulty swallowing, tooth/dental problems, sore throat,       Some  sneezing, itching, ear ache,  +nasal congestion, post nasal drip,  CV:  No-   chest pain, orthopnea, PND, swelling in lower extremities, anasarca, dizziness, palpitations Resp: No recent acute  shortness of breath with exertion or at rest.              No-   productive cough,  + non-productive cough,  No- coughing up of blood.              No-   change in color of mucus.  + wheezing.   Skin: No-   rash or lesions. GI:  No-   heartburn, indigestion, abdominal pain, nausea, vomiting, GU: MS:  No-   joint pain or swelling.  Neuro-     nothing unusual Psych:  No- change in mood or affect. + depression or anxiety.  No memory loss.  Objective:   Physical Exam General- Alert, Oriented, Affect-appropriate, Distress- none acute; obese  Skin- rash-none, lesions- none, excoriation- none Lymphadenopathy- none Head- atraumatic            Eyes- Gross vision intact, PERRLA, conjunctivae clear secretions            Ears- Hearing, canals-normal            Nose- Clear, no-Septal dev, mucus, polyps, erosion, perforation             Throat- Mallampati II , mucosa clear , drainage- none, tonsils- atrophic Neck- flexible , trachea midline, no stridor , thyroid  nl, carotid no bruit Chest - symmetrical excursion , unlabored           Heart/CV- RRR , no murmur , no gallop  , no rub, nl s1 s2                           - JVD- none , edema- none, stasis changes- none, varices- none           Lung- Poor airflow, tight, improved after neb here.            Chest wall-  Abd-  Br/ Gen/ Rectal- Not done, not indicated Extrem- cyanosis- none, clubbing, none, atrophy- none, strength- nl Neuro- grossly intact to observation

## 2011-07-16 NOTE — Assessment & Plan Note (Signed)
Onset this time with nasal congestion suggest possibility of a viral event although it has moved very fast. Previously, reflux or events have been her usual triggers. Medication compliance has been much better in recent years. Plan-after nebulizer x2, Depo-Medrol, she is given prescription for Tussionex and prednisone taper. Recommended ER if needed.

## 2011-07-16 NOTE — Assessment & Plan Note (Signed)
Rapid onset with nasal congestion earlier this morning on waking is nonspecific. Viral illness is favored.

## 2011-07-26 ENCOUNTER — Other Ambulatory Visit: Payer: Self-pay | Admitting: Internal Medicine

## 2011-07-30 ENCOUNTER — Telehealth: Payer: Self-pay | Admitting: *Deleted

## 2011-07-30 MED ORDER — DOXYCYCLINE HYCLATE 100 MG PO TABS: 100.0000 mg | ORAL_TABLET | Freq: Two times a day (BID) | ORAL | Status: AC

## 2011-07-30 NOTE — Telephone Encounter (Signed)
Pt states that she has spoken w/you about ABX & nasal decongestant: c/o spray not working, request new Rx to [facility].

## 2011-07-30 NOTE — Telephone Encounter (Signed)
Sent in Rx for doxy 100 mg bid x 10 and sudafed 30 mg bid or tid short term

## 2011-08-27 ENCOUNTER — Telehealth: Payer: Self-pay | Admitting: Internal Medicine

## 2011-08-27 NOTE — Telephone Encounter (Signed)
lmomtcb x1 for pt to get more information

## 2011-08-28 ENCOUNTER — Emergency Department (HOSPITAL_COMMUNITY): Payer: 59

## 2011-08-28 ENCOUNTER — Encounter (HOSPITAL_COMMUNITY): Payer: Self-pay | Admitting: *Deleted

## 2011-08-28 ENCOUNTER — Emergency Department (HOSPITAL_COMMUNITY)
Admission: EM | Admit: 2011-08-28 | Discharge: 2011-08-29 | Disposition: A | Discharge status: Home / Self Care | Payer: 59 | Attending: Emergency Medicine | Admitting: Emergency Medicine

## 2011-08-28 DIAGNOSIS — IMO0001 Reserved for inherently not codable concepts without codable children: Secondary | ICD-10-CM | POA: Insufficient documentation

## 2011-08-28 DIAGNOSIS — R112 Nausea with vomiting, unspecified: Secondary | ICD-10-CM

## 2011-08-28 DIAGNOSIS — R059 Cough, unspecified: Secondary | ICD-10-CM | POA: Insufficient documentation

## 2011-08-28 DIAGNOSIS — R5383 Other fatigue: Secondary | ICD-10-CM | POA: Insufficient documentation

## 2011-08-28 DIAGNOSIS — R05 Cough: Secondary | ICD-10-CM

## 2011-08-28 DIAGNOSIS — R0682 Tachypnea, not elsewhere classified: Secondary | ICD-10-CM | POA: Insufficient documentation

## 2011-08-28 DIAGNOSIS — R61 Generalized hyperhidrosis: Secondary | ICD-10-CM | POA: Insufficient documentation

## 2011-08-28 DIAGNOSIS — R0602 Shortness of breath: Secondary | ICD-10-CM | POA: Insufficient documentation

## 2011-08-28 DIAGNOSIS — R197 Diarrhea, unspecified: Secondary | ICD-10-CM | POA: Insufficient documentation

## 2011-08-28 DIAGNOSIS — R5381 Other malaise: Secondary | ICD-10-CM | POA: Insufficient documentation

## 2011-08-28 MED ORDER — ONDANSETRON 8 MG PO TBDP
8.0000 mg | ORAL_TABLET | Freq: Once | ORAL | Status: AC
  Administered 2011-08-28: 8 mg via ORAL
  Filled 2011-08-28: qty 1

## 2011-08-28 MED ORDER — IPRATROPIUM BROMIDE 0.02 % IN SOLN
0.5000 mg | Freq: Once | RESPIRATORY_TRACT | Status: AC
  Administered 2011-08-28: 0.5 mg via RESPIRATORY_TRACT
  Filled 2011-08-28 (×2): qty 2.5

## 2011-08-28 MED ORDER — PREDNISONE 10 MG PO TABS: ORAL_TABLET | ORAL | Status: DC

## 2011-08-28 MED ORDER — ACETAMINOPHEN 325 MG PO TABS
650.0000 mg | ORAL_TABLET | Freq: Once | ORAL | Status: DC
  Filled 2011-08-28: qty 2

## 2011-08-28 MED ORDER — ONDANSETRON HCL 4 MG/2ML IJ SOLN
4.0000 mg | Freq: Once | INTRAMUSCULAR | Status: AC
  Administered 2011-08-28: 4 mg via INTRAVENOUS
  Filled 2011-08-28: qty 2

## 2011-08-28 MED ORDER — SODIUM CHLORIDE 0.9 % IV BOLUS (SEPSIS)
1000.0000 mL | Freq: Once | INTRAVENOUS | Status: AC
  Administered 2011-08-28: 1000 mL via INTRAVENOUS

## 2011-08-28 MED ORDER — METOCLOPRAMIDE HCL 5 MG/ML IJ SOLN
10.0000 mg | Freq: Once | INTRAMUSCULAR | Status: AC
  Administered 2011-08-28: 10 mg via INTRAVENOUS
  Filled 2011-08-28: qty 2

## 2011-08-28 MED ORDER — ALBUTEROL SULFATE (5 MG/ML) 0.5% IN NEBU
5.0000 mg | INHALATION_SOLUTION | Freq: Once | RESPIRATORY_TRACT | Status: AC
  Administered 2011-08-28: 5 mg via RESPIRATORY_TRACT
  Filled 2011-08-28: qty 1
  Filled 2011-08-28: qty 0.5

## 2011-08-28 NOTE — ED Notes (Signed)
PA Dammen at bedside. 

## 2011-08-28 NOTE — Telephone Encounter (Signed)
I spoke with pt and is aware of CDY recs and directions. rx has been sent to the pharmacy.
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
## 2011-08-28 NOTE — Telephone Encounter (Signed)
Per CY-okay to rx Prednisone 10 mg #20 take 4 x 2 days, 3 x 2 days, 2 x 2 days, 1 x 2 days , then stop no refills.

## 2011-08-28 NOTE — ED Notes (Signed)
RT at bedside for breathing tx.

## 2011-08-28 NOTE — ED Notes (Signed)
Pt in c/o shortness of breath since this afternoon, states she thinks she aspirated when vomiting today, also had episodes of diarrhea

## 2011-08-28 NOTE — ED Notes (Signed)
Patient transported to X-ray 

## 2011-08-28 NOTE — ED Notes (Signed)
Pt states she is still nauseous. Pt request PR Tylenol. PA Dammen notified.

## 2011-08-28 NOTE — Telephone Encounter (Signed)
I spoke with pt and she c/o increase SOB, dry cough, chest tx and wheezing (using neb tx's and it helps with this) x yesterday. Denies any f/c/s/n/v. Pt has been using her phenergan w/ codeine cough syrup. She would like prednisone dose pack called in for her. Please advise Dr. Maple Hudson, thanks  Allergies  Allergen Reactions  . Influenza A (H1n1) Monoval Vac     Per pt report  . Omalizumab     Geoffry Paradise* REACTION: angioedema-looses airway  . Promethazine Hcl     REACTION: hallucinations     [facility] outpt pharmacy

## 2011-08-29 ENCOUNTER — Telehealth: Payer: Self-pay

## 2011-08-29 LAB — POCT I-STAT, CHEM 8
BUN: 14 mg/dL (ref 6–23)
Calcium, Ion: 1.21 mmol/L (ref 1.12–1.32)
Chloride: 103 mEq/L (ref 96–112)
Creatinine, Ser: 1.1 mg/dL (ref 0.50–1.10)
Glucose, Bld: 101 mg/dL — ABNORMAL HIGH (ref 70–99)
HCT: 42 % (ref 36.0–46.0)
Hemoglobin: 14.3 g/dL (ref 12.0–15.0)
Potassium: 3.7 mEq/L (ref 3.5–5.1)
Sodium: 139 mEq/L (ref 135–145)
TCO2: 25 mmol/L (ref 0–100)

## 2011-08-29 MED ORDER — KETOROLAC TROMETHAMINE 30 MG/ML IJ SOLN
30.0000 mg | Freq: Once | INTRAMUSCULAR | Status: AC
  Administered 2011-08-29: 30 mg via INTRAVENOUS
  Filled 2011-08-29: qty 1

## 2011-08-29 MED ORDER — DOXYCYCLINE HYCLATE 100 MG PO CAPS: 100.0000 mg | ORAL_CAPSULE | Freq: Two times a day (BID) | ORAL | Status: DC

## 2011-08-29 MED ORDER — ONDANSETRON 8 MG PO TBDP
ORAL_TABLET | ORAL | Status: AC
Start: 2011-08-29 — End: 2011-09-05

## 2011-08-29 MED ORDER — ONDANSETRON 8 MG PO TBDP: ORAL_TABLET | ORAL | Status: DC

## 2011-08-29 MED ORDER — ACETAMINOPHEN 650 MG RE SUPP: 650.0000 mg | Freq: Once | RECTAL | Status: DC

## 2011-08-29 NOTE — ED Notes (Signed)
PA Dammen at bedside. 

## 2011-08-29 NOTE — ED Provider Notes (Signed)
Medical screening examination/treatment/procedure(s) were performed by non-physician practitioner and as supervising physician I was immediately available for consultation/collaboration.   Boneta Standre M Trenee Igoe, MD 08/29/11 1306 

## 2011-08-29 NOTE — Discharge Instructions (Signed)
You were seen and evaluated for your symptoms of nausea, vomiting and diarrhea.  You were also evaluated for your symptoms of asthma and cough.  Your chest x-ray and lab tests have not shown any signs for a concerning or emergent cause to your symptoms.  At this time your provider(s) feel your symptoms are caused from a viral infection.  Continue to drink plenty of water to stay hydrated.  Follow up with your primary care provider.   Viral Syndrome You or your child has Viral Syndrome. It is the most common infection causing "colds" and infections in the nose, throat, sinuses, and breathing tubes. Sometimes the infection causes nausea, vomiting, or diarrhea. The germ that causes the infection is a virus. No antibiotic or other medicine will kill it. There are medicines that you can take to make you or your child more comfortable.  HOME CARE INSTRUCTIONS   Rest in bed until you start to feel better.   If you have diarrhea or vomiting, eat small amounts of crackers and toast. Soup is helpful.   Do not give aspirin or medicine that contains aspirin to children.   Only take over-the-counter or prescription medicines for pain, discomfort, or fever as directed by your caregiver.  SEEK IMMEDIATE MEDICAL CARE IF:   You or your child has not improved within one week.   You or your child has pain that is not at least partially relieved by over-the-counter medicine.   Thick, colored mucus or blood is coughed up.   Discharge from the nose becomes thick yellow or green.   Diarrhea or vomiting gets worse.   There is any major change in your or your child's condition.   You or your child develops a skin rash, stiff neck, severe headache, or are unable to hold down food or fluid.   You or your child has an oral temperature above 102 F (38.9 C), not controlled by medicine.   Your baby is older than 3 months with a rectal temperature of 102 F (38.9 C) or higher.   Your baby is 52 months old or  younger with a rectal temperature of 100.4 F (38 C) or higher.  Document Released: 03/25/2006 Document Revised: 03/29/2011 Document Reviewed: 03/26/2007 [facility] Patient Information 2012 [place], [place].   Cough, Adult  A cough is a reflex that helps clear your throat and airways. It can help heal the body or may be a reaction to an irritated airway. A cough may only last 2 or 3 weeks (acute) or may last more than 8 weeks (chronic).  CAUSES Acute cough:  Viral or bacterial infections.  Chronic cough:  Infections.   Allergies.   Asthma.   Post-nasal drip.   Smoking.   Heartburn or acid reflux.   Some medicines.   Chronic lung problems (COPD).   Cancer.  SYMPTOMS   Cough.   Fever.   Chest pain.   Increased breathing rate.   High-pitched whistling sound when breathing (wheezing).   Colored mucus that you cough up (sputum).  TREATMENT   A bacterial cough may be treated with antibiotic medicine.   A viral cough must run its course and will not respond to antibiotics.   Your caregiver may recommend other treatments if you have a chronic cough.  HOME CARE INSTRUCTIONS   Only take over-the-counter or prescription medicines for pain, discomfort, or fever as directed by your caregiver. Use cough suppressants only as directed by your caregiver.   Use a cold steam vaporizer or  humidifier in your bedroom or home to help loosen secretions.   Sleep in a semi-upright position if your cough is worse at night.   Rest as needed.   Stop smoking if you smoke.  SEEK IMMEDIATE MEDICAL CARE IF:   You have pus in your sputum.   Your cough starts to worsen.   You cannot control your cough with suppressants and are losing sleep.   You begin coughing up blood.   You have difficulty breathing.   You develop pain which is getting worse or is uncontrolled with medicine.   You have a fever.  MAKE SURE YOU:   Understand these instructions.   Will watch your condition.    Will get help right away if you are not doing well or get worse.  Document Released: 10/06/2010 Document Revised: 03/29/2011 Document Reviewed: 10/06/2010 [facility] Patient Information 2012 [place], [place].   RESOURCE GUIDE  Dental Problems  Patients with Medicaid: [facility] [id number]                                           [phone] [address] Phone:  [phone]                                                  Phone:  [phone]  If unable to pay or uninsured, contact:  [facility] or [facility]. to become qualified for the adult dental clinic.  Chronic Pain Problems Contact [facility]  [phone] Patients need to be referred by their primary care doctor.  Insufficient Money for Medicine Contact United Way:  call "211" or [facility] 7636959051.  No Primary Care Doctor Call [facility]  [phone] Other agencies that provide inexpensive medical care    [facility]  [phone]    [facility]  [phone]    [facility]  [phone]    [facility]  [phone]    [facility]  [phone]    [facility]  [phone]  Psychological Services [facility]  [phone] [facility]  [phone] [facility]   [phone] (emergency services [phone])  Substance Abuse Resources [facility]  [phone] [facility] [phone] [facility] [place] [phone] Floydene Flock [phone] Residential & Outpatient Substance Abuse Program  [phone]  Abuse/Neglect [facility] Child Abuse Hotline (386) 827-6908 [facility] Child Abuse Hotline 707-832-0502 (After Hours)  Emergency Shelter [facility] 512-804-4245  Maternity Homes [facility] [place] [facility] 984-538-6131 [facility] [phone]  MRSA Hotline #:    713-233-6430    [facility] Resources  [facility]     United Way                          [facility] Dept. [address] [place]                       [address]  [place] Phone:  [phone]                                   Phone:  [phone]                 Phone:  [place] Phone:  [place] [phone] (After Hours)

## 2011-08-29 NOTE — Telephone Encounter (Signed)
[facility] Triage Call Report Triage Record Num: [id number] Operator: Kelle Darting Patient Name: [name] Call Date & Time: 08/28/2011 8:11:03PM Patient Phone: [phone] PCP: Illene Regulus Patient Gender: Female PCP Fax : [phone] Patient DOB: 12/01/1965 Practice Name: [facility] Reason for Call: Caller: Akirra/Patient; PCP: Illene Regulus; CB#: [phone]; Call regarding Nausea, Vomiting; Afebrile; LMP: Hyst.; Onset: Noon today 08/28/11; Sx notes: Nausea at noon, went home around 1500, went to lay down then Vomiting and diarrhea since then, has had diarrhea on her self, also vomit is coming out of her nose, has a hx of aspiration and she is afraid that she may do that and has asthma, also having joint pain, last urination was this morning before work; Guideline used: Dehydration; Disp: See ED Immediately d/t no urination for 8 or more hr; Appt. made: No. Pt. states she is going to [facility] ED for eval. Protocol(s) Used: Dehydration Recommended Outcome per Protocol: See ED Immediately Reason for Outcome: No urination for 8 or more hours Care Advice: ~ Another adult should drive. Call EMS 911 if signs and symptoms of shock develop (such as unable to stand due to faintness, dizziness, or lightheadedness; new onset of confusion; slow to respond or difficult to awaken; skin is pale, gray, cool, or moist to touch; severe weakness; loss of consciousness). ~ Write down provider's name. List or place the following in a bag for transport with the patient: current prescription and/or nonprescription medications; alternative treatments, therapies and medications; and street drugs. ~ 08/28/2011 8:21:15PM Page 1 of 1 [email or web address]

## 2011-09-07 ENCOUNTER — Encounter: Payer: Self-pay | Admitting: Internal Medicine

## 2011-09-07 ENCOUNTER — Ambulatory Visit (INDEPENDENT_AMBULATORY_CARE_PROVIDER_SITE_OTHER): Payer: 59 | Admitting: Internal Medicine

## 2011-09-07 VITALS — BP 126/86 | HR 84 | Ht 63.0 in

## 2011-09-07 DIAGNOSIS — K219 Gastro-esophageal reflux disease without esophagitis: Secondary | ICD-10-CM

## 2011-09-07 DIAGNOSIS — J45909 Unspecified asthma, uncomplicated: Secondary | ICD-10-CM

## 2011-09-07 MED ORDER — BUDESONIDE-FORMOTEROL FUMARATE 160-4.5 MCG/ACT IN AERO: 2.0000 | INHALATION_SPRAY | Freq: Two times a day (BID) | RESPIRATORY_TRACT | Status: DC

## 2011-09-07 MED ORDER — LEVALBUTEROL TARTRATE 45 MCG/ACT IN AERO: 2.0000 | INHALATION_SPRAY | Freq: Four times a day (QID) | RESPIRATORY_TRACT | Status: DC | PRN

## 2011-09-07 MED ORDER — LEVOCETIRIZINE DIHYDROCHLORIDE 5 MG PO TABS: 5.0000 mg | ORAL_TABLET | Freq: Every evening | ORAL | Status: DC

## 2011-09-07 MED ORDER — LEVALBUTEROL HCL 1.25 MG/3ML IN NEBU: 1.2500 mg | INHALATION_SOLUTION | Freq: Four times a day (QID) | RESPIRATORY_TRACT | Status: DC | PRN

## 2011-09-07 MED ORDER — FLUTICASONE FUROATE 27.5 MCG/SPRAY NA SUSP: 2.0000 | Freq: Every day | NASAL | Status: DC

## 2011-09-07 MED ORDER — OLOPATADINE HCL 0.1 % OP SOLN: 1.0000 [drp] | Freq: Two times a day (BID) | OPHTHALMIC | Status: DC | PRN

## 2011-09-07 NOTE — Patient Instructions (Signed)
Med refills sent  Please call as needed 

## 2011-09-07 NOTE — Progress Notes (Signed)
Patient ID: [name], female    DOB: 1965-06-25, 46 y.o.   MRN: [id number]  HPI 46 yo AAF with known hx of asthma and allergic rhinitis, GERD  11/16/2010 Acute oV  Pt complains of sinus pressure, left ear discomfort, increased SOB, wheezing, PND, dry cough, tightness in chest, fever x1day.  Has had on/off symptoms for 2 weeks , worse today . 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 She gives history that repeated exposures to flu vaccine were associated with significant systemic febrile illness, cough and myalgias. Exposed to strong perfume in early November and has been coughing since.Dr Debby Bud gave her Depo-Medrol. She has not been able to stop dry coughing. Could not afford Zyflo, offered as an alternative to Singulair. We discussed her history of GERD and esophageal dilatation as it relates to her pattern of cyclical cough.  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 She has declined flu vaccine reporting significant acute illness on the few times she had taken it in the past. We discussed this. After last here she finally improved by the end of November. Hydromet cough syrup has worked well for her. She has learned to pay more attention and realizes the importance of reflux and aspiration related to her asthma symptoms. She thinks she may need a repeat esophageal dilatation for which she has seen Dr. Christella Hartigan in the past.  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 Had been improved after dilatation of esophageal stricture. Acute visit- Arrived acutely tight today-> given back to back neb Xop 0.63 x 2, Depo 80. She woke this morning with nasal congestion. Usual peak flow about 345 but today was 250. She tried to struggle  through a meeting but was sent here because of recognized wheezing and labored breathing. Admits minimal sinus pressure without fever sore throat or purulent discharge. Not aware of a reflux event although that is been a significant factor in the past. She is quite discouraged because she thought she had this under better control after the esophageal dilatation. She arrived here very tight with poor airflow and labored. She was given 2 sequential Xopenex 0.63 nebulizer treatments, then Depo-Medrol. She was significantly improved after these.  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 Had been improved after dilatation of esophageal stricture. Pt states was in ED at [facility]  for aspiration : pt having sob with activity some wheezing. denies any chest congestion  Had a gastroenteritis episode with nausea vomiting and diarrhea, likely viral. Aspirated during this and had to go to hospital. Since then has been well, needing her nebulizer only once. She uses her nebulizer before she uses a rescue inhaler because it works better  CXR 08/28/11-  IMPRESSION:  No acute cardiopulmonary process seen.  Original Report Authenticated By: Tonia Ghent, M.D.   Review of Systems- see HPI Constitutional:   No-   weight loss, night sweats, fevers, chills, fatigue, lassitude. HEENT:   + headaches, No-difficulty swallowing, tooth/dental problems, sore throat,       Some  sneezing, itching, ear ache,  +nasal congestion, post nasal drip,  CV:  No-   chest pain, orthopnea, PND, swelling in lower extremities, anasarca, dizziness, palpitations Resp: No recent acute  shortness  of breath with exertion or at rest.              No-   productive cough,  + non-productive cough,  No- coughing up of blood.              No-   change in color of mucus.  + wheezing.   Skin: No-   rash or lesions. GI:  No- acute  heartburn, indigestion, abdominal pain, nausea,  vomiting,- see HPI GU: MS:  No-   joint pain or swelling.  Neuro-     nothing unusual Psych:  No- change in mood or affect. + depression or anxiety.  No memory loss.     Objective:   Physical Exam General- Alert, Oriented, Affect-appropriate, Distress- none acute; obese  Skin- rash-none, lesions- none, excoriation- none Lymphadenopathy- none Head- atraumatic            Eyes- Gross vision intact, PERRLA, conjunctivae clear secretions            Ears- Hearing, canals-normal            Nose- Clear, no-Septal dev, mucus, polyps, erosion, perforation             Throat- Mallampati II , mucosa clear , drainage- none, tonsils- atrophic Neck- flexible , trachea midline, no stridor , thyroid nl, carotid no bruit Chest - symmetrical excursion , unlabored           Heart/CV- RRR , no murmur , no gallop  , no rub, nl s1 s2                           - JVD- none , edema- none, stasis changes- none, varices- none           Lung- Poor airflow, unlabored,  improved after neb here.            Chest wall-  Abd-  Br/ Gen/ Rectal- Not done, not indicated Extrem- cyanosis- none, clubbing, none, atrophy- none, strength- nl Neuro- grossly intact to observation

## 2011-09-12 NOTE — Assessment & Plan Note (Signed)
Exacerbation consistent by history with an aspiration event during gastroenteritis. She has done better since her hernia surgery. Medications reviewed. She is probably now close to her respiratory baseline

## 2012-02-04 ENCOUNTER — Other Ambulatory Visit: Payer: Self-pay | Admitting: Internal Medicine

## 2012-02-04 ENCOUNTER — Telehealth: Payer: Self-pay | Admitting: Internal Medicine

## 2012-02-04 MED ORDER — ALBUTEROL SULFATE (2.5 MG/3ML) 0.083% IN NEBU: 2.5000 mg | INHALATION_SOLUTION | Freq: Four times a day (QID) | RESPIRATORY_TRACT | Status: DC | PRN

## 2012-02-04 NOTE — Telephone Encounter (Signed)
Last ov 08-22-11. I spoke with the pt and she is requesting an rx for albuterol be sent to [facility] pharmacy for her nebulizer. Pt has xopenex on her list. Pt states she has been on albuterol in the past and it caused palpitations but it worked much better for her breathing. Please advise of ok to send in albuterol instead of xopenex.[name], CMA Allergies  Allergen Reactions  . Influenza A (H1n1) Monoval Vac     Per pt report  . Omalizumab     Geoffry Paradise* REACTION: angioedema-looses airway  . Promethazine Hcl     REACTION: hallucinations

## 2012-02-04 NOTE — Telephone Encounter (Signed)
Per CY-okay to give patient Rx for albuterol 0.083% #25 1 vial every 6 hours via neb prn with prn refills. Pt is aware that Rx has been sent to [facility] out patient pharmacy.

## 2012-02-05 NOTE — Telephone Encounter (Signed)
Please advise if okay to refill. Thanks.  

## 2012-02-05 NOTE — Telephone Encounter (Signed)
Ok to refill 

## 2012-02-18 ENCOUNTER — Ambulatory Visit: Payer: 59 | Admitting: Internal Medicine

## 2012-02-18 DIAGNOSIS — Z0289 Encounter for other administrative examinations: Secondary | ICD-10-CM

## 2012-03-10 ENCOUNTER — Ambulatory Visit (INDEPENDENT_AMBULATORY_CARE_PROVIDER_SITE_OTHER): Payer: 59 | Admitting: Internal Medicine

## 2012-03-10 ENCOUNTER — Encounter: Payer: Self-pay | Admitting: Internal Medicine

## 2012-03-10 ENCOUNTER — Telehealth: Payer: Self-pay | Admitting: Gastroenterology

## 2012-03-10 VITALS — BP 160/100 | HR 102 | Ht 63.0 in

## 2012-03-10 DIAGNOSIS — K224 Dyskinesia of esophagus: Secondary | ICD-10-CM

## 2012-03-10 DIAGNOSIS — J45909 Unspecified asthma, uncomplicated: Secondary | ICD-10-CM

## 2012-03-10 MED ORDER — EPINEPHRINE 0.3 MG/0.3ML IJ DEVI: INTRAMUSCULAR | Status: AC

## 2012-03-10 MED ORDER — BUDESONIDE-FORMOTEROL FUMARATE 160-4.5 MCG/ACT IN AERO: 2.0000 | INHALATION_SPRAY | Freq: Two times a day (BID) | RESPIRATORY_TRACT | Status: DC

## 2012-03-10 MED ORDER — LEVALBUTEROL TARTRATE 45 MCG/ACT IN AERO: 2.0000 | INHALATION_SPRAY | Freq: Four times a day (QID) | RESPIRATORY_TRACT | Status: DC | PRN

## 2012-03-10 MED ORDER — ARFORMOTEROL TARTRATE 15 MCG/2ML IN NEBU: 15.0000 ug | INHALATION_SOLUTION | Freq: Two times a day (BID) | RESPIRATORY_TRACT | Status: DC

## 2012-03-10 MED ORDER — FLUTICASONE FUROATE 27.5 MCG/SPRAY NA SUSP: 2.0000 | Freq: Every day | NASAL | Status: DC

## 2012-03-10 MED ORDER — AMOXICILLIN 500 MG PO CAPS: 500.0000 mg | ORAL_CAPSULE | Freq: Three times a day (TID) | ORAL | Status: DC

## 2012-03-10 MED ORDER — PREDNISONE 10 MG PO TABS: ORAL_TABLET | ORAL | Status: DC

## 2012-03-10 MED ORDER — OLOPATADINE HCL 0.1 % OP SOLN: 1.0000 [drp] | Freq: Two times a day (BID) | OPHTHALMIC | Status: DC | PRN

## 2012-03-10 NOTE — Telephone Encounter (Signed)
Patient was adamant  that she had an appt on the 22 or 24th with Dr. Christella Hartigan.  I explained to her that Dr. Christella Hartigan was at the hospital all week and that we had not seen her since her procedure in February.  I asked her if she was having any problems because I didn't see where she had called to speak to the nurse.  She said that food is getting stuck in her throat again.  Please call her.

## 2012-03-10 NOTE — Telephone Encounter (Signed)
Pt is having trouble swallowing and per the last EGD Dr Christella Hartigan states EGD with Dil as needed.  Do you want me to schedule her for repeat procedure?

## 2012-03-10 NOTE — Telephone Encounter (Signed)
Yes, egd with balloon dilation, LEC moderate sedation.  Until then chew well, eat slowly and take small bites

## 2012-03-10 NOTE — Progress Notes (Signed)
Patient ID: [name], female    DOB: 12-18-1965, 46 y.o.   MRN: [id number]  HPI 46 yo AAF with known hx of asthma and allergic rhinitis, GERD  11/16/2010 Acute oV  Pt complains of sinus pressure, left ear discomfort, increased SOB, wheezing, PND, dry cough, tightness in chest, fever x1day.  Has had on/off symptoms for 2 weeks , worse today . 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 She gives history that repeated exposures to flu vaccine were associated with significant systemic febrile illness, cough and myalgias. Exposed to strong perfume in early November and has been coughing since.Dr Linda Hedges gave her Depo-Medrol. She has not been able to stop dry coughing. Could not afford Zyflo, offered as an alternative to Singulair. We discussed her history of GERD and esophageal dilatation as it relates to her pattern of cyclical cough.  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 She has declined flu vaccine reporting significant acute illness on the few times she had taken it in the past. We discussed this. After last here she finally improved by the end of November. Hydromet cough syrup has worked well for her. She has learned to pay more attention and realizes the importance of reflux and aspiration related to her asthma symptoms. She thinks she may need a repeat esophageal dilatation for which she has seen Dr. Ardis Hughs in the past.  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 Had been improved after dilatation of esophageal stricture. Acute visit- Arrived acutely tight today-> given back to back neb Xop 0.63 x 2, Depo 80. She woke this morning with nasal congestion. Usual peak flow about 345 but today was 250. She tried to struggle  through a meeting but was sent here because of recognized wheezing and labored breathing. Admits minimal sinus pressure without fever sore throat or purulent discharge. Not aware of a reflux event although that is been a significant factor in the past. She is quite discouraged because she thought she had this under better control after the esophageal dilatation. She arrived here very tight with poor airflow and labored. She was given 2 sequential Xopenex 0.63 nebulizer treatments, then Depo-Medrol. She was significantly improved after these.  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 Had been improved after dilatation of esophageal stricture. Pt states was in ED at [facility]  for aspiration : pt having sob with activity some wheezing. denies any chest congestion  Had a gastroenteritis episode with nausea vomiting and diarrhea, likely viral. Aspirated during this and had to go to hospital. Since then has been well, needing her nebulizer only once. She uses her nebulizer before she uses a rescue inhaler because it works better  CXR 08/28/11-  IMPRESSION:  No acute cardiopulmonary process seen.  Original Report Authenticated By: Santa Lighter, M.D.   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 FOLLOWS FOR: "PNA slightly", cough with green phelgm-noticed yesterday. Wheezing as well; Peak flow has started trending down over the last 3 days (was 450 now is 325 this morning). She is up-to-date on pneumonia vaccine and again emphasizes that she is "allergic" to flu vaccine. Scant green sputum. Denies ache, fever, sore throat. Long  history of reflux and previous esophageal dilatation. 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 She is to make an appointment with her GI doctor. She is going on a family cruise in December and is worried about being well for  that.  Review of Systems- see HPI Constitutional:   No-   weight loss, night sweats, fevers, chills, fatigue, lassitude. HEENT:   + headaches, No-difficulty swallowing, tooth/dental problems, sore throat,       Some  sneezing, itching, ear ache,  +nasal congestion, post nasal drip,  CV:  No-   chest pain, orthopnea, PND, swelling in lower extremities, anasarca, dizziness, palpitations Resp: No recent acute  shortness of breath with exertion or at rest.              No-   productive cough,  + non-productive cough,  No- coughing up of blood.              No-   change in color of mucus.  + wheezing.   Skin: No-   rash or lesions. GI:  No- acute  heartburn, indigestion, abdominal pain, nausea, vomiting,- see HPI GU: MS:  No-   joint pain or swelling.  Neuro-     nothing unusual Psych:  No- change in mood or affect. + depression or anxiety.  No memory loss.     Objective:   Physical Exam General- Alert, Oriented, Affect-appropriate, Distress- none acute; obese  Skin- rash-none, lesions- none, excoriation- none Lymphadenopathy- none Head- atraumatic            Eyes- Gross vision intact, PERRLA, conjunctivae clear secretions            Ears- Hearing, canals-normal            Nose- Clear, no-Septal dev, mucus, polyps, erosion, perforation             Throat- Mallampati II , mucosa clear , drainage- none, tonsils- atrophic Neck- flexible , trachea midline, no stridor , thyroid nl, carotid no bruit Chest - symmetrical excursion , unlabored           Heart/CV- RRR , no murmur , no gallop  , no rub, nl s1 s2                           - JVD- none , edema- none, stasis changes- none, varices- none           Lung-  clear, unlabored, no wheeze             Chest wall-  Abd-  Br/ Gen/ Rectal- Not done, not indicated Extrem- cyanosis- none, clubbing, none, atrophy- none, strength- nl Neuro- grossly intact to observation

## 2012-03-10 NOTE — Patient Instructions (Addendum)
Amoxacillin script sent for use now- with one refill  Routine med refills sent to pharmacy  Script for prednisone taper to carry on your trip.  Don't forget to make your GI appointment

## 2012-03-11 ENCOUNTER — Encounter: Payer: Self-pay | Admitting: Internal Medicine

## 2012-03-11 ENCOUNTER — Ambulatory Visit (INDEPENDENT_AMBULATORY_CARE_PROVIDER_SITE_OTHER): Payer: 59 | Admitting: Internal Medicine

## 2012-03-11 ENCOUNTER — Telehealth: Payer: Self-pay | Admitting: Internal Medicine

## 2012-03-11 VITALS — BP 122/84 | HR 99 | Temp 97.7°F | Resp 10 | Wt 267.1 lb

## 2012-03-11 DIAGNOSIS — R1319 Other dysphagia: Secondary | ICD-10-CM

## 2012-03-11 DIAGNOSIS — Z1239 Encounter for other screening for malignant neoplasm of breast: Secondary | ICD-10-CM

## 2012-03-11 DIAGNOSIS — K219 Gastro-esophageal reflux disease without esophagitis: Secondary | ICD-10-CM

## 2012-03-11 DIAGNOSIS — I10 Essential (primary) hypertension: Secondary | ICD-10-CM

## 2012-03-11 DIAGNOSIS — J45901 Unspecified asthma with (acute) exacerbation: Secondary | ICD-10-CM

## 2012-03-11 MED ORDER — SCOPOLAMINE 1 MG/3DAYS TD PT72: 1.0000 | MEDICATED_PATCH | TRANSDERMAL | Status: DC

## 2012-03-11 NOTE — Patient Instructions (Addendum)
Thanks for working with [employer]. Congratulations on your son's success.   1. Solid food dysphagia - most likely recurrent esophageal stricture. Plan  See Dr. Christella Hartigan - likely will need EGD with dilation  Take PPI therapy twice a day until you see Dr. Christella Hartigan  2. Reflux is definitely an asthma trigger. A little acid down the trachea will set you off  3. Asthma control - if you are needing rescue treatment more than twice a week you need to talk to Dr. Maple Hudson about changing your maintenance regimen  4. For the Cruise - scopolamine patch q 72 hrs, start 1 day before departure

## 2012-03-11 NOTE — Assessment & Plan Note (Signed)
Feels dysphagia has gotten worse. Had episode of steak getting stuck with associated burning on Saturday 03/08/12.  1. Likely needs another esophageal dilatation - patient has called Dr. Christella Hartigan' office and is hoping to get in as soon as possible. 2. Liquids and soft solids diet as tolerated.

## 2012-03-11 NOTE — Telephone Encounter (Signed)
Per CY-okay to do both occasionally;she doesn't use Brovana often.

## 2012-03-11 NOTE — Telephone Encounter (Signed)
Spoke with Arlys John from [facility]  Would like to ensure that CY really wants to prescribe both brovana & symbicort due to drug interactions Since long acting bronchodilators are you still wanting to Rx these together or is the patient supposed to be holding one while taking the other??  Dr Maple Hudson please advise. Thanks.

## 2012-03-11 NOTE — Assessment & Plan Note (Addendum)
Currently using rescue medications 6-7 times/week.   1. Albuterol added by Dr. Maple Hudson last week 2. Discuss asthma maintenance regimen with Dr. Maple Hudson with goal of using rescue medications no more than 2 times/week. 3. Advised patient to call if she is needing to use bronchodilator more than 3 times a day.

## 2012-03-11 NOTE — Telephone Encounter (Signed)
I tried to return the pt call but the voice mail was full

## 2012-03-11 NOTE — Telephone Encounter (Signed)
Spoke with pharmacist..okay per CY to use Brovana and Symbicort together occasionally. Pharmacist verified Rx and will call patient when ready.
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
## 2012-03-11 NOTE — Assessment & Plan Note (Addendum)
Has recently started taking PPI again.  Plan  Emphasized importance of GERD control to reduce dysphagia and asthma exacerbations and progression.   Encouraged to discuss with Dr. Christella Hartigan to optimize PPI dosing (once vs twice daily).   Instructed her to increase PPI to BID at this time.  She has already requested appointment with Dr. Christella Hartigan for follow up - she is waiting to hear back.

## 2012-03-11 NOTE — Telephone Encounter (Signed)
Placed a call to the pt her voice mail was full and I was unable to leave a message

## 2012-03-12 ENCOUNTER — Telehealth: Payer: Self-pay

## 2012-03-12 ENCOUNTER — Encounter: Payer: Self-pay | Admitting: Internal Medicine

## 2012-03-12 DIAGNOSIS — R1310 Dysphagia, unspecified: Secondary | ICD-10-CM

## 2012-03-12 MED ORDER — PEG-KCL-NACL-NASULF-NA ASC-C 100 G PO SOLR: 1.0000 | Freq: Once | ORAL | Status: DC

## 2012-03-12 NOTE — Telephone Encounter (Signed)
Pt aware and has been scheduled for egd and previsit

## 2012-03-12 NOTE — Assessment & Plan Note (Signed)
BP Readings from Last 3 Encounters:  03/11/12 122/84  03/10/12 160/100  09/07/11 126/86   OK control on present medications

## 2012-03-12 NOTE — Telephone Encounter (Signed)
Pt will come in tomorrow morning and I will instruct and get paperwork signed for her 03/25/12 procedure

## 2012-03-14 NOTE — Telephone Encounter (Signed)
Pt did come in and get her instructions and signed her paperwork

## 2012-03-14 NOTE — Telephone Encounter (Signed)
Left message on machine to call back  

## 2012-03-18 ENCOUNTER — Encounter: Payer: Self-pay | Admitting: Internal Medicine

## 2012-03-18 ENCOUNTER — Other Ambulatory Visit: Payer: Self-pay | Admitting: Internal Medicine

## 2012-03-18 ENCOUNTER — Telehealth: Payer: Self-pay | Admitting: Internal Medicine

## 2012-03-18 DIAGNOSIS — Z1231 Encounter for screening mammogram for malignant neoplasm of breast: Secondary | ICD-10-CM

## 2012-03-18 DIAGNOSIS — Z1239 Encounter for other screening for malignant neoplasm of breast: Secondary | ICD-10-CM

## 2012-03-18 MED ORDER — OSELTAMIVIR PHOSPHATE 75 MG PO CAPS: 75.0000 mg | ORAL_CAPSULE | Freq: Two times a day (BID) | ORAL | Status: DC

## 2012-03-18 NOTE — Progress Notes (Deleted)
  Subjective:    Patient ID: [name], female    DOB: 03/05/1966, 46 y.o.   MRN: [id number]  HPI    Review of Systems     Objective:   Physical Exam        Assessment & Plan:

## 2012-03-18 NOTE — Telephone Encounter (Signed)
Per CY: okay for tamiflu 75mg  #10  1 po bid.  Thanks.  Called, left detailed message on named voice mail informing pt of CY recs and that her rx has been sent to [facility].  Encouraged pt to call if anything further is needed.

## 2012-03-18 NOTE — Telephone Encounter (Signed)
I spoke with pt and she c/o slight dry cough, body aches, fever of 103, nausea but no vomiting, chills, sweats, hoarseness. She has took nyquil last night and ibuprofen 800 mg. She started the amoxicillin on 03/10/12 and finished this yesterday. She believes she may have the flu and requests tamiflu before she ends up in the hospital. Please advise Dr. Maple Hudson thanks Last OV 03/10/12 Pending OV 08/2012 [facility] outpatient pharmacy  Allergies  Allergen Reactions  . Influenza A (H1n1) Monoval Vac     Per pt report  . Omalizumab     Geoffry Paradise* REACTION: angioedema-looses airway  . Promethazine Hcl     REACTION: hallucinations

## 2012-03-18 NOTE — Telephone Encounter (Signed)
[email or web address]  

## 2012-03-18 NOTE — Telephone Encounter (Signed)
Patient returning call.

## 2012-03-18 NOTE — Assessment & Plan Note (Signed)
Acute exacerbation. This may be an early viral syndrome.  Plan-prednisone taper to carry. Refill her routine meds. Standby prescription for amoxicillin.

## 2012-03-18 NOTE — Telephone Encounter (Signed)
Patient Information:  Caller Name: [name]  Phone: [phone]  Patient: [name]  Gender: Female  DOB: 24-Mar-1966  Age: 46 Years  PCP: Illene Regulus (Adults only)  Pregnant: No   Symptoms  Reason For Call & Symptoms: flu like symptoms; states history of asthma, and would like TamiFlu called in to [facility].  Reviewed Health History In EMR: Yes  Reviewed Medications In EMR: Yes  Reviewed Allergies In EMR: Yes  Date of Onset of Symptoms: 03/18/2012  Any Fever: Yes  Fever Taken: Oral  Fever Time Of Reading: 11:00:00  Fever Last Reading: 103.0 OB:  LMP: Unknown  Guideline(s) Used:  Influenza - Seasonal  Disposition Per Guideline:   Discuss with PCP and Callback by Nurse within 1 Hour  Reason For Disposition Reached:   HIGH RISK (e.g., age > 64 years, pregnant, HIV+, chronic medical condition) and flu symptoms  Advice Given:  N/A  Office Follow Up:  Does the office need to follow up with this patient?: Yes  Instructions For The Office: Patient trying to find ride for appt 1545 as she "aches all over and hurts too much to drive.":  Unable to call in Tamiflu; info to office for provider review/callback.  krs/can  RN Note:  Patient has hx of asthma, and is allergic to flu vaccine, so does not get annual flu vaccine.  Developed high fever and cough AM 03/18/12.  Per standing orders, unable to call in Tamiflu; appt scheduled 1545 03/18/12 with Ms. Sampson Si for flu test.  krs/can

## 2012-03-18 NOTE — Assessment & Plan Note (Signed)
She gives description suggestive of esophageal stricture. We have again discussed reflux precautions and potential adverse impact of reflux on her asthma. She agrees to make early GI followup appointment.

## 2012-03-19 ENCOUNTER — Encounter: Payer: Self-pay | Admitting: Internal Medicine

## 2012-03-19 NOTE — Progress Notes (Signed)
This encounter was created in error - please disregard.

## 2012-03-25 ENCOUNTER — Ambulatory Visit (AMBULATORY_SURGERY_CENTER): Payer: 59 | Admitting: Gastroenterology

## 2012-03-25 ENCOUNTER — Encounter: Payer: Self-pay | Admitting: Gastroenterology

## 2012-03-25 ENCOUNTER — Other Ambulatory Visit: Payer: Self-pay | Admitting: Internal Medicine

## 2012-03-25 VITALS — BP 152/108 | HR 95 | Temp 98.2°F | Resp 19 | Ht 63.0 in | Wt 267.0 lb

## 2012-03-25 DIAGNOSIS — K2970 Gastritis, unspecified, without bleeding: Secondary | ICD-10-CM

## 2012-03-25 DIAGNOSIS — R1319 Other dysphagia: Secondary | ICD-10-CM

## 2012-03-25 DIAGNOSIS — K219 Gastro-esophageal reflux disease without esophagitis: Secondary | ICD-10-CM

## 2012-03-25 DIAGNOSIS — K2990 Gastroduodenitis, unspecified, without bleeding: Secondary | ICD-10-CM

## 2012-03-25 MED ORDER — SODIUM CHLORIDE 0.9 % IV SOLN: 500.0000 mL | INTRAVENOUS | Status: DC

## 2012-03-25 NOTE — Progress Notes (Signed)
Patient did not experience any of the following events: a burn prior to discharge; a fall within the facility; wrong site/side/patient/procedure/implant event; or a hospital transfer or hospital admission upon discharge from the facility. (G8907) Patient did not have preoperative order for IV antibiotic SSI prophylaxis. (G8918)  

## 2012-03-25 NOTE — Telephone Encounter (Signed)
Ok to fill 200 ml, 1 tsp every 12 hours if needed for cough

## 2012-03-25 NOTE — Patient Instructions (Addendum)
YOU HAD AN ENDOSCOPIC PROCEDURE TODAY AT THE [facility]: Refer to the procedure report that was given to you for any specific questions about what was found during the examination.  If the procedure report does not answer your questions, please call your gastroenterologist to clarify.  If you requested that your care partner not be given the details of your procedure findings, then the procedure report has been included in a sealed envelope for you to review at your convenience later.  YOU SHOULD EXPECT: Some feelings of bloating in the abdomen. Passage of more gas than usual.  Walking can help get rid of the air that was put into your GI tract during the procedure and reduce the bloating. If you had a lower endoscopy (such as a colonoscopy or flexible sigmoidoscopy) you may notice spotting of blood in your stool or on the toilet paper. If you underwent a bowel prep for your procedure, then you may not have a normal bowel movement for a few days.  DIET: Your first meal following the procedure should be a light meal and then it is ok to progress to your normal diet.  A half-sandwich or bowl of soup is an example of a good first meal.  Heavy or fried foods are harder to digest and may make you feel nauseous or bloated.  Likewise meals heavy in dairy and vegetables can cause extra gas to form and this can also increase the bloating.  Drink plenty of fluids but you should avoid alcoholic beverages for 24 hours.  ACTIVITY: Your care partner should take you home directly after the procedure.  You should plan to take it easy, moving slowly for the rest of the day.  You can resume normal activity the day after the procedure however you should NOT DRIVE or use heavy machinery for 24 hours (because of the sedation medicines used during the test).    SYMPTOMS TO REPORT IMMEDIATELY: A gastroenterologist can be reached at any hour.  During normal business hours, 8:30 AM to 5:00 PM Monday through Friday,  call [phone].  After hours and on weekends, please call the GI answering service at [phone] who will take a message and have the physician on call contact you.    Following upper endoscopy (EGD)  Vomiting of blood or coffee ground material  New chest pain or pain under the shoulder blades  Painful or persistently difficult swallowing  New shortness of breath  Fever of 100F or higher  Black, tarry-looking stools  FOLLOW UP: If any biopsies were taken you will be contacted by phone or by letter within the next 1-3 weeks.  Call your gastroenterologist if you have not heard about the biopsies in 3 weeks.  Our staff will call the home number listed on your records the next business day following your procedure to check on you and address any questions or concerns that you may have at that time regarding the information given to you following your procedure. This is a courtesy call and so if there is no answer at the home number and we have not heard from you through the emergency physician on call, we will assume that you have returned to your regular daily activities without incident.  SIGNATURES/CONFIDENTIALITY: You and/or your care partner have signed paperwork which will be entered into your electronic medical record.  These signatures attest to the fact that that the information above on your After Visit Summary has been reviewed and is understood.  Full   responsibility of the confidentiality of this discharge information lies with you and/or your care-partner.   Resume medications. Information given on gastritis with discharge instructions. 

## 2012-03-25 NOTE — Telephone Encounter (Signed)
Last OV 03/10/12. Pending OV 09/04/11. Pls advise if okay for refill on Tussirex.

## 2012-03-25 NOTE — Progress Notes (Signed)
Pt coughing and gagging throughout procedure. Post procedure very sleepy and snoring requiring chin tilt to keep sats up greater than 90%.

## 2012-03-25 NOTE — Op Note (Signed)
[facility] [address] [place], [address]   ENDOSCOPY PROCEDURE REPORT  PATIENT: [name], [name]  MR#: [id number] BIRTHDATE: 02-02-1966 , 46  yrs. old GENDER: Female ENDOSCOPIST: Rachael Fee, MD  PROCEDURE DATE:  03/25/2012 PROCEDURE:  EGD, diagnostic ASA CLASS:     Class III INDICATIONS:  smooth, distal esophageal narrowing. EGD August 2009, dilated to 19 mm. This resulted in significant but incomplete improvement in symptoms. Repeat EGD October 2009 dilated the smooth narrowing up to 20 mm; dysphagia again 06/2011 led to another, found "minor narrowing" at GE junciton which was dilated up to 20mm; dysphagia again now MEDICATIONS: Fentanyl 75 mcg IV, Versed 8 mg IV, and These medications were titrated to patient response per physician's verbal order TOPICAL ANESTHETIC: Cetacaine Spray  DESCRIPTION OF PROCEDURE: After the risks benefits and alternatives of the procedure were thoroughly explained, informed consent was obtained.  The LB GIF-H180 [id number] endoscope was introduced through the mouth and advanced to the second portion of the duodenum. Without limitations.  The instrument was slowly withdrawn as the mucosa was fully examined.     There was mild distal gastritis.  The examination was otherwise normal.  GE junction was widely patent and not dilated today. Retroflexed views revealed no abnormalities.     The scope was then withdrawn from the patient and the procedure completed. COMPLICATIONS: There were no complications.  ENDOSCOPIC IMPRESSION: There was mild distal gastritis. The examination was otherwise normal. GE junction was widely patent and not dilated today.  RECOMMENDATIONS: Follow clinically.  Advance diet as tolerated, continue PPI daily.   eSigned:  Rachael Fee, MD 03/25/2012 11:20 AM   CC:  Wyonia Hough, MD

## 2012-03-25 NOTE — Progress Notes (Signed)
1220 Pt refusing to ride in elevator.  States she is terrified of elevators.  Pt ambulated downstairs with assistance of Milford Cage and Laverna Peace RN.  Gait Steady.  Assisted into vehicle.

## 2012-03-26 ENCOUNTER — Other Ambulatory Visit: Payer: 59

## 2012-03-26 ENCOUNTER — Ambulatory Visit (INDEPENDENT_AMBULATORY_CARE_PROVIDER_SITE_OTHER): Payer: 59 | Admitting: Internal Medicine

## 2012-03-26 ENCOUNTER — Telehealth: Payer: Self-pay | Admitting: Internal Medicine

## 2012-03-26 ENCOUNTER — Telehealth: Payer: Self-pay | Admitting: *Deleted

## 2012-03-26 ENCOUNTER — Encounter: Payer: Self-pay | Admitting: Gastroenterology

## 2012-03-26 ENCOUNTER — Encounter: Payer: Self-pay | Admitting: Internal Medicine

## 2012-03-26 VITALS — BP 150/88 | HR 128 | Temp 99.4°F

## 2012-03-26 DIAGNOSIS — R6889 Other general symptoms and signs: Secondary | ICD-10-CM

## 2012-03-26 DIAGNOSIS — J209 Acute bronchitis, unspecified: Secondary | ICD-10-CM

## 2012-03-26 DIAGNOSIS — J111 Influenza due to unidentified influenza virus with other respiratory manifestations: Secondary | ICD-10-CM

## 2012-03-26 DIAGNOSIS — J45909 Unspecified asthma, uncomplicated: Secondary | ICD-10-CM

## 2012-03-26 MED ORDER — OSELTAMIVIR PHOSPHATE 75 MG PO CAPS: 75.0000 mg | ORAL_CAPSULE | Freq: Two times a day (BID) | ORAL | Status: DC

## 2012-03-26 MED ORDER — LEVALBUTEROL HCL 0.63 MG/3ML IN NEBU
0.6300 mg | INHALATION_SOLUTION | Freq: Once | RESPIRATORY_TRACT | Status: AC
  Administered 2012-03-26: 0.63 mg via RESPIRATORY_TRACT

## 2012-03-26 MED ORDER — PREDNISONE 10 MG PO TABS: ORAL_TABLET | ORAL | Status: DC

## 2012-03-26 MED ORDER — HYDROCOD POLST-CHLORPHEN POLST 10-8 MG/5ML PO LQCR: 5.0000 mL | Freq: Two times a day (BID) | ORAL | Status: DC | PRN

## 2012-03-26 MED ORDER — METHYLPREDNISOLONE ACETATE 80 MG/ML IJ SUSP
80.0000 mg | Freq: Once | INTRAMUSCULAR | Status: AC
  Administered 2012-03-26: 80 mg via INTRAMUSCULAR

## 2012-03-26 NOTE — Telephone Encounter (Signed)
Spoke with pt. She states having increased cough, wheezing and SOB. Is worried about ending up in the hospital. Spoke with CDY and he is fine to see the pt this pm. Pt on her way now.

## 2012-03-26 NOTE — Assessment & Plan Note (Signed)
Acute viral syndrome with low-grade fever, myalgias and cough. I don't think this relates to her endoscopy yesterday. She does not seem to have aspirated. Plan-nebulizer treatment with Xopenex, Depo-Medrol, prednisone a day taper, repeat course of Tamiflu, influenza, refill Tussionex, increase fluids and rest.
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
## 2012-03-26 NOTE — Telephone Encounter (Signed)
Refill given by printed RX at OV today with CY.

## 2012-03-26 NOTE — Telephone Encounter (Signed)
No answer. Name identifier. Message to call if any questions or concerns.

## 2012-03-26 NOTE — Patient Instructions (Addendum)
Order- lab- influenza swab  Neb xop 0.63  Depo 80  Script for tamiflu  Script for prednisone taper  Script for tusionex  Lots of fluids and rest

## 2012-03-27 LAB — INFLUENZA A AND B
Inflenza A Ag: NEGATIVE
Influenza B Ag: NEGATIVE

## 2012-04-02 ENCOUNTER — Ambulatory Visit (HOSPITAL_COMMUNITY): Admission: RE | Admit: 2012-04-02 | Payer: 59 | Source: Ambulatory Visit

## 2012-04-02 ENCOUNTER — Ambulatory Visit (HOSPITAL_COMMUNITY)
Admission: RE | Admit: 2012-04-02 | Discharge: 2012-04-02 | Disposition: A | Discharge status: Home / Self Care | Payer: 59 | Source: Ambulatory Visit | Attending: Internal Medicine | Admitting: Internal Medicine

## 2012-04-02 DIAGNOSIS — Z1231 Encounter for screening mammogram for malignant neoplasm of breast: Secondary | ICD-10-CM | POA: Insufficient documentation

## 2012-04-02 DIAGNOSIS — Z1239 Encounter for other screening for malignant neoplasm of breast: Secondary | ICD-10-CM

## 2012-04-02 NOTE — Progress Notes (Signed)
Quick Note:  LMOM with these results ______

## 2012-04-03 ENCOUNTER — Telehealth: Payer: Self-pay | Admitting: Internal Medicine

## 2012-04-03 MED ORDER — PANTOPRAZOLE SODIUM 40 MG PO TBEC: 40.0000 mg | DELAYED_RELEASE_TABLET | Freq: Two times a day (BID) | ORAL | Status: DC

## 2012-04-03 NOTE — Telephone Encounter (Signed)
Rx has been sent to pt's pharmacy. She is aware.

## 2012-04-07 ENCOUNTER — Telehealth: Payer: Self-pay | Admitting: Internal Medicine

## 2012-04-07 MED ORDER — ALBUTEROL SULFATE HFA 108 (90 BASE) MCG/ACT IN AERS: 2.0000 | INHALATION_SPRAY | Freq: Four times a day (QID) | RESPIRATORY_TRACT | Status: DC | PRN

## 2012-04-07 NOTE — Telephone Encounter (Signed)
Per CY-okay to make change to medication. Sent new one to pharmacy.

## 2012-04-07 NOTE — Telephone Encounter (Signed)
Pt requesting ventolin due to price of xopenex  Allergies  Allergen Reactions  . Influenza A (H1n1) Monoval Vac     Per pt report  . Omalizumab     Geoffry Paradise* REACTION: angioedema-looses airway  . Promethazine Hcl     REACTION: hallucinations   Dr Maple Hudson  Please advise.

## 2012-04-10 ENCOUNTER — Other Ambulatory Visit: Payer: Self-pay | Admitting: Internal Medicine

## 2012-04-10 DIAGNOSIS — R928 Other abnormal and inconclusive findings on diagnostic imaging of breast: Secondary | ICD-10-CM

## 2012-04-14 ENCOUNTER — Encounter: Payer: Self-pay | Admitting: Internal Medicine

## 2012-04-14 ENCOUNTER — Telehealth: Payer: Self-pay | Admitting: Obstetrics and Gynecology

## 2012-04-14 NOTE — Telephone Encounter (Signed)
Tc to pt; States had mammogram ordered by PCP which revealed a mass. Has appt at the  breast center 04/22/12. Pt is very anxious as her aunt is just finishing chemo from breast cancer. Wanted DR AVS to be aware. Informed our office also refers to breast center. Supportive care given. May call to schedule appt with Dr AVS to discuss results if desires.  Pt states has not had annual.  Transferred to appts. To schedule.

## 2012-04-21 ENCOUNTER — Encounter: Payer: Self-pay | Admitting: Internal Medicine

## 2012-04-21 NOTE — Telephone Encounter (Signed)
Ok to refill tussionex 

## 2012-04-22 ENCOUNTER — Ambulatory Visit
Admission: RE | Admit: 2012-04-22 | Discharge: 2012-04-22 | Disposition: A | Discharge status: Home / Self Care | Payer: 59 | Source: Ambulatory Visit | Attending: Internal Medicine | Admitting: Internal Medicine

## 2012-04-22 DIAGNOSIS — R928 Other abnormal and inconclusive findings on diagnostic imaging of breast: Secondary | ICD-10-CM

## 2012-04-24 ENCOUNTER — Telehealth: Payer: Self-pay | Admitting: Obstetrics and Gynecology

## 2012-04-24 NOTE — Telephone Encounter (Signed)
The patient called.  Followup mammogram did not find signs of malignancy.  Patient feels better.  Dr. Stefano Gaul

## 2012-05-01 ENCOUNTER — Encounter: Payer: Self-pay | Admitting: Obstetrics and Gynecology

## 2012-05-01 ENCOUNTER — Ambulatory Visit (INDEPENDENT_AMBULATORY_CARE_PROVIDER_SITE_OTHER): Payer: 59 | Admitting: Obstetrics and Gynecology

## 2012-05-01 VITALS — BP 136/78 | Resp 14 | Ht 63.0 in | Wt 261.0 lb

## 2012-05-01 DIAGNOSIS — R350 Frequency of micturition: Secondary | ICD-10-CM

## 2012-05-01 DIAGNOSIS — Z01419 Encounter for gynecological examination (general) (routine) without abnormal findings: Secondary | ICD-10-CM

## 2012-05-01 NOTE — Progress Notes (Signed)
Subjective:    [name] is a 47 y.o. female, No obstetric history on file., who presents for an annual exam. She has some urinary incontinence.  Prior Hysterectomy: Yes    History   Social History  . Marital Status: Married    Spouse Name: N/A    Number of Children: 3  . Years of Education: N/A   Occupational History  . [employer]    at [employer].  working on [employer]  . [phone]    Social History Main Topics  . Smoking status: Never Smoker   . Smokeless tobacco: Never Used  . Alcohol Use: Yes     Comment: occasionally  . Drug Use: No  . Sexually Active: None   Other Topics Concern  . None   Social History Narrative   Married '94-remarried.1 son '88 in college [employer]. SO- good health. Marriage good health. No hx/o abuse. Son has been drafted to a [employer] farm team (Nov '13)    Menstrual cycle:   LMP: No LMP recorded. Patient has had a hysterectomy.           Cycle: hysterectomy  The following portions of the patient's history were reviewed and updated as appropriate: allergies, current medications, past family history, past medical history, past social history, past surgical history and problem list.  Review of Systems Pertinent items are noted in HPI. Breast:Negative for breast lump,nipple discharge or nipple retraction Gastrointestinal: Negative for abdominal pain, change in bowel habits or rectal bleeding Urinary:negative   Objective:    BP 136/78  Resp 14  Ht 5\' 3"  (1.6 m)  Wt 261 lb (118.389 kg)  BMI 46.23 kg/m2    Weight:  Wt Readings from Last 1 Encounters:  05/01/12 261 lb (118.389 kg)          BMI: Body mass index is 46.23 kg/(m^2).  General Appearance: Alert, appropriate appearance for age. No acute distress HEENT: Grossly normal Neck / Thyroid: Supple, no masses, nodes or enlargement Lungs: clear to auscultation bilaterally Back: No CVA tenderness Breast Exam: No masses or nodes.No dimpling, nipple retraction or  discharge. Cardiovascular: Regular rate and rhythm. S1, S2, no murmur Gastrointestinal: Soft, non-tender, no masses or organomegaly  ++++++++++++++++++++++++++++++++++++++++++++++++++++++++  Pelvic Exam: External genitalia: normal general appearance Vaginal: normal without tenderness, induration or masses Cervix: absent Adnexa: normal bimanual exam Uterus: absent Rectovaginal: normal rectal, no masses  ++++++++++++++++++++++++++++++++++++++++++++++++++++++++  Lymphatic Exam: Non-palpable nodes in neck, clavicular, axillary, or inguinal regions Neurologic: Normal speech, no tremor  Psychiatric: Alert and oriented, appropriate affect.   Assessment:    Normal gyn exam   Overweight or obese: Yes   Pelvic relaxation: Yes   Plan:    mammogram return annually or prn Contraception:hysterectomy    STD screen request: No   The updated Pap smear screening guidelines were discussed with the patient. The patient requested that I obtain a Pap smear: No.  Kegel exercises discussed: Yes.  Proper diet and regular exercise were reviewed.  Annual mammograms recommended starting at age 64. Proper breast care was discussed.  Screening colonoscopy is recommended beginning at age 38.  Regular health maintenance was reviewed.  Sleep hygiene was discussed.  Adequate calcium and vitamin D intake was emphasized.  Leonard Schwartz M.D.    Patient ID: [name], female   DOB: 24-Nov-1965, 47 y.o.   MRN: [id number] Last Pap: years ago WNL: Yes Regular Periods:no Contraception: hyst  Monthly Breast exam:yes Tetanus<25yrs:yes Nl.Bladder Function:no Daily BMs:yes Healthy Diet:yes Calcium:no Mammogram:yes Date  of Mammogram: 03/2012 ? mass Exercise:no Have often Exercise: Rare Seatbelt: yes Abuse at home: no Stressful work:yes Sigmoid-colonoscopy: never Bone Density: No PCP: Dr. Illene Regulus Change in PMH: NA/ new pt  Change in FMH:New pt. FHx of HTN, strokes and  DM

## 2012-08-15 ENCOUNTER — Other Ambulatory Visit: Payer: Self-pay | Admitting: Internal Medicine

## 2012-08-17 ENCOUNTER — Telehealth: Payer: Self-pay | Admitting: Internal Medicine

## 2012-08-17 NOTE — Telephone Encounter (Signed)
Pt called at 3:00 am very short of breath and with bad cough complaining of difficulty breathing.  She has used her nebulizer 6 times today without improvement.  I instructed her to go to the ER for evaluation as she did not sound stable enough to stay home and wait for PO steroids to start working. Pt agreed and will head to [facility] ED.

## 2012-08-17 NOTE — Telephone Encounter (Signed)
Noted  

## 2012-08-18 ENCOUNTER — Other Ambulatory Visit: Payer: Self-pay | Admitting: Internal Medicine

## 2012-08-18 ENCOUNTER — Ambulatory Visit (INDEPENDENT_AMBULATORY_CARE_PROVIDER_SITE_OTHER): Payer: 59 | Admitting: Internal Medicine

## 2012-08-18 ENCOUNTER — Encounter: Payer: Self-pay | Admitting: Internal Medicine

## 2012-08-18 ENCOUNTER — Ambulatory Visit (INDEPENDENT_AMBULATORY_CARE_PROVIDER_SITE_OTHER)
Admission: RE | Admit: 2012-08-18 | Discharge: 2012-08-18 | Disposition: A | Discharge status: Home / Self Care | Payer: 59 | Source: Ambulatory Visit | Attending: Internal Medicine | Admitting: Internal Medicine

## 2012-08-18 VITALS — BP 142/90 | HR 106 | Ht 63.0 in

## 2012-08-18 DIAGNOSIS — J45909 Unspecified asthma, uncomplicated: Secondary | ICD-10-CM

## 2012-08-18 MED ORDER — PREDNISONE 10 MG PO TABS: ORAL_TABLET | ORAL | Status: DC

## 2012-08-18 MED ORDER — METHYLPREDNISOLONE ACETATE 80 MG/ML IJ SUSP
80.0000 mg | Freq: Once | INTRAMUSCULAR | Status: AC
  Administered 2012-08-18: 80 mg via INTRAMUSCULAR

## 2012-08-18 MED ORDER — CIPROFLOXACIN HCL 500 MG PO TABS: 500.0000 mg | ORAL_TABLET | Freq: Two times a day (BID) | ORAL | Status: DC

## 2012-08-18 MED ORDER — HYDROCOD POLST-CHLORPHEN POLST 10-8 MG/5ML PO LQCR: 5.0000 mL | Freq: Two times a day (BID) | ORAL | Status: DC | PRN

## 2012-08-18 MED ORDER — LEVALBUTEROL HCL 0.63 MG/3ML IN NEBU
0.6300 mg | INHALATION_SOLUTION | Freq: Once | RESPIRATORY_TRACT | Status: AC
  Administered 2012-08-18: 0.63 mg via RESPIRATORY_TRACT
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
## 2012-08-18 NOTE — Patient Instructions (Addendum)
Neb xop 0.63  Depo 80  Scripts sent for prednisone and for cipro antibiotic  Order- CXR   Dx asthma with bronchitis

## 2012-08-21 ENCOUNTER — Other Ambulatory Visit: Payer: Self-pay | Admitting: Internal Medicine

## 2012-08-21 MED ORDER — PREDNISONE 10 MG PO TABS: ORAL_TABLET | ORAL | Status: DC

## 2012-08-21 NOTE — Progress Notes (Signed)
Quick Note:  Pt aware of results. States she is no better than when seen in office. I spoke with CY about this and he requested she move her Prednisone up to 60 mg daily now and through the weekend. Pt is aware and Rx has been sent to [facility]. Pt also aware if symptoms worsen she will need to go to the ER. ______

## 2012-08-25 ENCOUNTER — Telehealth: Payer: Self-pay | Admitting: Internal Medicine

## 2012-08-25 DIAGNOSIS — J45909 Unspecified asthma, uncomplicated: Secondary | ICD-10-CM

## 2012-08-25 NOTE — Telephone Encounter (Signed)
lmomtcb x1 

## 2012-08-25 NOTE — Assessment & Plan Note (Signed)
Acute bronchitis, probably infection. She questions possible UTI as an antibiotic for both Plan-chest x-ray, nebulizer Xopenex, prednisone taper, Depo-Medrol, Cipro

## 2012-08-26 ENCOUNTER — Ambulatory Visit (INDEPENDENT_AMBULATORY_CARE_PROVIDER_SITE_OTHER)
Admission: RE | Admit: 2012-08-26 | Discharge: 2012-08-26 | Disposition: A | Discharge status: Home / Self Care | Payer: 59 | Source: Ambulatory Visit | Attending: Internal Medicine | Admitting: Internal Medicine

## 2012-08-26 ENCOUNTER — Telehealth: Payer: Self-pay | Admitting: Internal Medicine

## 2012-08-26 DIAGNOSIS — J45909 Unspecified asthma, uncomplicated: Secondary | ICD-10-CM

## 2012-08-26 DIAGNOSIS — R059 Cough, unspecified: Secondary | ICD-10-CM

## 2012-08-26 DIAGNOSIS — R05 Cough: Secondary | ICD-10-CM

## 2012-08-26 NOTE — Telephone Encounter (Signed)
Patient can be reached @ [phone]

## 2012-08-26 NOTE — Telephone Encounter (Signed)
Per CDY: have pt come in tomorrow for depo anytime is fine and CXR

## 2012-08-26 NOTE — Telephone Encounter (Signed)
Pt is aware of CY recommendation. She will come by some time tomorrow. I will place the order for CXR.

## 2012-08-26 NOTE — Telephone Encounter (Signed)
Spoke with pt will come in today for cxr ,order placed. And made aware of how to take the prednisone. Pt verbally understood and Nothing further needed,will close note.

## 2012-08-26 NOTE — Telephone Encounter (Signed)
Per CY-come by for outpatient CXR here in our office DX asthma with bronchitis and Taper Prednisone 50 mg x 2 days, 40 mg x 2 days, 30 mg x 2 days, 20 mg x 2 days, then 10 mg daily (pt has RX).

## 2012-08-26 NOTE — Telephone Encounter (Signed)
Spoke with patient she states she just feels terrible and doesn't want to feel this bad this long, she is requesting a depo injection. Dr. Maple Hudson please advise, thank you.  Last OV:08/18/12 Next OV;09/09/12   Allergies  Allergen Reactions  . Influenza A (H1n1) Monoval Vac     Per pt report  . Omalizumab     Geoffry Paradise* REACTION: angioedema-looses airway  . Promethazine Hcl     REACTION: hallucinations

## 2012-08-26 NOTE — Progress Notes (Signed)
Quick Note:  Spoke with patient, made her aware of results as listed below. Verbalized understanding and nothing further needed at this time. ______

## 2012-08-26 NOTE — Telephone Encounter (Signed)
Spoke with patient, patient was seen in office 08/18/12 for prod cough and fevers. Then she was given instructions on 08/21/12 :  Progress Notes    Ronny Bacon, CMA at 08/21/2012 5:15 PM    Status: Signed             Quick Note:  Pt aware of results. States she is no better than when seen in office. I spoke with CY about this and he requested she move her Prednisone up to 60 mg daily now and through the weekend. Pt is aware and Rx has been sent to [facility]. Pt also aware if symptoms worsen she will need to go to the ER. ______    Patient states she still does not feel ANY better, wants to know is she supposed to taper her pred back down ie. 50mg , 40mg , 20mg  until she gets back to her reg dose or how does Dr. Maple Hudson advise she do this? Please advise thank you

## 2012-08-27 ENCOUNTER — Ambulatory Visit (INDEPENDENT_AMBULATORY_CARE_PROVIDER_SITE_OTHER): Payer: 59 | Admitting: Internal Medicine

## 2012-08-27 ENCOUNTER — Encounter: Payer: Self-pay | Admitting: Internal Medicine

## 2012-08-27 ENCOUNTER — Ambulatory Visit: Payer: 59

## 2012-08-27 VITALS — BP 140/100 | HR 114 | Temp 98.8°F | Ht 63.0 in | Wt 253.0 lb

## 2012-08-27 DIAGNOSIS — J45909 Unspecified asthma, uncomplicated: Secondary | ICD-10-CM

## 2012-09-06 ENCOUNTER — Encounter: Payer: Self-pay | Admitting: Internal Medicine

## 2012-09-06 NOTE — Assessment & Plan Note (Signed)
Sustained exacerbation but she is moving air well. Some of this may be anxiety. She is again cautioned to maintain reflux precautions. Plan Depo-Medrol, fluids, continue routine meds. ER if needed.
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
## 2012-09-06 NOTE — Patient Instructions (Signed)
Depo 80  Continue routine meds

## 2012-09-09 ENCOUNTER — Ambulatory Visit (INDEPENDENT_AMBULATORY_CARE_PROVIDER_SITE_OTHER): Payer: 59 | Admitting: Internal Medicine

## 2012-09-09 ENCOUNTER — Encounter: Payer: Self-pay | Admitting: Internal Medicine

## 2012-09-09 VITALS — BP 152/88 | HR 85 | Ht 63.0 in | Wt 253.0 lb

## 2012-09-09 DIAGNOSIS — J3089 Other allergic rhinitis: Secondary | ICD-10-CM

## 2012-09-09 DIAGNOSIS — J302 Other seasonal allergic rhinitis: Secondary | ICD-10-CM

## 2012-09-09 DIAGNOSIS — J45901 Unspecified asthma with (acute) exacerbation: Secondary | ICD-10-CM

## 2012-09-09 DIAGNOSIS — J309 Allergic rhinitis, unspecified: Secondary | ICD-10-CM

## 2012-09-09 DIAGNOSIS — J4541 Moderate persistent asthma with (acute) exacerbation: Secondary | ICD-10-CM

## 2012-09-09 NOTE — Patient Instructions (Addendum)
Recommend you call Dr Christella Hartigan GI to discuss what more you can do about the ongoing reflux which is causing you to cough. \  Ok to take your Xyzal and regular medicines as before
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
## 2012-09-21 ENCOUNTER — Encounter: Payer: Self-pay | Admitting: Internal Medicine

## 2012-09-21 NOTE — Assessment & Plan Note (Signed)
Plan-have encouraged her to discuss reflux surgery with Dr. Amie Critchley

## 2012-09-21 NOTE — Assessment & Plan Note (Signed)
Mostly postnasal drip this spring. Symptoms overlap with reflux

## 2012-09-26 ENCOUNTER — Other Ambulatory Visit: Payer: Self-pay | Admitting: Internal Medicine

## 2012-09-26 NOTE — Telephone Encounter (Signed)
Ok to refill both?? 

## 2012-09-26 NOTE — Telephone Encounter (Signed)
Please advise if okay to refill. Thanks.  

## 2012-09-26 NOTE — Telephone Encounter (Signed)
Rx's have been called in per CY. 

## 2012-10-13 ENCOUNTER — Ambulatory Visit (INDEPENDENT_AMBULATORY_CARE_PROVIDER_SITE_OTHER): Payer: 59 | Admitting: Gastroenterology

## 2012-10-13 ENCOUNTER — Encounter: Payer: Self-pay | Admitting: Gastroenterology

## 2012-10-13 VITALS — BP 144/80 | HR 76 | Ht 63.0 in | Wt 269.0 lb

## 2012-10-13 DIAGNOSIS — E669 Obesity, unspecified: Secondary | ICD-10-CM

## 2012-10-13 DIAGNOSIS — K219 Gastro-esophageal reflux disease without esophagitis: Secondary | ICD-10-CM

## 2012-10-13 NOTE — Patient Instructions (Addendum)
You should change the way you are taking your antiacid medicine (protonix) so that you are taking it 20-30 minutes prior to a decent meal as that is the way the pill is designed to work most effectively. Try to refrain from eating anything (especially choc, caffeine, peppermint) within 3 hours of bedtime. You need to focus on gradual long term weight loss. You have gained 37 pounds in 1.5 years. Dietary consult to discuss long term gradual weight loss. Please return to see Dr. Christella Hartigan in 3 months.                                               We are excited to introduce MyChart, a new best-in-class service that provides you online access to important information in your electronic medical record. We want to make it easier for you to view your health information - all in one secure location - when and where you need it. We expect MyChart will enhance the quality of care and service we provide.  When you register for MyChart, you can:    View your test results.    Request appointments and receive appointment reminders via email.    Request medication renewals.    View your medical history, allergies, medications and immunizations.    Communicate with your physician's office through a password-protected site.    Conveniently print information such as your medication lists.  To find out if MyChart is right for you, please talk to a member of our clinical staff today. We will gladly answer your questions about this free health and wellness tool.  If you are age 61 or older and want a member of your family to have access to your record, you must provide written consent by completing a proxy form available at our office. Please speak to our clinical staff about guidelines regarding accounts for patients younger than age 67.  As you activate your MyChart account and need any technical assistance, please call the MyChart technical support line at [phone] [phone] or email your question to  [email or web address]. If you email your question(s), please include your name, a return phone number and the best time to reach you.  If you have non-urgent health-related questions, you can send a message to our office through MyChart at [email or web address]. If you have a medical emergency, call 911.  Thank you for using MyChart as your new health and wellness resource!   MyChart licensed from [employer],  [id number]. Patents Pending.
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
## 2012-10-20 ENCOUNTER — Ambulatory Visit: Payer: 59 | Admitting: Internal Medicine

## 2013-01-13 ENCOUNTER — Ambulatory Visit (INDEPENDENT_AMBULATORY_CARE_PROVIDER_SITE_OTHER): Payer: 59 | Admitting: Internal Medicine

## 2013-01-13 ENCOUNTER — Encounter: Payer: Self-pay | Admitting: Internal Medicine

## 2013-01-13 VITALS — BP 140/60 | HR 82 | Ht 63.5 in

## 2013-01-13 DIAGNOSIS — J309 Allergic rhinitis, unspecified: Secondary | ICD-10-CM

## 2013-01-13 DIAGNOSIS — J302 Other seasonal allergic rhinitis: Secondary | ICD-10-CM

## 2013-01-13 DIAGNOSIS — J3089 Other allergic rhinitis: Secondary | ICD-10-CM

## 2013-01-13 DIAGNOSIS — J45901 Unspecified asthma with (acute) exacerbation: Secondary | ICD-10-CM

## 2013-01-13 MED ORDER — METHYLPREDNISOLONE ACETATE 80 MG/ML IJ SUSP
80.0000 mg | Freq: Once | INTRAMUSCULAR | Status: AC
  Administered 2013-01-13: 80 mg via INTRAMUSCULAR
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
## 2013-01-13 NOTE — Patient Instructions (Addendum)
Depo 80  Sample Patanol eye drop     1 drop in each eye daily as needed  Sample Tudorza inhaler    Try 1 puff twice daily as needed for bronchodilator  This can be added to your current medicines.  Letter- flu vaccine exemption

## 2013-01-25 NOTE — Assessment & Plan Note (Signed)
Acute exacerbation of rhinitis with nasal congestion and rubbing eyes. Plan-Depo-Medrol

## 2013-01-25 NOTE — Assessment & Plan Note (Addendum)
Acute exacerbation of rhinitis and beginning to a tighter in her chest although we don't hear wheezing yet. Plan-Depo-Medrol, try sample of Tudorza for effect

## 2013-02-12 ENCOUNTER — Other Ambulatory Visit: Payer: Self-pay | Admitting: Internal Medicine

## 2013-03-11 ENCOUNTER — Other Ambulatory Visit: Payer: Self-pay

## 2013-03-11 DIAGNOSIS — Z1231 Encounter for screening mammogram for malignant neoplasm of breast: Secondary | ICD-10-CM

## 2013-04-10 ENCOUNTER — Ambulatory Visit
Admission: RE | Admit: 2013-04-10 | Discharge: 2013-04-10 | Disposition: A | Discharge status: Home / Self Care | Payer: 59 | Source: Ambulatory Visit

## 2013-04-10 DIAGNOSIS — Z1231 Encounter for screening mammogram for malignant neoplasm of breast: Secondary | ICD-10-CM

## 2013-04-15 ENCOUNTER — Ambulatory Visit (HOSPITAL_COMMUNITY)
Admission: RE | Admit: 2013-04-15 | Discharge: 2013-04-15 | Disposition: A | Discharge status: Home / Self Care | Payer: 59 | Source: Ambulatory Visit | Attending: Internal Medicine | Admitting: Internal Medicine

## 2013-04-15 ENCOUNTER — Ambulatory Visit (HOSPITAL_COMMUNITY)
Admission: RE | Admit: 2013-04-15 | Discharge: 2013-04-15 | Disposition: A | Discharge status: Home / Self Care | Payer: 59 | Source: Ambulatory Visit | Attending: Family Medicine | Admitting: Family Medicine

## 2013-04-15 ENCOUNTER — Other Ambulatory Visit (HOSPITAL_COMMUNITY): Payer: Self-pay | Admitting: Family Medicine

## 2013-04-15 DIAGNOSIS — M25579 Pain in unspecified ankle and joints of unspecified foot: Secondary | ICD-10-CM | POA: Insufficient documentation

## 2013-04-15 DIAGNOSIS — R059 Cough, unspecified: Secondary | ICD-10-CM

## 2013-04-15 DIAGNOSIS — R52 Pain, unspecified: Secondary | ICD-10-CM

## 2013-04-15 DIAGNOSIS — R05 Cough: Secondary | ICD-10-CM

## 2013-04-15 DIAGNOSIS — M7989 Other specified soft tissue disorders: Secondary | ICD-10-CM | POA: Insufficient documentation

## 2013-04-15 DIAGNOSIS — M79609 Pain in unspecified limb: Secondary | ICD-10-CM | POA: Insufficient documentation

## 2013-05-07 ENCOUNTER — Inpatient Hospital Stay (HOSPITAL_COMMUNITY)
Admission: EM | Admit: 2013-05-07 | Discharge: 2013-05-10 | DRG: 202 | Disposition: A | Discharge status: Home / Self Care | Payer: 59 | Attending: Internal Medicine | Admitting: Internal Medicine

## 2013-05-07 ENCOUNTER — Inpatient Hospital Stay (HOSPITAL_COMMUNITY): Payer: 59

## 2013-05-07 ENCOUNTER — Encounter (HOSPITAL_COMMUNITY): Payer: Self-pay | Admitting: Emergency Medicine

## 2013-05-07 DIAGNOSIS — M79605 Pain in left leg: Secondary | ICD-10-CM | POA: Diagnosis present

## 2013-05-07 DIAGNOSIS — J209 Acute bronchitis, unspecified: Secondary | ICD-10-CM | POA: Diagnosis present

## 2013-05-07 DIAGNOSIS — Z833 Family history of diabetes mellitus: Secondary | ICD-10-CM

## 2013-05-07 DIAGNOSIS — Z8249 Family history of ischemic heart disease and other diseases of the circulatory system: Secondary | ICD-10-CM

## 2013-05-07 DIAGNOSIS — J4541 Moderate persistent asthma with (acute) exacerbation: Secondary | ICD-10-CM | POA: Diagnosis present

## 2013-05-07 DIAGNOSIS — M79609 Pain in unspecified limb: Secondary | ICD-10-CM | POA: Diagnosis present

## 2013-05-07 DIAGNOSIS — N179 Acute kidney failure, unspecified: Secondary | ICD-10-CM | POA: Diagnosis present

## 2013-05-07 DIAGNOSIS — Z803 Family history of malignant neoplasm of breast: Secondary | ICD-10-CM

## 2013-05-07 DIAGNOSIS — Z823 Family history of stroke: Secondary | ICD-10-CM

## 2013-05-07 DIAGNOSIS — I10 Essential (primary) hypertension: Secondary | ICD-10-CM | POA: Diagnosis present

## 2013-05-07 DIAGNOSIS — K219 Gastro-esophageal reflux disease without esophagitis: Secondary | ICD-10-CM | POA: Diagnosis present

## 2013-05-07 DIAGNOSIS — R739 Hyperglycemia, unspecified: Secondary | ICD-10-CM | POA: Diagnosis present

## 2013-05-07 DIAGNOSIS — R7309 Other abnormal glucose: Secondary | ICD-10-CM | POA: Diagnosis present

## 2013-05-07 DIAGNOSIS — K224 Dyskinesia of esophagus: Secondary | ICD-10-CM | POA: Diagnosis present

## 2013-05-07 DIAGNOSIS — Z9181 History of falling: Secondary | ICD-10-CM

## 2013-05-07 DIAGNOSIS — R252 Cramp and spasm: Secondary | ICD-10-CM | POA: Diagnosis present

## 2013-05-07 DIAGNOSIS — T380X5A Adverse effect of glucocorticoids and synthetic analogues, initial encounter: Secondary | ICD-10-CM | POA: Diagnosis present

## 2013-05-07 DIAGNOSIS — J45901 Unspecified asthma with (acute) exacerbation: Principal | ICD-10-CM | POA: Diagnosis present

## 2013-05-07 HISTORY — DX: Hyperglycemia, unspecified: R73.9

## 2013-05-07 HISTORY — DX: Adverse effect of glucocorticoids and synthetic analogues, initial encounter: T38.0X5A

## 2013-05-07 MED ORDER — METHYLPREDNISOLONE SODIUM SUCC 125 MG IJ SOLR
80.0000 mg | Freq: Four times a day (QID) | INTRAMUSCULAR | Status: DC
  Administered 2013-05-07 – 2013-05-08 (×3): 80 mg via INTRAMUSCULAR
  Filled 2013-05-07 (×6): qty 1.28

## 2013-05-07 MED ORDER — ENOXAPARIN SODIUM 40 MG/0.4ML ~~LOC~~ SOLN
40.0000 mg | SUBCUTANEOUS | Status: DC
  Filled 2013-05-07 (×4): qty 0.4

## 2013-05-07 MED ORDER — HYDROCOD POLST-CHLORPHEN POLST 10-8 MG/5ML PO LQCR
5.0000 mL | Freq: Four times a day (QID) | ORAL | Status: DC | PRN
  Administered 2013-05-07 – 2013-05-10 (×5): 5 mL via ORAL
  Filled 2013-05-07 (×5): qty 5

## 2013-05-07 MED ORDER — ALBUTEROL (5 MG/ML) CONTINUOUS INHALATION SOLN
10.0000 mg/h | INHALATION_SOLUTION | RESPIRATORY_TRACT | Status: DC
  Administered 2013-05-07: 10 mg/h via RESPIRATORY_TRACT
  Filled 2013-05-07: qty 20

## 2013-05-07 MED ORDER — TRAMADOL HCL 50 MG PO TABS: 50.0000 mg | ORAL_TABLET | ORAL | Status: DC | PRN

## 2013-05-07 MED ORDER — ALBUTEROL SULFATE (2.5 MG/3ML) 0.083% IN NEBU: 2.5000 mg | INHALATION_SOLUTION | RESPIRATORY_TRACT | Status: DC | PRN

## 2013-05-07 MED ORDER — METHYLPREDNISOLONE SODIUM SUCC 125 MG IJ SOLR
125.0000 mg | Freq: Once | INTRAMUSCULAR | Status: AC
  Administered 2013-05-07: 125 mg via INTRAMUSCULAR
  Filled 2013-05-07: qty 2

## 2013-05-07 MED ORDER — ACETAMINOPHEN 325 MG PO TABS
650.0000 mg | ORAL_TABLET | Freq: Four times a day (QID) | ORAL | Status: DC | PRN
  Administered 2013-05-07 – 2013-05-09 (×4): 650 mg via ORAL
  Filled 2013-05-07 (×4): qty 2

## 2013-05-07 MED ORDER — LEVOFLOXACIN IN D5W 500 MG/100ML IV SOLN
500.0000 mg | INTRAVENOUS | Status: DC
  Administered 2013-05-07 – 2013-05-09 (×3): 500 mg via INTRAVENOUS
  Filled 2013-05-07 (×4): qty 100

## 2013-05-07 MED ORDER — IPRATROPIUM BROMIDE 0.02 % IN SOLN
0.5000 mg | Freq: Four times a day (QID) | RESPIRATORY_TRACT | Status: DC
  Administered 2013-05-07: 0.5 mg via RESPIRATORY_TRACT
  Filled 2013-05-07 (×2): qty 2.5

## 2013-05-07 MED ORDER — ALBUTEROL (5 MG/ML) CONTINUOUS INHALATION SOLN
10.0000 mg/h | INHALATION_SOLUTION | Freq: Once | RESPIRATORY_TRACT | Status: AC
  Administered 2013-05-07: 10 mg/h via RESPIRATORY_TRACT

## 2013-05-07 MED ORDER — IPRATROPIUM BROMIDE 0.02 % IN SOLN
0.5000 mg | Freq: Once | RESPIRATORY_TRACT | Status: AC
Start: 2013-05-07 — End: 2013-05-07
  Administered 2013-05-07: 0.5 mg via RESPIRATORY_TRACT
  Filled 2013-05-07: qty 2.5

## 2013-05-07 MED ORDER — ALBUTEROL SULFATE (2.5 MG/3ML) 0.083% IN NEBU
2.5000 mg | INHALATION_SOLUTION | Freq: Four times a day (QID) | RESPIRATORY_TRACT | Status: DC
  Administered 2013-05-07: 2.5 mg via RESPIRATORY_TRACT
  Filled 2013-05-07 (×2): qty 3

## 2013-05-07 MED ORDER — PANTOPRAZOLE SODIUM 40 MG PO TBEC
40.0000 mg | DELAYED_RELEASE_TABLET | Freq: Every day | ORAL | Status: DC
  Administered 2013-05-08 – 2013-05-10 (×3): 40 mg via ORAL
  Filled 2013-05-07 (×3): qty 1

## 2013-05-07 MED ORDER — GUAIFENESIN ER 600 MG PO TB12
600.0000 mg | ORAL_TABLET | Freq: Two times a day (BID) | ORAL | Status: DC
  Administered 2013-05-07 – 2013-05-10 (×6): 600 mg via ORAL
  Filled 2013-05-07 (×7): qty 1

## 2013-05-07 MED ORDER — PREDNISONE 50 MG PO TABS
60.0000 mg | ORAL_TABLET | Freq: Once | ORAL | Status: DC
  Filled 2013-05-07: qty 3

## 2013-05-07 NOTE — ED Notes (Signed)
Pt comes from [facility] c/o allergic reaction to flowers. Pt appears short of breath and wheezing.

## 2013-05-07 NOTE — ED Notes (Signed)
Bed: WA24 Expected date:  Expected time:  Means of arrival:  Comments: Hold- Asthma attack

## 2013-05-07 NOTE — Progress Notes (Signed)
*  Preliminary Results* Left lower extremity venous duplex was completed as per Dr.Joseph. Visualized veins of the left lower extremity are negative for deep vein thrombosis. There is no evidence of left Baker's cyst.  05/07/2013 4:47 PM  Maudry Mayhew, RVT, RDCS, RDMS

## 2013-05-07 NOTE — ED Notes (Signed)
Pt ambulated ~13 feet pt went from 99% RA to 95% on RA and pt had to stop and go back to her room. Pt agreed to stay one night in hospital. Made [facility] EDP aware.
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
## 2013-05-07 NOTE — Progress Notes (Signed)
UR completed 
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
## 2013-05-08 ENCOUNTER — Encounter (HOSPITAL_COMMUNITY): Payer: Self-pay | Admitting: Internal Medicine

## 2013-05-08 DIAGNOSIS — R739 Hyperglycemia, unspecified: Secondary | ICD-10-CM | POA: Diagnosis present

## 2013-05-08 DIAGNOSIS — R7309 Other abnormal glucose: Secondary | ICD-10-CM

## 2013-05-08 DIAGNOSIS — K224 Dyskinesia of esophagus: Secondary | ICD-10-CM

## 2013-05-08 DIAGNOSIS — M79605 Pain in left leg: Secondary | ICD-10-CM | POA: Diagnosis present

## 2013-05-08 DIAGNOSIS — M79609 Pain in unspecified limb: Secondary | ICD-10-CM

## 2013-05-08 DIAGNOSIS — T380X5A Adverse effect of glucocorticoids and synthetic analogues, initial encounter: Secondary | ICD-10-CM

## 2013-05-08 HISTORY — DX: Adverse effect of glucocorticoids and synthetic analogues, initial encounter: T38.0X5A

## 2013-05-08 HISTORY — DX: Hyperglycemia, unspecified: R73.9

## 2013-05-08 LAB — CBC
HCT: 37.1 % (ref 36.0–46.0)
Hemoglobin: 11.8 g/dL — ABNORMAL LOW (ref 12.0–15.0)
MCH: 26.3 pg (ref 26.0–34.0)
MCHC: 31.8 g/dL (ref 30.0–36.0)
MCV: 82.8 fL (ref 78.0–100.0)
Platelets: 358 10*3/uL (ref 150–400)
RBC: 4.48 MIL/uL (ref 3.87–5.11)
RDW: 14.5 % (ref 11.5–15.5)
WBC: 15.8 10*3/uL — ABNORMAL HIGH (ref 4.0–10.5)

## 2013-05-08 LAB — GLUCOSE, CAPILLARY: Glucose-Capillary: 185 mg/dL — ABNORMAL HIGH (ref 70–99)

## 2013-05-08 LAB — BASIC METABOLIC PANEL
BUN: 11 mg/dL (ref 6–23)
CO2: 19 mEq/L (ref 19–32)
Calcium: 9.6 mg/dL (ref 8.4–10.5)
Chloride: 99 mEq/L (ref 96–112)
Creatinine, Ser: 0.83 mg/dL (ref 0.50–1.10)
GFR calc Af Amer: >90 mL/min (ref >90)
GFR calc non Af Amer: 84 mL/min — ABNORMAL LOW (ref >90)
Glucose, Bld: 179 mg/dL — ABNORMAL HIGH (ref 70–99)
Potassium: 3.9 mEq/L (ref 3.7–5.3)
Sodium: 138 mEq/L (ref 137–147)

## 2013-05-08 LAB — INFLUENZA PANEL BY PCR (TYPE A & B)
H1N1 flu by pcr: NOT DETECTED
Influenza A By PCR: NEGATIVE
Influenza B By PCR: NEGATIVE

## 2013-05-08 MED ORDER — GUAIFENESIN-DM 100-10 MG/5ML PO SYRP
5.0000 mL | ORAL_SOLUTION | ORAL | Status: DC | PRN
  Filled 2013-05-08: qty 10

## 2013-05-08 MED ORDER — IPRATROPIUM-ALBUTEROL 0.5-2.5 (3) MG/3ML IN SOLN
3.0000 mL | Freq: Four times a day (QID) | RESPIRATORY_TRACT | Status: DC
  Administered 2013-05-08 (×3): 3 mL via RESPIRATORY_TRACT
  Filled 2013-05-08 (×2): qty 3

## 2013-05-08 MED ORDER — LORAZEPAM 1 MG PO TABS
1.0000 mg | ORAL_TABLET | Freq: Three times a day (TID) | ORAL | Status: DC | PRN
  Administered 2013-05-08 – 2013-05-10 (×3): 1 mg via ORAL
  Filled 2013-05-08 (×3): qty 1

## 2013-05-08 MED ORDER — METHYLPREDNISOLONE SODIUM SUCC 125 MG IJ SOLR
80.0000 mg | Freq: Two times a day (BID) | INTRAMUSCULAR | Status: DC
  Administered 2013-05-08 – 2013-05-09 (×2): 80 mg via INTRAVENOUS
  Filled 2013-05-08 (×3): qty 1.28

## 2013-05-08 MED ORDER — INSULIN ASPART 100 UNIT/ML ~~LOC~~ SOLN
0.0000 [IU] | Freq: Every day | SUBCUTANEOUS | Status: DC
Start: 2013-05-08 — End: 2013-05-10

## 2013-05-08 MED ORDER — INSULIN ASPART 100 UNIT/ML ~~LOC~~ SOLN
0.0000 [IU] | Freq: Three times a day (TID) | SUBCUTANEOUS | Status: DC
  Administered 2013-05-08 – 2013-05-09 (×2): 3 [IU] via SUBCUTANEOUS
  Administered 2013-05-09: 1 [IU] via SUBCUTANEOUS
  Administered 2013-05-09: 3 [IU] via SUBCUTANEOUS
  Administered 2013-05-10: 2 [IU] via SUBCUTANEOUS

## 2013-05-08 NOTE — Progress Notes (Signed)
[facility] PROGRESS NOTE   [name] DTO:[id number] DOB: October 08, 1965 DOA: 05/07/2013 PCP: Adella Hare, MD  Brief narrative: [name] is an 48 y.o. female with a PMH of asthma, allergic rhinitis, and GERD who was admitted on 05/07/13 with shortness of breath and wheezing.  Assessment/Plan: Principal Problem:   Asthma exacerbation Chest x-ray negative. Influenza panel negative. Admitted and placed on empiric Levaquin, high-dose Solu-Medrol, nebulized bronchodilator treatments. Continue antitussives with Tussionex and Mucinex. Wean Solu-Medrol. Active Problems:   Left lower extremity pain Dopplers negative for DVT. Status post recent fall with soft tissue injury.   HYPERTENSION Systolic blood pressure as high as 170. She only takes Lasix as needed at home.   Dyskinesia of esophagus Continue PPI therapy.   Steroid-induced hyperglycemia Add SSI, moderate scale.  Code Status: Full. Family Communication: Husband at bedside (asleep). Disposition Plan: Home when stable.   IV access:  Peripheral IV  Medical Consultants:  None.  Other Consultants:  None.  Anti-infectives:  Levaquin 05/07/13--->  HPI/Subjective: [name] still a bit short of breath. No nausea or vomiting. Still with some left lower extremity pain. Still coughing with expectoration of thick yellow-green mucus.  Objective: Filed Vitals:   05/07/13 2133 05/07/13 2222 05/08/13 0423 05/08/13 0747  BP:  170/87 142/84   Pulse:  107 80   Temp: 98.9 F (37.2 C)  97.7 F (36.5 C)   TempSrc: Oral  Oral   Resp:   20   Height:      Weight:      SpO2: 98%  100% 95%    Intake/Output Summary (Last 24 hours) at 05/08/13 1330 Last data filed at 05/08/13 0900  Gross per 24 hour  Intake    460 ml  Output      0 ml  Net    460 ml    Exam: Gen:  NAD Cardiovascular:  RRR, No M/R/G Respiratory:  Lungs diminished but no current wheezes Gastrointestinal:  Abdomen soft, NT/ND, + BS Extremities:   No C/E/C  Data Reviewed: Basic Metabolic Panel:  Recent Labs Lab 05/08/13 0504  NA 138  K 3.9  CL 99  CO2 19  GLUCOSE 179*  BUN 11  CREATININE 0.83  CALCIUM 9.6   GFR Estimated Creatinine Clearance: 94.2 ml/min (by C-G formula based on Cr of 0.83).  CBC:  Recent Labs Lab 05/08/13 0504  WBC 15.8*  HGB 11.8*  HCT 37.1  MCV 82.8  PLT 358   Microbiology No results found for this or any previous visit (from the past 240 hour(s)).   Procedures and Diagnostic Studies: Dg Chest 2 View  05/07/2013   CLINICAL DATA:  Asthma exacerbation  EXAM: CHEST  2 VIEW  COMPARISON:  08/26/2012  FINDINGS: The heart size and mediastinal contours are within normal limits. Both lungs are clear. The visualized skeletal structures are unremarkable. Lower thoracic and lumbar scoliosis again evident.  IMPRESSION: No active cardiopulmonary disease.   Electronically Signed   By: Daryll Brod M.D.   On: 05/07/2013 16:58    Scheduled Meds: . enoxaparin (LOVENOX) injection  40 mg Subcutaneous Q24H  . guaiFENesin  600 mg Oral BID  . insulin aspart  0-15 Units Subcutaneous TID WC  . insulin aspart  0-5 Units Subcutaneous QHS  . ipratropium-albuterol  3 mL Nebulization Q6H  . levofloxacin (LEVAQUIN) IV  500 mg Intravenous Q24H  . methylPREDNISolone (SOLU-MEDROL) injection  80 mg Intravenous Q12H  . pantoprazole  40 mg Oral Q1200  . predniSONE  60 mg  Oral Once   Continuous Infusions:   Time spent: 25 minutes.   LOS: 1 day   RAMA,CHRISTINA  [facility] Pager [phone].   *Please note that the hospitalists switch teams on Wednesdays. Please call the flow manager at [phone] if you are having difficulty reaching the hospitalist taking care of this patient as she can update you and provide the most up-to-date pager number of provider caring for the patient. If 8PM-8AM, please contact night-coverage at [email or web address], password [facility]  05/08/2013, 1:30 PM

## 2013-05-08 NOTE — Progress Notes (Signed)
Came to visit patient at bedside to explain Link to Wellness program. Left Link to Wellness packet at bedside. She states she will look over it and call office if interested. She will receive post hospital discharge call. Left Link to Wellness packet and contact information at bedside. Appreciative of visit. Marthenia Rolling, MSN, RN,BSN- [facility] [phone]

## 2013-05-08 NOTE — Care Management Note (Signed)
    Page 1 of 1   05/08/2013     12:26:21 PM   CARE MANAGEMENT NOTE 05/08/2013  Patient:  [name], [name]   Account Number:  [id number]  Date Initiated:  05/08/2013  Documentation initiated by:  Dessa Phi  Subjective/Objective Assessment:   48 Y/O F ADMITTED W/ASTHMA EXAC.     Action/Plan:   FROM HOME W/SPOUSE.HAS PCP,PHARMACY.   Anticipated DC Date:  05/10/2013   Anticipated DC Plan:  [place]  CM consult      Choice offered to / List presented to:             Status of service:  In process, will continue to follow Medicare Important Message given?   (If response is "NO", the following Medicare IM given date fields will be blank) Date Medicare IM given:   Date Additional Medicare IM given:    Discharge Disposition:    Per UR Regulation:  Reviewed for med. necessity/level of care/duration of stay  If discussed at [facility] of Stay Meetings, dates discussed:    Comments:  05/08/13 [facility] RN,BSN NCM [phone]

## 2013-05-09 DIAGNOSIS — N179 Acute kidney failure, unspecified: Secondary | ICD-10-CM | POA: Diagnosis present

## 2013-05-09 LAB — BASIC METABOLIC PANEL
BUN: 26 mg/dL — ABNORMAL HIGH (ref 6–23)
CO2: 22 mEq/L (ref 19–32)
Calcium: 9.4 mg/dL (ref 8.4–10.5)
Chloride: 100 mEq/L (ref 96–112)
Creatinine, Ser: 1.14 mg/dL — ABNORMAL HIGH (ref 0.50–1.10)
GFR calc Af Amer: 66 mL/min — ABNORMAL LOW (ref >90)
GFR calc non Af Amer: 57 mL/min — ABNORMAL LOW (ref >90)
Glucose, Bld: 181 mg/dL — ABNORMAL HIGH (ref 70–99)
Potassium: 4.5 mEq/L (ref 3.7–5.3)
Sodium: 137 mEq/L (ref 137–147)

## 2013-05-09 LAB — GLUCOSE, CAPILLARY
Glucose-Capillary: 178 mg/dL — ABNORMAL HIGH (ref 70–99)
Glucose-Capillary: 145 mg/dL — ABNORMAL HIGH (ref 70–99)
Glucose-Capillary: 161 mg/dL — ABNORMAL HIGH (ref 70–99)

## 2013-05-09 LAB — CBC
HCT: 38.4 % (ref 36.0–46.0)
Hemoglobin: 12.2 g/dL (ref 12.0–15.0)
MCH: 26.5 pg (ref 26.0–34.0)
MCHC: 31.8 g/dL (ref 30.0–36.0)
MCV: 83.5 fL (ref 78.0–100.0)
Platelets: 344 10*3/uL (ref 150–400)
RBC: 4.6 MIL/uL (ref 3.87–5.11)
RDW: 14.6 % (ref 11.5–15.5)
WBC: 23.9 10*3/uL — ABNORMAL HIGH (ref 4.0–10.5)

## 2013-05-09 MED ORDER — PREDNISONE 50 MG PO TABS
60.0000 mg | ORAL_TABLET | Freq: Every day | ORAL | Status: DC
  Administered 2013-05-10: 60 mg via ORAL
  Filled 2013-05-09 (×2): qty 1

## 2013-05-09 MED ORDER — IPRATROPIUM-ALBUTEROL 0.5-2.5 (3) MG/3ML IN SOLN
3.0000 mL | Freq: Three times a day (TID) | RESPIRATORY_TRACT | Status: DC
Start: 2013-05-09 — End: 2013-05-10
  Administered 2013-05-09 – 2013-05-10 (×4): 3 mL via RESPIRATORY_TRACT
  Filled 2013-05-09 (×4): qty 3

## 2013-05-09 MED ORDER — SODIUM CHLORIDE 0.9 % IV SOLN
INTRAVENOUS | Status: DC
Start: 2013-05-09 — End: 2013-05-10
  Administered 2013-05-09: 1000 mL via INTRAVENOUS
  Administered 2013-05-09: 23:00:00 via INTRAVENOUS

## 2013-05-09 NOTE — Progress Notes (Signed)
[facility] PROGRESS NOTE   [name] FGH:[id number] DOB: 1965/06/26 DOA: 05/07/2013 PCP: Adella Hare, MD  Brief narrative: [name] is an 48 y.o. female with a PMH of asthma, allergic rhinitis, and GERD who was admitted on 05/07/13 with shortness of breath and wheezing.  Assessment/Plan: Principal Problem:   Asthma exacerbation Chest x-ray negative. Influenza panel negative. Admitted and placed on empiric Levaquin, high-dose Solu-Medrol, nebulized bronchodilator treatments. Continue antitussives with Tussionex and Mucinex. Wean Solu-Medrol. Active Problems:   Acute kidney injury Avoid nephrotoxins. Likely secondary to dehydration given elevated BUN.  Gently hydrate.   Left lower extremity pain Dopplers negative for DVT. Status post recent fall with soft tissue injury.   HYPERTENSION Systolic blood pressure as high as 140-150s. She only takes Lasix as needed at home.   Dyskinesia of esophagus Continue PPI therapy.   Steroid-induced hyperglycemia Currently being managed with SSI, moderate scale.  CBGs 178-185.  Code Status: Full. Family Communication: Husband at bedside (asleep). Disposition Plan: Home when stable.   IV access:  Peripheral IV  Medical Consultants:  None.  Other Consultants:  None.  Anti-infectives:  Levaquin 05/07/13--->  HPI/Subjective: [name] still has some cough with green sputum, occasional dyspnea.  Steroids give her insomnia, but rested for a few hours after taking some Ativan.  Has not been eating or drinking much.  Objective: Filed Vitals:   05/08/13 1517 05/08/13 1956 05/08/13 2244 05/09/13 0524  BP:   145/73 152/73  Pulse:   83 82  Temp:   99.3 F (37.4 C) 97.7 F (36.5 C)  TempSrc:   Oral Oral  Resp:   20 20  Height:      Weight:      SpO2: 95% 97% 100% 98%    Intake/Output Summary (Last 24 hours) at 05/09/13 0803 Last data filed at 05/08/13 1816  Gross per 24 hour  Intake    700 ml  Output      0 ml   Net    700 ml    Exam: Gen:  NAD Cardiovascular:  RRR, No M/R/G Respiratory:  Lungs diminished but no current wheezes Gastrointestinal:  Abdomen soft, NT/ND, + BS Extremities:  No C/E/C  Data Reviewed: Basic Metabolic Panel:  Recent Labs Lab 05/08/13 0504 05/09/13 0505  NA 138 137  K 3.9 4.5  CL 99 100  CO2 19 22  GLUCOSE 179* 181*  BUN 11 26*  CREATININE 0.83 1.14*  CALCIUM 9.6 9.4   GFR Estimated Creatinine Clearance: 68.6 ml/min (by C-G formula based on Cr of 1.14).  CBC:  Recent Labs Lab 05/08/13 0504 05/09/13 0505  WBC 15.8* 23.9*  HGB 11.8* 12.2  HCT 37.1 38.4  MCV 82.8 83.5  PLT 358 344   CBG:  Recent Labs Lab 05/08/13 1806 05/08/13 2110  GLUCAP 185* 178*   Procedures and Diagnostic Studies: Dg Chest 2 View  05/07/2013   CLINICAL DATA:  Asthma exacerbation  EXAM: CHEST  2 VIEW  COMPARISON:  08/26/2012  FINDINGS: The heart size and mediastinal contours are within normal limits. Both lungs are clear. The visualized skeletal structures are unremarkable. Lower thoracic and lumbar scoliosis again evident.  IMPRESSION: No active cardiopulmonary disease.   Electronically Signed   By: Daryll Brod M.D.   On: 05/07/2013 16:58    Scheduled Meds: . enoxaparin (LOVENOX) injection  40 mg Subcutaneous Q24H  . guaiFENesin  600 mg Oral BID  . insulin aspart  0-15 Units Subcutaneous TID WC  . insulin aspart  0-5  Units Subcutaneous QHS  . ipratropium-albuterol  3 mL Nebulization TID  . levofloxacin (LEVAQUIN) IV  500 mg Intravenous Q24H  . methylPREDNISolone (SOLU-MEDROL) injection  80 mg Intravenous Q12H  . pantoprazole  40 mg Oral Q1200  . predniSONE  60 mg Oral Once   Continuous Infusions:   Time spent: 25 minutes.   LOS: 2 days   [name]  [facility] Pager [phone].   *Please note that the hospitalists switch teams on Wednesdays. Please call the flow manager at [phone] if you are having difficulty reaching the hospitalist taking  care of this patient as she can update you and provide the most up-to-date pager number of provider caring for the patient. If 8PM-8AM, please contact night-coverage at [email or web address], password [facility]  05/09/2013, 8:03 AM

## 2013-05-10 LAB — BASIC METABOLIC PANEL
BUN: 26 mg/dL — ABNORMAL HIGH (ref 6–23)
CO2: 25 mEq/L (ref 19–32)
Calcium: 8.8 mg/dL (ref 8.4–10.5)
Chloride: 103 mEq/L (ref 96–112)
Creatinine, Ser: 1.14 mg/dL — ABNORMAL HIGH (ref 0.50–1.10)
GFR calc Af Amer: 66 mL/min — ABNORMAL LOW (ref >90)
GFR calc non Af Amer: 57 mL/min — ABNORMAL LOW (ref >90)
Glucose, Bld: 129 mg/dL — ABNORMAL HIGH (ref 70–99)
Potassium: 4.1 mEq/L (ref 3.7–5.3)
Sodium: 141 mEq/L (ref 137–147)

## 2013-05-10 LAB — GLUCOSE, CAPILLARY
Glucose-Capillary: 140 mg/dL — ABNORMAL HIGH (ref 70–99)
Glucose-Capillary: 135 mg/dL — ABNORMAL HIGH (ref 70–99)

## 2013-05-10 MED ORDER — LEVOFLOXACIN 500 MG PO TABS
500.0000 mg | ORAL_TABLET | Freq: Every day | ORAL | Status: DC
  Administered 2013-05-10: 500 mg via ORAL
  Filled 2013-05-10 (×2): qty 1

## 2013-05-10 MED ORDER — LEVOFLOXACIN 500 MG PO TABS: 500.0000 mg | ORAL_TABLET | Freq: Every day | ORAL | Status: DC

## 2013-05-10 MED ORDER — HYDROCOD POLST-CHLORPHEN POLST 10-8 MG/5ML PO LQCR: 5.0000 mL | Freq: Two times a day (BID) | ORAL | Status: DC | PRN

## 2013-05-10 MED ORDER — PREDNISONE (PAK) 10 MG PO TABS: ORAL_TABLET | ORAL | Status: DC

## 2013-05-10 MED ORDER — LORAZEPAM 1 MG PO TABS: 1.0000 mg | ORAL_TABLET | Freq: Every evening | ORAL | Status: DC | PRN

## 2013-05-10 NOTE — Discharge Summary (Signed)
Physician Discharge Summary  [name] FIE:[id number] DOB: [birth year] DOA: 05/07/2013  PCP: Adella Hare, MD  Admit date: 05/07/2013 Discharge date: 05/10/2013  Recommendations for Outpatient Follow-up:  1. F/U with Dr. Annamaria Boots 05/14/13. 2. Repeat creatinine in 1 week to ensure back to baseline. 3. Recommend close F/U of BP.  Discharge Diagnoses:  Principal Problem:    Asthma exacerbation with acute bronchitis Active Problems:    HYPERTENSION    Dyskinesia of esophagus    Steroid-induced hyperglycemia    Pain of left lower extremity    Acute kidney injury   Discharge Condition: Improved.  Diet recommendation: Carb modified while on steroids.  History of present illness:  [name] is an 48 y.o. female with a PMH of asthma, allergic rhinitis, and GERD who was admitted on 05/07/13 with shortness of breath and wheezing.  Hospital Course by problem:  Principal Problem:  Asthma exacerbation with acute bronchitis  Chest x-ray negative. Influenza panel negative. Admitted and placed on empiric Levaquin, high-dose Solu-Medrol, nebulized bronchodilator treatments. Continue antitussives with Tussionex and Mucinex. Solu-Medrol weaned without return of bronchospasm, will d/c home on prednisone taper and 4 additional days of treatment with Levaquin.  Active Problems:  Acute kidney injury  Avoid nephrotoxins. Likely secondary to dehydration given elevated BUN. No change in renal indices despite IV fluids. Advised to push fluids post d/c.  Recommend re-check of creatinine in 1 week. Left lower extremity pain  Dopplers negative for DVT. Status post recent fall with soft tissue injury.  HYPERTENSION  Systolic blood pressure as high as 140-150s. She only takes Lasix as needed at home. Recommend close F/U of BP off steroids. Dyskinesia of esophagus  Treated with PPI therapy while in the hospital.  Steroid-induced hyperglycemia  Currently being managed with SSI, moderate scale. CBGs  140-185.  Advised to restrict carbs while on steroids at d/c.   Procedures:  None.  Consultations:  None.  Discharge Exam: Filed Vitals:   05/10/13 0700  BP: 163/85  Pulse: 78  Temp: 98.3 F (36.8 C)  Resp: 20   Filed Vitals:   05/09/13 2019 05/09/13 2206 05/10/13 0700 05/10/13 1000  BP:  156/89 163/85   Pulse:  98 78   Temp:  98.6 F (37 C) 98.3 F (36.8 C)   TempSrc:  Oral Oral   Resp:  20 20   Height:      Weight:      SpO2: 97% 100% 100% 98%    Gen:  NAD Cardiovascular:  RRR, No M/R/G Respiratory: Lungs CTAB Gastrointestinal: Abdomen soft, NT/ND with normal active bowel sounds. Extremities: No C/E/C   Discharge Instructions  Discharge Orders   Future Appointments Provider Department Dept Phone   05/14/2013 9:00 AM Deneise Lever, MD [facility] [phone]   Future Orders Complete By Expires   Call MD for:  temperature >100.4  As directed    Call MD for:  As directed    Scheduling Instructions:     Worsening shortness of breath.   Diet Carb Modified  As directed    Scheduling Instructions:     While on prednisone.   Increase activity slowly  As directed        Medication List         albuterol (2.5 MG/3ML) 0.083% nebulizer solution  Commonly known as:  PROVENTIL  Take 2.5 mg by nebulization every 6 (six) hours as needed for wheezing or shortness of breath.     albuterol 108 (90 BASE) MCG/ACT inhaler  Commonly  known as:  PROVENTIL HFA;VENTOLIN HFA  Inhale 2 puffs into the lungs every 6 (six) hours as needed for wheezing.     arformoterol 15 MCG/2ML Nebu  Commonly known as:  BROVANA  Take 2 mLs (15 mcg total) by nebulization 2 (two) times daily.     budesonide-formoterol 160-4.5 MCG/ACT inhaler  Commonly known as:  SYMBICORT  Inhale 2 puffs into the lungs 2 (two) times daily. Rinse mouth     chlorpheniramine-HYDROcodone 10-8 MG/5ML Lqcr  Commonly known as:  TUSSIONEX  Take 5 mLs by mouth every 12 (twelve) hours as needed for  cough.     furosemide 40 MG tablet  Commonly known as:  LASIX  Take 40 mg by mouth daily as needed for fluid.     levofloxacin 500 MG tablet  Commonly known as:  LEVAQUIN  Take 1 tablet (500 mg total) by mouth daily.     LORazepam 1 MG tablet  Commonly known as:  ATIVAN  Take 1 tablet (1 mg total) by mouth at bedtime as needed for sleep.     predniSONE 10 MG tablet  Commonly known as:  STERAPRED UNI-PAK  Take as directed     traMADol 50 MG tablet  Commonly known as:  ULTRAM  Take 50 mg by mouth every 4 (four) hours as needed for moderate pain or severe pain. Pain           Follow-up Information   Schedule an appointment as soon as possible for a visit with Adella Hare, MD. (As needed)    Specialty:  Internal Medicine   Contact information:   [address] [place] [address] [phone]       Follow up with Deneise Lever, MD. (At your scheduled appt time as noted below.)    Specialty:  Pulmonary Disease   Contact information:   [address]  [facility] [place] [address] [phone]        The results of significant diagnostics from this hospitalization (including imaging, microbiology, ancillary and laboratory) are listed below for reference.    Significant Diagnostic Studies: Dg Chest 2 View  05/07/2013   CLINICAL DATA:  Asthma exacerbation  EXAM: CHEST  2 VIEW  COMPARISON:  08/26/2012  FINDINGS: The heart size and mediastinal contours are within normal limits. Both lungs are clear. The visualized skeletal structures are unremarkable. Lower thoracic and lumbar scoliosis again evident.  IMPRESSION: No active cardiopulmonary disease.   Electronically Signed   By: Daryll Brod M.D.   On: 05/07/2013 16:58   Dg Ankle Complete Left  04/15/2013   CLINICAL DATA:  Medial ankle pain, per the patient  EXAM: LEFT ANKLE COMPLETE - 3+ VIEW  COMPARISON:  None.  FINDINGS: Is no evidence of acute fracture nor dislocation. Soft tissue swelling  is identified within the medial and lateral malleolar regions lateral greater than medial. Areas of hypertrophic spine spurring identified anteriorly along the tibial plafond. The ankle mortise is intact.  IMPRESSION: No evidence of acute osseous abnormalities. There are areas of soft tissue swelling within the medial and lateral malleolar regions.   Electronically Signed   By: Margaree Mackintosh M.D.   On: 04/15/2013 10:48   Dg Foot Complete Left  04/15/2013   CLINICAL DATA:  Pain  EXAM: LEFT FOOT - COMPLETE 3+ VIEW  COMPARISON:  None.  FINDINGS: There is no evidence of fracture or dislocation. There is no evidence of arthropathy or other focal bone abnormality. Soft tissues are unremarkable.  IMPRESSION: Negative.   Electronically Signed   By: Margaree Mackintosh M.D.   On: 04/15/2013 10:50    Labs:  Basic Metabolic Panel:  Recent Labs Lab 05/08/13 0504 05/09/13 0505 05/10/13 0515  NA 138 137 141  K 3.9 4.5 4.1  CL 99 100 103  CO2 19 22 25   GLUCOSE 179* 181* 129*  BUN 11 26* 26*  CREATININE 0.83 1.14* 1.14*  CALCIUM 9.6 9.4 8.8   GFR Estimated Creatinine Clearance: 68.6 ml/min (by C-G formula based on Cr of 1.14).  CBC:  Recent Labs Lab 05/08/13 0504 05/09/13 0505  WBC 15.8* 23.9*  HGB 11.8* 12.2  HCT 37.1 38.4  MCV 82.8 83.5  PLT 358 344   CBG:  Recent Labs Lab 05/08/13 2110 05/09/13 1005 05/09/13 1709 05/09/13 2155 05/10/13 0735  GLUCAP 178* 145* 161* 140* 135*    Time coordinating discharge: 25 minutes.  Signed:  Markus Casten  Pager [phone] [facility] 05/10/2013, 10:16 AM

## 2013-05-10 NOTE — Discharge Instructions (Signed)
Acute Bronchitis Bronchitis is inflammation of the airways that extend from the windpipe into the lungs (bronchi). The inflammation often causes mucus to develop. This leads to a cough, which is the most common symptom of bronchitis.  In acute bronchitis, the condition usually develops suddenly and goes away over time, usually in a couple weeks. Smoking, allergies, and asthma can make bronchitis worse. Repeated episodes of bronchitis may cause further lung problems.  CAUSES Acute bronchitis is most often caused by the same virus that causes a cold. The virus can spread from person to person (contagious).  SIGNS AND SYMPTOMS   Cough.   Fever.   Coughing up mucus.   Body aches.   Chest congestion.   Chills.   Shortness of breath.   Sore throat.  DIAGNOSIS  Acute bronchitis is usually diagnosed through a physical exam. Tests, such as chest X-rays, are sometimes done to rule out other conditions.  TREATMENT  Acute bronchitis usually goes away in a couple weeks. Often times, no medical treatment is necessary. Medicines are sometimes given for relief of fever or cough. Antibiotics are usually not needed but may be prescribed in certain situations. In some cases, an inhaler may be recommended to help reduce shortness of breath and control the cough. A cool mist vaporizer may also be used to help thin bronchial secretions and make it easier to clear the chest.  HOME CARE INSTRUCTIONS  Get plenty of rest.   Drink enough fluids to keep your urine clear or pale yellow (unless you have a medical condition that requires fluid restriction). Increasing fluids may help thin your secretions and will prevent dehydration.   Only take over-the-counter or prescription medicines as directed by your health care provider.   Avoid smoking and secondhand smoke. Exposure to cigarette smoke or irritating chemicals will make bronchitis worse. If you are a smoker, consider using nicotine gum or skin  patches to help control withdrawal symptoms. Quitting smoking will help your lungs heal faster.   Reduce the chances of another bout of acute bronchitis by washing your hands frequently, avoiding people with cold symptoms, and trying not to touch your hands to your mouth, nose, or eyes.   Follow up with your health care provider as directed.  SEEK MEDICAL CARE IF: Your symptoms do not improve after 1 week of treatment.  SEEK IMMEDIATE MEDICAL CARE IF:  You develop an increased fever or chills.   You have chest pain.   You have severe shortness of breath.  You have bloody sputum.   You develop dehydration.  You develop fainting.  You develop repeated vomiting.  You develop a severe headache. MAKE SURE YOU:   Understand these instructions.  Will watch your condition.  Will get help right away if you are not doing well or get worse. Document Released: 05/17/2004 Document Revised: 12/10/2012 Document Reviewed: 09/30/2012 [facility] Patient Information 2014 [place].  Asthma, Acute Bronchospasm Acute bronchospasm caused by asthma is also referred to as an asthma attack. Bronchospasm means your air passages become narrowed. The narrowing is caused by inflammation and tightening of the muscles in the air tubes (bronchi) in your lungs. This can make it hard to breath or cause you to wheeze and cough. CAUSES Possible triggers are:  Animal dander from the skin, hair, or feathers of animals.  Dust mites contained in house dust.  Cockroaches.  Pollen from trees or grass.  Mold.  Cigarette or tobacco smoke.  Air pollutants such as dust, household cleaners, hair sprays,  aerosol sprays, paint fumes, strong chemicals, or strong odors.  Cold air or weather changes. Cold air may trigger inflammation. Winds increase molds and pollens in the air.  Strong emotions such as crying or laughing hard.  Stress.  Certain medicines such as aspirin or  beta-blockers.  Sulfites in foods and drinks, such as dried fruits and wine.  Infections or inflammatory conditions, such as a flu, cold, or inflammation of the nasal membranes (rhinitis).  Gastroesophageal reflux disease (GERD). GERD is a condition where stomach acid backs up into your throat (esophagus).  Exercise or strenuous activity. SIGNS AND SYMPTOMS   Wheezing.  Excessive coughing, particularly at night.  Chest tightness.  Shortness of breath. DIAGNOSIS  Your health care provider will ask you about your medical history and perform a physical exam. A chest X-ray or blood testing may be performed to look for other causes of your symptoms or other conditions that may have triggered your asthma attack. TREATMENT  Treatment is aimed at reducing inflammation and opening up the airways in your lungs. Most asthma attacks are treated with inhaled medicines. These include quick relief or rescue medicines (such as bronchodilators) and controller medicines (such as inhaled corticosteroids). These medicines are sometimes given through an inhaler or a nebulizer. Systemic steroid medicine taken by mouth or given through an IV tube also can be used to reduce the inflammation when an attack is moderate or severe. Antibiotic medicines are only used if a bacterial infection is present.  HOME CARE INSTRUCTIONS   Rest.  Drink plenty of liquids. This helps the mucus to remain thin and be easily coughed up. Only use caffeine in moderation and do not use alcohol until you have recovered from your illness.  Do not smoke. Avoid being exposed to secondhand smoke.  You play a critical role in keeping yourself in good health. Avoid exposure to things that cause you to wheeze or to have breathing problems.  Keep your medicines up to date and available. Carefully follow your health care provider's treatment plan.  Take your medicine exactly as prescribed.  When pollen or pollution is bad, keep windows  closed and use an air conditioner or go to places with air conditioning.  Asthma requires careful medical care. See your health care provider for a follow-up as advised. If you are more than [redacted] weeks pregnant and you were prescribed any new medicines, let your obstetrician know about the visit and how you are doing. Follow-up with your health care provider as directed.  After you have recovered from your asthma attack, make an appointment with your outpatient doctor to talk about ways to reduce the likelihood of future attacks. If you do not have a doctor who manages your asthma, make an appointment with a primary care doctor to discuss your asthma. SEEK IMMEDIATE MEDICAL CARE IF:   You are getting worse.  You have trouble breathing. If severe, call your local emergency services (911 in the [place]  You develop chest pain or discomfort.  You are vomiting.  You are not able to keep fluids down.  You are coughing up yellow, green, brown, or bloody sputum.  You have a fever and your symptoms suddenly get worse.  You have trouble swallowing. MAKE SURE YOU:   Understand these instructions.  Will watch your condition.  Will get help right away if you are not doing well or get worse. Document Released: 07/25/2006 Document Revised: 12/10/2012 Document Reviewed: 10/15/2012 [facility] Patient Information 2014 [place], [place].

## 2013-05-14 ENCOUNTER — Encounter: Payer: Self-pay | Admitting: Internal Medicine

## 2013-05-14 ENCOUNTER — Ambulatory Visit (INDEPENDENT_AMBULATORY_CARE_PROVIDER_SITE_OTHER): Payer: 59 | Admitting: Internal Medicine

## 2013-05-14 VITALS — BP 128/80 | HR 88 | Ht 63.5 in

## 2013-05-14 DIAGNOSIS — J45909 Unspecified asthma, uncomplicated: Secondary | ICD-10-CM

## 2013-05-14 NOTE — Patient Instructions (Signed)
We can continue present meds.   Remember, if your peak flow falls below 200, or earlier if you need- call us so we can help you get turned around.   We will get an office spirometry at next visit   For dx asthma moderate, complicated
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
## 2013-05-21 ENCOUNTER — Encounter: Payer: Self-pay | Admitting: Internal Medicine

## 2013-05-26 ENCOUNTER — Encounter: Payer: Self-pay | Admitting: Internal Medicine

## 2013-05-26 ENCOUNTER — Ambulatory Visit (INDEPENDENT_AMBULATORY_CARE_PROVIDER_SITE_OTHER): Payer: 59 | Admitting: Internal Medicine

## 2013-05-26 VITALS — BP 134/102 | HR 90 | Temp 98.8°F

## 2013-05-26 DIAGNOSIS — J45909 Unspecified asthma, uncomplicated: Secondary | ICD-10-CM

## 2013-05-26 DIAGNOSIS — J45901 Unspecified asthma with (acute) exacerbation: Secondary | ICD-10-CM

## 2013-05-26 DIAGNOSIS — R799 Abnormal finding of blood chemistry, unspecified: Secondary | ICD-10-CM

## 2013-05-26 DIAGNOSIS — R7989 Other specified abnormal findings of blood chemistry: Secondary | ICD-10-CM

## 2013-05-26 MED ORDER — PREDNISONE 10 MG PO TABS: 20.0000 mg | ORAL_TABLET | ORAL | Status: DC

## 2013-05-26 MED ORDER — SODIUM CHLORIDE 0.9 % IV SOLN
125.0000 mg | Freq: Once | INTRAVENOUS | Status: AC
  Administered 2013-05-26: 125 mg via INTRAMUSCULAR

## 2013-05-26 MED ORDER — SODIUM CHLORIDE 0.9 % IV SOLN: 125.0000 mg | Freq: Once | INTRAVENOUS | Status: DC

## 2013-05-26 MED ORDER — METHYLPREDNISOLONE SODIUM SUCC 125 MG IJ SOLR: 125.0000 mg | Freq: Once | INTRAMUSCULAR | Status: DC

## 2013-05-26 NOTE — Patient Instructions (Signed)
Sorry you had such a flare.  Plan Solumedrol 125 mg today  Prednisone 10 mg: 20 mg daily x 5, 10 mg  X 10 days.  Continue all your maintenance meds

## 2013-05-26 NOTE — Progress Notes (Signed)
Pre visit review using our clinic review tool, if applicable. No additional management support is needed unless otherwise documented below in the visit note. 
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
## 2013-05-26 NOTE — Progress Notes (Signed)
Patient refused to have weight take today.

## 2013-05-27 NOTE — Assessment & Plan Note (Signed)
At follow up Feb 3 '15 - still with some respiratory discomfort, expiratory wheezing. She has been able to return to work.  Plan Extend steroid taper: 20 mg qd x 5, 10 mg qd x 10  Solumedrol IM in the office today

## 2013-06-10 NOTE — Assessment & Plan Note (Signed)
Triggers for this episode were thought to be cold air and tobacco smoke. Reflux is still a potential trigger, as is medication noncompliance. Plan-education, office spirometry when she is more near baseline

## 2013-06-30 ENCOUNTER — Encounter: Payer: Self-pay | Admitting: Internal Medicine

## 2013-06-30 ENCOUNTER — Ambulatory Visit (INDEPENDENT_AMBULATORY_CARE_PROVIDER_SITE_OTHER): Payer: 59 | Admitting: Internal Medicine

## 2013-06-30 ENCOUNTER — Other Ambulatory Visit (INDEPENDENT_AMBULATORY_CARE_PROVIDER_SITE_OTHER): Payer: 59

## 2013-06-30 VITALS — BP 138/90 | HR 71 | Ht 63.0 in

## 2013-06-30 DIAGNOSIS — J45909 Unspecified asthma, uncomplicated: Secondary | ICD-10-CM

## 2013-06-30 DIAGNOSIS — J3089 Other allergic rhinitis: Secondary | ICD-10-CM

## 2013-06-30 DIAGNOSIS — K219 Gastro-esophageal reflux disease without esophagitis: Secondary | ICD-10-CM

## 2013-06-30 DIAGNOSIS — J309 Allergic rhinitis, unspecified: Secondary | ICD-10-CM

## 2013-06-30 DIAGNOSIS — J302 Other seasonal allergic rhinitis: Secondary | ICD-10-CM

## 2013-06-30 LAB — BASIC METABOLIC PANEL
BUN: 14 mg/dL (ref 6–23)
CO2: 29 mEq/L (ref 19–32)
Calcium: 9.3 mg/dL (ref 8.4–10.5)
Chloride: 102 mEq/L (ref 96–112)
Creatinine, Ser: 1 mg/dL (ref 0.4–1.2)
GFR: 81.41 mL/min (ref >60.00)
Glucose, Bld: 88 mg/dL (ref 70–99)
Potassium: 3.7 mEq/L (ref 3.5–5.1)
Sodium: 137 mEq/L (ref 135–145)

## 2013-06-30 MED ORDER — LORAZEPAM 1 MG PO TABS: 1.0000 mg | ORAL_TABLET | Freq: Every evening | ORAL | Status: DC | PRN

## 2013-06-30 MED ORDER — TRAMADOL HCL 50 MG PO TABS: 50.0000 mg | ORAL_TABLET | ORAL | Status: DC | PRN

## 2013-06-30 MED ORDER — PANTOPRAZOLE SODIUM 40 MG PO TBEC: 40.0000 mg | DELAYED_RELEASE_TABLET | Freq: Every day | ORAL | Status: DC

## 2013-06-30 MED ORDER — ALBUTEROL SULFATE (2.5 MG/3ML) 0.083% IN NEBU: 2.5000 mg | INHALATION_SOLUTION | Freq: Four times a day (QID) | RESPIRATORY_TRACT | Status: DC | PRN

## 2013-06-30 MED ORDER — HYDROCOD POLST-CHLORPHEN POLST 10-8 MG/5ML PO LQCR: 5.0000 mL | Freq: Two times a day (BID) | ORAL | Status: DC | PRN

## 2013-06-30 NOTE — Patient Instructions (Addendum)
Meds refilled  Please call as needed  Office spirometry - dx asthma  Order- lab- BMET  Dx asthma
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
## 2013-07-02 ENCOUNTER — Telehealth: Payer: Self-pay | Admitting: Internal Medicine

## 2013-07-02 NOTE — Progress Notes (Signed)
Quick Note:  Patient returned call. Advised of lab results / recs as stated by CY. Pt verbalized understanding and denied any questions. ______

## 2013-07-02 NOTE — Telephone Encounter (Signed)
LMTCB-ask for Katie.  

## 2013-07-02 NOTE — Progress Notes (Signed)
Quick Note:  Patient returned call. Advised of lab results / recs as stated by CY. Pt verbalized understanding and denied any questions. ______ 

## 2013-07-07 NOTE — Telephone Encounter (Signed)
Katie please advise as to what we can advise pt of? thanks

## 2013-07-07 NOTE — Telephone Encounter (Signed)
Notes Recorded by Rinaldo Ratel, CMA on 07/02/2013 at 4:41 PM Patient returned call. Advised of lab results / recs as stated by CY. Pt verbalized understanding and denied any questions. ------  Notes Recorded by Inge Rise, CMA on 07/02/2013 at 2:58 PM lmtcb ------  Notes Recorded by Deneise Lever, MD on 07/01/2013 at 4:57 PM Normal basic chemistry panel

## 2013-07-18 NOTE — Assessment & Plan Note (Signed)
Controlled on protonix.   

## 2013-07-18 NOTE — Assessment & Plan Note (Signed)
Fair control Plan-tramadol for cough, refill meds solution and Tussionex. Office spirometry done.BMET

## 2013-07-18 NOTE — Assessment & Plan Note (Signed)
No seasonal exacerbation yet

## 2013-08-20 ENCOUNTER — Telehealth: Payer: Self-pay | Admitting: Critical Care Medicine

## 2013-08-20 NOTE — Telephone Encounter (Signed)
This is a former pt of mine who wishes to reestablish asthma care   Adrian with me  Can schedule anytime in May or june

## 2013-08-21 NOTE — Telephone Encounter (Signed)
Called, spoke with pt. We have scheduled her to see PW on May 8 at 36 am at [facility].  She verbalized understanding and voiced no further questions or concerns at this time.

## 2013-08-24 ENCOUNTER — Telehealth: Payer: Self-pay | Admitting: Internal Medicine

## 2013-08-24 MED ORDER — AMOXICILLIN-POT CLAVULANATE 875-125 MG PO TABS: 1.0000 | ORAL_TABLET | Freq: Two times a day (BID) | ORAL | Status: DC

## 2013-08-24 NOTE — Telephone Encounter (Signed)
Spoke with patient-? Sinus infection-nasal drainage is green with streaks of blood in it. Feels bad over all; has slight fever of 24F.   Allergies  Allergen Reactions  . Influenza A (H1n1) Monoval Vac     Per pt report  . Omalizumab     Arvid Right* REACTION: angioedema-looses airway  . Promethazine Hcl     REACTION: hallucinations   Pharmacy is [facility]; would like abx for this.

## 2013-08-24 NOTE — Telephone Encounter (Signed)
Offer augmentin 875, # 14, 1 twice daily 

## 2013-08-24 NOTE — Telephone Encounter (Signed)
Called and spoke with pt and she is aware of abx that has been sent to the pharmacy per CY.  Pt voiced her understanding and nothing further is needed.

## 2013-08-28 ENCOUNTER — Encounter: Payer: Self-pay | Admitting: Critical Care Medicine

## 2013-08-28 ENCOUNTER — Ambulatory Visit (INDEPENDENT_AMBULATORY_CARE_PROVIDER_SITE_OTHER): Payer: 59 | Admitting: Critical Care Medicine

## 2013-08-28 VITALS — BP 142/88 | HR 82 | Temp 97.6°F | Ht 63.0 in

## 2013-08-28 DIAGNOSIS — J45901 Unspecified asthma with (acute) exacerbation: Secondary | ICD-10-CM

## 2013-08-28 DIAGNOSIS — J019 Acute sinusitis, unspecified: Secondary | ICD-10-CM

## 2013-08-28 MED ORDER — AEROCHAMBER MV MISC: Status: AC

## 2013-08-28 MED ORDER — PREDNISONE 10 MG PO TABS: ORAL_TABLET | ORAL | Status: DC

## 2013-08-28 MED ORDER — BECLOMETHASONE DIPROPIONATE 80 MCG/ACT IN AERS: 2.0000 | INHALATION_SPRAY | Freq: Two times a day (BID) | RESPIRATORY_TRACT | Status: DC

## 2013-08-28 MED ORDER — AZELASTINE-FLUTICASONE 137-50 MCG/ACT NA SUSP: NASAL | Status: DC

## 2013-08-28 MED ORDER — PANTOPRAZOLE SODIUM 40 MG PO TBEC: DELAYED_RELEASE_TABLET | ORAL | Status: DC

## 2013-08-28 MED ORDER — AEROCHAMBER MV MISC: Status: DC

## 2013-08-28 NOTE — Patient Instructions (Signed)
Stop veramyst Start Dymista one puff twice daily Prednisone 10mg  Take 4 tablets daily for 5 days then stop Finish augmentin Use sinus rinse twice daily, suggest neilmed Add Qvar two puff twice daily 85mcg Stay on symbicort  Increase protonix to twice daily before meals Reflux diet STRICT REturn 2 weeks recheck Dr Annamaria Boots can continue to follow you
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
## 2013-08-29 NOTE — Assessment & Plan Note (Signed)
Moderate persistent asthma with recurrent exacerbation due to GERD, Atopy, acute sinusitis, post nasal drip syndrome HFA technique poor, better with spacer Plan Stop veramyst Start Dymista one puff twice daily Prednisone 10mg  Take 4 tablets daily for 5 days then stop Finish augmentin Use sinus rinse twice daily, suggest neilmed Add Qvar two puff twice daily 52mcg Stay on symbicort  Increase protonix to twice daily before meals Reflux diet STRICT REturn 2 weeks recheck

## 2013-09-08 ENCOUNTER — Other Ambulatory Visit: Payer: Self-pay | Admitting: Internal Medicine

## 2013-09-11 ENCOUNTER — Ambulatory Visit: Payer: 59 | Admitting: Critical Care Medicine

## 2013-09-18 ENCOUNTER — Encounter: Payer: Self-pay | Admitting: Critical Care Medicine

## 2013-09-18 ENCOUNTER — Ambulatory Visit (INDEPENDENT_AMBULATORY_CARE_PROVIDER_SITE_OTHER): Payer: 59 | Admitting: Critical Care Medicine

## 2013-09-18 VITALS — BP 162/98 | HR 98 | Temp 98.4°F | Ht 63.0 in

## 2013-09-18 DIAGNOSIS — J45901 Unspecified asthma with (acute) exacerbation: Secondary | ICD-10-CM

## 2013-09-18 NOTE — Patient Instructions (Signed)
No change in medications Eat three meals per day, follow reflux diet Remember purse lip breathing Return 2 months
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
## 2013-09-19 NOTE — Assessment & Plan Note (Signed)
Moderate persistent asthma with GERD and allergic factors Plan No change in medications Cont inhaled meds as rx Follow strict reflux diet Return 2 months

## 2013-10-08 ENCOUNTER — Other Ambulatory Visit: Payer: Self-pay | Admitting: Internal Medicine

## 2013-12-31 ENCOUNTER — Other Ambulatory Visit: Payer: Self-pay | Admitting: Internal Medicine

## 2013-12-31 ENCOUNTER — Ambulatory Visit (INDEPENDENT_AMBULATORY_CARE_PROVIDER_SITE_OTHER): Payer: 59 | Admitting: Internal Medicine

## 2013-12-31 ENCOUNTER — Encounter: Payer: Self-pay | Admitting: Internal Medicine

## 2013-12-31 VITALS — BP 148/82 | HR 72 | Temp 98.2°F | Ht 63.0 in

## 2013-12-31 DIAGNOSIS — J45901 Unspecified asthma with (acute) exacerbation: Secondary | ICD-10-CM

## 2013-12-31 MED ORDER — SCOPOLAMINE 1 MG/3DAYS TD PT72: 1.0000 | MEDICATED_PATCH | TRANSDERMAL | Status: DC

## 2013-12-31 MED ORDER — LEVALBUTEROL HCL 0.63 MG/3ML IN NEBU
0.6300 mg | INHALATION_SOLUTION | Freq: Once | RESPIRATORY_TRACT | Status: AC
  Administered 2013-12-31: 0.63 mg via RESPIRATORY_TRACT

## 2013-12-31 MED ORDER — METHYLPREDNISOLONE ACETATE 80 MG/ML IJ SUSP
80.0000 mg | Freq: Once | INTRAMUSCULAR | Status: AC
  Administered 2013-12-31: 80 mg via INTRAMUSCULAR

## 2013-12-31 MED ORDER — EPINEPHRINE 0.3 MG/0.3ML IJ SOAJ: INTRAMUSCULAR | Status: DC

## 2013-12-31 MED ORDER — HYDROCOD POLST-CHLORPHEN POLST 10-8 MG/5ML PO LQCR: 5.0000 mL | Freq: Two times a day (BID) | ORAL | Status: DC | PRN

## 2013-12-31 MED ORDER — PREDNISONE 10 MG PO TABS: ORAL_TABLET | ORAL | Status: DC

## 2013-12-31 MED ORDER — AZITHROMYCIN 250 MG PO TABS: ORAL_TABLET | ORAL | Status: DC

## 2013-12-31 NOTE — Patient Instructions (Addendum)
Scripts sent for prednisone, Epipen, TransDerm Scop and Zpak  Neb xop 0.63  Depo 80  Refill script printed for tussionex,   Stick with your reflux prevention measures and diet
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
## 2013-12-31 NOTE — Assessment & Plan Note (Signed)
She is not good at recognizing early symptoms or following an action plan, despite coaching, Plan- predniosne with discussion, Z pak, neb xopenex, depomedrol

## 2014-01-04 ENCOUNTER — Telehealth: Payer: Self-pay | Admitting: Internal Medicine

## 2014-01-04 MED ORDER — LORAZEPAM 1 MG PO TABS: 1.0000 mg | ORAL_TABLET | Freq: Every evening | ORAL | Status: DC | PRN

## 2014-01-04 NOTE — Telephone Encounter (Signed)
Called and spoke with pt and she is requesting a refill of the lorazepam.  Pt stated that she has not slept since she last saw CY.  CY please advise.  Last refill of the lorazepam was given on 06/30/2013 with 5 refills.    Last ov--12/31/2013 Next ov--04/01/2014  Allergies  Allergen Reactions  . Influenza A (H1n1) Monoval Vac     Per pt report  . Omalizumab     Arvid Right* REACTION: angioedema-looses airway  . Promethazine Hcl     REACTION: hallucinations    Current Outpatient Prescriptions on File Prior to Visit  Medication Sig Dispense Refill  . albuterol (PROVENTIL) (2.5 MG/3ML) 0.083% nebulizer solution Take 3 mLs (2.5 mg total) by nebulization every 6 (six) hours as needed for wheezing or shortness of breath.  75 mL  prn  . Azelastine-Fluticasone (DYMISTA) 137-50 MCG/ACT SUSP One puff twice daily ea nostril  23 g  11  . azithromycin (ZITHROMAX) 250 MG tablet 2 today then one daily  6 each  0  . beclomethasone (QVAR) 80 MCG/ACT inhaler Inhale 2 puffs into the lungs 2 (two) times daily.  1 Inhaler  12  . budesonide-formoterol (SYMBICORT) 160-4.5 MCG/ACT inhaler Inhale 2 puffs into the lungs 2 (two) times daily. Rinse mouth  1 Inhaler  prn  . chlorpheniramine-HYDROcodone (TUSSIONEX) 10-8 MG/5ML LQCR Take 5 mLs by mouth every 12 (twelve) hours as needed for cough.  115 mL  0  . EPINEPHrine 0.3 mg/0.3 mL IJ SOAJ injection Inject into thigh for severe asthma/ allergicreaction  1 Device  prn  . furosemide (LASIX) 40 MG tablet Take 40 mg by mouth daily as needed for fluid.      Marland Kitchen levocetirizine (XYZAL) 5 MG tablet TAKE 1 TABLET (5 MG TOTAL) BY MOUTH EVERY EVENING.  30 tablet  11  . LORazepam (ATIVAN) 1 MG tablet Take 1 tablet (1 mg total) by mouth at bedtime as needed for sleep.  30 tablet  5  . olopatadine (PATANOL) 0.1 % ophthalmic solution Place 1 drop into both eyes 2 (two) times daily.      . pantoprazole (PROTONIX) 40 MG tablet Twice daily before meal  60 tablet  11  . predniSONE  (DELTASONE) 10 MG tablet 4 X 2 DAYS, 3 X 2 DAYS, 2 X 2 DAYS, 1 X 2 DAYS  20 tablet  1  . scopolamine (TRANSDERM-SCOP) 1 MG/3DAYS Place 1 patch (1.5 mg total) onto the skin every 3 (three) days.  10 patch  12  . Spacer/Aero-Holding Chambers (AEROCHAMBER MV) inhaler Use as instructed  1 each  0  . traMADol (ULTRAM) 50 MG tablet Take 1 tablet (50 mg total) by mouth every 4 (four) hours as needed for moderate pain or severe pain. Pain  30 tablet  5  . VENTOLIN HFA 108 (90 BASE) MCG/ACT inhaler INHALE 2 PUFFS BY MOUTH INTO THE LUNGS EVERY 6 HOURS AS NEEDED FOR WHEEZING  18 g  11   No current facility-administered medications on file prior to visit.

## 2014-01-04 NOTE — Telephone Encounter (Signed)
Rx printed and placed on [name]'s cart to sign  Spoke with pt and made aware rx will be faxed over today  Nothing further needed

## 2014-01-04 NOTE — Telephone Encounter (Signed)
Ok to refill as before 

## 2014-01-20 ENCOUNTER — Other Ambulatory Visit: Payer: Self-pay | Admitting: Occupational Medicine

## 2014-01-20 DIAGNOSIS — IMO0001 Reserved for inherently not codable concepts without codable children: Secondary | ICD-10-CM

## 2014-01-25 ENCOUNTER — Ambulatory Visit (HOSPITAL_COMMUNITY)
Admission: RE | Admit: 2014-01-25 | Discharge: 2014-01-25 | Disposition: A | Discharge status: Home / Self Care | Payer: 59 | Source: Ambulatory Visit | Attending: Occupational Medicine | Admitting: Occupational Medicine

## 2014-01-25 DIAGNOSIS — IMO0001 Reserved for inherently not codable concepts without codable children: Secondary | ICD-10-CM

## 2014-02-08 ENCOUNTER — Other Ambulatory Visit: Payer: Self-pay | Admitting: Occupational Medicine

## 2014-02-08 ENCOUNTER — Ambulatory Visit (HOSPITAL_COMMUNITY): Payer: 59

## 2014-02-18 ENCOUNTER — Ambulatory Visit
Admission: RE | Admit: 2014-02-18 | Discharge: 2014-02-18 | Disposition: A | Discharge status: Home / Self Care | Payer: Worker's Compensation | Source: Ambulatory Visit | Attending: Occupational Medicine | Admitting: Occupational Medicine

## 2014-02-18 ENCOUNTER — Ambulatory Visit: Admission: RE | Admit: 2014-02-18 | Payer: Worker's Compensation | Source: Ambulatory Visit

## 2014-02-18 ENCOUNTER — Other Ambulatory Visit: Payer: Self-pay | Admitting: Occupational Medicine

## 2014-02-18 DIAGNOSIS — IMO0001 Reserved for inherently not codable concepts without codable children: Secondary | ICD-10-CM

## 2014-02-26 ENCOUNTER — Other Ambulatory Visit: Payer: Self-pay | Admitting: Internal Medicine

## 2014-02-26 NOTE — Telephone Encounter (Signed)
Pt calling a/b her prescript for tramadol and also she is needing a prescript written for tussinix.[name]

## 2014-02-26 NOTE — Telephone Encounter (Signed)
Called and spoke with pt and she stated that she needs a refill of the tramadol and tussionex.  Wanted to pick this up today before we close.  CY please advise. Thanks  Last ov--12/31/13 Next ov--04/01/14  Allergies  Allergen Reactions  . Influenza A (H1n1) Monoval Vac     Per pt report  . Omalizumab     Arvid Right* REACTION: angioedema-looses airway  . Promethazine Hcl     REACTION: hallucinations    Current Outpatient Prescriptions on File Prior to Visit  Medication Sig Dispense Refill  . albuterol (PROVENTIL) (2.5 MG/3ML) 0.083% nebulizer solution Take 3 mLs (2.5 mg total) by nebulization every 6 (six) hours as needed for wheezing or shortness of breath. 75 mL prn  . Azelastine-Fluticasone (DYMISTA) 137-50 MCG/ACT SUSP One puff twice daily ea nostril 23 g 11  . azithromycin (ZITHROMAX) 250 MG tablet 2 today then one daily 6 each 0  . beclomethasone (QVAR) 80 MCG/ACT inhaler Inhale 2 puffs into the lungs 2 (two) times daily. 1 Inhaler 12  . budesonide-formoterol (SYMBICORT) 160-4.5 MCG/ACT inhaler Inhale 2 puffs into the lungs 2 (two) times daily. Rinse mouth 1 Inhaler prn  . chlorpheniramine-HYDROcodone (TUSSIONEX) 10-8 MG/5ML LQCR Take 5 mLs by mouth every 12 (twelve) hours as needed for cough. 115 mL 0  . EPINEPHrine 0.3 mg/0.3 mL IJ SOAJ injection Inject into thigh for severe asthma/ allergicreaction 1 Device prn  . furosemide (LASIX) 40 MG tablet Take 40 mg by mouth daily as needed for fluid.    Marland Kitchen levocetirizine (XYZAL) 5 MG tablet TAKE 1 TABLET (5 MG TOTAL) BY MOUTH EVERY EVENING. 30 tablet 11  . LORazepam (ATIVAN) 1 MG tablet Take 1 tablet (1 mg total) by mouth at bedtime as needed for sleep. 30 tablet 5  . pantoprazole (PROTONIX) 40 MG tablet Twice daily before meal 60 tablet 11  . PATANOL 0.1 % ophthalmic solution INSTILL 1 DROP INTO BOTH EYES TWO TIMES DAILY AS NEEDED FOR ALLERGIES 5 mL PRN  . predniSONE (DELTASONE) 10 MG tablet 4 X 2 DAYS, 3 X 2 DAYS, 2 X 2 DAYS, 1 X 2 DAYS 20  tablet 1  . scopolamine (TRANSDERM-SCOP) 1 MG/3DAYS Place 1 patch (1.5 mg total) onto the skin every 3 (three) days. 10 patch 12  . Spacer/Aero-Holding Chambers (AEROCHAMBER MV) inhaler Use as instructed 1 each 0  . traMADol (ULTRAM) 50 MG tablet Take 1 tablet (50 mg total) by mouth every 4 (four) hours as needed for moderate pain or severe pain. Pain 30 tablet 5  . VENTOLIN HFA 108 (90 BASE) MCG/ACT inhaler INHALE 2 PUFFS BY MOUTH INTO THE LUNGS EVERY 6 HOURS AS NEEDED FOR WHEEZING 18 g 11   No current facility-administered medications on file prior to visit.

## 2014-02-26 NOTE — Telephone Encounter (Signed)
CY Please advise on refill and Rx for Tussionex. Thanks.

## 2014-02-27 NOTE — Telephone Encounter (Signed)
Ok to refill 

## 2014-03-05 NOTE — Telephone Encounter (Signed)
Ok to refill 

## 2014-03-17 ENCOUNTER — Other Ambulatory Visit: Payer: Self-pay

## 2014-03-17 DIAGNOSIS — Z1239 Encounter for other screening for malignant neoplasm of breast: Secondary | ICD-10-CM

## 2014-03-22 ENCOUNTER — Ambulatory Visit: Payer: PRIVATE HEALTH INSURANCE | Attending: Orthopedic Surgery

## 2014-03-22 DIAGNOSIS — M25462 Effusion, left knee: Secondary | ICD-10-CM | POA: Diagnosis present

## 2014-03-22 DIAGNOSIS — W010XXD Fall on same level from slipping, tripping and stumbling without subsequent striking against object, subsequent encounter: Secondary | ICD-10-CM | POA: Diagnosis not present

## 2014-03-22 DIAGNOSIS — R609 Edema, unspecified: Secondary | ICD-10-CM

## 2014-03-22 DIAGNOSIS — M25562 Pain in left knee: Secondary | ICD-10-CM | POA: Insufficient documentation

## 2014-03-22 DIAGNOSIS — R29898 Other symptoms and signs involving the musculoskeletal system: Secondary | ICD-10-CM

## 2014-03-22 DIAGNOSIS — R262 Difficulty in walking, not elsewhere classified: Secondary | ICD-10-CM | POA: Insufficient documentation

## 2014-03-22 NOTE — Patient Instructions (Addendum)
Instructed in HEP for short arc quads and sitting knee flexion stretching. She was instructed in removal of kineseotape if irritating or in 4 days if hse feels it is of any benefit.
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
## 2014-03-24 ENCOUNTER — Ambulatory Visit: Payer: PRIVATE HEALTH INSURANCE | Attending: Orthopedic Surgery | Admitting: Rehabilitation

## 2014-03-24 DIAGNOSIS — M25462 Effusion, left knee: Secondary | ICD-10-CM | POA: Insufficient documentation

## 2014-03-24 DIAGNOSIS — R262 Difficulty in walking, not elsewhere classified: Secondary | ICD-10-CM | POA: Diagnosis not present

## 2014-03-24 DIAGNOSIS — M25562 Pain in left knee: Secondary | ICD-10-CM | POA: Insufficient documentation

## 2014-03-24 DIAGNOSIS — R29898 Other symptoms and signs involving the musculoskeletal system: Secondary | ICD-10-CM

## 2014-03-24 DIAGNOSIS — R609 Edema, unspecified: Secondary | ICD-10-CM

## 2014-03-24 NOTE — Patient Instructions (Signed)
Quad Set  PLACE SMALL TOWEL ROLL UNDER KNEE FOR COMFORT With other leg bent, foot flat, slowly tighten muscles on thigh of straight leg while counting out loud to __5__ Repeat _10___ times. Do __2__ sessions per day.  http://[email or web address].exer.us/276   Copyright  VHI. All rights reserved.
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
## 2014-03-29 ENCOUNTER — Ambulatory Visit: Payer: PRIVATE HEALTH INSURANCE

## 2014-03-29 ENCOUNTER — Telehealth: Payer: Self-pay | Admitting: *Deleted

## 2014-03-29 DIAGNOSIS — R262 Difficulty in walking, not elsewhere classified: Secondary | ICD-10-CM

## 2014-03-29 DIAGNOSIS — R29898 Other symptoms and signs involving the musculoskeletal system: Secondary | ICD-10-CM

## 2014-03-29 DIAGNOSIS — M25562 Pain in left knee: Secondary | ICD-10-CM

## 2014-03-29 DIAGNOSIS — M25662 Stiffness of left knee, not elsewhere classified: Secondary | ICD-10-CM

## 2014-03-29 DIAGNOSIS — R609 Edema, unspecified: Secondary | ICD-10-CM

## 2014-03-29 DIAGNOSIS — M25462 Effusion, left knee: Secondary | ICD-10-CM | POA: Diagnosis not present

## 2014-03-29 NOTE — Telephone Encounter (Signed)
appts made and printed...td 
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
## 2014-03-29 NOTE — Patient Instructions (Signed)
Continue to ice  

## 2014-03-31 ENCOUNTER — Encounter: Payer: Worker's Compensation | Admitting: Rehabilitation

## 2014-04-01 ENCOUNTER — Ambulatory Visit: Payer: 59 | Admitting: Internal Medicine

## 2014-04-01 ENCOUNTER — Encounter: Payer: Self-pay | Admitting: Internal Medicine

## 2014-04-01 VITALS — BP 100/74 | HR 105 | Ht 63.0 in | Wt 272.4 lb

## 2014-04-01 DIAGNOSIS — J45909 Unspecified asthma, uncomplicated: Secondary | ICD-10-CM

## 2014-04-01 MED ORDER — PREDNISONE 10 MG PO TABS: ORAL_TABLET | ORAL | Status: DC

## 2014-04-01 MED ORDER — HYDROCOD POLST-CHLORPHEN POLST 10-8 MG/5ML PO LQCR: 5.0000 mL | Freq: Two times a day (BID) | ORAL | Status: DC | PRN

## 2014-04-01 NOTE — Patient Instructions (Signed)
Script printed for prednisone taper in case needed through the holidays  Script printed for cough syrup  Please call if needed
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
## 2014-04-05 ENCOUNTER — Ambulatory Visit: Payer: PRIVATE HEALTH INSURANCE

## 2014-04-08 ENCOUNTER — Ambulatory Visit: Payer: PRIVATE HEALTH INSURANCE

## 2014-04-12 ENCOUNTER — Ambulatory Visit: Payer: PRIVATE HEALTH INSURANCE

## 2014-04-12 ENCOUNTER — Ambulatory Visit
Admission: RE | Admit: 2014-04-12 | Discharge: 2014-04-12 | Disposition: A | Discharge status: Home / Self Care | Payer: 59 | Source: Ambulatory Visit

## 2014-04-12 DIAGNOSIS — M25562 Pain in left knee: Secondary | ICD-10-CM

## 2014-04-12 DIAGNOSIS — R609 Edema, unspecified: Secondary | ICD-10-CM

## 2014-04-12 DIAGNOSIS — M25462 Effusion, left knee: Secondary | ICD-10-CM | POA: Diagnosis not present

## 2014-04-12 DIAGNOSIS — Z1239 Encounter for other screening for malignant neoplasm of breast: Secondary | ICD-10-CM

## 2014-04-12 DIAGNOSIS — R262 Difficulty in walking, not elsewhere classified: Secondary | ICD-10-CM

## 2014-04-12 DIAGNOSIS — R29898 Other symptoms and signs involving the musculoskeletal system: Secondary | ICD-10-CM

## 2014-04-12 DIAGNOSIS — M25662 Stiffness of left knee, not elsewhere classified: Secondary | ICD-10-CM
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
## 2014-04-14 ENCOUNTER — Ambulatory Visit: Payer: PRIVATE HEALTH INSURANCE

## 2014-04-14 ENCOUNTER — Telehealth: Payer: Self-pay | Admitting: *Deleted

## 2014-04-14 DIAGNOSIS — M25562 Pain in left knee: Secondary | ICD-10-CM

## 2014-04-14 DIAGNOSIS — M25662 Stiffness of left knee, not elsewhere classified: Secondary | ICD-10-CM

## 2014-04-14 DIAGNOSIS — M25462 Effusion, left knee: Secondary | ICD-10-CM | POA: Diagnosis not present

## 2014-04-14 DIAGNOSIS — R29898 Other symptoms and signs involving the musculoskeletal system: Secondary | ICD-10-CM

## 2014-04-14 DIAGNOSIS — R262 Difficulty in walking, not elsewhere classified: Secondary | ICD-10-CM

## 2014-04-14 DIAGNOSIS — R609 Edema, unspecified: Secondary | ICD-10-CM

## 2014-04-14 NOTE — Telephone Encounter (Signed)
appts made and printed...td 
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
## 2014-04-23 HISTORY — PX: COLONOSCOPY: SHX174

## 2014-04-27 ENCOUNTER — Ambulatory Visit: Payer: 59 | Attending: Orthopedic Surgery

## 2014-04-27 DIAGNOSIS — R29898 Other symptoms and signs involving the musculoskeletal system: Secondary | ICD-10-CM | POA: Diagnosis not present

## 2014-04-27 DIAGNOSIS — R609 Edema, unspecified: Secondary | ICD-10-CM | POA: Diagnosis present

## 2014-04-27 DIAGNOSIS — M25562 Pain in left knee: Secondary | ICD-10-CM | POA: Insufficient documentation

## 2014-04-27 DIAGNOSIS — R262 Difficulty in walking, not elsewhere classified: Secondary | ICD-10-CM | POA: Diagnosis not present

## 2014-04-27 DIAGNOSIS — M25662 Stiffness of left knee, not elsewhere classified: Secondary | ICD-10-CM | POA: Diagnosis not present
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
## 2014-05-04 ENCOUNTER — Ambulatory Visit: Payer: 59

## 2014-05-11 ENCOUNTER — Ambulatory Visit: Payer: 59

## 2014-05-11 DIAGNOSIS — R262 Difficulty in walking, not elsewhere classified: Secondary | ICD-10-CM

## 2014-05-11 DIAGNOSIS — R609 Edema, unspecified: Secondary | ICD-10-CM

## 2014-05-11 DIAGNOSIS — M25662 Stiffness of left knee, not elsewhere classified: Secondary | ICD-10-CM

## 2014-05-11 DIAGNOSIS — M25562 Pain in left knee: Secondary | ICD-10-CM

## 2014-05-11 DIAGNOSIS — R29898 Other symptoms and signs involving the musculoskeletal system: Secondary | ICD-10-CM
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
## 2014-07-02 ENCOUNTER — Other Ambulatory Visit: Payer: Self-pay | Admitting: Internal Medicine

## 2014-07-02 ENCOUNTER — Ambulatory Visit (INDEPENDENT_AMBULATORY_CARE_PROVIDER_SITE_OTHER): Payer: 59 | Admitting: Internal Medicine

## 2014-07-02 ENCOUNTER — Encounter: Payer: Self-pay | Admitting: Internal Medicine

## 2014-07-02 VITALS — BP 126/70 | HR 80 | Ht 63.0 in

## 2014-07-02 DIAGNOSIS — J4540 Moderate persistent asthma, uncomplicated: Secondary | ICD-10-CM

## 2014-07-02 DIAGNOSIS — J302 Other seasonal allergic rhinitis: Secondary | ICD-10-CM

## 2014-07-02 DIAGNOSIS — R0602 Shortness of breath: Secondary | ICD-10-CM

## 2014-07-02 DIAGNOSIS — K219 Gastro-esophageal reflux disease without esophagitis: Secondary | ICD-10-CM

## 2014-07-02 DIAGNOSIS — J309 Allergic rhinitis, unspecified: Secondary | ICD-10-CM

## 2014-07-02 DIAGNOSIS — J3089 Other allergic rhinitis: Secondary | ICD-10-CM

## 2014-07-02 MED ORDER — HYDROCOD POLST-CHLORPHEN POLST 10-8 MG/5ML PO LQCR: 5.0000 mL | Freq: Two times a day (BID) | ORAL | Status: DC | PRN

## 2014-07-02 NOTE — Patient Instructions (Signed)
Script for Tussionex (Brand only)  Ok to drop Qvar and just use Symbicort 160   2 puffs then rinse mouth, twice daily  We will want to minimize systemic steroids as you get closer to your surgery date  Order- office spirometry   Dx asthma, moderate intermittent
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
## 2014-07-03 NOTE — Assessment & Plan Note (Signed)
Recent exacerbation resolved with prednisone. We discussed risk benefit and long-term side effect issues of prednisone again. Seek to minimize systemic steroids. Plan-medication list reviewed. Continue Symbicort 160, refill Tussionex with discussion

## 2014-07-03 NOTE — Assessment & Plan Note (Signed)
Anticipate spring pollen season. Discussed nasal steroids and antihistamines.

## 2014-07-03 NOTE — Assessment & Plan Note (Signed)
Continue reflux precautions 

## 2014-07-05 ENCOUNTER — Other Ambulatory Visit: Payer: Self-pay | Admitting: *Deleted

## 2014-07-05 MED ORDER — BUDESONIDE-FORMOTEROL FUMARATE 160-4.5 MCG/ACT IN AERO
2.0000 | INHALATION_SPRAY | Freq: Two times a day (BID) | RESPIRATORY_TRACT | Status: DC
Start: 2014-07-05 — End: 2015-05-31

## 2014-08-11 NOTE — Patient Instructions (Addendum)
YOUR PROCEDURE IS SCHEDULED ON :  08/23/14  REPORT TO [facility] MAIN ENTRANCE FOLLOW SIGNS TO SHORT STAY CENTER AT :  5:30AM  CALL THIS NUMBER IF YOU HAVE PROBLEMS THE MORNING OF SURGERY [phone]  REMEMBER:  DO NOT EAT FOOD OR DRINK LIQUIDS AFTER MIDNIGHT  TAKE THESE MEDICINES THE MORNING OF SURGERY: PROTONIX / USE INHALERS AS USUAL  YOU MAY NOT HAVE ANY METAL ON YOUR BODY INCLUDING HAIR PINS AND PIERCING'S. DO NOT WEAR JEWELRY, MAKEUP, LOTIONS, POWDERS OR PERFUMES. DO NOT WEAR NAIL POLISH. DO NOT SHAVE 48 HRS PRIOR TO SURGERY. MEN MAY SHAVE FACE AND NECK.  DO NOT [name]. [facility] IS NOT RESPONSIBLE FOR VALUABLES.  CONTACTS, DENTURES OR PARTIALS MAY NOT BE WORN TO SURGERY. LEAVE SUITCASE IN CAR. CAN BE BROUGHT TO ROOM AFTER SURGERY.  PATIENTS DISCHARGED THE DAY OF SURGERY WILL NOT BE ALLOWED TO DRIVE HOME.  PLEASE READ OVER THE FOLLOWING INSTRUCTION SHEETS _________________________________________________________________________________                                          [facility] - PREPARING FOR SURGERY  Before surgery, you can play an important role.  Because skin is not sterile, your skin needs to be as free of germs as possible.  You can reduce the number of germs on your skin by washing with CHG (chlorahexidine gluconate) soap before surgery.  CHG is an antiseptic cleaner which kills germs and bonds with the skin to continue killing germs even after washing. Please DO NOT use if you have an allergy to CHG or antibacterial soaps.  If your skin becomes reddened/irritated stop using the CHG and inform your nurse when you arrive at Short Stay. Do not shave (including legs and underarms) for at least 48 hours prior to the first CHG shower.  You may shave your face. Please follow these instructions carefully:   1.  Shower with CHG Soap the night before surgery and the  morning of Surgery.   2.  If you choose to wash your hair,  wash your hair first as usual with your  normal  Shampoo.   3.  After you shampoo, rinse your hair and body thoroughly to remove the  shampoo.                                         4.  Use CHG as you would any other liquid soap.  You can apply chg directly  to the skin and wash . Gently wash with scrungie or clean wascloth    5.  Apply the CHG Soap to your body ONLY FROM THE NECK DOWN.   Do not use on open                           Wound or open sores. Avoid contact with eyes, ears mouth and genitals (private parts).                        Genitals (private parts) with your normal soap.              6.  Wash thoroughly, paying special attention to the area where your surgery  will  be performed.   7.  Thoroughly rinse your body with warm water from the neck down.   8.  DO NOT shower/wash with your normal soap after using and rinsing off  the CHG Soap .                9.  Pat yourself dry with a clean towel.             10.  Wear clean night clothes to bed after shower             11.  Place clean sheets on your bed the night of your first shower and do not  sleep with pets.  Day of Surgery : Do not apply any lotions/deodorants the morning of surgery.  Please wear clean clothes to the hospital/surgery center.  FAILURE TO FOLLOW THESE INSTRUCTIONS MAY RESULT IN THE CANCELLATION OF YOUR SURGERY    PATIENT SIGNATURE_________________________________  ______________________________________________________________________     Adam Phenix  An incentive spirometer is a tool that can help keep your lungs clear and active. This tool measures how well you are filling your lungs with each breath. Taking long deep breaths may help reverse or decrease the chance of developing breathing (pulmonary) problems (especially infection) following:  A long period of time when you are unable to move or be active. BEFORE THE PROCEDURE   If the spirometer includes an indicator to show your  best effort, your nurse or respiratory therapist will set it to a desired goal.  If possible, sit up straight or lean slightly forward. Try not to slouch.  Hold the incentive spirometer in an upright position. INSTRUCTIONS FOR USE  1. Sit on the edge of your bed if possible, or sit up as far as you can in bed or on a chair. 2. Hold the incentive spirometer in an upright position. 3. Breathe out normally. 4. Place the mouthpiece in your mouth and seal your lips tightly around it. 5. Breathe in slowly and as deeply as possible, raising the piston or the ball toward the top of the column. 6. Hold your breath for 3-5 seconds or for as long as possible. Allow the piston or ball to fall to the bottom of the column. 7. Remove the mouthpiece from your mouth and breathe out normally. 8. Rest for a few seconds and repeat Steps 1 through 7 at least 10 times every 1-2 hours when you are awake. Take your time and take a few normal breaths between deep breaths. 9. The spirometer may include an indicator to show your best effort. Use the indicator as a goal to work toward during each repetition. 10. After each set of 10 deep breaths, practice coughing to be sure your lungs are clear. If you have an incision (the cut made at the time of surgery), support your incision when coughing by placing a pillow or rolled up towels firmly against it. Once you are able to get out of bed, walk around indoors and cough well. You may stop using the incentive spirometer when instructed by your caregiver.  RISKS AND COMPLICATIONS  Take your time so you do not get dizzy or light-headed.  If you are in pain, you may need to take or ask for pain medication before doing incentive spirometry. It is harder to take a deep breath if you are having pain. AFTER USE  Rest and breathe slowly and easily.  It can be helpful to keep track of a log of your progress. Your  caregiver can provide you with a simple table to help with this. If  you are using the spirometer at home, follow these instructions: [facility] IF:   You are having difficultly using the spirometer.  You have trouble using the spirometer as often as instructed.  Your pain medication is not giving enough relief while using the spirometer.  You develop fever of 100.5 F (38.1 C) or higher. SEEK IMMEDIATE MEDICAL CARE IF:   You cough up bloody sputum that had not been present before.  You develop fever of 102 F (38.9 C) or greater.  You develop worsening pain at or near the incision site. MAKE SURE YOU:   Understand these instructions.  Will watch your condition.  Will get help right away if you are not doing well or get worse. Document Released: 08/20/2006 Document Revised: 07/02/2011 Document Reviewed: 10/21/2006 [facility] Patient Information 2014 [place], [place].   ________________________________________________________________________

## 2014-08-13 ENCOUNTER — Encounter (HOSPITAL_COMMUNITY)
Admission: RE | Admit: 2014-08-13 | Discharge: 2014-08-13 | Disposition: A | Discharge status: Home / Self Care | Payer: PRIVATE HEALTH INSURANCE | Source: Ambulatory Visit | Attending: Orthopedic Surgery | Admitting: Orthopedic Surgery

## 2014-08-13 ENCOUNTER — Encounter (HOSPITAL_COMMUNITY): Payer: Self-pay

## 2014-08-13 DIAGNOSIS — Z01812 Encounter for preprocedural laboratory examination: Secondary | ICD-10-CM | POA: Diagnosis not present

## 2014-08-13 HISTORY — DX: Headache, unspecified: R51.9

## 2014-08-13 HISTORY — DX: Sprain of other specified parts of unspecified knee, initial encounter: S83.8X9A

## 2014-08-13 HISTORY — DX: Unspecified maternal hypertension, unspecified trimester: O16.9

## 2014-08-13 HISTORY — DX: Other specified disease of esophagus: K22.89

## 2014-08-13 HISTORY — DX: Headache: R51

## 2014-08-13 HISTORY — DX: Other specified diseases of esophagus: K22.8

## 2014-08-13 LAB — CBC
HCT: 38.5 % (ref 36.0–46.0)
Hemoglobin: 12.2 g/dL (ref 12.0–15.0)
MCH: 26.5 pg (ref 26.0–34.0)
MCHC: 31.7 g/dL (ref 30.0–36.0)
MCV: 83.7 fL (ref 78.0–100.0)
Platelets: 350 10*3/uL (ref 150–400)
RBC: 4.6 MIL/uL (ref 3.87–5.11)
RDW: 14 % (ref 11.5–15.5)
WBC: 8.2 10*3/uL (ref 4.0–10.5)

## 2014-08-13 LAB — BASIC METABOLIC PANEL
Anion gap: 7 (ref 5–15)
BUN: 15 mg/dL (ref 6–23)
CO2: 29 mmol/L (ref 19–32)
Calcium: 9.2 mg/dL (ref 8.4–10.5)
Chloride: 104 mmol/L (ref 96–112)
Creatinine, Ser: 0.97 mg/dL (ref 0.50–1.10)
GFR calc Af Amer: 79 mL/min — ABNORMAL LOW (ref >90)
GFR calc non Af Amer: 68 mL/min — ABNORMAL LOW (ref >90)
Glucose, Bld: 97 mg/dL (ref 70–99)
Potassium: 3.7 mmol/L (ref 3.5–5.1)
Sodium: 140 mmol/L (ref 135–145)

## 2014-08-22 MED ORDER — DEXTROSE 5 % IV SOLN
3.0000 g | INTRAVENOUS | Status: AC
  Administered 2014-08-23: 3 g via INTRAVENOUS
  Filled 2014-08-22: qty 3000

## 2014-08-23 ENCOUNTER — Encounter (HOSPITAL_COMMUNITY): Payer: Self-pay | Admitting: *Deleted

## 2014-08-23 ENCOUNTER — Ambulatory Visit (HOSPITAL_COMMUNITY): Payer: PRIVATE HEALTH INSURANCE | Admitting: Registered Nurse

## 2014-08-23 ENCOUNTER — Encounter (HOSPITAL_COMMUNITY)
Admission: RE | Disposition: A | Discharge status: Home / Self Care | Payer: Self-pay | Source: Ambulatory Visit | Attending: Orthopedic Surgery

## 2014-08-23 ENCOUNTER — Ambulatory Visit (HOSPITAL_COMMUNITY)
Admission: RE | Admit: 2014-08-23 | Discharge: 2014-08-23 | Disposition: A | Discharge status: Home / Self Care | Payer: PRIVATE HEALTH INSURANCE | Source: Ambulatory Visit | Attending: Orthopedic Surgery | Admitting: Orthopedic Surgery

## 2014-08-23 DIAGNOSIS — M2242 Chondromalacia patellae, left knee: Secondary | ICD-10-CM | POA: Diagnosis not present

## 2014-08-23 DIAGNOSIS — K219 Gastro-esophageal reflux disease without esophagitis: Secondary | ICD-10-CM | POA: Insufficient documentation

## 2014-08-23 DIAGNOSIS — Z87440 Personal history of urinary (tract) infections: Secondary | ICD-10-CM | POA: Insufficient documentation

## 2014-08-23 DIAGNOSIS — I10 Essential (primary) hypertension: Secondary | ICD-10-CM | POA: Diagnosis not present

## 2014-08-23 DIAGNOSIS — M2392 Unspecified internal derangement of left knee: Secondary | ICD-10-CM

## 2014-08-23 DIAGNOSIS — X58XXXA Exposure to other specified factors, initial encounter: Secondary | ICD-10-CM | POA: Insufficient documentation

## 2014-08-23 DIAGNOSIS — S83282A Other tear of lateral meniscus, current injury, left knee, initial encounter: Secondary | ICD-10-CM | POA: Insufficient documentation

## 2014-08-23 DIAGNOSIS — Y9269 Other specified industrial and construction area as the place of occurrence of the external cause: Secondary | ICD-10-CM | POA: Diagnosis not present

## 2014-08-23 DIAGNOSIS — Z90710 Acquired absence of both cervix and uterus: Secondary | ICD-10-CM | POA: Insufficient documentation

## 2014-08-23 DIAGNOSIS — G43909 Migraine, unspecified, not intractable, without status migrainosus: Secondary | ICD-10-CM | POA: Diagnosis not present

## 2014-08-23 DIAGNOSIS — Z888 Allergy status to other drugs, medicaments and biological substances status: Secondary | ICD-10-CM | POA: Diagnosis not present

## 2014-08-23 DIAGNOSIS — Z887 Allergy status to serum and vaccine status: Secondary | ICD-10-CM | POA: Insufficient documentation

## 2014-08-23 DIAGNOSIS — S83242A Other tear of medial meniscus, current injury, left knee, initial encounter: Secondary | ICD-10-CM | POA: Insufficient documentation

## 2014-08-23 DIAGNOSIS — Y990 Civilian activity done for income or pay: Secondary | ICD-10-CM | POA: Diagnosis not present

## 2014-08-23 DIAGNOSIS — M94262 Chondromalacia, left knee: Secondary | ICD-10-CM | POA: Insufficient documentation

## 2014-08-23 DIAGNOSIS — Z6841 Body Mass Index (BMI) 40.0 and over, adult: Secondary | ICD-10-CM | POA: Diagnosis not present

## 2014-08-23 DIAGNOSIS — J45909 Unspecified asthma, uncomplicated: Secondary | ICD-10-CM | POA: Insufficient documentation

## 2014-08-23 HISTORY — PX: KNEE ARTHROSCOPY: SHX127

## 2014-08-23 SURGERY — ARTHROSCOPY, KNEE
Anesthesia: General | Site: Knee | Laterality: Left

## 2014-08-23 MED ORDER — ONDANSETRON HCL 4 MG/2ML IJ SOLN
INTRAMUSCULAR | Status: DC | PRN
  Administered 2014-08-23: 4 mg via INTRAVENOUS

## 2014-08-23 MED ORDER — FENTANYL CITRATE (PF) 250 MCG/5ML IJ SOLN
INTRAMUSCULAR | Status: AC
  Filled 2014-08-23: qty 5

## 2014-08-23 MED ORDER — PROPOFOL 10 MG/ML IV BOLUS
INTRAVENOUS | Status: AC
  Filled 2014-08-23: qty 20

## 2014-08-23 MED ORDER — LABETALOL HCL 5 MG/ML IV SOLN
INTRAVENOUS | Status: AC
  Filled 2014-08-23: qty 4

## 2014-08-23 MED ORDER — ALBUTEROL SULFATE (2.5 MG/3ML) 0.083% IN NEBU: 2.5000 mg | INHALATION_SOLUTION | Freq: Four times a day (QID) | RESPIRATORY_TRACT | Status: DC | PRN

## 2014-08-23 MED ORDER — ASPIRIN EC 325 MG PO TBEC: 325.0000 mg | DELAYED_RELEASE_TABLET | Freq: Every day | ORAL | Status: AC

## 2014-08-23 MED ORDER — FENTANYL CITRATE (PF) 100 MCG/2ML IJ SOLN
25.0000 ug | INTRAMUSCULAR | Status: DC | PRN
  Administered 2014-08-23 (×3): 50 ug via INTRAVENOUS

## 2014-08-23 MED ORDER — CYCLOBENZAPRINE HCL 5 MG PO TABS: 5.0000 mg | ORAL_TABLET | Freq: Three times a day (TID) | ORAL | Status: DC | PRN

## 2014-08-23 MED ORDER — LIDOCAINE HCL (CARDIAC) 20 MG/ML IV SOLN
INTRAVENOUS | Status: AC
  Filled 2014-08-23: qty 5

## 2014-08-23 MED ORDER — LACTATED RINGERS IV SOLN
INTRAVENOUS | Status: DC | PRN
  Administered 2014-08-23: 07:00:00 via INTRAVENOUS

## 2014-08-23 MED ORDER — KETOROLAC TROMETHAMINE 30 MG/ML IJ SOLN
INTRAMUSCULAR | Status: DC | PRN
  Administered 2014-08-23: 30 mg via INTRAVENOUS

## 2014-08-23 MED ORDER — SODIUM CHLORIDE 0.9 % IJ SOLN
INTRAMUSCULAR | Status: AC
  Filled 2014-08-23: qty 10

## 2014-08-23 MED ORDER — OXYCODONE HCL 5 MG PO TABS: 5.0000 mg | ORAL_TABLET | ORAL | Status: DC | PRN

## 2014-08-23 MED ORDER — ACETAMINOPHEN 10 MG/ML IV SOLN
1000.0000 mg | Freq: Once | INTRAVENOUS | Status: AC
  Administered 2014-08-23: 1000 mg via INTRAVENOUS
  Filled 2014-08-23: qty 100

## 2014-08-23 MED ORDER — MIDAZOLAM HCL 2 MG/2ML IJ SOLN
0.5000 mg | Freq: Once | INTRAMUSCULAR | Status: AC | PRN
  Administered 2014-08-23: 0.5 mg via INTRAVENOUS

## 2014-08-23 MED ORDER — PROPOFOL 10 MG/ML IV BOLUS
INTRAVENOUS | Status: DC | PRN
  Administered 2014-08-23: 250 mg via INTRAVENOUS

## 2014-08-23 MED ORDER — CHLORHEXIDINE GLUCONATE 4 % EX LIQD: 60.0000 mL | Freq: Once | CUTANEOUS | Status: DC

## 2014-08-23 MED ORDER — BUPIVACAINE-EPINEPHRINE (PF) 0.25% -1:200000 IJ SOLN
INTRAMUSCULAR | Status: AC
  Filled 2014-08-23: qty 30

## 2014-08-23 MED ORDER — FENTANYL CITRATE (PF) 100 MCG/2ML IJ SOLN
INTRAMUSCULAR | Status: AC
  Filled 2014-08-23: qty 4

## 2014-08-23 MED ORDER — ROPIVACAINE HCL 5 MG/ML IJ SOLN
INTRAMUSCULAR | Status: AC
  Filled 2014-08-23: qty 30

## 2014-08-23 MED ORDER — LABETALOL HCL 5 MG/ML IV SOLN
INTRAVENOUS | Status: DC | PRN
  Administered 2014-08-23: 2.5 mg via INTRAVENOUS

## 2014-08-23 MED ORDER — MIDAZOLAM HCL 2 MG/2ML IJ SOLN
INTRAMUSCULAR | Status: AC
  Filled 2014-08-23: qty 2

## 2014-08-23 MED ORDER — BUPIVACAINE-EPINEPHRINE 0.25% -1:200000 IJ SOLN
INTRAMUSCULAR | Status: DC | PRN
  Administered 2014-08-23: 30 mL

## 2014-08-23 MED ORDER — EPHEDRINE SULFATE 50 MG/ML IJ SOLN
INTRAMUSCULAR | Status: AC
  Filled 2014-08-23: qty 1

## 2014-08-23 MED ORDER — GLYCOPYRROLATE 0.2 MG/ML IJ SOLN
INTRAMUSCULAR | Status: DC | PRN
  Administered 2014-08-23: 0.6 mg via INTRAVENOUS

## 2014-08-23 MED ORDER — GLYCOPYRROLATE 0.2 MG/ML IJ SOLN
INTRAMUSCULAR | Status: AC
  Filled 2014-08-23: qty 3

## 2014-08-23 MED ORDER — LIDOCAINE HCL (CARDIAC) 20 MG/ML IV SOLN
INTRAVENOUS | Status: DC | PRN
  Administered 2014-08-23: 100 mg via INTRAVENOUS

## 2014-08-23 MED ORDER — ONDANSETRON HCL 4 MG/2ML IJ SOLN
INTRAMUSCULAR | Status: AC
  Filled 2014-08-23: qty 2

## 2014-08-23 MED ORDER — FENTANYL CITRATE (PF) 100 MCG/2ML IJ SOLN
INTRAMUSCULAR | Status: DC | PRN
  Administered 2014-08-23 (×5): 50 ug via INTRAVENOUS

## 2014-08-23 MED ORDER — NEOSTIGMINE METHYLSULFATE 10 MG/10ML IV SOLN
INTRAVENOUS | Status: DC | PRN
  Administered 2014-08-23: 4 mg via INTRAVENOUS

## 2014-08-23 MED ORDER — SUCCINYLCHOLINE CHLORIDE 20 MG/ML IJ SOLN
INTRAMUSCULAR | Status: DC | PRN
  Administered 2014-08-23: 100 mg via INTRAVENOUS

## 2014-08-23 MED ORDER — MIDAZOLAM HCL 5 MG/5ML IJ SOLN
INTRAMUSCULAR | Status: DC | PRN
  Administered 2014-08-23 (×2): 1 mg via INTRAVENOUS

## 2014-08-23 MED ORDER — LACTATED RINGERS IR SOLN
Status: DC | PRN
  Administered 2014-08-23: 6000 mL

## 2014-08-23 MED ORDER — MELOXICAM 15 MG PO TABS: 15.0000 mg | ORAL_TABLET | Freq: Every day | ORAL | Status: DC

## 2014-08-23 MED ORDER — ROCURONIUM BROMIDE 100 MG/10ML IV SOLN
INTRAVENOUS | Status: DC | PRN
  Administered 2014-08-23: 20 mg via INTRAVENOUS

## 2014-08-23 MED ORDER — ALBUTEROL SULFATE (2.5 MG/3ML) 0.083% IN NEBU
INHALATION_SOLUTION | RESPIRATORY_TRACT | Status: AC
  Filled 2014-08-23: qty 3

## 2014-08-23 MED ORDER — LIDOCAINE-EPINEPHRINE 1 %-1:100000 IJ SOLN
INTRAMUSCULAR | Status: AC
  Filled 2014-08-23: qty 1

## 2014-08-23 MED ORDER — DEXAMETHASONE SODIUM PHOSPHATE 10 MG/ML IJ SOLN
INTRAMUSCULAR | Status: AC
  Filled 2014-08-23: qty 1

## 2014-08-23 MED ORDER — DEXAMETHASONE SODIUM PHOSPHATE 10 MG/ML IJ SOLN
INTRAMUSCULAR | Status: DC | PRN
  Administered 2014-08-23: 10 mg via INTRAVENOUS

## 2014-08-23 MED ORDER — KETOROLAC TROMETHAMINE 30 MG/ML IJ SOLN
INTRAMUSCULAR | Status: AC
  Filled 2014-08-23: qty 1

## 2014-08-23 SURGICAL SUPPLY — 32 items
BANDAGE ELASTIC 6 VELCRO ST LF (GAUZE/BANDAGES/DRESSINGS) ×1 IMPLANT
BLADE CUDA SHAVER 3.5 (BLADE) ×2 IMPLANT
BLADE SURG SZ11 CARB STEEL (BLADE) IMPLANT
CLOTH BEACON ORANGE TIMEOUT ST (SAFETY) ×2 IMPLANT
COUNTER NEEDLE 20 DBL MAG RED (NEEDLE) ×2 IMPLANT
COVER SURGICAL LIGHT HANDLE (MISCELLANEOUS) ×2 IMPLANT
CUFF TOURN SGL QUICK 44 (TOURNIQUET CUFF) ×1 IMPLANT
DRAPE LEGGING IMPERMEABLE (DRAPES) ×1 IMPLANT
DRAPE U-SHAPE 47X51 STRL (DRAPES) ×1 IMPLANT
DRSG EMULSION OIL 3X3 NADH (GAUZE/BANDAGES/DRESSINGS) ×2 IMPLANT
DRSG PAD ABDOMINAL 8X10 ST (GAUZE/BANDAGES/DRESSINGS) ×2 IMPLANT
DURAPREP 26ML APPLICATOR (WOUND CARE) ×2 IMPLANT
GAUZE SPONGE 4X4 12PLY STRL (GAUZE/BANDAGES/DRESSINGS) ×1 IMPLANT
GAUZE SPONGE 4X4 16PLY XRAY LF (GAUZE/BANDAGES/DRESSINGS) ×2 IMPLANT
GLOVE BIOGEL PI IND STRL 7.5 (GLOVE) ×1 IMPLANT
GLOVE BIOGEL PI INDICATOR 7.5 (GLOVE) ×1
GLOVE ORTHO TXT STRL SZ7.5 (GLOVE) ×2 IMPLANT
GOWN STRL REUS W/TWL LRG LVL3 (GOWN DISPOSABLE) ×2 IMPLANT
KIT BASIN OR (CUSTOM PROCEDURE TRAY) ×2 IMPLANT
MANIFOLD NEPTUNE II (INSTRUMENTS) ×2 IMPLANT
MARKER PEN SURG W/LABELS BLK (STERILIZATION PRODUCTS) ×2 IMPLANT
PACK ARTHROSCOPY WL (CUSTOM PROCEDURE TRAY) ×2 IMPLANT
PAD MASON LEG HOLDER (PIN) ×1 IMPLANT
PADDING CAST COTTON 6X4 STRL (CAST SUPPLIES) ×2 IMPLANT
SET ARTHROSCOPY TUBING (MISCELLANEOUS) ×2
SET ARTHROSCOPY TUBING LN (MISCELLANEOUS) ×1 IMPLANT
SUT ETHILON 4 0 PS 2 18 (SUTURE) ×2 IMPLANT
SUT VIC AB 2-0 CT1 27 (SUTURE) ×2
SUT VIC AB 2-0 CT1 TAPERPNT 27 (SUTURE) IMPLANT
SYR 30ML LL (SYRINGE) ×2 IMPLANT
TOWEL OR 17X26 10 PK STRL BLUE (TOWEL DISPOSABLE) ×2 IMPLANT
WRAP KNEE MAXI GEL POST OP (GAUZE/BANDAGES/DRESSINGS) ×2 IMPLANT

## 2014-08-23 NOTE — Anesthesia Procedure Notes (Signed)
Procedure Name: Intubation Date/Time: 08/23/2014 7:45 AM Performed by: Carleene Cooper A Pre-anesthesia Checklist: Patient identified, Timeout performed, Emergency Drugs available, Suction available and Patient being monitored Patient Re-evaluated:Patient Re-evaluated prior to inductionOxygen Delivery Method: Circle system utilized Preoxygenation: Pre-oxygenation with 100% oxygen Intubation Type: Inhalational induction with existing ETT Ventilation: Mask ventilation without difficulty Laryngoscope Size: Mac and 4 Grade View: Grade II Tube type: Oral Tube size: 7.5 mm Number of attempts: 2 Airway Equipment and Method: Stylet Placement Confirmation: ETT inserted through vocal cords under direct vision,  breath sounds checked- equal and bilateral and positive ETCO2 Secured at: 22 cm Tube secured with: Tape Dental Injury: Teeth and Oropharynx as per pre-operative assessment

## 2014-08-23 NOTE — Brief Op Note (Signed)
08/23/2014  8:45 AM  PATIENT:  [name]  49 y.o. female  PRE-OPERATIVE DIAGNOSIS:  left knee medial meniscal tear  POST-OPERATIVE DIAGNOSIS:  1.  left knee medial meniscal tear, 2.  Left knee lateral meniscal tear, 3. 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 medial and lateral partial meniscectomies, 2.  medial and patello-femoral chondroplasty, debridement of chondral  flaps (Left)  SURGEON:  Surgeon(s) and Role:    * Paralee Cancel, MD - Primary  PHYSICIAN ASSISTANT: None  ASSISTANTS: none   ANESTHESIA:   local and general  EBL:  Total I/O In: 1000 [I.V.:1000] Out: -   BLOOD ADMINISTERED:none  DRAINS: none   LOCAL MEDICATIONS USED:  MARCAINE     SPECIMEN:  No Specimen  DISPOSITION OF SPECIMEN:  N/A  COUNTS:  YES  TOURNIQUET:  * No tourniquets in log *  DICTATION: .Other Dictation: Dictation Number [id number]  PLAN OF CARE: Discharge to home after PACU  PATIENT DISPOSITION:  PACU - hemodynamically stable.   Delay start of Pharmacological VTE agent (>24hrs) due to surgical blood loss or risk of bleeding: no

## 2014-08-23 NOTE — Transfer of Care (Signed)
Immediate Anesthesia Transfer of Care Note  Patient: [name]  Procedure(s) Performed: Procedure(s): ARTHROSCOPY LEFT KNEE WITH DEBRIDEMENT, medial and lateral menisctomy, medial and lateral patella chondraplasty (Left)  Patient Location: PACU  Anesthesia Type:General  Level of Consciousness: awake, alert  and oriented  Airway & Oxygen Therapy: Patient Spontanous Breathing, Patient connected to face mask oxygen and patient anxious, c/o SOB.   Dr. Delma Post at bedside - patient able to take a deep breath - Ventolin treatment ordered.  Post-op Assessment: Report given to RN and Post -op Vital signs reviewed and stable  Post vital signs: Reviewed and stable  Last Vitals: There were no vitals filed for this visit.  Complications: No apparent anesthesia complications
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
## 2014-08-23 NOTE — Op Note (Signed)
NAMENOHA, MILBERGER NO.:  [id number]  MEDICAL RECORD NO.:  [id number]  LOCATION:  [facility]                         FACILITY:  [facility]  PHYSICIAN:  Pietro Cassis. Alvan Dame, M.D.  DATE OF BIRTH:  [birth year]  DATE OF PROCEDURE:  08/23/2014 DATE OF DISCHARGE:                              OPERATIVE REPORT   PREOPERATIVE DIAGNOSIS:  Left knee medial meniscal tear, associated underlying degenerative changes.  POSTOPERATIVE DIAGNOSIS/FINDINGS: 1. Complex tear into the posterior horn and medial meniscus. 2. Complex tearing to the central portion of the anterior and midbody     of the lateral meniscus. 3. Grade 2-3 chondromalacia in the medial and patellofemoral     compartments.  Patellofemoral compartment was predominantly of the     lateral facet of the patella.  No significant trochlear changes.  PROCEDURE: 1. The left knee diagnostic and operative arthroscopy with medial and     lateral partial meniscectomies. 2. Medial and patellofemoral chondroplasty debridement of chondral     flaps, then microfracture abrasion, chondroplasty required.  SURGEON:  Pietro Cassis. Alvan Dame, MD  ASSISTANT:  Surgical team.  ANESTHESIA:  General LMA plus a postprocedural Marcaine injection in the knee.  COMPLICATIONS:  None.  DRAINS:  None.  TOURNIQUET:  None.  BLOOD LOSS:  Minimal.  INDICATIONS FOR PROCEDURE:  [name] is a 49 year old female with a history of left knee injury that has aggravated work related duties. She had been seen and evaluated in the office and worked through. Attempted conservative measures including activity modification, work restrictions, strengthening with therapy, injection therapy without any significant benefit, persistent recurring symptoms ultimately led to MRI evaluation.  MRI confirmed concerns for findings on her exam of the medial joint line tenderness, the medial meniscal pathology.  [name] had a history of a right knee arthroscopy and thus  understood that she had failed conservative measures and based on MRI findings, was ready to proceed with the arthroscopic surgery.  Risks, benefits of recurrent meniscal pathology, infection, DVT were also discussed, reviewed, postop physical therapy would be imperative to maximize the range of motion strength.  A consent was obtained for benefit of pain relief.  PROCEDURE IN DETAIL:  The patient was brought to the operative theater. Once adequate anesthesia, preoperative antibiotics, Ancef, 3 g administered, she was positioned supine.  A thigh tourniquet had been placed.  Her right leg based on the size of the lower extremities was placed into a holding stir up.  We used the traction, lateral leg post through this procedure.  The left lower extremity was prepped and draped in a sterile fashion.  A time-out was performed identifying the patient, planned procedure, and extremity.  A standard inferomedial, superomedial, and inferolateral portals were utilized.  Diagnostic evaluation of knee revealed the above findings.  The inferomedial portal was initially utilized for medial compartment debridement.  Following examination of the medial compartment, I inserted a 3.5 Cuda shaver and was able to debride the vast majority of the meniscus.  I then inserted a straight-biting basket to further trim posterior horn meniscal fragments followed by re-insertion of the 3.5 Cuda shaver to remove the meniscal fragments in blunt.  Remaining meniscus back to stable  level.  This was basically back to the central third of the meniscus. Remaining meniscus was intact and stable with no evidence of any further unstable tears.  Furthermore in the medial compartment, I used a 3.5 Cuda shaver for chondroplasty debridement, debriding unstable flaps of cartilage along the distal lateral aspect, medial femoral condyle. There was no evidence of eburnated bone, thus no operation on microfracture  indicated. Laterally, I examined the knee, found an intact anterior cruciate ligament with some diminutive fibers.  The lateral compartment was noted to have meniscal pathology as well in the anterior and midbody.  I was able to use the inferomedial portal to address all this as well.  The remaining cartilage was intact laterally. Following this, meniscectomy laterally and medially attended anteriorly with the findings noted above, grade 2 chondromalacia of the patella. Patella involving the lateral facet with an intact trochlea.  After initially debriding through the inferomedial portal, I did switch portals and used the lateral portal to address this lateral facet of the patella.  Once I was satisfied with this debridement, I reinserted the camera to the inferolateral portal for further and followup evaluation.  Her synovium was noted to be fairly hyperemic and significant synovectomy was carried out to prevent any intra-articular bleed in the postoperative period.  Following the above-stated procedures, I re-examined the knee to make certain that all cartilage surface remained stable.  There was no further procedure-based activity to be done.  Once this was completed, the instrumentation was removed from the knee.  Portal sites were reapproximated with 4-0 nylon.  The knee was injected at the end of the case with 0.25% Marcaine with epinephrine.  The knee was then cleaned, dried and dressed sterilely with a bulky wrap.  She was brought to the recovery room in stable condition.  She will be discharged to home today.  She will work on getting physical therapy set up probably around the time of her next first postop visit within 10-12 days.  She will work on strengthening her lower extremity as well as stretching.  She had problems recovering from the right knee.  She is aware of the challenges she addressed, she will hopefully bypass this, all that this time.     Pietro Cassis  Alvan Dame, M.D.    MDO/MEDQ  D:  08/23/2014  T:  08/23/2014  Job:  [id number]

## 2014-08-23 NOTE — Progress Notes (Signed)
Crutch walking instructions and crutches given to patient by ortho tech

## 2014-08-23 NOTE — Anesthesia Postprocedure Evaluation (Signed)
  Anesthesia Post-op Note  Patient: [name]  Procedure(s) Performed: Procedure(s) (LRB): ARTHROSCOPY LEFT KNEE WITH DEBRIDEMENT, medial and lateral menisctomy, medial and lateral patella chondraplasty (Left)  Patient Location: PACU  Anesthesia Type: General  Level of Consciousness: awake and alert   Airway and Oxygen Therapy: Patient Spontanous Breathing  Post-op Pain: mild  Post-op Assessment: Post-op Vital signs reviewed, Patient's Cardiovascular Status Stable, Respiratory Function Stable, Patent Airway and No signs of Nausea or vomiting  Last Vitals:  Filed Vitals:   08/23/14 0955  BP:   Pulse: 65  Temp:   Resp: 13    Post-op Vital Signs: stable   Complications: No apparent anesthesia complications

## 2014-08-23 NOTE — Anesthesia Preprocedure Evaluation (Addendum)
Anesthesia Evaluation  Patient identified by MRN, date of birth, ID band Patient awake    Reviewed: Allergy & Precautions, [facility]PO status , Patient's Chart, lab work & pertinent test results  Airway Mallampati: II  TM Distance: >3 FB Neck ROM: Full    Dental no notable dental hx.    Pulmonary asthma ,  breath sounds clear to auscultation  Pulmonary exam normal       Cardiovascular hypertension, Rhythm:Regular Rate:Normal     Neuro/Psych  Headaches,  Neuromuscular disease negative psych ROS   GI/Hepatic Neg liver ROS, GERD-  Poorly Controlled,Didn't take protonix today.   Endo/Other  Morbid obesity  Renal/GU negative Renal ROS  negative genitourinary   Musculoskeletal negative musculoskeletal ROS (+)   Abdominal (+) + obese,   Peds negative pediatric ROS (+)  Hematology negative hematology ROS (+)   Anesthesia Other Findings   Reproductive/Obstetrics negative OB ROS                           Anesthesia Physical Anesthesia Plan  ASA: III  Anesthesia Plan: General   Post-op Pain Management:    Induction: Intravenous  Airway Management Planned: Oral ETT  Additional Equipment:   Intra-op Plan:   Post-operative Plan: Extubation in OR  Informed Consent: I have reviewed the patients History and Physical, chart, labs and discussed the procedure including the risks, benefits and alternatives for the proposed anesthesia with the patient or authorized representative who has indicated his/her understanding and acceptance.   Dental advisory given  Plan Discussed with: CRNA  Anesthesia Plan Comments:        Anesthesia Quick Evaluation

## 2014-08-24 ENCOUNTER — Encounter (HOSPITAL_COMMUNITY): Payer: Self-pay | Admitting: Orthopedic Surgery

## 2014-09-13 ENCOUNTER — Other Ambulatory Visit: Payer: Self-pay | Admitting: Internal Medicine

## 2014-09-13 NOTE — Telephone Encounter (Signed)
Ok to refill each x 3

## 2014-09-13 NOTE — Telephone Encounter (Signed)
CY Please advise on refill for Lasix. Thanks.

## 2014-09-14 ENCOUNTER — Ambulatory Visit (HOSPITAL_COMMUNITY)
Admission: RE | Admit: 2014-09-14 | Discharge: 2014-09-14 | Disposition: A | Discharge status: Home / Self Care | Payer: 59 | Source: Ambulatory Visit | Attending: Orthopedic Surgery | Admitting: Orthopedic Surgery

## 2014-09-14 ENCOUNTER — Other Ambulatory Visit (HOSPITAL_COMMUNITY): Payer: Self-pay | Admitting: Orthopedic Surgery

## 2014-09-14 DIAGNOSIS — M79605 Pain in left leg: Secondary | ICD-10-CM | POA: Diagnosis present

## 2014-09-14 DIAGNOSIS — M7989 Other specified soft tissue disorders: Secondary | ICD-10-CM | POA: Insufficient documentation

## 2014-09-14 DIAGNOSIS — R609 Edema, unspecified: Secondary | ICD-10-CM

## 2014-09-14 NOTE — Progress Notes (Signed)
VASCULAR LAB PRELIMINARY  PRELIMINARY  PRELIMINARY  PRELIMINARY  Left lower extremity venous duplex completed.    Preliminary report:  Left:  No evidence of DVT, superficial thrombosis, or Baker's cyst.  Denette Hass, RVS 09/14/2014, 4:38 PM

## 2014-09-24 ENCOUNTER — Other Ambulatory Visit: Payer: Self-pay | Admitting: Internal Medicine

## 2014-09-27 NOTE — Telephone Encounter (Signed)
Ok to refill 

## 2014-09-27 NOTE — Telephone Encounter (Signed)
CY Please advise on refill. Thanks.  

## 2014-09-29 NOTE — Telephone Encounter (Signed)
Called to pharmacy 

## 2014-10-04 ENCOUNTER — Other Ambulatory Visit: Payer: Self-pay | Admitting: Internal Medicine

## 2014-10-04 NOTE — Telephone Encounter (Signed)
Ok to refill both x 6 months

## 2014-10-04 NOTE — Telephone Encounter (Signed)
CY - please advise on refills. Thanks.

## 2014-10-07 MED ORDER — FUROSEMIDE 40 MG PO TABS: 40.0000 mg | ORAL_TABLET | Freq: Every day | ORAL | Status: DC | PRN

## 2014-10-07 MED ORDER — TRAMADOL HCL 50 MG PO TABS: 50.0000 mg | ORAL_TABLET | ORAL | Status: DC | PRN

## 2014-10-07 NOTE — Addendum Note (Signed)
Addended by: Lorane Gell on: 10/07/2014 02:31 PM   Modules accepted: Orders

## 2014-10-07 NOTE — Telephone Encounter (Signed)
Called and left refill auth on VM at pharmacy.

## 2014-10-20 ENCOUNTER — Other Ambulatory Visit: Payer: Self-pay | Admitting: Internal Medicine

## 2014-10-20 NOTE — Telephone Encounter (Deleted)
Ok to refill 

## 2014-10-20 NOTE — Telephone Encounter (Signed)
Please advise if okay to refill lorazepam 1 mg  Last filled 01/04/14 #30 with 5 rf  Last ov 07/02/14 Next ov 10/28/14

## 2014-10-28 ENCOUNTER — Ambulatory Visit: Payer: Self-pay | Admitting: Internal Medicine

## 2014-11-04 ENCOUNTER — Telehealth: Payer: Self-pay | Admitting: Internal Medicine

## 2014-11-04 NOTE — Telephone Encounter (Signed)
Pt can come in to see TP or be seen by CY on Monday 11-15-14 at 4:15pm or Thursday 11-18-14 at 4:15pm. Thanks.

## 2014-11-04 NOTE — Telephone Encounter (Signed)
Called pt and appt scheduled for 7/25 at 4:15. Nothing further needed

## 2014-11-04 NOTE — Telephone Encounter (Signed)
Spoke w/ pt. She missed her appt on 7/7 d/t mom being in hospital. She reports she can't wait to be seen at next available and wants to be worked in. Please advise Joellen Jersey thanks

## 2014-11-15 ENCOUNTER — Ambulatory Visit (INDEPENDENT_AMBULATORY_CARE_PROVIDER_SITE_OTHER): Payer: 59 | Admitting: Internal Medicine

## 2014-11-15 ENCOUNTER — Ambulatory Visit: Payer: 59 | Admitting: Internal Medicine

## 2014-11-15 ENCOUNTER — Encounter: Payer: Self-pay | Admitting: Internal Medicine

## 2014-11-15 VITALS — BP 144/84 | HR 98

## 2014-11-15 DIAGNOSIS — K219 Gastro-esophageal reflux disease without esophagitis: Secondary | ICD-10-CM

## 2014-11-15 DIAGNOSIS — J4540 Moderate persistent asthma, uncomplicated: Secondary | ICD-10-CM | POA: Diagnosis not present
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
## 2014-11-15 NOTE — Patient Instructions (Addendum)
We can continue present meds  Please call as needed  Consider Nucala

## 2015-01-09 ENCOUNTER — Encounter: Payer: Self-pay | Admitting: Internal Medicine

## 2015-01-09 DIAGNOSIS — Z1239 Encounter for other screening for malignant neoplasm of breast: Secondary | ICD-10-CM

## 2015-01-10 NOTE — Telephone Encounter (Signed)
CY please advise if these medications can be refilled. Thanks!

## 2015-01-10 NOTE — Telephone Encounter (Signed)
Ok to refill all meds as requested, including TransDerm Scop patches, and the Tussionex 230 ml, as before

## 2015-01-10 NOTE — Addendum Note (Signed)
Addended by: Desmond Dike C on: 01/10/2015 02:39 PM   Modules accepted: Orders, Medications

## 2015-02-06 NOTE — Assessment & Plan Note (Signed)
Moderate persistent asthma aggravated by obesity. Consider if she would be a candidate for [facility]

## 2015-02-06 NOTE — Assessment & Plan Note (Signed)
Emphasized need to be careful with reflux precautions because of potential to trigger her cough and asthma

## 2015-03-15 ENCOUNTER — Encounter: Payer: Self-pay | Admitting: Gastroenterology

## 2015-03-15 ENCOUNTER — Ambulatory Visit (INDEPENDENT_AMBULATORY_CARE_PROVIDER_SITE_OTHER): Payer: 59 | Admitting: Gastroenterology

## 2015-03-15 VITALS — BP 152/88 | HR 80 | Ht 63.0 in | Wt 271.4 lb

## 2015-03-15 DIAGNOSIS — R1319 Other dysphagia: Secondary | ICD-10-CM

## 2015-03-15 DIAGNOSIS — K625 Hemorrhage of anus and rectum: Secondary | ICD-10-CM

## 2015-03-15 MED ORDER — NA SULFATE-K SULFATE-MG SULF 17.5-3.13-1.6 GM/177ML PO SOLN: 1.0000 | Freq: Once | ORAL | Status: DC
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
## 2015-03-15 NOTE — Patient Instructions (Signed)
You will be set up for a colonoscopy for rectal bleeding. You will be set up for an upper endoscopy with dilation for dysphagia.

## 2015-04-19 ENCOUNTER — Ambulatory Visit (AMBULATORY_SURGERY_CENTER): Payer: 59 | Admitting: Gastroenterology

## 2015-04-19 ENCOUNTER — Encounter: Payer: Self-pay | Admitting: Gastroenterology

## 2015-04-19 VITALS — BP 157/85 | HR 70 | Temp 99.1°F | Resp 44 | Ht 63.0 in | Wt 271.0 lb

## 2015-04-19 DIAGNOSIS — K649 Unspecified hemorrhoids: Secondary | ICD-10-CM | POA: Diagnosis not present

## 2015-04-19 DIAGNOSIS — R1319 Other dysphagia: Secondary | ICD-10-CM

## 2015-04-19 DIAGNOSIS — K625 Hemorrhage of anus and rectum: Secondary | ICD-10-CM

## 2015-04-19 MED ORDER — SODIUM CHLORIDE 0.9 % IV SOLN: 500.0000 mL | INTRAVENOUS | Status: DC

## 2015-04-19 NOTE — Patient Instructions (Signed)

## 2015-04-19 NOTE — Op Note (Signed)
[place]  [address]. [place], [id number]   ENDOSCOPY PROCEDURE REPORT  PATIENT: [name], [name]  MR#: [id number] BIRTHDATE: 1965/11/17 , 11  yrs. old GENDER: female ENDOSCOPIST: Milus Banister, MD PROCEDURE DATE:  04/19/2015 PROCEDURE:  EGD w/ balloon dilation ASA CLASS:     Class II INDICATIONS:  smooth, distal esophageal narrowing.  EGD Dr.  Ardis Hughs August 2009, dilated to 19 mm.  This resulted in significant but incomplete improvement in symptoms.  Repeat EGD Dr.  Ardis Hughs October 2009 dilated the smooth narrowing up to 20 mm; dysphagia again 06/2011 led to another, found "minor narrowing" at [place] which was dilated up to 65mm; EGD Dr.  Ardis Hughs 03/2012 found mild gastritis, otherwise normal; recurrent dysphagia now. MEDICATIONS: Monitored anesthesia care, Residual sedation present, and Propofol 80 mg IV TOPICAL ANESTHETIC: none  DESCRIPTION OF PROCEDURE: After the risks benefits and alternatives of the procedure were thoroughly explained, informed consent was obtained.  The LB [id number] endoscope was introduced through the mouth and advanced to the second portion of the duodenum , Without limitations.  The instrument was slowly withdrawn as the mucosa was fully examined.   The GE junction was slightly narrowed, but smooth.  I was able to easily advance the adult gastroscope into the stomach.  The UGI was otherwise normal.  The GE junction was dilated with TTS balloon held inflated for 60 seconds at 23mm.  Retroflexed views revealed no abnormalities.     The scope was then withdrawn from the patient and the procedure completed.  COMPLICATIONS: There were no immediate complications.  ENDOSCOPIC IMPRESSION: The GE junction was slightly narrowed, but smooth.  I was able to easily advance the adult gastroscope into the stomach.  The UGI was otherwise normal.  The GE junction was dilated with TTS balloon held inflated for 60 seconds at  64mm  RECOMMENDATIONS: Return as needed  eSigned:  Milus Banister, MD 04/19/2015 3:21 PM

## 2015-04-19 NOTE — Progress Notes (Signed)
Report to PACU, RN, vss, BBS= Clear.  

## 2015-04-19 NOTE — Op Note (Signed)
[place]  [address] [employer]. [place], [id number]   COLONOSCOPY PROCEDURE REPORT  PATIENT: [name], [name]  MR#: [id number] BIRTHDATE: 23-Jan-1966 , 77  yrs. old GENDER: female ENDOSCOPIST: Milus Banister, MD PROCEDURE DATE:  04/19/2015 PROCEDURE:   Colonoscopy, diagnostic First Screening Colonoscopy - Avg.  risk and is 50 yrs.  old or older - No.  Prior Negative Screening - Now for repeat screening. N/A  History of Adenoma - Now for follow-up colonoscopy & has been > or = to 3 yrs.  N/A  Recommend repeat exam, <10 yrs? No ASA CLASS:   Class II INDICATIONS:minor rectal bleeding. MEDICATIONS: Monitored anesthesia care and Propofol 250 mg IV  DESCRIPTION OF PROCEDURE:   After the risks benefits and alternatives of the procedure were thoroughly explained, informed consent was obtained.  The digital rectal exam revealed no abnormalities of the rectum.   The LB [id number]  endoscope was introduced through the anus and advanced to the cecum, which was identified by both the appendix and ileocecal valve. No adverse events experienced.   The quality of the prep was excellent.  The instrument was then slowly withdrawn as the colon was fully examined. Estimated blood loss is zero unless otherwise noted in this procedure report.   COLON FINDINGS: Small internal hemorrhoids were found.   The examination was otherwise normal.  Retroflexed views revealed no abnormalities. The time to cecum = 4.2 Withdrawal time = 6.1   The scope was withdrawn and the procedure completed. COMPLICATIONS: There were no immediate complications.  ENDOSCOPIC IMPRESSION: 1.   Small internal hemorrhoids 2.   The examination was otherwise normal  RECOMMENDATIONS: You should continue to follow colorectal cancer screening guidelines for "routine risk" patients with a repeat colonoscopy in 10 years.   eSigned:  Milus Banister, MD 04/19/2015 3:10 PM   c

## 2015-04-19 NOTE — Progress Notes (Signed)
Called to room to assist during endoscopic procedure.  Patient ID and intended procedure confirmed with present staff. Received instructions for my participation in the procedure from the performing physician.  

## 2015-04-20 ENCOUNTER — Telehealth: Payer: Self-pay | Admitting: *Deleted

## 2015-04-20 NOTE — Telephone Encounter (Signed)
  Follow up Call-  Call back number 04/19/2015  Post procedure Call Back phone  # [phone]  Permission to leave phone message Yes     Patient questions:  Do you have a fever, pain , or abdominal swelling? No. Pain Score  0 *  Have you tolerated food without any problems? Yes.    Have you been able to return to your normal activities? Yes.    Do you have any questions about your discharge instructions: Diet   No. Medications  No. Follow up visit  No.  Do you have questions or concerns about your Care? No.  Actions: * If pain score is 4 or above: No action needed, pain <4.

## 2015-04-26 ENCOUNTER — Telehealth: Payer: Self-pay | Admitting: Internal Medicine

## 2015-04-26 DIAGNOSIS — J4540 Moderate persistent asthma, uncomplicated: Secondary | ICD-10-CM

## 2015-04-26 MED ORDER — PREDNISONE 10 MG PO TABS: ORAL_TABLET | ORAL | Status: DC

## 2015-04-26 MED ORDER — AZITHROMYCIN 250 MG PO TABS: ORAL_TABLET | ORAL | Status: AC

## 2015-04-26 MED FILL — AZITHROMYCIN 250 MG TABLET: 250 | 5 days supply | Qty: 6 | Fill #0

## 2015-04-26 MED FILL — predniSONE 10 MG TABS: 10 | 8 days supply | Qty: 20 | Fill #0

## 2015-04-26 NOTE — Telephone Encounter (Signed)
Offer Zpak Offer prednisone 10 mg, # 20, 4 X 2 DAYS, 3 X 2 DAYS, 2 X 2 DAYS, 1 X 2 DAYS Stay well hydrated

## 2015-04-26 NOTE — Telephone Encounter (Signed)
Spoke with pt. Reports increased coughing, chest tightness. Peak Flow is at 175. Cough is producing green mucus at times and is causing her throat to burn. Denies wheezing or increased SOB. Running low grade fever. Would like to have something sent in. CY- please advise. Thanks.  Current Outpatient Prescriptions on File Prior to Visit  Medication Sig Dispense Refill  . albuterol (PROVENTIL) (2.5 MG/3ML) 0.083% nebulizer solution USE 1 VIAL BY NEBULIZER EVERY 6 HOURS AS NEEDED FOR WHEEZING OR SHORTNESS OF BREATH 90 mL PRN  . Azelastine-Fluticasone (DYMISTA) 137-50 MCG/ACT SUSP One puff twice daily ea nostril 23 g 11  . budesonide-formoterol (SYMBICORT) 160-4.5 MCG/ACT inhaler Inhale 2 puffs into the lungs 2 (two) times daily. Rinse mouth 1 Inhaler 11  . chlorpheniramine-HYDROcodone (TUSSIONEX) 10-8 MG/5ML LQCR Take 5 mLs by mouth every 12 (twelve) hours as needed for cough. 200 mL 0  . cyclobenzaprine (FLEXERIL) 5 MG tablet Take 1 tablet (5 mg total) by mouth 3 (three) times daily as needed for muscle spasms (or migraines). 50 tablet 0  . EPINEPHrine 0.3 mg/0.3 mL IJ SOAJ injection Inject into thigh for severe asthma/ allergicreaction 1 Device prn  . furosemide (LASIX) 40 MG tablet Take 1 tablet (40 mg total) by mouth daily as needed. 30 tablet 5  . levocetirizine (XYZAL) 5 MG tablet TAKE 1 TABLET BY MOUTH EVERY EVENING. 30 tablet 3  . LORazepam (ATIVAN) 1 MG tablet TAKE 1 TABLET BY MOUTH AT BEDTIME AS NEEDED FOR SLEEP 30 tablet 5  . meloxicam (MOBIC) 15 MG tablet Take 1 tablet (15 mg total) by mouth daily. 60 tablet 0  . oxyCODONE (OXY IR/ROXICODONE) 5 MG immediate release tablet Take 1-2 tablets (5-10 mg total) by mouth every 4 (four) hours as needed for moderate pain or severe pain. 120 tablet 0  . pantoprazole (PROTONIX) 40 MG tablet Twice daily before meal (Patient taking differently: Take 40 mg by mouth 2 (two) times daily. ) 60 tablet 11  . PATANOL 0.1 % ophthalmic solution INSTILL 1  DROP INTO BOTH EYES TWO TIMES DAILY AS NEEDED FOR ALLERGIES 5 mL PRN  . scopolamine (TRANSDERM-SCOP) 1 MG/3DAYS Place 1 patch (1.5 mg total) onto the skin every 3 (three) days. (Patient taking differently: Place 1 patch onto the skin every 3 (three) days. Only when traveling) 10 patch 12  . Spacer/Aero-Holding Chambers (AEROCHAMBER MV) inhaler Use as instructed 1 each 0  . traMADol (ULTRAM) 50 MG tablet Take 1 tablet (50 mg total) by mouth every 4 (four) hours as needed. for pain 30 tablet 5  . VENTOLIN HFA 108 (90 BASE) MCG/ACT inhaler INHALE 2 PUFFS BY MOUTH INTO THE LUNGS EVERY 6 HOURS AS NEEDED FOR WHEEZING 18 g 11   No current facility-administered medications on file prior to visit.   Allergies  Allergen Reactions  . Influenza A (H1n1) Monoval Vac     Per pt report  . Omalizumab     Arvid Right* REACTION: angioedema-looses airway  . Promethazine Hcl     REACTION: hallucinations

## 2015-04-26 NOTE — Telephone Encounter (Signed)
Spoke with pt. She is aware of CY's recommendations. Rx's have been sent in. While on the phone she stated that her current neb machine broke and will need a new one. This order has been placed. Nothing further was needed.

## 2015-04-28 ENCOUNTER — Encounter: Payer: Self-pay | Admitting: Internal Medicine

## 2015-04-28 ENCOUNTER — Telehealth: Payer: Self-pay | Admitting: Internal Medicine

## 2015-04-28 ENCOUNTER — Ambulatory Visit (INDEPENDENT_AMBULATORY_CARE_PROVIDER_SITE_OTHER): Payer: 59 | Admitting: Internal Medicine

## 2015-04-28 VITALS — BP 136/80 | HR 98

## 2015-04-28 DIAGNOSIS — J4540 Moderate persistent asthma, uncomplicated: Secondary | ICD-10-CM | POA: Diagnosis not present

## 2015-04-28 DIAGNOSIS — J4541 Moderate persistent asthma with (acute) exacerbation: Secondary | ICD-10-CM

## 2015-04-28 DIAGNOSIS — S83289A Other tear of lateral meniscus, current injury, unspecified knee, initial encounter: Secondary | ICD-10-CM | POA: Diagnosis not present

## 2015-04-28 DIAGNOSIS — J45998 Other asthma: Secondary | ICD-10-CM | POA: Diagnosis not present

## 2015-04-28 MED ORDER — LEVALBUTEROL HCL 0.63 MG/3ML IN NEBU
0.6300 mg | INHALATION_SOLUTION | Freq: Once | RESPIRATORY_TRACT | Status: AC
  Administered 2015-04-28: 0.63 mg via RESPIRATORY_TRACT

## 2015-04-28 MED ORDER — OSELTAMIVIR PHOSPHATE 75 MG PO CAPS: 75.0000 mg | ORAL_CAPSULE | Freq: Two times a day (BID) | ORAL | Status: DC

## 2015-04-28 MED ORDER — TUSSIONEX PENNKINETIC ER 10-8 MG/5ML PO SUER: ORAL | Status: DC

## 2015-04-28 MED ORDER — METHYLPREDNISOLONE ACETATE 80 MG/ML IJ SUSP
80.0000 mg | Freq: Once | INTRAMUSCULAR | Status: AC
  Administered 2015-04-28: 80 mg via INTRAMUSCULAR

## 2015-04-28 MED FILL — ALBUTEROL 0.083 MG/ML SOLN: (2.5 MG/3ML | 7 days supply | Qty: 90 | Fill #1

## 2015-04-28 MED FILL — TUSSIONEX PENNKINETIC SUSP: 10-8 | 14 days supply | Qty: 140 | Fill #0

## 2015-04-28 MED FILL — TAMIFLU 75 MG GELCAP: 75 | 5 days supply | Qty: 10 | Fill #0

## 2015-04-28 NOTE — Patient Instructions (Signed)
Script for Tamiflu  Script for Tussionex  Ok to take the Z pak  Stay well hydrated

## 2015-04-28 NOTE — Telephone Encounter (Signed)
Called spoke with pt. Added to scheduled today.
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
## 2015-04-29 ENCOUNTER — Ambulatory Visit: Payer: Self-pay | Admitting: Internal Medicine

## 2015-05-03 NOTE — Assessment & Plan Note (Signed)
Suspicious for influenza. She does not accept flu vaccine. Plan-Tamiflu, hydration, supportive care, hydration, Tussionex

## 2015-05-04 ENCOUNTER — Ambulatory Visit (INDEPENDENT_AMBULATORY_CARE_PROVIDER_SITE_OTHER): Payer: 59 | Admitting: Internal Medicine

## 2015-05-04 ENCOUNTER — Ambulatory Visit (INDEPENDENT_AMBULATORY_CARE_PROVIDER_SITE_OTHER)
Admission: RE | Admit: 2015-05-04 | Discharge: 2015-05-04 | Disposition: A | Discharge status: Home / Self Care | Payer: 59 | Source: Ambulatory Visit | Attending: Internal Medicine | Admitting: Internal Medicine

## 2015-05-04 ENCOUNTER — Encounter: Payer: Self-pay | Admitting: Internal Medicine

## 2015-05-04 VITALS — BP 158/98 | HR 78 | Temp 98.7°F | Ht 63.0 in | Wt 260.0 lb

## 2015-05-04 DIAGNOSIS — J111 Influenza due to unidentified influenza virus with other respiratory manifestations: Secondary | ICD-10-CM | POA: Diagnosis not present

## 2015-05-04 DIAGNOSIS — R509 Fever, unspecified: Secondary | ICD-10-CM | POA: Diagnosis not present

## 2015-05-04 DIAGNOSIS — J209 Acute bronchitis, unspecified: Secondary | ICD-10-CM

## 2015-05-04 DIAGNOSIS — R05 Cough: Secondary | ICD-10-CM | POA: Diagnosis not present

## 2015-05-04 DIAGNOSIS — I10 Essential (primary) hypertension: Secondary | ICD-10-CM | POA: Diagnosis not present

## 2015-05-04 DIAGNOSIS — J4541 Moderate persistent asthma with (acute) exacerbation: Secondary | ICD-10-CM | POA: Diagnosis not present

## 2015-05-04 DIAGNOSIS — K219 Gastro-esophageal reflux disease without esophagitis: Secondary | ICD-10-CM

## 2015-05-04 DIAGNOSIS — R0602 Shortness of breath: Secondary | ICD-10-CM | POA: Diagnosis not present

## 2015-05-04 LAB — POCT INFLUENZA A/B
Influenza A, POC: NEGATIVE
Influenza B, POC: NEGATIVE

## 2015-05-04 NOTE — Assessment & Plan Note (Signed)
BP elevated at this visit 158/98. She blames it on recent coughing. We asked her to follow-up with her primary physician

## 2015-05-04 NOTE — Patient Instructions (Addendum)
Order- nasal swab for influenza  Order- CXR- acute bronchitis, flu syndrome, risk of reflux/ aspiration  Ok to supplement your Tussionex with tessalon perles( script), otc Delsym, throat lozenges, sips of liquids  Sleep upright and strictly follow your reflux control measures  Finish Tamiflu  We can keep July appointment- sooner if needed

## 2015-05-04 NOTE — Progress Notes (Signed)
Subjective:    Patient ID: [name], female    DOB: Jul 08, 1965, 50 y.o.   MRN: [id number]  HPI 50 y.o.  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 Pending left knee meniscus surgery Asks refill Tussionex brand name, saying generic version gave pounding headache. Office Spirometry 07/02/2014-within normal limits. FVC 2.39/87%, FEV1 2.04/89%, FEV1/FVC 2.04, FEF 25-75 percent 2.59/87%.  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 Fever and chills as well(101F before coming to office today), body aches. Pt had 4 tamiflu pills left and took them. Pt is unable to take flu shot. Pt came back form [place] trip 04-14-15. Acute illness with some myalgias, fever, much cough including tussive incontinence of urine, thick yellow-green mucus with a couple of blood streaks. Has a Z-Pak. Says generic Tussionex gives her headache and she requests brand name.  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  Fever. At last visit given Tamiflu, tussionex Feels unsteady and suspects she may be aspirating some. No fever or purulent sputum. Finished prednisone  ROS-see HPI Constitutional:   No-   weight loss, night sweats, fevers, chills, fatigue, lassitude. HEENT:   No-  headaches, difficulty swallowing, tooth/dental problems, sore throat,       No-  sneezing, itching, ear ache, nasal congestion, post nasal drip,  CV:  No-   chest pain, orthopnea, PND, swelling in lower extremities, anasarca,                                                    dizziness, palpitations Resp: + shortness of breath with exertion or at rest.               + productive cough, non-productive cough,  No- coughing up of blood.              +   change in color of mucus.  +wheezing.   Skin: No-   rash or lesions. GI:  No-   heartburn, indigestion, abdominal pain, nausea, vomiting,  GU:  MS:  No-   joint pain or swelling.   Neuro-     nothing unusual Psych:  No- change in mood or affect. No depression or anxiety.  No memory loss.  OBJ- Physical Exam General- Alert, Oriented, Affect-appropriate, Distress- none acute, obese Skin- rash-none, lesions- none, excoriation- none Lymphadenopathy- none Head- atraumatic            Eyes- Gross vision intact, PERRLA, conjunctivae and secretions clear            Ears- Hearing, canals-normal            Nose- + sniffing, no-Septal dev, mucus, polyps, erosion, perforation             Throat- Mallampati II , mucosa clear , drainage- none, tonsils- atrophic Neck- flexible , trachea midline, no stridor , thyroid nl, carotid no bruit Chest - symmetrical excursion , unlabored           Heart/CV- RRR , no murmur , no gallop  , no rub, nl s1 s2                           - JVD- none , edema- none, stasis  changes- none, varices- none           Lung- clear   wheeze-none, cough + less than her previous visit , dullness-none, rub- none           Chest wall-  Abd-  Br/ Gen/ Rectal- Not done, not indicated Extrem- cyanosis- none, clubbing, none, atrophy- none, strength- nl, cane Neuro- grossly intact to observation    Assessment & Plan:

## 2015-05-04 NOTE — Assessment & Plan Note (Signed)
Asthmatic bronchitis syndrome. Nasal swab PCR for flu was negative Plan-ST x-ray, finish Tamiflu, continue hydration, stay off of prednisone for now

## 2015-05-04 NOTE — Assessment & Plan Note (Signed)
Recent dilatation. She is concerned she might be aspirating. Plan-aggressive compliance with reflux precautions. Sleeps sitting up until this is doing better. Chest x-ray

## 2015-05-24 ENCOUNTER — Other Ambulatory Visit: Payer: Self-pay | Admitting: Internal Medicine

## 2015-05-24 MED FILL — CYCLOBENZAPRINE 5 MG TABLET: 5 | 20 days supply | Qty: 60 | Fill #0

## 2015-05-24 NOTE — Telephone Encounter (Signed)
CY Please advise on refill. Thanks.  

## 2015-05-25 MED FILL — LORazepam 1 MG TABS: 1 | 30 days supply | Qty: 30 | Fill #0

## 2015-05-25 NOTE — Telephone Encounter (Signed)
Ok to refill x 6 months 

## 2015-05-26 MED FILL — IBUPROFEN 800 MG TABLET: 800 | 30 days supply | Qty: 90 | Fill #0

## 2015-05-27 ENCOUNTER — Other Ambulatory Visit: Payer: Self-pay | Admitting: Internal Medicine

## 2015-05-27 ENCOUNTER — Encounter: Payer: Self-pay | Admitting: Internal Medicine

## 2015-05-27 MED FILL — FUROSEMIDE 40 MG TABLET: 40 | 90 days supply | Qty: 90 | Fill #0

## 2015-05-30 NOTE — Telephone Encounter (Signed)
Tramadol 50 mg last refilled 10/07/14 #30 x 5 refills Sig: Take 1 tablet (50 mg total) by mouth every 4 (four) hours as needed.   Please advise Dr. Annamaria Boots thanks

## 2015-05-30 NOTE — Telephone Encounter (Signed)
Ok to refill 

## 2015-05-30 NOTE — Telephone Encounter (Signed)
Refill request received for Tramadol. Please advise if ok to refill. Thank you. 

## 2015-05-31 ENCOUNTER — Telehealth: Payer: Self-pay | Admitting: Internal Medicine

## 2015-05-31 MED ORDER — EPINEPHRINE 0.3 MG/0.3ML IJ SOAJ: INTRAMUSCULAR | Status: DC

## 2015-05-31 MED ORDER — BUDESONIDE-FORMOTEROL FUMARATE 160-4.5 MCG/ACT IN AERO: 2.0000 | INHALATION_SPRAY | Freq: Two times a day (BID) | RESPIRATORY_TRACT | Status: DC

## 2015-05-31 MED ORDER — ALBUTEROL SULFATE (2.5 MG/3ML) 0.083% IN NEBU: INHALATION_SOLUTION | RESPIRATORY_TRACT | Status: DC

## 2015-05-31 MED ORDER — AZELASTINE-FLUTICASONE 137-50 MCG/ACT NA SUSP: NASAL | Status: DC

## 2015-05-31 MED ORDER — VENTOLIN HFA 108 (90 BASE) MCG/ACT IN AERS: INHALATION_SPRAY | RESPIRATORY_TRACT | Status: DC

## 2015-05-31 MED ORDER — PATANOL 0.1 % OP SOLN: OPHTHALMIC | Status: DC

## 2015-05-31 MED ORDER — LEVOCETIRIZINE DIHYDROCHLORIDE 5 MG PO TABS: 5.0000 mg | ORAL_TABLET | Freq: Every evening | ORAL | Status: DC

## 2015-05-31 MED ORDER — TRAMADOL HCL 50 MG PO TABS: 50.0000 mg | ORAL_TABLET | ORAL | Status: DC | PRN

## 2015-05-31 MED FILL — LEVOCETIRIZINE 5 MG TABLET: 5 | 30 days supply | Qty: 30 | Fill #0

## 2015-05-31 MED FILL — traMADol HCL 50 MG TABS: 50 | 5 days supply | Qty: 30 | Fill #0

## 2015-05-31 MED FILL — EPINEPHRINE 0.3 MG AUTO-INJ: 0.3 | 30 days supply | Qty: 2 | Fill #0

## 2015-05-31 NOTE — Telephone Encounter (Signed)
Called spoke with patient who is requesting a refill on her Tramadol - she is completely out.  Did inform pt that CY is not in the office at this time and she is okay to wait for authorization.    Pt is also requesting a refill on all of her other medications filled by CY: Lasix 40mg  QD prn, Pantoprazole 40mg  BID, Albuterol neb Q6H prn, Ventolin HFA Q6H prn, Dymista, Symbicort 160, Xyzal 5mg  QD, Patanol prn, EpiPen prn.  Verified medications with patient.  Advised will send what I can and will need authorization from [facility] [place] (as it does not appear that we typically fill all these and most have not been refilled since 09/2014 or 2015) for the rest and it will likely be tomorrow.    Dymista, Symbicort, Ventolin, Albuterol neb sent to [facility].  Dr Annamaria Boots please advise on the Xyzal (last rx 08/2014 w/ 3 refills), Lasix (last rx 09/2014), Pantoprazole (last rx 08/2013), Patanol (last x 12/2013), EpiPen (last rx 12/2013) and Tramadol (last rx 09/2014).  Thank you.  Last ov 1.11.17: Patient Instructions       Order- nasal swab for influenza  Order- CXR- acute bronchitis, flu syndrome, risk of reflux/ aspiration  Ok to supplement your Tussionex with tessalon perles( script), otc Delsym, throat lozenges, sips of liquids  Sleep upright and strictly follow your reflux control measures  Finish Tamiflu  We can keep July appointment- sooner if needed

## 2015-05-31 NOTE — Telephone Encounter (Signed)
Tramadol telephoned to [facility], pharmacist Aaron Edelman Other rx's that were okayed by [facility] sent electronically to [facility]  Left detailed message on pt's named voicemail informing her that CY recommends to have the Lasix and Pantoprazole refilled by her PCP but that all other meds have been sent to the pharmacy.  Will sign off.

## 2015-05-31 NOTE — Telephone Encounter (Signed)
Closing msg. System went down while inputting the msg.  Started a new msg and sent to triage.

## 2015-05-31 NOTE — Telephone Encounter (Signed)
Lasix and pantoprazole should be refilled by PCP.  We can refill the rest including tramadol and xyzal

## 2015-06-17 MED FILL — OLOPATADINE HCL 0.1% EYE DR: 0.1 | 30 days supply | Qty: 5 | Fill #0

## 2015-06-20 MED FILL — traMADol HCL 50 MG TABS: 50 | 5 days supply | Qty: 30 | Fill #1

## 2015-08-03 MED FILL — traMADol HCL 50 MG TABS: 50 | 5 days supply | Qty: 30 | Fill #2

## 2015-08-08 MED FILL — LORazepam 1 MG TABS: 1 | 30 days supply | Qty: 30 | Fill #1

## 2015-08-08 MED FILL — LEVOCETIRIZINE 5 MG TABLET: 5 | 30 days supply | Qty: 30 | Fill #1

## 2015-08-08 MED FILL — OLOPATADINE HCL 0.1% EYE DR: 0.1 | 30 days supply | Qty: 5 | Fill #1

## 2015-08-08 MED FILL — DYMISTA NASAL SPRAY: 137-50 | 30 days supply | Qty: 23 | Fill #0

## 2015-08-09 MED FILL — CYCLOBENZAPRINE 5 MG TABLET: 5 | 20 days supply | Qty: 60 | Fill #0

## 2015-08-09 MED FILL — IBUPROFEN 800 MG TABLET: 800 | 30 days supply | Qty: 90 | Fill #0

## 2015-09-21 DIAGNOSIS — H5203 Hypermetropia, bilateral: Secondary | ICD-10-CM | POA: Diagnosis not present

## 2015-09-30 MED FILL — CYCLOBENZAPRINE 5 MG TABLET: 5 | 20 days supply | Qty: 60 | Fill #0

## 2015-09-30 MED FILL — VENTOLIN HFA 90 MCG INHALER: 108 (90 BAS | 25 days supply | Qty: 18 | Fill #0

## 2015-09-30 MED FILL — LORazepam 1 MG TABS: 1 | 30 days supply | Qty: 30 | Fill #2

## 2015-09-30 MED FILL — FUROSEMIDE 40 MG TABLET: 40 | 90 days supply | Qty: 90 | Fill #1

## 2015-10-06 MED FILL — traMADol HCL 50 MG TABS: 50 | 5 days supply | Qty: 30 | Fill #3

## 2015-10-12 MED FILL — ALBUTEROL 0.083 MG/ML SOLN: (2.5 MG/3ML | 7 days supply | Qty: 90 | Fill #0

## 2015-10-13 ENCOUNTER — Ambulatory Visit (INDEPENDENT_AMBULATORY_CARE_PROVIDER_SITE_OTHER): Payer: 59 | Admitting: Adult Health

## 2015-10-13 ENCOUNTER — Encounter: Payer: Self-pay | Admitting: Adult Health

## 2015-10-13 VITALS — BP 148/88 | HR 81 | Temp 98.1°F | Ht 63.0 in | Wt 237.0 lb

## 2015-10-13 DIAGNOSIS — J4541 Moderate persistent asthma with (acute) exacerbation: Secondary | ICD-10-CM | POA: Diagnosis not present

## 2015-10-13 MED ORDER — PREDNISONE 10 MG PO TABS: ORAL_TABLET | ORAL | Status: DC

## 2015-10-13 MED ORDER — TUSSIONEX PENNKINETIC ER 10-8 MG/5ML PO SUER: ORAL | Status: DC

## 2015-10-13 MED ORDER — AZITHROMYCIN 250 MG PO TABS: ORAL_TABLET | ORAL | Status: AC

## 2015-10-13 NOTE — Patient Instructions (Signed)
Zpack take as directed.  Prednisone taper over next week .  Mucinex DM Twice daily  As needed   Saline nasal rinses As needed   Follow up with Dr. Annamaria Boots  In 2 months and As needed   Please contact office for sooner follow up if symptoms do not improve or worsen or seek emergency care

## 2015-10-13 NOTE — Progress Notes (Signed)
Subjective:    Patient ID: [name], female    DOB: 1966-03-19, 50 y.o.   MRN: [id number]  HPI 50 year old female never smoker with moderate persistent asthma and allergic rhinitis  10/13/2015 acute office visit Patient presents for an acute office visit She complains of prod cough with yellow/green mucus, sinus pressure, nose bleeds x 3 days, low grade fever at times, SOB and wheezing starting on 10/10/15. Denies any chest congestion/tightness, nausea or vomiting.  Remains on Symbicort twice daily, Xyzal, daily.and dymista.  She denies any chest pain, orthopnea, PND, leg swelling or orthopnea.    Past Medical History  Diagnosis Date  . Pseudotumor cerebri     has required LP for release of pressure  . Allergic rhinitis   . Asthma     h/o intubation 2001  . GERD (gastroesophageal reflux disease)   . Dysphagia     Dr. Ardis Hughs.  egd w/ dilatation 06/08/2007  . Steroid-induced hyperglycemia 05/08/2013  . Headache     hx migraines  . Meniscal injury   . Esophageal dilatation   . Hypertension in pregnancy   . Morbid obesity with body mass index of 45.0-49.9 in adult [facility])    Current Outpatient Prescriptions on File Prior to Visit  Medication Sig Dispense Refill  . albuterol (PROVENTIL) (2.5 MG/3ML) 0.083% nebulizer solution USE 1 VIAL BY NEBULIZER EVERY 6 HOURS AS NEEDED FOR WHEEZING OR SHORTNESS OF BREATH 90 mL 5  . Azelastine-Fluticasone (DYMISTA) 137-50 MCG/ACT SUSP One puff twice daily ea nostril 23 g 5  . budesonide-formoterol (SYMBICORT) 160-4.5 MCG/ACT inhaler Inhale 2 puffs into the lungs 2 (two) times daily. Rinse mouth 1 Inhaler 5  . chlorpheniramine-HYDROcodone (TUSSIONEX) 10-8 MG/5ML LQCR Take 5 mLs by mouth every 12 (twelve) hours as needed for cough. 200 mL 0  . cyclobenzaprine (FLEXERIL) 5 MG tablet Take 1 tablet (5 mg total) by mouth 3 (three) times daily as needed for muscle spasms (or migraines). 50 tablet 0  . EPINEPHrine 0.3 mg/0.3 mL IJ SOAJ injection Inject  into thigh for severe asthma/ allergicreaction 1 Device prn  . furosemide (LASIX) 40 MG tablet Take 1 tablet (40 mg total) by mouth daily as needed. 30 tablet 5  . levocetirizine (XYZAL) 5 MG tablet Take 1 tablet (5 mg total) by mouth every evening. 30 tablet 5  . LORazepam (ATIVAN) 1 MG tablet TAKE 1 TABLET BY MOUTH AT BEDTIME AS NEEDED FOR SLEEP 30 tablet 5  . meloxicam (MOBIC) 15 MG tablet Take 1 tablet (15 mg total) by mouth daily. 60 tablet 0  . oxyCODONE (OXY IR/ROXICODONE) 5 MG immediate release tablet Take 1-2 tablets (5-10 mg total) by mouth every 4 (four) hours as needed for moderate pain or severe pain. 120 tablet 0  . pantoprazole (PROTONIX) 40 MG tablet Twice daily before meal (Patient taking differently: Take 40 mg by mouth 2 (two) times daily. ) 60 tablet 11  . PATANOL 0.1 % ophthalmic solution INSTILL 1 DROP INTO BOTH EYES TWO TIMES DAILY AS NEEDED FOR ALLERGIES 5 mL 5  . scopolamine (TRANSDERM-SCOP) 1 MG/3DAYS Place 1 patch (1.5 mg total) onto the skin every 3 (three) days. 10 patch 12  . Spacer/Aero-Holding Chambers (AEROCHAMBER MV) inhaler Use as instructed 1 each 0  . traMADol (ULTRAM) 50 MG tablet Take 1 tablet (50 mg total) by mouth every 4 (four) hours as needed. for pain 30 tablet 5  . TUSSIONEX PENNKINETIC ER 10-8 MG/5ML SUER 5 ml every 12 hours if needed for cough  140 mL 0  . VENTOLIN HFA 108 (90 Base) MCG/ACT inhaler INHALE 2 PUFFS BY MOUTH INTO THE LUNGS EVERY 6 HOURS AS NEEDED FOR WHEEZING 18 g 5   No current facility-administered medications on file prior to visit.     Review of Systems  Constitutional:   No  weight loss, night sweats,  Fevers, chills, fatigue, or  lassitude.  HEENT:   No headaches,  Difficulty swallowing,  Tooth/dental problems, or  Sore throat,                No sneezing, itching, ear ache,  +nasal congestion, post nasal drip,   CV:  No chest pain,  Orthopnea, PND, swelling in lower extremities, anasarca, dizziness, palpitations, syncope.    GI  No heartburn, indigestion, abdominal pain, nausea, vomiting, diarrhea, change in bowel habits, loss of appetite, bloody stools.   Resp:  .  No chest wall deformity  Skin: no rash or lesions.  GU: no dysuria, change in color of urine, no urgency or frequency.  No flank pain, no hematuria   MS:  No joint pain or swelling.  No decreased range of motion.  No back pain.  Psych:  No change in mood or affect. No depression or anxiety.  No memory loss.         Objective:   Physical Exam Filed Vitals:   10/13/15 1408  BP: 148/88  Pulse: 81  Temp: 98.1 F (36.7 C)  TempSrc: Oral  Height: 5\' 3"  (1.6 m)  Weight: 237 lb (107.502 kg)  SpO2: 99%   GEN: A/Ox3; pleasant , NAD, well nourished   HEENT:  [place]/AT,  EACs-clear, TMs-wnl, NOSE-clear drainage , THROAT-clear, no lesions, no postnasal drip or exudate noted.   NECK:  Supple w/ fair ROM; no JVD; normal carotid impulses w/o bruits; no thyromegaly or nodules palpated; no lymphadenopathy.  RESP trace exp wheeze , no accessory muscle use, no dullness to percussion  CARD:  RRR, no m/r/g  , no peripheral edema, pulses intact, no cyanosis or clubbing.  GI:   Soft & nt; nml bowel sounds; no organomegaly or masses detected.  Musco: Warm bil, no deformities or joint swelling noted.   Neuro: alert, no focal deficits noted.    Skin: Warm, no lesions or rashes  Yoselin Amerman NP-C  [facility]  10/13/2015       Assessment & Plan:

## 2015-10-13 NOTE — Assessment & Plan Note (Signed)
Flare   Plan  Zpack take as directed.  Prednisone taper over next week .  Mucinex DM Twice daily  As needed   Saline nasal rinses As needed   Follow up with Dr. Annamaria Boots  In 2 months and As needed   Please contact office for sooner follow up if symptoms do not improve or worsen or seek emergency care

## 2015-10-27 ENCOUNTER — Other Ambulatory Visit (INDEPENDENT_AMBULATORY_CARE_PROVIDER_SITE_OTHER): Payer: 59

## 2015-10-27 ENCOUNTER — Ambulatory Visit (INDEPENDENT_AMBULATORY_CARE_PROVIDER_SITE_OTHER): Payer: 59 | Admitting: Internal Medicine

## 2015-10-27 ENCOUNTER — Encounter: Payer: Self-pay | Admitting: Internal Medicine

## 2015-10-27 VITALS — BP 126/74 | HR 100 | Ht 63.0 in

## 2015-10-27 DIAGNOSIS — J4540 Moderate persistent asthma, uncomplicated: Secondary | ICD-10-CM | POA: Diagnosis not present

## 2015-10-27 DIAGNOSIS — K219 Gastro-esophageal reflux disease without esophagitis: Secondary | ICD-10-CM

## 2015-10-27 LAB — CBC WITH DIFFERENTIAL/PLATELET
Basophils Absolute: 0 10*3/uL (ref 0.0–0.1)
Basophils Relative: 0.6 % (ref 0.0–3.0)
Eosinophils Absolute: 0.3 10*3/uL (ref 0.0–0.7)
Eosinophils Relative: 4.5 % (ref 0.0–5.0)
HCT: 36.6 % (ref 36.0–46.0)
Hemoglobin: 12.1 g/dL (ref 12.0–15.0)
Lymphocytes Relative: 34.7 % (ref 12.0–46.0)
Lymphs Abs: 2.6 10*3/uL (ref 0.7–4.0)
MCHC: 32.9 g/dL (ref 30.0–36.0)
MCV: 80.2 fl (ref 78.0–100.0)
Monocytes Absolute: 0.5 10*3/uL (ref 0.1–1.0)
Monocytes Relative: 6.4 % (ref 3.0–12.0)
Neutro Abs: 4 10*3/uL (ref 1.4–7.7)
Neutrophils Relative %: 53.8 % (ref 43.0–77.0)
Platelets: 284 10*3/uL (ref 150.0–400.0)
RBC: 4.57 Mil/uL (ref 3.87–5.11)
RDW: 14.4 % (ref 11.5–15.5)
WBC: 7.4 10*3/uL (ref 4.0–10.5)

## 2015-10-27 NOTE — Patient Instructions (Signed)
Order- lab- CBC w diff, total IgE    Dx asthma, moderate persistent  Please call as needed

## 2015-10-27 NOTE — Assessment & Plan Note (Signed)
She is not wetting her struggle with her weight. She knows this aggravates her respiratory problems and arthritis.

## 2015-10-27 NOTE — Assessment & Plan Note (Addendum)
She is not acutely ill today although peak flows a little lower than usual. We discussed meds. No obvious acute infection. Current meds seem to be controlling her. Emphasis remains on reflux precautions at this time of year when pollens and viral infections are less important Plan-recheck eosinophils and total IgE. She might be a candidate for Nucala. Currently off prednisone.

## 2015-10-27 NOTE — Assessment & Plan Note (Signed)
Had of fundoplication but aware she still needs to follow reflux precautions.

## 2015-10-27 NOTE — Progress Notes (Signed)
Subjective:    Patient ID: [name], female    DOB: Sep 10, 1965, 50 y.o.   MRN: [id number]  HPI 50 y.o.  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 Pending left knee meniscus surgery Asks refill Tussionex brand name, saying generic version gave pounding headache. Office Spirometry 07/02/2014-within normal limits. FVC 2.39/87%, FEV1 2.04/89%, FEV1/FVC 2.04, FEF 25-75 percent 2.59/87%.  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 Fever and chills as well(101F before coming to office today), body aches. Pt had 4 tamiflu pills left and took them. Pt is unable to take flu shot. Pt came back form [place] trip 04-14-15. Acute illness with some myalgias, fever, much cough including tussive incontinence of urine, thick yellow-green mucus with a couple of blood streaks. Has a Z-Pak. Says generic Tussionex gives her headache and she requests brand name.  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  Fever. At last visit given Tamiflu, tussionex Feels unsteady and suspects she may be aspirating some. No fever or purulent sputum. 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 Uses Symbicort daily, rescue inhaler and nebulizer machine only with flares. Using proton X-denies reflux/heartburn. Speech therapist also told her to swallow a little vinegar daily.  ROS-see HPI Constitutional:   No-   weight loss, night sweats, fevers, chills, fatigue, lassitude. HEENT:   No-  headaches, difficulty swallowing, tooth/dental problems, sore throat,       No-  sneezing, itching, ear ache, nasal congestion, post nasal drip,  CV:  No-   chest pain, orthopnea, PND, swelling in lower extremities, anasarca,                                                    dizziness, palpitations Resp: + shortness of breath with exertion or at rest.  productive cough, non-productive cough,  No- coughing up of blood.                 change in color of mucus.  wheezing.   Skin: No-   rash or lesions. GI:  No-   heartburn, indigestion, abdominal pain, nausea, vomiting,  GU:  MS:  No-   joint pain or swelling.   Neuro-     nothing unusual Psych:  No- change in mood or affect. No depression or anxiety.  No memory loss.  OBJ- Physical Exam General- Alert, Oriented, Affect-appropriate, Distress- none acute, + morbidly obese Skin- rash-none, lesions- none,  excoriation- none Lymphadenopathy- none Head- atraumatic            Eyes- Gross vision intact, PERRLA, conjunctivae and secretions clear            Ears- Hearing, canals-normal            Nose- sniffing-none, no-Septal dev, mucus, polyps, erosion, perforation             Throat- Mallampati II , mucosa clear , drainage- none, tonsils- atrophic Neck- flexible , trachea midline, no stridor , thyroid nl, carotid no bruit Chest - symmetrical excursion , unlabored           Heart/CV- RRR , no murmur , no gallop  , no rub, nl s1 s2                           - JVD- none , edema- none, stasis changes- none, varices- none           Lung- clear   wheeze-none, cough + with deep breath , dullness-none, rub- none           Chest wall-  Abd-  Br/ Gen/ Rectal- Not done, not indicated Extrem- cyanosis- none, clubbing, none, atrophy- none, strength- nl, cane Neuro- grossly intact to observation    Assessment & Plan:

## 2015-10-28 LAB — IGE: IgE (Immunoglobulin E), Serum: 166 kU/L — ABNORMAL HIGH (ref <115)

## 2015-11-11 DIAGNOSIS — Z113 Encounter for screening for infections with a predominantly sexual mode of transmission: Secondary | ICD-10-CM | POA: Diagnosis not present

## 2015-11-11 DIAGNOSIS — N760 Acute vaginitis: Secondary | ICD-10-CM | POA: Diagnosis not present

## 2015-11-11 DIAGNOSIS — Z114 Encounter for screening for human immunodeficiency virus [HIV]: Secondary | ICD-10-CM | POA: Diagnosis not present

## 2015-11-11 MED FILL — metroNIDAZOLE 0.75 % GEL: 0.75 | 5 days supply | Qty: 70 | Fill #0

## 2015-12-01 ENCOUNTER — Other Ambulatory Visit: Payer: Self-pay | Admitting: Internal Medicine

## 2015-12-01 NOTE — Telephone Encounter (Signed)
Tramadol last refilled 05/31/15 #30 x 5 refills 1 tab po q4hrs prn Please advise Dr. Annamaria Boots thanks

## 2015-12-01 NOTE — Telephone Encounter (Signed)
Ok to refill 6 months 

## 2015-12-02 MED FILL — traMADol HCL 50 MG TABS: 50 | 5 days supply | Qty: 30 | Fill #0

## 2015-12-02 MED FILL — TERCONAZOLE 0.4% VAG CREAM: 0.4 | 7 days supply | Qty: 45 | Fill #0

## 2015-12-20 ENCOUNTER — Emergency Department (HOSPITAL_COMMUNITY)
Admission: EM | Admit: 2015-12-20 | Discharge: 2015-12-20 | Disposition: A | Discharge status: Home / Self Care | Payer: 59 | Attending: Emergency Medicine | Admitting: Emergency Medicine

## 2015-12-20 ENCOUNTER — Encounter: Payer: Self-pay | Admitting: Internal Medicine

## 2015-12-20 ENCOUNTER — Emergency Department (HOSPITAL_COMMUNITY): Payer: 59

## 2015-12-20 ENCOUNTER — Encounter (HOSPITAL_COMMUNITY): Payer: Self-pay

## 2015-12-20 DIAGNOSIS — J45901 Unspecified asthma with (acute) exacerbation: Secondary | ICD-10-CM | POA: Insufficient documentation

## 2015-12-20 DIAGNOSIS — Z79899 Other long term (current) drug therapy: Secondary | ICD-10-CM | POA: Insufficient documentation

## 2015-12-20 DIAGNOSIS — R05 Cough: Secondary | ICD-10-CM | POA: Diagnosis not present

## 2015-12-20 DIAGNOSIS — R0602 Shortness of breath: Secondary | ICD-10-CM | POA: Diagnosis present

## 2015-12-20 DIAGNOSIS — I10 Essential (primary) hypertension: Secondary | ICD-10-CM | POA: Insufficient documentation

## 2015-12-20 MED ORDER — ALBUTEROL SULFATE (2.5 MG/3ML) 0.083% IN NEBU
5.0000 mg | INHALATION_SOLUTION | Freq: Once | RESPIRATORY_TRACT | Status: AC
  Administered 2015-12-20: 5 mg via RESPIRATORY_TRACT

## 2015-12-20 MED ORDER — LORAZEPAM 1 MG PO TABS
1.0000 mg | ORAL_TABLET | Freq: Once | ORAL | Status: AC
  Administered 2015-12-20: 1 mg via ORAL
  Filled 2015-12-20: qty 1

## 2015-12-20 MED ORDER — IPRATROPIUM BROMIDE 0.02 % IN SOLN: 0.5000 mg | Freq: Once | RESPIRATORY_TRACT | Status: DC

## 2015-12-20 MED ORDER — ALBUTEROL SULFATE (2.5 MG/3ML) 0.083% IN NEBU
5.0000 mg | INHALATION_SOLUTION | Freq: Once | RESPIRATORY_TRACT | Status: DC
  Filled 2015-12-20: qty 6

## 2015-12-20 MED ORDER — IPRATROPIUM BROMIDE 0.02 % IN SOLN
0.5000 mg | Freq: Once | RESPIRATORY_TRACT | Status: DC
  Filled 2015-12-20: qty 2.5

## 2015-12-20 MED ORDER — DEXAMETHASONE SODIUM PHOSPHATE 10 MG/ML IJ SOLN
10.0000 mg | Freq: Once | INTRAMUSCULAR | Status: AC
  Administered 2015-12-20: 10 mg via INTRAMUSCULAR
  Filled 2015-12-20: qty 1

## 2015-12-20 MED ORDER — PREDNISONE 20 MG PO TABS
60.0000 mg | ORAL_TABLET | Freq: Once | ORAL | Status: DC
Start: 2015-12-20 — End: 2015-12-20
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
## 2015-12-20 NOTE — Discharge Instructions (Signed)
Use your albuterol every 4 hours for the next 48 hours. If you're needing it more than this, come back to the ER or see her doctor.

## 2015-12-20 NOTE — ED Triage Notes (Signed)
Patient complains of increased shortness of breath while at meeting this am. States that she didn't fell well yesterday and this am used neb prior to arriving at work, shortness of breath on arrival to triage with wheezing. States she feels anxious due to being intubated in the past. Alert and oriented

## 2015-12-20 NOTE — ED Provider Notes (Signed)
Patient with hx asthma, c/o non prod cough, progressive dyspnea, chest tight, for the past few days.  Was at meeting in hospital, and began to feel much worse. Similar to prior asthma exacerbation. Remote hx vent for asthma exacerbation. No fevers. Use mdi/neb prn. No recent pred use. No increased edema.   Wheezing on exam.  Albuterol and atrovent neb. pred po. Also c/o anxiety/stress, feeling panicky - ?related to asthma/dyspnea. Reassurance. Also w give ativan 1 mg po.    Will move into room as soon as able for EDP eval, and additional tx.       Lajean Saver, MD 12/20/15 [phone]

## 2015-12-21 ENCOUNTER — Other Ambulatory Visit: Payer: Self-pay | Admitting: Internal Medicine

## 2015-12-21 MED FILL — ALBUTEROL 0.083 MG/ML SOLN: (2.5 MG/3ML | 7 days supply | Qty: 90 | Fill #1

## 2015-12-21 MED FILL — EPINEPHRINE 0.3 MG AUTO-INJ: 0.3 | 30 days supply | Qty: 2 | Fill #1

## 2015-12-21 MED FILL — CYCLOBENZAPRINE 5 MG TABLET: 5 | 20 days supply | Qty: 60 | Fill #0

## 2015-12-21 MED FILL — FUROSEMIDE 40 MG TABLET: 40 | 90 days supply | Qty: 90 | Fill #0

## 2015-12-21 MED FILL — IBUPROFEN 800 MG TABLET: 800 | 30 days supply | Qty: 90 | Fill #0

## 2015-12-21 NOTE — Telephone Encounter (Signed)
Ok to refill both lorazepam and furosemide

## 2015-12-21 NOTE — Telephone Encounter (Signed)
Spoke with pt on the phone. She has been scheduled to see TP on 12/23/15 at 9:30am. Nothing further was needed.

## 2015-12-21 NOTE — Telephone Encounter (Signed)
CY Please advise on Lorazepam refill request. Thanks.

## 2015-12-22 MED FILL — LORazepam 1 MG TABS: 1 | 30 days supply | Qty: 30 | Fill #0

## 2015-12-23 ENCOUNTER — Encounter: Payer: Self-pay | Admitting: Adult Health

## 2015-12-23 ENCOUNTER — Ambulatory Visit (INDEPENDENT_AMBULATORY_CARE_PROVIDER_SITE_OTHER): Payer: 59 | Admitting: Adult Health

## 2015-12-23 DIAGNOSIS — J4541 Moderate persistent asthma with (acute) exacerbation: Secondary | ICD-10-CM

## 2015-12-23 MED ORDER — BUDESONIDE-FORMOTEROL FUMARATE 160-4.5 MCG/ACT IN AERO: 2.0000 | INHALATION_SPRAY | Freq: Two times a day (BID) | RESPIRATORY_TRACT | 5 refills | Status: DC

## 2015-12-23 MED ORDER — PREDNISONE 10 MG PO TABS: ORAL_TABLET | ORAL | 0 refills | Status: DC

## 2015-12-23 MED FILL — predniSONE 10 MG TABS: 10 | 8 days supply | Qty: 20 | Fill #0

## 2015-12-23 NOTE — Progress Notes (Signed)
Subjective:    Patient ID: [name], female    DOB: [birth year], 50 y.o.   MRN: [id number]  HPI 50 year old female never smoker with moderate persistent asthma and allergic rhinitis  12/23/2015  ER follow up  Patient presents for an ER follow up .  Complains of 5 days of wheezing, dyspnea and tightness. Has minimally productive cough .  Was at work and sx got worse, went ot ER .  tx w/ IV steroids, and neb treatment.  She is feeling better but not quite back to baseline.  Wheezing has decreaesd.  Remains on Symbicort twice daily, Xyzal, daily.and dymista.  She denies any chest pain, orthopnea, PND, leg swelling or orthopnea.    Past Medical History:  Diagnosis Date  . Allergic rhinitis   . Asthma    h/o intubation 2001  . Dysphagia    Dr. Ardis Hughs.  egd w/ dilatation 06/08/2007  . Esophageal dilatation   . GERD (gastroesophageal reflux disease)   . Headache    hx migraines  . Hypertension in pregnancy   . Meniscal injury   . Morbid obesity with body mass index of 45.0-49.9 in adult [facility])   . Pseudotumor cerebri    has required LP for release of pressure  . Steroid-induced hyperglycemia 05/08/2013   Current Outpatient Prescriptions on File Prior to Visit  Medication Sig Dispense Refill  . albuterol (PROVENTIL) (2.5 MG/3ML) 0.083% nebulizer solution USE 1 VIAL BY NEBULIZER EVERY 6 HOURS AS NEEDED FOR WHEEZING OR SHORTNESS OF BREATH 90 mL 5  . Azelastine-Fluticasone (DYMISTA) 137-50 MCG/ACT SUSP One puff twice daily ea nostril 23 g 5  . budesonide-formoterol (SYMBICORT) 160-4.5 MCG/ACT inhaler Inhale 2 puffs into the lungs 2 (two) times daily. Rinse mouth 1 Inhaler 5  . cyclobenzaprine (FLEXERIL) 5 MG tablet Take 1 tablet (5 mg total) by mouth 3 (three) times daily as needed for muscle spasms (or migraines). 50 tablet 0  . EPINEPHrine 0.3 mg/0.3 mL IJ SOAJ injection Inject into thigh for severe asthma/ allergicreaction 1 Device prn  . furosemide (LASIX) 40 MG tablet TAKE 1  TABLET BY MOUTH ONCE DAILY AS NEEDED 30 tablet 5  . ibuprofen (ADVIL,MOTRIN) 800 MG tablet Take 1 tablet by mouth daily as needed.  0  . levocetirizine (XYZAL) 5 MG tablet Take 1 tablet (5 mg total) by mouth every evening. 30 tablet 5  . LORazepam (ATIVAN) 1 MG tablet TAKE 1 TABLET BY MOUTH AT BEDTIME AS NEEDED. 30 tablet 5  . meloxicam (MOBIC) 15 MG tablet Take 1 tablet (15 mg total) by mouth daily. 60 tablet 0  . oxyCODONE (OXY IR/ROXICODONE) 5 MG immediate release tablet Take 1-2 tablets (5-10 mg total) by mouth every 4 (four) hours as needed for moderate pain or severe pain. 120 tablet 0  . pantoprazole (PROTONIX) 40 MG tablet Twice daily before meal (Patient taking differently: Take 40 mg by mouth 2 (two) times daily. ) 60 tablet 11  . PATANOL 0.1 % ophthalmic solution INSTILL 1 DROP INTO BOTH EYES TWO TIMES DAILY AS NEEDED FOR ALLERGIES 5 mL 5  . scopolamine (TRANSDERM-SCOP) 1 MG/3DAYS Place 1 patch (1.5 mg total) onto the skin every 3 (three) days. 10 patch 12  . Spacer/Aero-Holding Chambers (AEROCHAMBER MV) inhaler Use as instructed 1 each 0  . traMADol (ULTRAM) 50 MG tablet TAKE 1 TABLET BY MOUTH EVERY 4 HOURS AS NEEDED FOR PAIN 30 tablet 5  . TUSSIONEX PENNKINETIC ER 10-8 MG/5ML SUER 5 ml every 12 hours if needed  for cough 140 mL 0  . VENTOLIN HFA 108 (90 Base) MCG/ACT inhaler INHALE 2 PUFFS BY MOUTH INTO THE LUNGS EVERY 6 HOURS AS NEEDED FOR WHEEZING 18 g 5   No current facility-administered medications on file prior to visit.      Review of Systems  Constitutional:   No  weight loss, night sweats,  Fevers, chills, fatigue, or  lassitude.  HEENT:   No headaches,  Difficulty swallowing,  Tooth/dental problems, or  Sore throat,                No sneezing, itching, ear ache,  +nasal congestion, post nasal drip,   CV:  No chest pain,  Orthopnea, PND, swelling in lower extremities, anasarca, dizziness, palpitations, syncope.   GI  No heartburn, indigestion, abdominal pain, nausea,  vomiting, diarrhea, change in bowel habits, loss of appetite, bloody stools.   Resp:  .  No chest wall deformity  Skin: no rash or lesions.  GU: no dysuria, change in color of urine, no urgency or frequency.  No flank pain, no hematuria   MS:  No joint pain or swelling.  No decreased range of motion.  No back pain.  Psych:  No change in mood or affect. No depression or anxiety.  No memory loss.         Objective:   Physical Exam Vitals:   12/23/15 0951  BP: (!) 146/82  Pulse: 84  Temp: 98.4 F (36.9 C)  TempSrc: Oral  SpO2: 100%  Height: 5\' 3"  (1.6 m)   GEN: A/Ox3; pleasant , NAD, well nourished    HEENT:  [place]/AT,  EACs-clear, TMs-wnl, NOSE-clear drainage , THROAT-clear, no lesions, no postnasal drip or exudate noted.   NECK:  Supple w/ fair ROM; no JVD; normal carotid impulses w/o bruits; no thyromegaly or nodules palpated; no lymphadenopathy.    RESP trace exp wheeze ,  no accessory muscle use, no dullness to percussion  CARD:  RRR, no m/r/g  , no peripheral edema, pulses intact, no cyanosis or clubbing.  GI:   Soft & nt; nml bowel sounds; no organomegaly or masses detected.   Musco: Warm bil, no deformities or joint swelling noted.   Neuro: alert, no focal deficits noted.    Skin: Warm, no lesions or rashes  Lismary Kiehn NP-C  [facility]  12/23/2015

## 2015-12-23 NOTE — Patient Instructions (Signed)
Prednisone taper over next week .  Mucinex DM Twice daily  As needed   Saline nasal rinses As needed   Follow up with Dr. Annamaria Boots  In 3 months and As needed   Please contact office for sooner follow up if symptoms do not improve or worsen or seek emergency care

## 2015-12-23 NOTE — Assessment & Plan Note (Signed)
Flare   Plan  Patient Instructions  Prednisone taper over next week .  Mucinex DM Twice daily  As needed   Saline nasal rinses As needed   Follow up with Dr. Annamaria Boots  In 3 months and As needed   Please contact office for sooner follow up if symptoms do not improve or worsen or seek emergency care

## 2015-12-23 NOTE — Addendum Note (Signed)
Addended by: Osa Craver on: 12/23/2015 10:36 AM   Modules accepted: Orders

## 2016-01-03 MED FILL — traMADol HCL 50 MG TABS: 50 | 5 days supply | Qty: 30 | Fill #1

## 2016-01-18 DIAGNOSIS — R05 Cough: Secondary | ICD-10-CM | POA: Diagnosis not present

## 2016-01-18 DIAGNOSIS — Z0000 Encounter for general adult medical examination without abnormal findings: Secondary | ICD-10-CM | POA: Diagnosis not present

## 2016-01-18 DIAGNOSIS — Z131 Encounter for screening for diabetes mellitus: Secondary | ICD-10-CM | POA: Diagnosis not present

## 2016-01-18 DIAGNOSIS — Z136 Encounter for screening for cardiovascular disorders: Secondary | ICD-10-CM | POA: Diagnosis not present

## 2016-01-18 DIAGNOSIS — Z01118 Encounter for examination of ears and hearing with other abnormal findings: Secondary | ICD-10-CM | POA: Diagnosis not present

## 2016-01-18 DIAGNOSIS — J4540 Moderate persistent asthma, uncomplicated: Secondary | ICD-10-CM | POA: Diagnosis not present

## 2016-01-18 DIAGNOSIS — K219 Gastro-esophageal reflux disease without esophagitis: Secondary | ICD-10-CM | POA: Diagnosis not present

## 2016-01-18 DIAGNOSIS — J302 Other seasonal allergic rhinitis: Secondary | ICD-10-CM | POA: Diagnosis not present

## 2016-01-18 MED FILL — AZITHROMYCIN 250 MG TABLET: 250 | 5 days supply | Qty: 6 | Fill #0

## 2016-01-19 ENCOUNTER — Other Ambulatory Visit: Payer: Self-pay | Admitting: Internal Medicine

## 2016-01-19 MED FILL — SCOPOLAMINE 1 MG/3 DAY PATC: 1 | 30 days supply | Qty: 10 | Fill #0

## 2016-01-19 MED FILL — FLUTICASONE PROP 50 MCG SPR: 50 | 30 days supply | Qty: 16 | Fill #0

## 2016-01-19 MED FILL — predniSONE 50 MG TABS: 50 | 5 days supply | Qty: 5 | Fill #0

## 2016-01-20 MED FILL — TUSSIONEX PENNKINETIC SUSP: 10-8 | 14 days supply | Qty: 140 | Fill #0

## 2016-01-25 DIAGNOSIS — B351 Tinea unguium: Secondary | ICD-10-CM | POA: Diagnosis not present

## 2016-01-25 DIAGNOSIS — B353 Tinea pedis: Secondary | ICD-10-CM | POA: Diagnosis not present

## 2016-01-25 DIAGNOSIS — L669 Cicatricial alopecia, unspecified: Secondary | ICD-10-CM | POA: Diagnosis not present

## 2016-01-25 MED FILL — ECONAZOLE NITRATE 1% CREAM: 1 | 14 days supply | Qty: 85 | Fill #0

## 2016-01-25 MED FILL — UREA 40% CREAM: 40 | 30 days supply | Qty: 198 | Fill #0

## 2016-01-26 ENCOUNTER — Ambulatory Visit (INDEPENDENT_AMBULATORY_CARE_PROVIDER_SITE_OTHER): Payer: 59 | Admitting: Internal Medicine

## 2016-01-26 ENCOUNTER — Telehealth: Payer: Self-pay | Admitting: Internal Medicine

## 2016-01-26 ENCOUNTER — Ambulatory Visit (INDEPENDENT_AMBULATORY_CARE_PROVIDER_SITE_OTHER)
Admission: RE | Admit: 2016-01-26 | Discharge: 2016-01-26 | Disposition: A | Discharge status: Home / Self Care | Payer: 59 | Source: Ambulatory Visit | Attending: Internal Medicine | Admitting: Internal Medicine

## 2016-01-26 ENCOUNTER — Encounter: Payer: Self-pay | Admitting: Internal Medicine

## 2016-01-26 VITALS — BP 132/96 | HR 80 | Ht 63.5 in | Wt 256.0 lb

## 2016-01-26 DIAGNOSIS — J45909 Unspecified asthma, uncomplicated: Secondary | ICD-10-CM | POA: Diagnosis not present

## 2016-01-26 DIAGNOSIS — I10 Essential (primary) hypertension: Secondary | ICD-10-CM

## 2016-01-26 DIAGNOSIS — J4531 Mild persistent asthma with (acute) exacerbation: Secondary | ICD-10-CM

## 2016-01-26 LAB — NITRIC OXIDE: Nitric Oxide: 10

## 2016-01-26 MED ORDER — PREDNISONE 10 MG PO TABS: ORAL_TABLET | ORAL | 0 refills | Status: DC

## 2016-01-26 MED ORDER — AMOXICILLIN-POT CLAVULANATE 875-125 MG PO TABS: 1.0000 | ORAL_TABLET | Freq: Two times a day (BID) | ORAL | 0 refills | Status: AC

## 2016-01-26 MED ORDER — METHYLPREDNISOLONE ACETATE 80 MG/ML IJ SUSP
120.0000 mg | Freq: Once | INTRAMUSCULAR | Status: AC
  Administered 2016-01-26: 120 mg via INTRAMUSCULAR

## 2016-01-26 MED FILL — predniSONE 10 MG TABS: 10 | 6 days supply | Qty: 14 | Fill #0

## 2016-01-26 MED FILL — AMOX-CLAV 875-125 MG TABLET: 875-125 | 10 days supply | Qty: 20 | Fill #0

## 2016-01-26 NOTE — Telephone Encounter (Signed)
Spoke with pt, c/o fever, chills, fatigue, sob X2 weeks. Pt has seen PCP for this and was given a zpak, did not improve symptoms.  Pt states if she cannot be seen today she fears she will end up in ED. Pt scheduled at 4:00 with MW.  Nothing further needed.

## 2016-01-26 NOTE — Telephone Encounter (Signed)
Patient returning call - she can be reached at [phone]

## 2016-01-26 NOTE — Patient Instructions (Signed)
Augmentin 875 mg take one pill twice daily  X 10 days - take at breakfast and supper with large glass of water.  It would help reduce the usual side effects (diarrhea and yeast infections) if you ate cultured yogurt at lunch.   Prednisone 10 mg take  4 each am x 2 days,   2 each am x 2 days,  1 each am x 2 days and stop   Work on inhaler technique:  relax and gently blow all the way out then take a nice smooth deep breath back in, triggering the inhaler at same time you start breathing in.  Hold for up to 5 seconds if you can. Blow out thru nose. Rinse and gargle with water when done  Please remember to go to the   x-ray department downstairs for your tests - we will call you with the results when they are available.  F/u with Dr Annamaria Boots or Tammy NP in 4 weeks - call sooner if needed

## 2016-01-26 NOTE — Progress Notes (Signed)
Subjective:    Patient ID: [name], female    DOB: [birth year], 50 y.o.   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  Complains of 5 days of wheezing, dyspnea and tightness. Has minimally productive cough .  Was at work and sx got worse, went ot ER .  tx w/ IV steroids, and neb treatment.  She is feeling better but not quite back to baseline.  Wheezing has decreaesd.  Remains on Symbicort twice daily, Xyzal, daily.and dymista.  rec Prednisone taper over next week .  Mucinex DM Twice daily  As needed   Saline nasal rinses As needed   Follow up with Dr. Annamaria Boots  In 3 months and As needed     01/26/2016  Extended acute ov/Wert re: refractory cough and wheeze x late aug 2017  Chief Complaint  Patient presents with  . Acute Visit    Reports SOB, coughing and wheezing. Cough is producing green mucus. Denies chest tightness. Symptoms started 2 weeks ago.  Baseline on symbicort 160 2bid/ xyzal/ protonix bid and rare need for saba then flared same day went to ER 12/20/15 with sob s cough  After saw Tammy NP back to 75% then a week gradually worse again  hfa quite poor - see a/p  In last 2 weeks also c/o bifrontal ha and green nasal  secretions  Is comfortable p saba neb for up to 4 h    No obvious day to day or daytime variability or assoc mucus plugs or hemoptysis or cp or chest tightness, subjective wheeze or overt   hb symptoms. No unusual exp hx or h/o childhood pna/ asthma or knowledge of premature birth.   . Also denies any obvious fluctuation of symptoms with weather or environmental changes or other aggravating or alleviating factors except as outlined above   Current Medications, Allergies, Complete Past Medical History, Past Surgical History, Family History, and Social History were reviewed in [employer] record.  ROS  The following are not active complaints unless bolded sore throat, dysphagia, dental problems, itching, sneezing,  nasal congestion or excess/ purulent secretions, ear ache,   fever, chills, sweats, unintended wt loss, classically pleuritic or exertional cp,  orthopnea pnd or leg swelling, presyncope, palpitations, abdominal pain, anorexia, nausea, vomiting, diarrhea  or change in bowel or bladder habits, change in stools or urine, dysuria,hematuria,  rash, arthralgias, visual complaints, headache, numbness, weakness or ataxia or problems with walking or coordination,  change in mood/affect or memory.               Objective:   Physical Exam   amb ? slt cushingnoid obese bf nad w/in 2 h of last neb   Wt Readings from Last 3 Encounters:  01/26/16 256 lb (116.1 kg)  10/13/15 237 lb (107.5 kg)  05/04/15 260 lb (117.9 kg)    Vital signs reviewed - note sats 99% on RA on arrival/  BP elevated     HEENT: nl dentition, turbinates, and oropharynx. Nl external ear canals without cough reflex   NECK :  without JVD/Nodes/TM/ nl carotid upstrokes bilaterally   LUNGS: no acc muscle use,  Nl contour chest  With minimal true wheeze but dry cough on insp and exp    CV:  RRR  no s3 or murmur  or increase in P2, no edema   ABD:  soft and nontender with nl inspiratory excursion in the supine position. No bruits or organomegaly, bowel sounds nl  MS:  Nl gait/ ext warm without deformities, calf tenderness, cyanosis or clubbing No obvious joint restrictions   SKIN: warm and dry without lesions    NEURO:  alert, approp, nl sensorium with  no motor deficits     CXR PA and Lateral:   01/26/2016 :    I personally reviewed images and agree with radiology impression as follows:    No acute abnormalities.

## 2016-01-26 NOTE — Assessment & Plan Note (Addendum)
FENO 01/26/2016  =   10 during flare while on symb 160 2bid though hfa poor  - 01/26/2016  After extensive coaching HFA effectiveness =   75% from a baseline of 25%    DDX of  difficult airways management almost all start with A and  include Adherence, Ace Inhibitors, Acid Reflux, Active Sinus Disease, Alpha 1 Antitripsin deficiency, Anxiety masquerading as Airways dz,  ABPA,  Allergy(esp in young), Aspiration (esp in elderly), Adverse effects of meds,  Active smokers, A bunch of PE's (a small clot burden can't cause this syndrome unless there is already severe underlying pulm or vascular dz with poor reserve) plus two Bs  = Bronchiectasis and Beta blocker use..and one C= CHF   In this case Adherence is the biggest issue and starts with  inability to use HFA effectively and also  understand that SABA treats the symptoms but doesn't get to the underlying problem (inflammation).  I used  the analogy of putting steroid cream on a rash to help explain the meaning of topical therapy and the need to get the drug to the target tissue.  - needs to work much harder on mastering hfa or change to dpi though with latter risk irritating upper airway   ? Acid (or non-acid) GERD > always difficult to exclude as up to 75% of pts in some series report no assoc GI/ Heartburn symptoms> rec continue max (24h)  acid suppression    ? Active sinus dz > augmentin x 10 days then sinus ct if sinus symptoms not better before continuing abx further   ? Allergy >  Low feno rules against asthma but not other upper airway allergic activity > Depomedrol 120 IM at pt request plus  Prednisone 10 mg take  4 each am x 2 days,   2 each am x 2 days,  1 each am x 2 days and stop and continue dymista > f/u Dr Annamaria Boots   ? Anxiety dx of exclusion     I had an extended discussion with the patient reviewing all relevant studies completed to date and  lasting 25 minutes of a 40  minute acute extended office  visit  Addressing refractory symptoms  persisting now x 6 weeks despite approp rx   Each maintenance medication was reviewed in detail including most importantly the difference between maintenance and prns and under what circumstances the prns are to be triggered using an action plan format that is not reflected in the computer generated alphabetically organized AVS.    Please see instructions for details which were reviewed in writing and the patient given a copy highlighting the part that I personally wrote and discussed at today's ov.

## 2016-01-26 NOTE — Telephone Encounter (Signed)
Patient returning call - [phone]

## 2016-01-26 NOTE — Telephone Encounter (Signed)
LMTC x 1  

## 2016-01-26 NOTE — Assessment & Plan Note (Signed)
Body mass index is 44.64 kg/m.  No results found for: TSH   Contributing to gerd tendency/ doe/reviewed the need and the process to achieve and maintain neg calorie balance > defer f/u primary care including intermittently monitoring thyroid status

## 2016-01-26 NOTE — Assessment & Plan Note (Signed)
Probably secondary to pred/ stress > rec avoid salt and monitor > Follow up per Primary Care planned

## 2016-01-27 MED FILL — traMADol HCL 50 MG TABS: 50 | 5 days supply | Qty: 30 | Fill #2

## 2016-03-06 ENCOUNTER — Ambulatory Visit: Payer: Self-pay | Admitting: Internal Medicine

## 2016-03-27 MED FILL — LORazepam 1 MG TABS: 1 | 30 days supply | Qty: 30 | Fill #1

## 2016-03-27 MED FILL — traMADol HCL 50 MG TABS: 50 | 5 days supply | Qty: 30 | Fill #3

## 2016-03-27 MED FILL — ECONAZOLE NITRATE 1% CREAM: 1 | 14 days supply | Qty: 85 | Fill #1

## 2016-03-27 MED FILL — IBUPROFEN 800 MG TABLET: 800 | 30 days supply | Qty: 90 | Fill #1

## 2016-03-27 MED FILL — ALBUTEROL 0.083 MG/ML SOLN: (2.5 MG/3ML | 7 days supply | Qty: 90 | Fill #2

## 2016-03-27 MED FILL — UREA 40% CREAM: 40 | 30 days supply | Qty: 198 | Fill #1

## 2016-03-27 MED FILL — EPINEPHRINE 0.3 MG AUTO-INJ: 0.3 | 30 days supply | Qty: 2 | Fill #2

## 2016-03-28 MED FILL — CYCLOBENZAPRINE 5 MG TABLET: 5 | 20 days supply | Qty: 60 | Fill #0

## 2016-04-18 ENCOUNTER — Encounter (INDEPENDENT_AMBULATORY_CARE_PROVIDER_SITE_OTHER): Payer: Self-pay | Admitting: Family Medicine

## 2016-04-20 ENCOUNTER — Other Ambulatory Visit: Payer: Self-pay | Admitting: Adult Health

## 2016-04-22 ENCOUNTER — Other Ambulatory Visit: Payer: Self-pay | Admitting: Critical Care Medicine

## 2016-04-22 ENCOUNTER — Other Ambulatory Visit: Payer: Self-pay | Admitting: Adult Health

## 2016-04-25 NOTE — Telephone Encounter (Signed)
TP pt is requesting a refill of the tussionex.  Please advise. thanks

## 2016-04-30 ENCOUNTER — Ambulatory Visit: Payer: Self-pay | Admitting: Internal Medicine

## 2016-05-01 ENCOUNTER — Telehealth: Payer: Self-pay | Admitting: Internal Medicine

## 2016-05-01 MED FILL — traMADol HCL 50 MG TABS: 50 | 5 days supply | Qty: 30 | Fill #4

## 2016-05-01 NOTE — Telephone Encounter (Signed)
Pt scheduled for acute visit.  Nothing further needed.

## 2016-05-01 NOTE — Telephone Encounter (Signed)
atc pt, no answer, mailbox full. Pt needs to be scheduled for first available acute visit with any provider.

## 2016-05-01 NOTE — Telephone Encounter (Signed)
Pt returning call.[name] ° °

## 2016-05-01 NOTE — Telephone Encounter (Signed)
Maybe she can work in with NP. My shchedule is full

## 2016-05-01 NOTE — Telephone Encounter (Signed)
Spoke with who states she missed her acute visit with CY yesterday, due to a fever of 104. Pt states she took some medication and fell asleep. Pt is requesting to be seen today. I was attempting to schedule pt with another provider and pt asked that I have Katie call her.  KW & CY please advise. Thanks.

## 2016-05-02 ENCOUNTER — Encounter: Payer: Self-pay | Admitting: Adult Health

## 2016-05-02 ENCOUNTER — Encounter (HOSPITAL_COMMUNITY): Payer: Self-pay | Admitting: *Deleted

## 2016-05-02 ENCOUNTER — Inpatient Hospital Stay (HOSPITAL_COMMUNITY)
Admission: AD | Admit: 2016-05-02 | Discharge: 2016-05-09 | DRG: 202 | Disposition: A | Discharge status: Home / Self Care | Payer: 59 | Source: Ambulatory Visit | Attending: Pulmonary Disease | Admitting: Pulmonary Disease

## 2016-05-02 ENCOUNTER — Ambulatory Visit (INDEPENDENT_AMBULATORY_CARE_PROVIDER_SITE_OTHER): Payer: 59 | Admitting: Adult Health

## 2016-05-02 ENCOUNTER — Telehealth: Payer: Self-pay | Admitting: Adult Health

## 2016-05-02 ENCOUNTER — Ambulatory Visit (INDEPENDENT_AMBULATORY_CARE_PROVIDER_SITE_OTHER)
Admission: RE | Admit: 2016-05-02 | Discharge: 2016-05-02 | Disposition: A | Discharge status: Home / Self Care | Payer: 59 | Source: Ambulatory Visit | Attending: Adult Health | Admitting: Adult Health

## 2016-05-02 ENCOUNTER — Inpatient Hospital Stay (HOSPITAL_COMMUNITY): Payer: 59

## 2016-05-02 DIAGNOSIS — T380X5A Adverse effect of glucocorticoids and synthetic analogues, initial encounter: Secondary | ICD-10-CM | POA: Diagnosis present

## 2016-05-02 DIAGNOSIS — R51 Headache: Secondary | ICD-10-CM | POA: Diagnosis present

## 2016-05-02 DIAGNOSIS — B370 Candidal stomatitis: Secondary | ICD-10-CM | POA: Diagnosis not present

## 2016-05-02 DIAGNOSIS — J45909 Unspecified asthma, uncomplicated: Secondary | ICD-10-CM | POA: Diagnosis not present

## 2016-05-02 DIAGNOSIS — R05 Cough: Secondary | ICD-10-CM | POA: Diagnosis not present

## 2016-05-02 DIAGNOSIS — G932 Benign intracranial hypertension: Secondary | ICD-10-CM | POA: Diagnosis present

## 2016-05-02 DIAGNOSIS — J45901 Unspecified asthma with (acute) exacerbation: Secondary | ICD-10-CM

## 2016-05-02 DIAGNOSIS — I10 Essential (primary) hypertension: Secondary | ICD-10-CM | POA: Diagnosis present

## 2016-05-02 DIAGNOSIS — J3089 Other allergic rhinitis: Secondary | ICD-10-CM

## 2016-05-02 DIAGNOSIS — Z7951 Long term (current) use of inhaled steroids: Secondary | ICD-10-CM | POA: Diagnosis not present

## 2016-05-02 DIAGNOSIS — J0100 Acute maxillary sinusitis, unspecified: Secondary | ICD-10-CM | POA: Diagnosis not present

## 2016-05-02 DIAGNOSIS — R739 Hyperglycemia, unspecified: Secondary | ICD-10-CM

## 2016-05-02 DIAGNOSIS — J019 Acute sinusitis, unspecified: Secondary | ICD-10-CM | POA: Diagnosis present

## 2016-05-02 DIAGNOSIS — J4520 Mild intermittent asthma, uncomplicated: Secondary | ICD-10-CM | POA: Diagnosis present

## 2016-05-02 DIAGNOSIS — Z6841 Body Mass Index (BMI) 40.0 and over, adult: Secondary | ICD-10-CM | POA: Diagnosis not present

## 2016-05-02 DIAGNOSIS — J4540 Moderate persistent asthma, uncomplicated: Secondary | ICD-10-CM | POA: Diagnosis not present

## 2016-05-02 DIAGNOSIS — R7989 Other specified abnormal findings of blood chemistry: Secondary | ICD-10-CM | POA: Diagnosis present

## 2016-05-02 DIAGNOSIS — J4541 Moderate persistent asthma with (acute) exacerbation: Secondary | ICD-10-CM | POA: Diagnosis not present

## 2016-05-02 DIAGNOSIS — G8929 Other chronic pain: Secondary | ICD-10-CM | POA: Diagnosis present

## 2016-05-02 DIAGNOSIS — R059 Cough, unspecified: Secondary | ICD-10-CM

## 2016-05-02 DIAGNOSIS — A419 Sepsis, unspecified organism: Secondary | ICD-10-CM

## 2016-05-02 DIAGNOSIS — K219 Gastro-esophageal reflux disease without esophagitis: Secondary | ICD-10-CM | POA: Diagnosis present

## 2016-05-02 DIAGNOSIS — J40 Bronchitis, not specified as acute or chronic: Secondary | ICD-10-CM | POA: Diagnosis not present

## 2016-05-02 DIAGNOSIS — J121 Respiratory syncytial virus pneumonia: Secondary | ICD-10-CM | POA: Diagnosis not present

## 2016-05-02 DIAGNOSIS — B9789 Other viral agents as the cause of diseases classified elsewhere: Secondary | ICD-10-CM | POA: Diagnosis not present

## 2016-05-02 DIAGNOSIS — J4550 Severe persistent asthma, uncomplicated: Secondary | ICD-10-CM | POA: Diagnosis present

## 2016-05-02 DIAGNOSIS — J205 Acute bronchitis due to respiratory syncytial virus: Principal | ICD-10-CM | POA: Diagnosis present

## 2016-05-02 DIAGNOSIS — R0602 Shortness of breath: Secondary | ICD-10-CM | POA: Diagnosis present

## 2016-05-02 DIAGNOSIS — J988 Other specified respiratory disorders: Secondary | ICD-10-CM | POA: Diagnosis not present

## 2016-05-02 DIAGNOSIS — E118 Type 2 diabetes mellitus with unspecified complications: Secondary | ICD-10-CM | POA: Diagnosis present

## 2016-05-02 DIAGNOSIS — J302 Other seasonal allergic rhinitis: Secondary | ICD-10-CM

## 2016-05-02 LAB — CBC WITH DIFFERENTIAL/PLATELET
Basophils Absolute: 0.1 10*3/uL (ref 0.0–0.1)
Basophils Relative: 1 %
Eosinophils Absolute: 0.3 10*3/uL (ref 0.0–0.7)
Eosinophils Relative: 4 %
HCT: 36.8 % (ref 36.0–46.0)
Hemoglobin: 11.9 g/dL — ABNORMAL LOW (ref 12.0–15.0)
Lymphocytes Relative: 25 %
Lymphs Abs: 1.7 10*3/uL (ref 0.7–4.0)
MCH: 26.6 pg (ref 26.0–34.0)
MCHC: 32.3 g/dL (ref 30.0–36.0)
MCV: 82.3 fL (ref 78.0–100.0)
Monocytes Absolute: 0.5 10*3/uL (ref 0.1–1.0)
Monocytes Relative: 8 %
Neutro Abs: 4.3 10*3/uL (ref 1.7–7.7)
Neutrophils Relative %: 62 %
Platelets: 247 10*3/uL (ref 150–400)
RBC: 4.47 MIL/uL (ref 3.87–5.11)
RDW: 14.1 % (ref 11.5–15.5)
WBC: 6.9 10*3/uL (ref 4.0–10.5)

## 2016-05-02 LAB — COMPREHENSIVE METABOLIC PANEL
ALT: 25 U/L (ref 14–54)
AST: 26 U/L (ref 15–41)
Albumin: 4.1 g/dL (ref 3.5–5.0)
Alkaline Phosphatase: 64 U/L (ref 38–126)
Anion gap: 9 (ref 5–15)
BUN: 10 mg/dL (ref 6–20)
CO2: 25 mmol/L (ref 22–32)
Calcium: 8.6 mg/dL — ABNORMAL LOW (ref 8.9–10.3)
Chloride: 100 mmol/L — ABNORMAL LOW (ref 101–111)
Creatinine, Ser: 1.27 mg/dL — ABNORMAL HIGH (ref 0.44–1.00)
GFR calc Af Amer: 57 mL/min — ABNORMAL LOW (ref >60)
GFR calc non Af Amer: 49 mL/min — ABNORMAL LOW (ref >60)
Glucose, Bld: 91 mg/dL (ref 65–99)
Potassium: 3.7 mmol/L (ref 3.5–5.1)
Sodium: 134 mmol/L — ABNORMAL LOW (ref 135–145)
Total Bilirubin: 0.4 mg/dL (ref 0.3–1.2)
Total Protein: 7.2 g/dL (ref 6.5–8.1)

## 2016-05-02 LAB — PROCALCITONIN: Procalcitonin: <0.1 ng/mL

## 2016-05-02 LAB — URINALYSIS, ROUTINE W REFLEX MICROSCOPIC
Bilirubin Urine: NEGATIVE
Glucose, UA: NEGATIVE mg/dL
Hgb urine dipstick: NEGATIVE
Ketones, ur: NEGATIVE mg/dL
Leukocytes, UA: NEGATIVE
Nitrite: NEGATIVE
Protein, ur: NEGATIVE mg/dL
Specific Gravity, Urine: 1.009 (ref 1.005–1.030)
pH: 7 (ref 5.0–8.0)

## 2016-05-02 LAB — GLUCOSE, CAPILLARY: Glucose-Capillary: 156 mg/dL — ABNORMAL HIGH (ref 65–99)

## 2016-05-02 LAB — PROTIME-INR
INR: 1.07
Prothrombin Time: 13.9 seconds (ref 11.4–15.2)

## 2016-05-02 LAB — LACTIC ACID, PLASMA
Lactic Acid, Venous: 1.1 mmol/L (ref 0.5–1.9)
Lactic Acid, Venous: 0.8 mmol/L (ref 0.5–1.9)

## 2016-05-02 LAB — APTT: aPTT: 35 seconds (ref 24–36)

## 2016-05-02 LAB — BRAIN NATRIURETIC PEPTIDE: B Natriuretic Peptide: 95.6 pg/mL (ref 0.0–100.0)

## 2016-05-02 LAB — MRSA PCR SCREENING: MRSA by PCR: NEGATIVE

## 2016-05-02 MED ORDER — INSULIN ASPART 100 UNIT/ML ~~LOC~~ SOLN
0.0000 [IU] | Freq: Every day | SUBCUTANEOUS | Status: DC
Start: 2016-05-02 — End: 2016-05-09

## 2016-05-02 MED ORDER — TUSSIONEX PENNKINETIC ER 10-8 MG/5ML PO SUER: ORAL | 0 refills | Status: DC

## 2016-05-02 MED ORDER — SODIUM CHLORIDE 0.9 % IV BOLUS (SEPSIS)
1000.0000 mL | Freq: Once | INTRAVENOUS | Status: AC
  Administered 2016-05-02: 1000 mL via INTRAVENOUS

## 2016-05-02 MED ORDER — METHYLPREDNISOLONE ACETATE 80 MG/ML IJ SUSP
120.0000 mg | Freq: Once | INTRAMUSCULAR | Status: AC
  Administered 2016-05-02: 120 mg via INTRAMUSCULAR

## 2016-05-02 MED ORDER — ONDANSETRON HCL 4 MG/2ML IJ SOLN: 4.0000 mg | Freq: Four times a day (QID) | INTRAMUSCULAR | Status: DC | PRN

## 2016-05-02 MED ORDER — SODIUM CHLORIDE 0.9 % IV SOLN
INTRAVENOUS | Status: DC
  Administered 2016-05-02 – 2016-05-06 (×2): via INTRAVENOUS

## 2016-05-02 MED ORDER — LEVALBUTEROL HCL 0.63 MG/3ML IN NEBU
0.6300 mg | INHALATION_SOLUTION | Freq: Once | RESPIRATORY_TRACT | Status: AC
  Administered 2016-05-02: 0.63 mg via RESPIRATORY_TRACT

## 2016-05-02 MED ORDER — OLOPATADINE HCL 0.1 % OP SOLN
1.0000 [drp] | Freq: Two times a day (BID) | OPHTHALMIC | Status: DC
  Administered 2016-05-03 – 2016-05-09 (×13): 1 [drp] via OPHTHALMIC
  Filled 2016-05-02: qty 5

## 2016-05-02 MED ORDER — IPRATROPIUM-ALBUTEROL 0.5-2.5 (3) MG/3ML IN SOLN
3.0000 mL | Freq: Four times a day (QID) | RESPIRATORY_TRACT | Status: DC
  Administered 2016-05-02 – 2016-05-03 (×2): 3 mL via RESPIRATORY_TRACT
  Filled 2016-05-02 (×3): qty 3

## 2016-05-02 MED ORDER — ACETAMINOPHEN 325 MG PO TABS
650.0000 mg | ORAL_TABLET | ORAL | Status: DC | PRN
  Administered 2016-05-05: 650 mg via ORAL
  Filled 2016-05-02 (×2): qty 2

## 2016-05-02 MED ORDER — LORATADINE 10 MG PO TABS
10.0000 mg | ORAL_TABLET | Freq: Every day | ORAL | Status: DC
  Administered 2016-05-02 – 2016-05-09 (×8): 10 mg via ORAL
  Filled 2016-05-02 (×8): qty 1

## 2016-05-02 MED ORDER — LEVOFLOXACIN IN D5W 500 MG/100ML IV SOLN: 500.0000 mg | INTRAVENOUS | Status: DC

## 2016-05-02 MED ORDER — PREDNISONE 10 MG PO TABS: ORAL_TABLET | ORAL | 0 refills | Status: DC

## 2016-05-02 MED ORDER — HEPARIN SODIUM (PORCINE) 5000 UNIT/ML IJ SOLN
5000.0000 [IU] | Freq: Three times a day (TID) | INTRAMUSCULAR | Status: DC
  Administered 2016-05-02: 5000 [IU] via SUBCUTANEOUS
  Filled 2016-05-02 (×6): qty 1

## 2016-05-02 MED ORDER — AMOXICILLIN-POT CLAVULANATE 875-125 MG PO TABS: 1.0000 | ORAL_TABLET | Freq: Two times a day (BID) | ORAL | 0 refills | Status: DC

## 2016-05-02 MED ORDER — PANTOPRAZOLE SODIUM 40 MG PO TBEC
40.0000 mg | DELAYED_RELEASE_TABLET | Freq: Every day | ORAL | Status: DC
  Administered 2016-05-03 – 2016-05-06 (×4): 40 mg via ORAL
  Filled 2016-05-02 (×4): qty 1

## 2016-05-02 MED ORDER — CYCLOBENZAPRINE HCL 5 MG PO TABS: 5.0000 mg | ORAL_TABLET | Freq: Three times a day (TID) | ORAL | Status: DC | PRN

## 2016-05-02 MED ORDER — METHYLPREDNISOLONE SODIUM SUCC 40 MG IJ SOLR
40.0000 mg | Freq: Two times a day (BID) | INTRAMUSCULAR | Status: DC
Start: 2016-05-02 — End: 2016-05-03
  Administered 2016-05-02 – 2016-05-03 (×2): 40 mg via INTRAVENOUS
  Filled 2016-05-02 (×2): qty 1

## 2016-05-02 MED ORDER — HYDROCOD POLST-CPM POLST ER 10-8 MG/5ML PO SUER
5.0000 mL | Freq: Two times a day (BID) | ORAL | Status: DC | PRN
  Administered 2016-05-02 – 2016-05-06 (×9): 5 mL via ORAL
  Filled 2016-05-02 (×9): qty 5

## 2016-05-02 MED ORDER — OXYCODONE HCL 5 MG PO TABS: 5.0000 mg | ORAL_TABLET | Freq: Four times a day (QID) | ORAL | Status: DC | PRN

## 2016-05-02 MED ORDER — DEXTROSE 5 % IV SOLN
500.0000 mg | INTRAVENOUS | Status: DC
  Administered 2016-05-02: 500 mg via INTRAVENOUS
  Filled 2016-05-02: qty 500

## 2016-05-02 MED ORDER — INSULIN ASPART 100 UNIT/ML ~~LOC~~ SOLN
0.0000 [IU] | Freq: Three times a day (TID) | SUBCUTANEOUS | Status: DC
  Administered 2016-05-03 – 2016-05-04 (×2): 3 [IU] via SUBCUTANEOUS

## 2016-05-02 MED ORDER — SODIUM CHLORIDE 0.9 % IV BOLUS (SEPSIS)
500.0000 mL | Freq: Once | INTRAVENOUS | Status: AC
  Administered 2016-05-02: 500 mL via INTRAVENOUS

## 2016-05-02 MED ORDER — DEXTROSE 5 % IV SOLN
2.0000 g | INTRAVENOUS | Status: DC
  Administered 2016-05-02: 2 g via INTRAVENOUS
  Filled 2016-05-02: qty 2

## 2016-05-02 MED ORDER — LORAZEPAM 1 MG PO TABS
1.0000 mg | ORAL_TABLET | Freq: Every evening | ORAL | Status: DC | PRN
  Administered 2016-05-02 – 2016-05-08 (×7): 1 mg via ORAL
  Filled 2016-05-02 (×7): qty 1

## 2016-05-02 MED ORDER — MOMETASONE FURO-FORMOTEROL FUM 100-5 MCG/ACT IN AERO
2.0000 | INHALATION_SPRAY | Freq: Two times a day (BID) | RESPIRATORY_TRACT | Status: DC
  Administered 2016-05-02: 2 via RESPIRATORY_TRACT
  Filled 2016-05-02: qty 8.8

## 2016-05-02 MED ORDER — FLUTICASONE PROPIONATE 50 MCG/ACT NA SUSP
1.0000 | Freq: Every day | NASAL | Status: DC
Start: 2016-05-02 — End: 2016-05-07
  Administered 2016-05-02 – 2016-05-06 (×5): 1 via NASAL
  Filled 2016-05-02: qty 16

## 2016-05-02 MED ORDER — MELOXICAM 15 MG PO TABS
15.0000 mg | ORAL_TABLET | Freq: Every day | ORAL | Status: DC
  Administered 2016-05-03 – 2016-05-09 (×7): 15 mg via ORAL
  Filled 2016-05-02 (×7): qty 1

## 2016-05-02 MED ORDER — ALBUTEROL SULFATE (2.5 MG/3ML) 0.083% IN NEBU
2.5000 mg | INHALATION_SOLUTION | RESPIRATORY_TRACT | Status: DC | PRN
  Administered 2016-05-04 – 2016-05-08 (×4): 2.5 mg via RESPIRATORY_TRACT
  Filled 2016-05-02 (×4): qty 3

## 2016-05-02 MED ORDER — OXYCODONE HCL 5 MG PO TABS
5.0000 mg | ORAL_TABLET | Freq: Four times a day (QID) | ORAL | Status: DC | PRN
  Administered 2016-05-02 – 2016-05-08 (×6): 5 mg via ORAL
  Filled 2016-05-02 (×6): qty 1

## 2016-05-02 MED ORDER — LEVOFLOXACIN IN D5W 750 MG/150ML IV SOLN
750.0000 mg | Freq: Once | INTRAVENOUS | Status: DC
  Filled 2016-05-02: qty 150

## 2016-05-02 MED FILL — TUSSIONEX PENNKINETIC SUSP: 10-8 | 11 days supply | Qty: 115 | Fill #0

## 2016-05-02 NOTE — Assessment & Plan Note (Signed)
Flare  Plan  Patient Instructions  Augmentin 875mg  Twice daily  For 7 days , take w/ food .  Mucinex DM Twice daily  As needed  Cough/congestion  Prednisone taper over next week.  Fluids and rest .  Chest xray today .  follow up Dr. Annamaria Boots  Next week as planned and As needed   Please contact office for sooner follow up if symptoms do not improve or worsen or seek emergency care

## 2016-05-02 NOTE — Progress Notes (Signed)
@Patient  ID: [name], female    DOB: Mar 02, 1966, 51 y.o.   MRN: [id number]  Chief Complaint  Patient presents with  . Acute Visit    Cough     Referring provider: No ref. provider found  HPI: 51 yo female never smoker with moderate persistent asthma and allergic rhinitis   05/02/2016 Acute OV  Pt complains of 3 days of nasal congestion, stuffiness, cough with thick yellow mucus and wheezing .  Has increased albuterol nebs at home . Cough is keeping her up at night , wants refill of tussionex.  She denies  body aches. Not able to take flu shot (allergic) . She remains on symbicort w/ no missed doses . She has not been taking any otc meds.  Appetite is down but no nv/d.  Not able to go to work today , needs work note.  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    Allergies  Allergen Reactions  . Influenza A (H1n1) Monoval Vac     Per pt report  . Omalizumab     Arvid Right* REACTION: angioedema-looses airway  . Promethazine Hcl     REACTION: hallucinations    Immunization History  Administered Date(s) Administered  . Pneumococcal Polysaccharide-23 05/24/2009    Past Medical History:  Diagnosis Date  . Allergic rhinitis   . Asthma    h/o intubation 2001  . Dysphagia    Dr. Ardis Hughs.  egd w/ dilatation 06/08/2007  . Esophageal dilatation   . GERD (gastroesophageal reflux disease)   . Headache    hx migraines  . Hypertension in pregnancy   . Meniscal injury   . Morbid obesity with body mass index of 45.0-49.9 in adult [facility])   . Pseudotumor cerebri    has required LP for release of pressure  . Steroid-induced hyperglycemia 05/08/2013    Tobacco History: History  Smoking Status  . Never Smoker  Smokeless Tobacco  . Never Used   Counseling given: Not Answered   Outpatient Encounter Prescriptions as of 05/02/2016  Medication Sig  . albuterol (PROVENTIL) (2.5 MG/3ML) 0.083% nebulizer solution USE 1 VIAL BY NEBULIZER EVERY 6 HOURS AS NEEDED FOR WHEEZING  OR SHORTNESS OF BREATH  . Azelastine-Fluticasone (DYMISTA) 137-50 MCG/ACT SUSP One puff twice daily ea nostril  . budesonide-formoterol (SYMBICORT) 160-4.5 MCG/ACT inhaler Inhale 2 puffs into the lungs 2 (two) times daily. Rinse mouth  . cyclobenzaprine (FLEXERIL) 5 MG tablet Take 1 tablet (5 mg total) by mouth 3 (three) times daily as needed for muscle spasms (or migraines).  Marland Kitchen econazole nitrate 1 % cream As needed  . EPINEPHrine 0.3 mg/0.3 mL IJ SOAJ injection Inject into thigh for severe asthma/ allergicreaction  . furosemide (LASIX) 40 MG tablet TAKE 1 TABLET BY MOUTH ONCE DAILY AS NEEDED  . ibuprofen (ADVIL,MOTRIN) 800 MG tablet Take 1 tablet by mouth daily as needed.  Marland Kitchen levocetirizine (XYZAL) 5 MG tablet Take 1 tablet (5 mg total) by mouth every evening.  Marland Kitchen LORazepam (ATIVAN) 1 MG tablet TAKE 1 TABLET BY MOUTH AT BEDTIME AS NEEDED.  Marland Kitchen meloxicam (MOBIC) 15 MG tablet Take 1 tablet (15 mg total) by mouth daily.  Marland Kitchen oxyCODONE (OXY IR/ROXICODONE) 5 MG immediate release tablet Take 1-2 tablets (5-10 mg total) by mouth every 4 (four) hours as needed for moderate pain or severe pain.  . pantoprazole (PROTONIX) 40 MG tablet Twice daily before meal (Patient taking differently: Take 40 mg by mouth 2 (two) times daily. )  .  PATANOL 0.1 % ophthalmic solution INSTILL 1 DROP INTO BOTH EYES TWO TIMES DAILY AS NEEDED FOR ALLERGIES  . Spacer/Aero-Holding Chambers (AEROCHAMBER MV) inhaler Use as instructed  . traMADol (ULTRAM) 50 MG tablet TAKE 1 TABLET BY MOUTH EVERY 4 HOURS AS NEEDED FOR PAIN  . TRANSDERM-SCOP, 1.5 MG, 1 MG/3DAYS APPLY 1 PATCH ONTO THE SKIN EVERY 3 DAYS.  Cathie Hoops ER 10-8 MG/5ML SUER 5 ml every 12 hours if needed for cough  . urea (CARMOL) 40 % CREA As needed  . VENTOLIN HFA 108 (90 Base) MCG/ACT inhaler INHALE 2 PUFFS BY MOUTH INTO THE LUNGS EVERY 6 HOURS AS NEEDED FOR WHEEZING  . [DISCONTINUED] predniSONE (DELTASONE) 10 MG tablet Take  4 each am x 2 days,   2 each am x 2  days,  1 each am x 2 days and stop (Patient not taking: Reported on 05/02/2016)   No facility-administered encounter medications on file as of 05/02/2016.      Review of Systems  Constitutional:   No  weight loss, night sweats,  Fevers, chills, fatigue, or  lassitude.  HEENT:   No headaches,  Difficulty swallowing,  Tooth/dental problems, or  Sore throat,                No sneezing, itching, ear ache,  +nasal congestion, post nasal drip,   CV:  No chest pain,  Orthopnea, PND, swelling in lower extremities, anasarca, dizziness, palpitations, syncope.   GI  No heartburn, indigestion, abdominal pain, nausea, vomiting, diarrhea, change in bowel habits, loss of appetite, bloody stools.   Resp:    No chest wall deformity  Skin: no rash or lesions.  GU: no dysuria, change in color of urine, no urgency or frequency.  No flank pain, no hematuria   MS:  No joint pain or swelling.  No decreased range of motion.  No back pain.    Physical Exam  BP (!) 148/86 (BP Location: Left Arm, Cuff Size: Large)   Pulse 99   Temp 99 F (37.2 C) (Oral)   Ht 5\' 3"  (1.6 m)   SpO2 98%   GEN: A/Ox3; pleasant , NAD, obese    HEENT:  [place]/AT,  EACs-clear, TMs-wnl, NOSE-clear drainage , THROAT-clear, no lesions, no postnasal drip or exudate noted.   NECK:  Supple w/ fair ROM; no JVD; normal carotid impulses w/o bruits; no thyromegaly or nodules palpated; no lymphadenopathy.    RESP  Few trace wheezes , speaking in full sentences . no accessory muscle use, no dullness to percussion  CARD:  RRR, no m/r/g, no peripheral edema, pulses intact, no cyanosis or clubbing.  GI:   Soft & nt; nml bowel sounds; no organomegaly or masses detected.   Musco: Warm bil, no deformities or joint swelling noted.   Neuro: alert, no focal deficits noted.    Skin: Warm, no lesions or rashes  Psych:  No change in mood or affect. No depression or anxiety.  No memory loss.  Lab Results:  CBC    Component Value  Date/Time   WBC 7.4 10/27/2015 0938   RBC 4.57 10/27/2015 0938   HGB 12.1 10/27/2015 0938   HCT 36.6 10/27/2015 0938   PLT 284.0 10/27/2015 0938   MCV 80.2 10/27/2015 0938   MCH 26.5 08/13/2014 1400   MCHC 32.9 10/27/2015 0938   RDW 14.4 10/27/2015 0938   LYMPHSABS 2.6 10/27/2015 0938   MONOABS 0.5 10/27/2015 0938   EOSABS 0.3 10/27/2015 0938   BASOSABS 0.0 10/27/2015  [id number]    BMET    Component Value Date/Time   NA 140 08/13/2014 1400   K 3.7 08/13/2014 1400   CL 104 08/13/2014 1400   CO2 29 08/13/2014 1400   GLUCOSE 97 08/13/2014 1400   BUN 15 08/13/2014 1400   CREATININE 0.97 08/13/2014 1400   CALCIUM 9.2 08/13/2014 1400   GFRNONAA 68 (L) 08/13/2014 1400   GFRAA 79 (L) 08/13/2014 1400    BNP No results found for: BNP  ProBNP    Component Value Date/Time   PROBNP <30.0 04/30/2009 2135    Imaging: No results found.   Assessment & Plan:   No problem-specific Assessment & Plan notes found for this encounter.     Rexene Edison, NP 05/02/2016

## 2016-05-02 NOTE — Assessment & Plan Note (Addendum)
Flare with bronchitis /URI  Check cxr today  Depo Medrol 120mg  IM x 1  Xopenex neb x 1    Plan  Patient Instructions  Augmentin 875mg  Twice daily  For 7 days , take w/ food .  Mucinex DM Twice daily  As needed  Cough/congestion  Prednisone taper over next week.  Fluids and rest .  Chest xray today .  follow up Dr. Annamaria Boots  Next week as planned and As needed   Please contact office for sooner follow up if symptoms do not improve or worsen or seek emergency care    LATE ADD : PT CALL, BELIEVES SHE NEEDS ADMISSION , ASTHMA WORSE WITH COUGHING FITS IN XRAY , FORGOT TO TELL ME SHE HAD [facility].   [facility] direct admit called, waiting on bed. Once bed available will admit to [facility] .

## 2016-05-02 NOTE — Telephone Encounter (Signed)
Pt states she is calling TP to let her know that she is being admitted to [facility]- the room number listed below has changed and pt does not know current room placement.  Pt states TP had asked her to call when she was being admitted.  Forwarding to TP as FYI.

## 2016-05-02 NOTE — H&P (Signed)
PULMONARY / CRITICAL CARE MEDICINE   Name: [name] MRN: [id number] DOB: 02/22/1966    ADMISSION DATE:  05/02/2016 CONSULTATION DATE:  05/02/2016  REFERRING MD:  Dr. Annamaria Boots  CHIEF COMPLAINT:  Shortness of breath  HISTORY OF PRESENT ILLNESS:   Pleasant 51 year old female with a past medical history significant for asthma presented to the pulmonary clinic on 05/02/2016 complaining of 3 days of shortness of breath with some body aches, postnasal drip, scratchy throat and dry cough. She's been taking albuterol every 4 hours at home as well as Symbicort but continues to have shortness of breath. She was noted to have fever so is recommended that she be admitted for rule out flu and assessment for community-acquired pneumonia. She denies leg swelling, rash, joint aches. She has not had nausea vomiting or diarrhea.  PAST MEDICAL HISTORY :  She  has a past medical history of Allergic rhinitis; Asthma; Dysphagia; Esophageal dilatation; GERD (gastroesophageal reflux disease); Headache; Hypertension in pregnancy; Meniscal injury; Morbid obesity with body mass index of 45.0-49.9 in adult [facility]); Pseudotumor cerebri; and Steroid-induced hyperglycemia (05/08/2013).  PAST SURGICAL HISTORY: She  has a past surgical history that includes Knee surgery (09/02/2008); Tonsillectomy; Uterine Ablation; Knee surgery (2011); Abdominal hysterectomy; and Knee arthroscopy (Left, 08/23/2014).  Allergies  Allergen Reactions  . Influenza A (H1n1) Monoval Vac     Per pt report  . Omalizumab     Arvid Right* REACTION: angioedema-looses airway  . Promethazine Hcl     REACTION: hallucinations    No current facility-administered medications on file prior to encounter.    Current Outpatient Prescriptions on File Prior to Encounter  Medication Sig  . albuterol (PROVENTIL) (2.5 MG/3ML) 0.083% nebulizer solution USE 1 VIAL BY NEBULIZER EVERY 6 HOURS AS NEEDED FOR WHEEZING OR SHORTNESS OF BREATH  . amoxicillin-clavulanate  (AUGMENTIN) 875-125 MG tablet Take 1 tablet by mouth 2 (two) times daily.  . Azelastine-Fluticasone (DYMISTA) 137-50 MCG/ACT SUSP One puff twice daily ea nostril  . budesonide-formoterol (SYMBICORT) 160-4.5 MCG/ACT inhaler Inhale 2 puffs into the lungs 2 (two) times daily. Rinse mouth  . cyclobenzaprine (FLEXERIL) 5 MG tablet Take 1 tablet (5 mg total) by mouth 3 (three) times daily as needed for muscle spasms (or migraines).  Marland Kitchen econazole nitrate 1 % cream As needed  . EPINEPHrine 0.3 mg/0.3 mL IJ SOAJ injection Inject into thigh for severe asthma/ allergicreaction  . furosemide (LASIX) 40 MG tablet TAKE 1 TABLET BY MOUTH ONCE DAILY AS NEEDED  . ibuprofen (ADVIL,MOTRIN) 800 MG tablet Take 1 tablet by mouth daily as needed.  Marland Kitchen levocetirizine (XYZAL) 5 MG tablet Take 1 tablet (5 mg total) by mouth every evening.  Marland Kitchen LORazepam (ATIVAN) 1 MG tablet TAKE 1 TABLET BY MOUTH AT BEDTIME AS NEEDED.  Marland Kitchen meloxicam (MOBIC) 15 MG tablet Take 1 tablet (15 mg total) by mouth daily.  Marland Kitchen oxyCODONE (OXY IR/ROXICODONE) 5 MG immediate release tablet Take 1-2 tablets (5-10 mg total) by mouth every 4 (four) hours as needed for moderate pain or severe pain.  . pantoprazole (PROTONIX) 40 MG tablet Twice daily before meal (Patient taking differently: Take 40 mg by mouth 2 (two) times daily. )  . PATANOL 0.1 % ophthalmic solution INSTILL 1 DROP INTO BOTH EYES TWO TIMES DAILY AS NEEDED FOR ALLERGIES  . predniSONE (DELTASONE) 10 MG tablet 4 tabs for 2 days, then 3 tabs for 2 days, 2 tabs for 2 days, then 1 tab for 2 days, then stop  . Spacer/Aero-Holding Chambers (AEROCHAMBER MV) inhaler Use  as instructed  . traMADol (ULTRAM) 50 MG tablet TAKE 1 TABLET BY MOUTH EVERY 4 HOURS AS NEEDED FOR PAIN  . TRANSDERM-SCOP, 1.5 MG, 1 MG/3DAYS APPLY 1 PATCH ONTO THE SKIN EVERY 3 DAYS.  Cathie Hoops ER 10-8 MG/5ML SUER 5 ml every 12 hours if needed for cough  . urea (CARMOL) 40 % CREA As needed  . VENTOLIN HFA 108 (90 Base)  MCG/ACT inhaler INHALE 2 PUFFS BY MOUTH INTO THE LUNGS EVERY 6 HOURS AS NEEDED FOR WHEEZING    FAMILY HISTORY:  Her indicated that the status of her mother is unknown. She indicated that the status of her father is unknown. She indicated that the status of her maternal grandmother is unknown. She indicated that the status of her maternal aunt is unknown.    SOCIAL HISTORY: She  reports that she has never smoked. She has never used smokeless tobacco. She reports that she drinks alcohol. She reports that she does not use drugs.  REVIEW OF SYSTEMS:   Gen: + fever, + chills, weight change, + fatigue, night sweats HEENT: Denies blurred vision, double vision, hearing loss, tinnitus, sinus congestion, rhinorrhea, sore throat, neck stiffness, dysphagia PULM: per HPI CV: Denies chest pain, edema, orthopnea, paroxysmal nocturnal dyspnea, palpitations GI: Denies abdominal pain, nausea, vomiting, diarrhea, hematochezia, melena, constipation, change in bowel habits GU: Denies dysuria, hematuria, polyuria, oliguria, urethral discharge Endocrine: Denies hot or cold intolerance, polyuria, polyphagia or appetite change Derm: Denies rash, dry skin, scaling or peeling skin change Heme: Denies easy bruising, bleeding, bleeding gums Neuro: Denies headache, numbness, weakness, slurred speech, loss of memory or consciousness   SUBJECTIVE:  As above  VITAL SIGNS: BP (!) 156/127 (BP Location: Left Arm)   Pulse (!) 125   Temp (!) 102.8 F (39.3 C) (Oral)   Resp 20   SpO2 95%   HEMODYNAMICS:    VENTILATOR SETTINGS:    INTAKE / OUTPUT: No intake/output data recorded.  PHYSICAL EXAMINATION: General:  Obese female, mild respiratory distress Neuro:  Awake, alert, oriented 4 HEENT:  Normocephalic/atraumatic, oropharynx clear Cardiovascular:  Tachycardic, regular rate and rhythm Lungs:  Few wheezes bilaterally, normally or movement, frequent cough Abdomen:  Bowel sounds positive nontender  nondistended Musculoskeletal:  Normal bulk and tone Skin:  No rash or skin breakdown, no edema  LABS:  BMET No results for input(s): NA, K, CL, CO2, BUN, CREATININE, GLUCOSE in the last 168 hours.  Electrolytes No results for input(s): CALCIUM, MG, PHOS in the last 168 hours.  CBC No results for input(s): WBC, HGB, HCT, PLT in the last 168 hours.  Coag's No results for input(s): APTT, INR in the last 168 hours.  Sepsis Markers No results for input(s): LATICACIDVEN, PROCALCITON, O2SATVEN in the last 168 hours.  ABG No results for input(s): PHART, PCO2ART, PO2ART in the last 168 hours.  Liver Enzymes No results for input(s): AST, ALT, ALKPHOS, BILITOT, ALBUMIN in the last 168 hours.  Cardiac Enzymes No results for input(s): TROPONINI, PROBNP in the last 168 hours.  Glucose No results for input(s): GLUCAP in the last 168 hours.  Imaging Dg Chest 2 View  Result Date: 05/02/2016 CLINICAL DATA:  Asthma with acute exacerbation EXAM: CHEST  2 VIEW COMPARISON:  01/26/2016 FINDINGS: Enlargement of cardiac silhouette. Mediastinal contours and pulmonary vascularity normal. New atelectasis or infiltrate in medial RIGHT lower lobe question new infiltrate. Minimal atelectasis LEFT base. Upper lungs clear. No pleural effusion or pneumothorax. Dextroconvex thoracolumbar scoliosis. IMPRESSION: Minimal LEFT basilar atelectasis with new question  atelectasis versus consolidation in medial RIGHT lower lobe. Electronically Signed   By: Lavonia Dana M.D.   On: 05/02/2016 14:41     STUDIES:  Chest x-ray from 05/02/2016 images personally reviewed showing a questionable right middle lobe versus right lower lobe infiltrate otherwise clear  CULTURES: 05/02/2016 blood culture 05/02/2016 respiratory culture 05/02/2016 respiratory viral panel  ANTIBIOTICS: 05/02/2016 ceftriaxone 05/02/2016 azithromycin  SIGNIFICANT EVENTS:   LINES/TUBES:   DISCUSSION: 51 year old female with a past medical  history significant for asthma is being admitted for acute shortness of breath in the setting of possible community-acquired pneumonia given what appears to be an early infiltrate in the right middle lobe. The differential diagnosis includes acute influenza leading to an asthma exacerbation. She's currently hemodynamically stable but requires admission for further management because of shortness of breath in the setting of asthma.  ASSESSMENT / PLAN:  PULMONARY A: Community-acquired pneumonia Asthma exacerbation Dyspnea P:   Solu-Medrol 40 IV every 12 DuoNeb every 6hours Albuterol when necessary Monitor O2 saturation Repeat chest x-ray in a.m. To evaluate right-sided infiltrate Tussionex prn  CARDIOVASCULAR A:  Sinus tachycardia Fever Sepsis > may just be SIRS with influenza but with infiltrate will treat as CAP/sepsis Hypertensive P:  Treat infection Monitor vital signs closely Sepsis order set: 30cc/kg ideal body weight now Lactic acid now Antibiotics now  RENAL A:   No acute issues P:   Monitor BMET and UOP Replace electrolytes as needed   GASTROINTESTINAL A:   Likely GERD P:   PPI Regular diet  HEMATOLOGIC A:   No acute issues P:  Monitor for bleeding  INFECTIOUS A:   Community-acquired pneumonia Flulike illness P:   Follow-up influenza panel/viral panel Treat community aquired pneumonia with antibiotics as above Follow-up culture   ENDOCRINE A:   DM2   P:   SSI, long acting insulin, check accuchecs  NEUROLOGIC A:   Chronic pain P:   Continue home muscle relaxants and opiates Monitor carefully with tussionex for cough and oxycodone for pain   FAMILY  - Updates: boyfriend updated bedside  Roselie Awkward, MD [place] [facility] Pager: [phone] Cell: [phone] After 3pm or if no response, call [phone]   05/02/2016, 5:09 PM

## 2016-05-02 NOTE — Patient Instructions (Signed)
Augmentin 875mg  Twice daily  For 7 days , take w/ food .  Mucinex DM Twice daily  As needed  Cough/congestion  Prednisone taper over next week.  Fluids and rest .  Chest xray today .  follow up Dr. Annamaria Boots  Next week as planned and As needed   Please contact office for sooner follow up if symptoms do not improve or worsen or seek emergency care

## 2016-05-03 DIAGNOSIS — B9789 Other viral agents as the cause of diseases classified elsewhere: Secondary | ICD-10-CM

## 2016-05-03 DIAGNOSIS — J0100 Acute maxillary sinusitis, unspecified: Secondary | ICD-10-CM

## 2016-05-03 DIAGNOSIS — J988 Other specified respiratory disorders: Secondary | ICD-10-CM

## 2016-05-03 LAB — BASIC METABOLIC PANEL
Anion gap: 6 (ref 5–15)
BUN: 11 mg/dL (ref 6–20)
CO2: 24 mmol/L (ref 22–32)
Calcium: 8.5 mg/dL — ABNORMAL LOW (ref 8.9–10.3)
Chloride: 103 mmol/L (ref 101–111)
Creatinine, Ser: 1 mg/dL (ref 0.44–1.00)
GFR calc Af Amer: >60 mL/min (ref >60)
GFR calc non Af Amer: >60 mL/min (ref >60)
Glucose, Bld: 177 mg/dL — ABNORMAL HIGH (ref 65–99)
Potassium: 4.2 mmol/L (ref 3.5–5.1)
Sodium: 133 mmol/L — ABNORMAL LOW (ref 135–145)

## 2016-05-03 LAB — RESPIRATORY PANEL BY PCR

## 2016-05-03 LAB — BLOOD CULTURE ID PANEL (REFLEXED)

## 2016-05-03 LAB — GLUCOSE, CAPILLARY
Glucose-Capillary: 137 mg/dL — ABNORMAL HIGH (ref 65–99)
Glucose-Capillary: 204 mg/dL — ABNORMAL HIGH (ref 65–99)
Glucose-Capillary: 175 mg/dL — ABNORMAL HIGH (ref 65–99)
Glucose-Capillary: 119 mg/dL — ABNORMAL HIGH (ref 65–99)

## 2016-05-03 LAB — CBC
HCT: 35.6 % — ABNORMAL LOW (ref 36.0–46.0)
Hemoglobin: 11.4 g/dL — ABNORMAL LOW (ref 12.0–15.0)
MCH: 26.3 pg (ref 26.0–34.0)
MCHC: 32 g/dL (ref 30.0–36.0)
MCV: 82 fL (ref 78.0–100.0)
Platelets: 293 10*3/uL (ref 150–400)
RBC: 4.34 MIL/uL (ref 3.87–5.11)
RDW: 14.2 % (ref 11.5–15.5)
WBC: 5.9 10*3/uL (ref 4.0–10.5)

## 2016-05-03 LAB — MAGNESIUM: Magnesium: 1.8 mg/dL (ref 1.7–2.4)

## 2016-05-03 LAB — EXPECTORATED SPUTUM ASSESSMENT W GRAM STAIN, RFLX TO RESP C

## 2016-05-03 MED ORDER — AZITHROMYCIN 250 MG PO TABS
500.0000 mg | ORAL_TABLET | Freq: Every day | ORAL | Status: DC
  Administered 2016-05-03: 500 mg via ORAL
  Filled 2016-05-03: qty 2

## 2016-05-03 MED ORDER — OSELTAMIVIR PHOSPHATE 75 MG PO CAPS
75.0000 mg | ORAL_CAPSULE | Freq: Two times a day (BID) | ORAL | Status: DC
  Administered 2016-05-03 (×2): 75 mg via ORAL
  Filled 2016-05-03 (×3): qty 1

## 2016-05-03 MED ORDER — BUDESONIDE 0.5 MG/2ML IN SUSP
0.5000 mg | Freq: Two times a day (BID) | RESPIRATORY_TRACT | Status: DC
  Administered 2016-05-03 – 2016-05-09 (×13): 0.5 mg via RESPIRATORY_TRACT
  Filled 2016-05-03 (×13): qty 2

## 2016-05-03 MED ORDER — ARFORMOTEROL TARTRATE 15 MCG/2ML IN NEBU
15.0000 ug | INHALATION_SOLUTION | Freq: Two times a day (BID) | RESPIRATORY_TRACT | Status: DC
  Administered 2016-05-03 – 2016-05-09 (×13): 15 ug via RESPIRATORY_TRACT
  Filled 2016-05-03 (×13): qty 2

## 2016-05-03 MED ORDER — IPRATROPIUM-ALBUTEROL 0.5-2.5 (3) MG/3ML IN SOLN
3.0000 mL | Freq: Four times a day (QID) | RESPIRATORY_TRACT | Status: DC
  Administered 2016-05-03 (×3): 3 mL via RESPIRATORY_TRACT
  Filled 2016-05-03 (×2): qty 3

## 2016-05-03 MED ORDER — METHYLPREDNISOLONE SODIUM SUCC 40 MG IJ SOLR
40.0000 mg | Freq: Three times a day (TID) | INTRAMUSCULAR | Status: DC
  Administered 2016-05-03 – 2016-05-07 (×12): 40 mg via INTRAVENOUS
  Filled 2016-05-03 (×13): qty 1

## 2016-05-03 MED ORDER — CEFAZOLIN SODIUM-DEXTROSE 2-4 GM/100ML-% IV SOLN
2.0000 g | Freq: Three times a day (TID) | INTRAVENOUS | Status: DC
  Administered 2016-05-03 – 2016-05-07 (×11): 2 g via INTRAVENOUS
  Filled 2016-05-03 (×13): qty 100

## 2016-05-03 MED ORDER — IPRATROPIUM-ALBUTEROL 0.5-2.5 (3) MG/3ML IN SOLN
3.0000 mL | RESPIRATORY_TRACT | Status: DC
  Administered 2016-05-04 – 2016-05-06 (×15): 3 mL via RESPIRATORY_TRACT
  Filled 2016-05-03 (×18): qty 3

## 2016-05-03 NOTE — Progress Notes (Signed)
PCCM Progress Note  Admission date: 05/02/2016  CC: Short of breath  HPI: 51 yo female presented to pulmonary office with 3 days of dyspnea, body aches, post nasal drip, sore throat, fever 104 with concern for influenza causing acute asthma exacerbation.  Her symptoms started after she worked outside in [employer].  PMHx of Pseudotumor cerebri, GERD  Subjective: Breathing better, but still feels like she is congested in wheezing.  Still has sinus congestion.  Throat feels better.  Feels like nebulizer working better than inhaler.  Tm 102.8.  Vital signs: BP (!) 171/90 (BP Location: Right Arm)   Pulse 86   Temp 98.7 F (37.1 C) (Oral)   Resp 18   Ht 5\' 3"  (1.6 m)   Wt 240 lb (108.9 kg)   SpO2 95%   BMI 42.51 kg/m   Intake/output: I/O last 3 completed shifts: In: 3800 [IV Piggyback:3800] Out: 700 [Urine:700]   General: pleasant, mild increase WOB, speaks in full sentences, frequent coughing spells Neuro: normal strength HEENT: no stridor, mild tenderness maxillary sinuses b/l Cardiac: regular, no murmur Chest: faint b/l wheeze with scattered rhonchi Abd: soft, non tender Ext: no edema Skin: no rashes   CMP Latest Ref Rng & Units 05/03/2016 05/02/2016 08/13/2014  Glucose 65 - 99 mg/dL 177(H) 91 97  BUN 6 - 20 mg/dL 11 10 15   Creatinine 0.44 - 1.00 mg/dL 1.00 1.27(H) 0.97  Sodium 135 - 145 mmol/L 133(L) 134(L) 140  Potassium 3.5 - 5.1 mmol/L 4.2 3.7 3.7  Chloride 101 - 111 mmol/L 103 100(L) 104  CO2 22 - 32 mmol/L 24 25 29   Calcium 8.9 - 10.3 mg/dL 8.5(L) 8.6(L) 9.2  Total Protein 6.5 - 8.1 g/dL - 7.2 -  Total Bilirubin 0.3 - 1.2 mg/dL - 0.4 -  Alkaline Phos 38 - 126 U/L - 64 -  AST 15 - 41 U/L - 26 -  ALT 14 - 54 U/L - 25 -     CBC Latest Ref Rng & Units 05/03/2016 05/02/2016 10/27/2015  WBC 4.0 - 10.5 K/uL 5.9 6.9 7.4  Hemoglobin 12.0 - 15.0 g/dL 11.4(L) 11.9(L) 12.1  Hematocrit 36.0 - 46.0 % 35.6(L) 36.8 36.6  Platelets 150 - 400 K/uL 293 247 284.0      ABG    Component Value Date/Time   PHART 7.394 05/04/2007 0035   PCO2ART 43.0 05/04/2007 0035   PO2ART 75.2 (L) 05/04/2007 0035   HCO3 25.7 (H) 05/04/2007 0035   TCO2 25 08/29/2011 0005   O2SAT 95.1 05/04/2007 0035     CBG (last 3)   Recent Labs  05/02/16 2322 05/03/16 0750  GLUCAP 156* 137*     Imaging: Dg Chest 2 View  Result Date: 05/02/2016 CLINICAL DATA:  Asthma with acute exacerbation EXAM: CHEST  2 VIEW COMPARISON:  01/26/2016 FINDINGS: Enlargement of cardiac silhouette. Mediastinal contours and pulmonary vascularity normal. New atelectasis or infiltrate in medial RIGHT lower lobe question new infiltrate. Minimal atelectasis LEFT base. Upper lungs clear. No pleural effusion or pneumothorax. Dextroconvex thoracolumbar scoliosis. IMPRESSION: Minimal LEFT basilar atelectasis with new question atelectasis versus consolidation in medial RIGHT lower lobe. Electronically Signed   By: Lavonia Dana M.D.   On: 05/02/2016 14:41    Antibiotics: Rocephin 1/10 >> 1/11 Zithromax 1/10 >>  Cultures: Urine 1/10 >> Respiratory viral panel 1/10 >> Blood 1/10 >>   Lines/tubes:  Events: 1/10 Admit  Summary: 51 yo female with acute asthmatic bronchitis likely from environmental exposure and viral respiratory infection.  Assessment/plan:  Acute asthmatic bronchitis. - adjust solumedrol - change to brovana, pulmicort and continue duoneb with prn albuterol  Viral respiratory infection. - negative procalcitonin speaks against bacterial infection - add tamiflu pending viral respiratory panel - d/c rocephin and change zithromax to po >> likely can d/c all Abx soon  Allergic rhinitis with acute viral sinusitis. - continue flonase, claritin  Steroid induced hyperglycemia. - SSI while on steroids  Hx of GERD. - continue protonix  Elevated blood pressure. - no hx of HTN - might need tx if this continues to be elevated  DVT prophylaxis - SQ heparin SUP -  Protonix Nutrition - carb modified diet Goals of care - Full code  Chesley Mires, MD [place] 05/03/2016, 8:56 AM Pager:  [phone] After 3pm call: [phone]

## 2016-05-03 NOTE — Progress Notes (Signed)
Pt short of breath, respirations increased. Resp in room for breathing treatment. Meds given per orders. Pt feels better. Will watch closely. Pt discussed BIPAP at HS with RT and agreeable. Will contact MD for order. Discussed with pt importance of alerting nurse if anything changes or she starts feeling worse and pt agrees. Will continue to monitor.

## 2016-05-03 NOTE — Progress Notes (Signed)
PHARMACY - PHYSICIAN COMMUNICATION CRITICAL VALUE ALERT - BLOOD CULTURE IDENTIFICATION (BCID)  Results for orders placed or performed during the hospital encounter of 05/02/16  Blood Culture ID Panel (Reflexed) (Collected: 05/02/2016  5:47 PM)  Result Value Ref Range   Enterococcus species NOT DETECTED NOT DETECTED   Listeria monocytogenes NOT DETECTED NOT DETECTED   Staphylococcus species DETECTED (A) NOT DETECTED   Staphylococcus aureus NOT DETECTED NOT DETECTED   Methicillin resistance NOT DETECTED NOT DETECTED   Streptococcus species NOT DETECTED NOT DETECTED   Streptococcus agalactiae NOT DETECTED NOT DETECTED   Streptococcus pneumoniae NOT DETECTED NOT DETECTED   Streptococcus pyogenes NOT DETECTED NOT DETECTED   Acinetobacter baumannii NOT DETECTED NOT DETECTED   Enterobacteriaceae species NOT DETECTED NOT DETECTED   Enterobacter cloacae complex NOT DETECTED NOT DETECTED   Escherichia coli NOT DETECTED NOT DETECTED   Klebsiella oxytoca NOT DETECTED NOT DETECTED   Klebsiella pneumoniae NOT DETECTED NOT DETECTED   Proteus species NOT DETECTED NOT DETECTED   Serratia marcescens NOT DETECTED NOT DETECTED   Haemophilus influenzae NOT DETECTED NOT DETECTED   Neisseria meningitidis NOT DETECTED NOT DETECTED   Pseudomonas aeruginosa NOT DETECTED NOT DETECTED   Candida albicans NOT DETECTED NOT DETECTED   Candida glabrata NOT DETECTED NOT DETECTED   Candida krusei NOT DETECTED NOT DETECTED   Candida parapsilosis NOT DETECTED NOT DETECTED   Candida tropicalis NOT DETECTED NOT DETECTED    Name of physician (or Provider) Contacted: Dr. Oletta Darter  Changes to prescribed antibiotics required: start ancef  Lynelle Doctor 05/03/2016  8:52 PM

## 2016-05-04 DIAGNOSIS — J121 Respiratory syncytial virus pneumonia: Secondary | ICD-10-CM

## 2016-05-04 LAB — GLUCOSE, CAPILLARY
Glucose-Capillary: 168 mg/dL — ABNORMAL HIGH (ref 65–99)
Glucose-Capillary: 147 mg/dL — ABNORMAL HIGH (ref 65–99)
Glucose-Capillary: 146 mg/dL — ABNORMAL HIGH (ref 65–99)
Glucose-Capillary: 179 mg/dL — ABNORMAL HIGH (ref 65–99)

## 2016-05-04 LAB — URINE CULTURE

## 2016-05-04 MED ORDER — ALBUTEROL (5 MG/ML) CONTINUOUS INHALATION SOLN
10.0000 mg/h | INHALATION_SOLUTION | RESPIRATORY_TRACT | Status: DC
  Administered 2016-05-04: 10 mg/h via RESPIRATORY_TRACT
  Filled 2016-05-04: qty 20

## 2016-05-04 MED ORDER — AMLODIPINE BESYLATE 5 MG PO TABS
5.0000 mg | ORAL_TABLET | Freq: Every day | ORAL | Status: DC
  Administered 2016-05-05 – 2016-05-06 (×2): 5 mg via ORAL
  Filled 2016-05-04 (×3): qty 1

## 2016-05-04 NOTE — Progress Notes (Signed)
Pt given nebulizer, RT at bedside. Pt states she feels better. Discussed going down to ICU for closer monitoring and continuous neb but pt does not want to at this time. RT discussed doing tele with bedside neb and will call MD to discuss. Will c/t monitor.

## 2016-05-04 NOTE — Consult Note (Signed)
   [facility] CM Inpatient Consult   05/04/2016  [name] November 20, 1965 [id number]   Came to visit [name] at bedside to discuss Link to [facility] Management program and [employer] program for [employer] employees/dependents with [employer]. Confirmed best contact number for [name] as [phone] for post discharge call. Appreciative of visit.   Marthenia Rolling, MSN-Ed, RN,BSN [facility] Liaison [phone]

## 2016-05-04 NOTE — Progress Notes (Signed)
Attempted to recheck BP since BP was elevated earlier when pt was having SOB episode. Unable to obtain because pt starting coughing. Respirations increased, cough is tight, non-productive. RT called and on way. O2 at 2L per [place]. Duo neb breathing treatment given per schedule. Waiting for RT. Husband at beside. Encouraged pt to try to relax. Will monitor.

## 2016-05-04 NOTE — Progress Notes (Signed)
Pt feeling better. Is resting more comfortably. Husband at bedside. Will continue to monitor.

## 2016-05-04 NOTE — Progress Notes (Signed)
Pt seen by Pulmonary. States she is feeling better. Lung sounds have improved. Husband at bedside. Pt encouraged to call if any changes in breathing and pt and husband verbalized understanding. RT to set up continuous nebulizer. Will continue to monitor.

## 2016-05-04 NOTE — Progress Notes (Signed)
PCCM Progress Note  Admission date: 05/02/2016  CC: Short of breath  HPI: 51 yo female presented to pulmonary office with 3 days of dyspnea, body aches, post nasal drip, sore throat, fever 104 with concern for influenza causing acute asthma exacerbation.  Her symptoms started after she worked outside in [employer].  PMHx of Pseudotumor cerebri, GERD  Subjective: She had trouble with her breathing last night >> better after getting neb tx.  She was started on Ancef for Staph species in blood culture.  She still has sinus congestion and cough.  She gets winded walking in her room.  Vital signs: BP (!) 172/81 (BP Location: Right Arm)   Pulse (!) 113   Temp 98.6 F (37 C) (Oral)   Resp 17   Ht 5\' 3"  (1.6 m)   Wt 240 lb (108.9 kg)   SpO2 96%   BMI 42.51 kg/m   Intake/output: I/O last 3 completed shifts: In: 4580 [P.O.:340; I.V.:240; IV Piggyback:4000] Out: 3200 [Urine:3200]  General: pleasant, sitting at side of bed Eyes: pupils reactive ENT: no stridor Cardiac: regular, no murmur Chest: better air movement, faint wheeze Abd: soft, non tender Ext: no edema Skin: no rashes   CMP Latest Ref Rng & Units 05/03/2016 05/02/2016 08/13/2014  Glucose 65 - 99 mg/dL 177(H) 91 97  BUN 6 - 20 mg/dL 11 10 15   Creatinine 0.44 - 1.00 mg/dL 1.00 1.27(H) 0.97  Sodium 135 - 145 mmol/L 133(L) 134(L) 140  Potassium 3.5 - 5.1 mmol/L 4.2 3.7 3.7  Chloride 101 - 111 mmol/L 103 100(L) 104  CO2 22 - 32 mmol/L 24 25 29   Calcium 8.9 - 10.3 mg/dL 8.5(L) 8.6(L) 9.2  Total Protein 6.5 - 8.1 g/dL - 7.2 -  Total Bilirubin 0.3 - 1.2 mg/dL - 0.4 -  Alkaline Phos 38 - 126 U/L - 64 -  AST 15 - 41 U/L - 26 -  ALT 14 - 54 U/L - 25 -     CBC Latest Ref Rng & Units 05/03/2016 05/02/2016 10/27/2015  WBC 4.0 - 10.5 K/uL 5.9 6.9 7.4  Hemoglobin 12.0 - 15.0 g/dL 11.4(L) 11.9(L) 12.1  Hematocrit 36.0 - 46.0 % 35.6(L) 36.8 36.6  Platelets 150 - 400 K/uL 293 247 284.0     ABG    Component Value  Date/Time   PHART 7.394 05/04/2007 0035   PCO2ART 43.0 05/04/2007 0035   PO2ART 75.2 (L) 05/04/2007 0035   HCO3 25.7 (H) 05/04/2007 0035   TCO2 25 08/29/2011 0005   O2SAT 95.1 05/04/2007 0035     CBG (last 3)   Recent Labs  05/03/16 1817 05/03/16 2146 05/04/16 0811  GLUCAP 175* 119* 168*     Imaging: Dg Chest 2 View  Result Date: 05/02/2016 CLINICAL DATA:  Asthma with acute exacerbation EXAM: CHEST  2 VIEW COMPARISON:  01/26/2016 FINDINGS: Enlargement of cardiac silhouette. Mediastinal contours and pulmonary vascularity normal. New atelectasis or infiltrate in medial RIGHT lower lobe question new infiltrate. Minimal atelectasis LEFT base. Upper lungs clear. No pleural effusion or pneumothorax. Dextroconvex thoracolumbar scoliosis. IMPRESSION: Minimal LEFT basilar atelectasis with new question atelectasis versus consolidation in medial RIGHT lower lobe. Electronically Signed   By: Lavonia Dana M.D.   On: 05/02/2016 14:41    Antibiotics: Rocephin 1/10 >> 1/11 Zithromax 1/10 >> 1/12 Ancef 1/10 >>  Cultures: Urine 1/10 >> Respiratory viral panel 1/10 >> Respiratory syncytial virus Blood 1/10 >> Staph species >>   Summary: 51 yo female with acute asthmatic bronchitis likely from  environmental exposure and viral respiratory infection.  Assessment/plan:  Acute asthmatic bronchitis. - continue solumedrol 40 mg q8h - continue brovana, pulmicort, duoneb and prn albuterol  Viral respiratory infection >> respiratory syncytial virus positive. - negative procalcitonin speaks against bacterial infection - d/c tamiflu since flu negative  Allergic rhinitis with acute viral sinusitis. - continue flonase, claritin  Staph species in blood culture 05/02/16. - likely contaminate - continue Ancef pending final culture results  Steroid induced hyperglycemia. - SSI while on steroids  Hx of GERD. - continue protonix  Elevated blood pressure. - no hx of HTN - add norvasc  1/12  DVT prophylaxis - SQ heparin SUP - Protonix Nutrition - carb modified diet Goals of care - Full code  Updated pt's family at bedside.   Chesley Mires, MD [facility] Pulmonary/Critical Care 05/04/2016, 9:23 AM Pager:  [phone] After 3pm call: [phone]

## 2016-05-05 DIAGNOSIS — B370 Candidal stomatitis: Secondary | ICD-10-CM

## 2016-05-05 DIAGNOSIS — I10 Essential (primary) hypertension: Secondary | ICD-10-CM

## 2016-05-05 DIAGNOSIS — R739 Hyperglycemia, unspecified: Secondary | ICD-10-CM

## 2016-05-05 LAB — CULTURE, RESPIRATORY W GRAM STAIN: Culture: NORMAL

## 2016-05-05 LAB — GLUCOSE, CAPILLARY
Glucose-Capillary: 146 mg/dL — ABNORMAL HIGH (ref 65–99)
Glucose-Capillary: 136 mg/dL — ABNORMAL HIGH (ref 65–99)
Glucose-Capillary: 144 mg/dL — ABNORMAL HIGH (ref 65–99)
Glucose-Capillary: 163 mg/dL — ABNORMAL HIGH (ref 65–99)

## 2016-05-05 LAB — CULTURE, BLOOD (ROUTINE X 2)

## 2016-05-05 MED ORDER — FUROSEMIDE 20 MG PO TABS
20.0000 mg | ORAL_TABLET | Freq: Every day | ORAL | Status: DC
  Administered 2016-05-05 – 2016-05-06 (×2): 20 mg via ORAL
  Filled 2016-05-05 (×2): qty 1

## 2016-05-05 MED ORDER — HYDRALAZINE HCL 20 MG/ML IJ SOLN
10.0000 mg | INTRAMUSCULAR | Status: DC | PRN
  Administered 2016-05-05 (×2): 10 mg via INTRAVENOUS
  Filled 2016-05-05 (×2): qty 1

## 2016-05-05 MED ORDER — FLUCONAZOLE 150 MG PO TABS
150.0000 mg | ORAL_TABLET | Freq: Every day | ORAL | Status: DC
  Administered 2016-05-05 – 2016-05-08 (×4): 150 mg via ORAL
  Filled 2016-05-05 (×4): qty 1

## 2016-05-05 MED ORDER — ALBUTEROL (5 MG/ML) CONTINUOUS INHALATION SOLN
10.0000 mg/h | INHALATION_SOLUTION | RESPIRATORY_TRACT | Status: DC
  Administered 2016-05-05: 10 mg/h via RESPIRATORY_TRACT
  Filled 2016-05-05: qty 20

## 2016-05-05 NOTE — Progress Notes (Signed)
[facility] Progress Note Patient Name: [name] DOB: Sep 22, 1965 MRN: [id number]   Date of Service  05/05/2016  HPI/Events of Note  Nurse reporting hypertension with systolic blood pressure greater than 200. Previously refused Norvasc.   eICU Interventions  Hydralazine 10 mg IV as needed for systolic blood pressure [id number]      Intervention Category Intermediate Interventions: Hypertension - evaluation and management  Tera Partridge 05/05/2016, 6:35 AM

## 2016-05-05 NOTE — Progress Notes (Signed)
PCCM Progress Note  Admission date: 05/02/2016  CC: Short of breath  HPI: 51 yo female Kerr presented to pulmonary office with 3 days of dyspnea, body aches, post nasal drip, sore throat, fever 104 with concern for influenza causing acute asthma exacerbation.  Her symptoms started after she worked outside in [employer].  PMHx of Pseudotumor cerebri, GERD  Subjective: She had trouble with her breathing last night >> better after getting neb tx.  She was started on Ancef for Staph species in blood culture.  She still has sinus congestion and cough.  She gets winded walking in her room. Vertex HA today with BP up. She declined Norvasc initially -says only HBP when she is sick.  Vital signs: BP (!) 170/90 (BP Location: Right Arm)   Pulse (!) 111   Temp 98.9 F (37.2 C) (Oral)   Resp 16   Ht 5\' 3"  (1.6 m)   Wt 108.9 kg (240 lb)   SpO2 93%   BMI 42.51 kg/m   Intake/output: I/O last 3 completed shifts: In: 1499.2 [P.O.:620; I.V.:379.2; IV Piggyback:500] Out: 2500 [Urine:2500]  General: pleasant, lying in bed, obese Eyes: pupils reactive, gross vision intact ENT: no stridor. Mild hoarseness, early thrush Cardiac: regular, no murmur Chest: fair air movement, faint wheeze Abd: soft, non tender Ext: no edema Skin: no rashes   CMP Latest Ref Rng & Units 05/03/2016 05/02/2016 08/13/2014  Glucose 65 - 99 mg/dL 177(H) 91 97  BUN 6 - 20 mg/dL 11 10 15   Creatinine 0.44 - 1.00 mg/dL 1.00 1.27(H) 0.97  Sodium 135 - 145 mmol/L 133(L) 134(L) 140  Potassium 3.5 - 5.1 mmol/L 4.2 3.7 3.7  Chloride 101 - 111 mmol/L 103 100(L) 104  CO2 22 - 32 mmol/L 24 25 29   Calcium 8.9 - 10.3 mg/dL 8.5(L) 8.6(L) 9.2  Total Protein 6.5 - 8.1 g/dL - 7.2 -  Total Bilirubin 0.3 - 1.2 mg/dL - 0.4 -  Alkaline Phos 38 - 126 U/L - 64 -  AST 15 - 41 U/L - 26 -  ALT 14 - 54 U/L - 25 -     CBC Latest Ref Rng & Units 05/03/2016 05/02/2016 10/27/2015  WBC 4.0 - 10.5 K/uL 5.9 6.9 7.4  Hemoglobin 12.0 - 15.0  g/dL 11.4(L) 11.9(L) 12.1  Hematocrit 36.0 - 46.0 % 35.6(L) 36.8 36.6  Platelets 150 - 400 K/uL 293 247 284.0     ABG    Component Value Date/Time   PHART 7.394 05/04/2007 0035   PCO2ART 43.0 05/04/2007 0035   PO2ART 75.2 (L) 05/04/2007 0035   HCO3 25.7 (H) 05/04/2007 0035   TCO2 25 08/29/2011 0005   O2SAT 95.1 05/04/2007 0035     CBG (last 3)   Recent Labs  05/04/16 2100 05/05/16 0808 05/05/16 1236  GLUCAP 179* 146* 136*     Imaging: No results found.  Antibiotics: Rocephin 1/10 >> 1/11 Zithromax 1/10 >> 1/12 Ancef 1/10 >>  Cultures: Urine 1/10 >> Respiratory viral panel 1/10 >> Respiratory syncytial virus Blood 1/10 >> Staph species >>   Summary: 51 yo female with acute asthmatic bronchitis likely from environmental exposure and viral respiratory infection. Hypertensive now.  Assessment/plan:  Acute asthmatic bronchitis. - continue solumedrol 40 mg slowing to q 12 hours - continue brovana, pulmicort, duoneb and prn albuterol  Viral respiratory infection >> respiratory syncytial virus positive. - negative procalcitonin speaks against bacterial infection - d/c tamiflu since flu negative  Allergic rhinitis with acute viral sinusitis. - continue flonase, claritin  Staph species in blood culture 05/02/16. - likely contaminate. Does not look toxic. - continue Ancef pending final culture results  Steroid induced hyperglycemia. - SSI while on steroids  Hx of GERD. - continue protonix  Elevated blood pressure. - no hx of HTN - add norvasc 1/12 -add lasix 20   DVT prophylaxis - SQ heparin SUP - Protonix Nutrition - carb modified diet Goals of care - Full code  Updated pt's family at bedside.   CD Annamaria Boots, MD [facility] Pulmonary/Critical Care 05/05/2016, 1:15 PM Pager:  [phone] After 3pm call: [phone]

## 2016-05-06 LAB — GLUCOSE, CAPILLARY
Glucose-Capillary: 161 mg/dL — ABNORMAL HIGH (ref 65–99)
Glucose-Capillary: 196 mg/dL — ABNORMAL HIGH (ref 65–99)
Glucose-Capillary: 207 mg/dL — ABNORMAL HIGH (ref 65–99)
Glucose-Capillary: 184 mg/dL — ABNORMAL HIGH (ref 65–99)

## 2016-05-06 LAB — BASIC METABOLIC PANEL
Anion gap: 8 (ref 5–15)
BUN: 19 mg/dL (ref 6–20)
CO2: 29 mmol/L (ref 22–32)
Calcium: 8.6 mg/dL — ABNORMAL LOW (ref 8.9–10.3)
Chloride: 100 mmol/L — ABNORMAL LOW (ref 101–111)
Creatinine, Ser: 1.18 mg/dL — ABNORMAL HIGH (ref 0.44–1.00)
GFR calc Af Amer: >60 mL/min (ref >60)
GFR calc non Af Amer: 54 mL/min — ABNORMAL LOW (ref >60)
Glucose, Bld: 158 mg/dL — ABNORMAL HIGH (ref 65–99)
Potassium: 3.3 mmol/L — ABNORMAL LOW (ref 3.5–5.1)
Sodium: 137 mmol/L (ref 135–145)

## 2016-05-06 MED ORDER — IPRATROPIUM-ALBUTEROL 0.5-2.5 (3) MG/3ML IN SOLN
3.0000 mL | Freq: Four times a day (QID) | RESPIRATORY_TRACT | Status: DC
  Administered 2016-05-06 – 2016-05-09 (×11): 3 mL via RESPIRATORY_TRACT
  Filled 2016-05-06 (×10): qty 3

## 2016-05-06 MED ORDER — AMLODIPINE BESYLATE 10 MG PO TABS
10.0000 mg | ORAL_TABLET | Freq: Every day | ORAL | Status: DC
  Administered 2016-05-07 – 2016-05-09 (×3): 10 mg via ORAL
  Filled 2016-05-06 (×3): qty 1

## 2016-05-06 NOTE — Progress Notes (Addendum)
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  Her symptoms started after she worked outside in [employer].  PMHx of Pseudotumor cerebri, GERD  Subjective: Felt better after BIPAP for an hour last night  Vital signs: BP (!) 161/81 (BP Location: Left Arm) Comment: nurse notified  Pulse 84   Temp 98.6 F (37 C) (Oral)   Resp 20   Ht 5\' 3"  (1.6 m)   Wt 108.9 kg (240 lb)   SpO2 95%   BMI 42.51 kg/m   Intake/output: I/O last 3 completed shifts: In: 1260 [P.O.:480; I.V.:380; IV Piggyback:400] Out: -   General: pleasant, lying in bed, obese Eyes: pupils reactive, gross vision intact ENT: no stridor. Mild hoarseness, early thrush Cardiac: regular, no murmur Chest: Can't finish sentence w/o cough and wheeze. Abd: soft, non tender Ext: no edema Skin: no rashes   CMP Latest Ref Rng & Units 05/06/2016 05/03/2016 05/02/2016  Glucose 65 - 99 mg/dL 158(H) 177(H) 91  BUN 6 - 20 mg/dL 19 11 10   Creatinine 0.44 - 1.00 mg/dL 1.18(H) 1.00 1.27(H)  Sodium 135 - 145 mmol/L 137 133(L) 134(L)  Potassium 3.5 - 5.1 mmol/L 3.3(L) 4.2 3.7  Chloride 101 - 111 mmol/L 100(L) 103 100(L)  CO2 22 - 32 mmol/L 29 24 25   Calcium 8.9 - 10.3 mg/dL 8.6(L) 8.5(L) 8.6(L)  Total Protein 6.5 - 8.1 g/dL - - 7.2  Total Bilirubin 0.3 - 1.2 mg/dL - - 0.4  Alkaline Phos 38 - 126 U/L - - 64  AST 15 - 41 U/L - - 26  ALT 14 - 54 U/L - - 25     CBC Latest Ref Rng & Units 05/03/2016 05/02/2016 10/27/2015  WBC 4.0 - 10.5 K/uL 5.9 6.9 7.4  Hemoglobin 12.0 - 15.0 g/dL 11.4(L) 11.9(L) 12.1  Hematocrit 36.0 - 46.0 % 35.6(L) 36.8 36.6  Platelets 150 - 400 K/uL 293 247 284.0     ABG    Component Value Date/Time   PHART 7.394 05/04/2007 0035   PCO2ART 43.0 05/04/2007 0035   PO2ART 75.2 (L)  05/04/2007 0035   HCO3 25.7 (H) 05/04/2007 0035   TCO2 25 08/29/2011 0005   O2SAT 95.1 05/04/2007 0035     CBG (last 3)   Recent Labs  05/05/16 2039 05/06/16 0938 05/06/16 1330  GLUCAP 163* 161* 196*     Imaging: No results found.  Antibiotics: Rocephin 1/10 >> 1/11 Zithromax 1/10 >> 1/12 Ancef 1/10 >>  Cultures: Urine 1/10 >> Respiratory viral panel 1/10 >> Respiratory syncytial virus Blood 1/10 >> Staph species >>   Summary: 51 yo female with acute asthmatic bronchitis likely from environmental exposure and viral respiratory infection. Hypertensive now.  Assessment/plan:  Acute asthmatic bronchitis. - continue solumedrol 40 mg  q 12 hours throay. Consider prednisone 1/15 - continue brovana, pulmicort, duoneb and prn albuterol  Viral respiratory infection >> respiratory syncytial virus positive. - negative procalcitonin speaks against bacterial infection - d/c tamiflu since flu negative  Allergic rhinitis with acute viral sinusitis. - continue flonase, claritin  Staph species in blood culture 05/02/16. - likely contaminate. Does not look toxic. - D/C antibiotic  Steroid induced hyperglycemia. - SSI while on steroids  Hx of GERD. - continue protonix  Elevated blood pressure. Still higher than goal may reflect steroids,  -  no hx of HTN - increase norvasc to 10 mg starting 1/15 -add lasix 20   DVT prophylaxis - SQ heparin SUP - Protonix Nutrition - carb modified diet Goals of care - Full code    CD Annamaria Boots, MD [place] 05/06/2016, 1:44 PM Pager:  [phone] After 3pm call: [phone]

## 2016-05-07 ENCOUNTER — Ambulatory Visit: Payer: Self-pay | Admitting: Internal Medicine

## 2016-05-07 ENCOUNTER — Inpatient Hospital Stay (HOSPITAL_COMMUNITY): Payer: 59

## 2016-05-07 DIAGNOSIS — R05 Cough: Secondary | ICD-10-CM

## 2016-05-07 DIAGNOSIS — R059 Cough, unspecified: Secondary | ICD-10-CM

## 2016-05-07 LAB — BASIC METABOLIC PANEL
Anion gap: 10 (ref 5–15)
BUN: 25 mg/dL — ABNORMAL HIGH (ref 6–20)
CO2: 30 mmol/L (ref 22–32)
Calcium: 8.8 mg/dL — ABNORMAL LOW (ref 8.9–10.3)
Chloride: 99 mmol/L — ABNORMAL LOW (ref 101–111)
Creatinine, Ser: 1.15 mg/dL — ABNORMAL HIGH (ref 0.44–1.00)
GFR calc Af Amer: >60 mL/min (ref >60)
GFR calc non Af Amer: 55 mL/min — ABNORMAL LOW (ref >60)
Glucose, Bld: 155 mg/dL — ABNORMAL HIGH (ref 65–99)
Potassium: 3.3 mmol/L — ABNORMAL LOW (ref 3.5–5.1)
Sodium: 139 mmol/L (ref 135–145)

## 2016-05-07 LAB — GLUCOSE, CAPILLARY
Glucose-Capillary: 159 mg/dL — ABNORMAL HIGH (ref 65–99)
Glucose-Capillary: 176 mg/dL — ABNORMAL HIGH (ref 65–99)
Glucose-Capillary: 169 mg/dL — ABNORMAL HIGH (ref 65–99)
Glucose-Capillary: 267 mg/dL — ABNORMAL HIGH (ref 65–99)

## 2016-05-07 LAB — CULTURE, BLOOD (ROUTINE X 2): Culture: NO GROWTH

## 2016-05-07 MED ORDER — FLUTICASONE PROPIONATE 50 MCG/ACT NA SUSP
2.0000 | Freq: Every day | NASAL | Status: DC
  Administered 2016-05-07 – 2016-05-09 (×3): 2 via NASAL
  Filled 2016-05-07: qty 16

## 2016-05-07 MED ORDER — POTASSIUM CHLORIDE CRYS ER 10 MEQ PO TBCR
40.0000 meq | EXTENDED_RELEASE_TABLET | Freq: Once | ORAL | Status: AC
  Administered 2016-05-07: 40 meq via ORAL
  Filled 2016-05-07: qty 4

## 2016-05-07 MED ORDER — HYDROCOD POLST-CPM POLST ER 10-8 MG/5ML PO SUER
5.0000 mL | Freq: Three times a day (TID) | ORAL | Status: DC | PRN
  Administered 2016-05-07 – 2016-05-09 (×6): 5 mL via ORAL
  Filled 2016-05-07 (×6): qty 5

## 2016-05-07 MED ORDER — PANTOPRAZOLE SODIUM 40 MG PO TBEC
40.0000 mg | DELAYED_RELEASE_TABLET | Freq: Two times a day (BID) | ORAL | Status: DC
  Administered 2016-05-07 – 2016-05-09 (×5): 40 mg via ORAL
  Filled 2016-05-07 (×5): qty 1

## 2016-05-07 MED ORDER — METHYLPREDNISOLONE SODIUM SUCC 40 MG IJ SOLR
20.0000 mg | Freq: Three times a day (TID) | INTRAMUSCULAR | Status: DC
  Administered 2016-05-07 – 2016-05-08 (×3): 20 mg via INTRAVENOUS
  Filled 2016-05-07 (×3): qty 1

## 2016-05-07 MED ORDER — POTASSIUM CHLORIDE 20 MEQ/15ML (10%) PO SOLN
40.0000 meq | Freq: Once | ORAL | Status: DC
  Filled 2016-05-07: qty 30

## 2016-05-07 NOTE — Progress Notes (Signed)
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  Her symptoms started after she worked outside in [employer].  PMHx of Pseudotumor cerebri, GERD  Subjective: Over the weekend, with acute flare of asthma requiring hr long neb. Also, tried BipaP and she felt better. Feels better over all but not at baseline.  Still with cough. GERD is flared up. Has sinus congestion too.   Vital signs: BP (!) 158/91 (BP Location: Right Wrist) Comment: nurse notified  Pulse 87   Temp 98.1 F (36.7 C) (Oral)   Resp 20   Ht 5\' 3"  (1.6 m)   Wt 108.9 kg (240 lb)   SpO2 98%   BMI 42.51 kg/m   Intake/output: I/O last 3 completed shifts: In: 812.7 [P.O.:240; I.V.:272.7; IV Piggyback:300] Out: -   General: pleasant, lying in bed, obese. NAD. Comfortable. Off o2. Coughing.  Eyes: pupils reactive, gross vision intact ENT: (-) stridor.  Less thrush seen. (-) NVD.  Cardiac: regular, no murmur Chest: Can't finish sentence w/o cough and wheeze. Good ae. Some wheezing in BULF. (-) rhonchi/crackles.  Abd: soft, non tender. obese Ext: no edema Skin: no rashes   CMP Latest Ref Rng & Units 05/06/2016 05/03/2016 05/02/2016  Glucose 65 - 99 mg/dL 158(H) 177(H) 91  BUN 6 - 20 mg/dL 19 11 10   Creatinine 0.44 - 1.00 mg/dL 1.18(H) 1.00 1.27(H)  Sodium 135 - 145 mmol/L 137 133(L) 134(L)  Potassium 3.5 - 5.1 mmol/L 3.3(L) 4.2 3.7  Chloride 101 - 111 mmol/L 100(L) 103 100(L)  CO2 22 - 32 mmol/L 29 24 25   Calcium 8.9 - 10.3 mg/dL 8.6(L) 8.5(L) 8.6(L)  Total Protein 6.5 - 8.1 g/dL - - 7.2  Total Bilirubin 0.3 - 1.2 mg/dL - - 0.4  Alkaline Phos 38 - 126 U/L - - 64  AST 15 - 41 U/L - - 26  ALT 14 - 54 U/L - - 25     CBC Latest Ref Rng & Units 05/03/2016 05/02/2016 10/27/2015  WBC 4.0 - 10.5 K/uL  5.9 6.9 7.4  Hemoglobin 12.0 - 15.0 g/dL 11.4(L) 11.9(L) 12.1  Hematocrit 36.0 - 46.0 % 35.6(L) 36.8 36.6  Platelets 150 - 400 K/uL 293 247 284.0     ABG    Component Value Date/Time   PHART 7.394 05/04/2007 0035   PCO2ART 43.0 05/04/2007 0035   PO2ART 75.2 (L) 05/04/2007 0035   HCO3 25.7 (H) 05/04/2007 0035   TCO2 25 08/29/2011 0005   O2SAT 95.1 05/04/2007 0035     CBG (last 3)   Recent Labs  05/06/16 1737 05/06/16 2129 05/07/16 0736  GLUCAP 207* 184* 159*     Imaging: No results found.  Antibiotics: Rocephin 1/10 >> 1/11 Zithromax 1/10 >> 1/12 Ancef 1/10 >> 1/15  Cultures: Urine 1/10 >> poor sample.  Respiratory viral panel 1/10 >> Respiratory syncytial virus Blood 1/10 >> Staph species >> probably contaminant. Other blood culture was (-).   Summary: 51 yo female with acute asthmatic bronchitis likely from environmental exposure and viral respiratory infection. HTN.  Azotemia based on blood work on 1/14.   Assessment/plan:  Acute asthmatic bronchitis. - still with wheeze and cough. Better but not at baseline. Likely needs slow steroid taper. Dec IV dose of medrol to 1/2.  Anticipate switch to PO in am.  -  continue brovana, pulmicort, duoneb and prn albuterol - continue BiPaP prn. >> if she worsens and needs longer BiPaP, may need to transfer to SDU.  - keep O2 > 88%.  - cont prn cough meds >> will increase q8 per her request - check CXR today as f/u.   Viral respiratory infection >> respiratory syncytial virus positive. - negative procalcitonin speaks against bacterial infection - tamiflu has been discontinued.   Allergic rhinitis with acute viral sinusitis. - continue flonase, claritin  Staph species in blood culture 05/02/16. - likely contaminate. Does not look toxic. - Will d/c cefazolin  Steroid induced hyperglycemia. - SSI while on steroids  Hx of GERD. - continue protonix >> will increase to BID as she has done in the past when her GERD  flares up.   Elevated blood pressure. Still higher than goal may reflect steroids,  - no hx of HTN - Cont Norvasc at 10 mg/d starting 1/15 - PRN hydralazine - will d/c lasix on 1/15 as she had azotemia on 1/14. Checking lytes now.   DVT prophylaxis - SQ heparin SUP - Protonix Nutrition - carb modified diet Goals of care - Full code  Plan extensively d/w pt.   Monica Becton, MD 05/07/2016, 9:12 AM [place] Pulmonary and Critical Care Pager [phone] After 3 pm or if no answer, call [phone]

## 2016-05-07 NOTE — Telephone Encounter (Signed)
Will close message - Pt was seen in office on 05/02/16

## 2016-05-08 DIAGNOSIS — J45909 Unspecified asthma, uncomplicated: Secondary | ICD-10-CM

## 2016-05-08 LAB — CBC WITH DIFFERENTIAL/PLATELET
Basophils Absolute: 0 10*3/uL (ref 0.0–0.1)
Basophils Relative: 0 %
Eosinophils Absolute: 0 10*3/uL (ref 0.0–0.7)
Eosinophils Relative: 0 %
HCT: 35.6 % — ABNORMAL LOW (ref 36.0–46.0)
Hemoglobin: 11.5 g/dL — ABNORMAL LOW (ref 12.0–15.0)
Lymphocytes Relative: 10 %
Lymphs Abs: 1.7 10*3/uL (ref 0.7–4.0)
MCH: 26.4 pg (ref 26.0–34.0)
MCHC: 32.3 g/dL (ref 30.0–36.0)
MCV: 81.8 fL (ref 78.0–100.0)
Monocytes Absolute: 1.4 10*3/uL — ABNORMAL HIGH (ref 0.1–1.0)
Monocytes Relative: 8 %
Neutro Abs: 14.8 10*3/uL — ABNORMAL HIGH (ref 1.7–7.7)
Neutrophils Relative %: 82 %
Platelets: 341 10*3/uL (ref 150–400)
RBC: 4.35 MIL/uL (ref 3.87–5.11)
RDW: 14.6 % (ref 11.5–15.5)
WBC: 18 10*3/uL — ABNORMAL HIGH (ref 4.0–10.5)

## 2016-05-08 LAB — BASIC METABOLIC PANEL
Anion gap: 7 (ref 5–15)
BUN: 23 mg/dL — ABNORMAL HIGH (ref 6–20)
CO2: 30 mmol/L (ref 22–32)
Calcium: 8.5 mg/dL — ABNORMAL LOW (ref 8.9–10.3)
Chloride: 100 mmol/L — ABNORMAL LOW (ref 101–111)
Creatinine, Ser: 1.03 mg/dL — ABNORMAL HIGH (ref 0.44–1.00)
GFR calc Af Amer: >60 mL/min (ref >60)
GFR calc non Af Amer: >60 mL/min (ref >60)
Glucose, Bld: 182 mg/dL — ABNORMAL HIGH (ref 65–99)
Potassium: 3.6 mmol/L (ref 3.5–5.1)
Sodium: 137 mmol/L (ref 135–145)

## 2016-05-08 LAB — GLUCOSE, CAPILLARY
Glucose-Capillary: 163 mg/dL — ABNORMAL HIGH (ref 65–99)
Glucose-Capillary: 181 mg/dL — ABNORMAL HIGH (ref 65–99)
Glucose-Capillary: 183 mg/dL — ABNORMAL HIGH (ref 65–99)

## 2016-05-08 MED ORDER — PREDNISONE 20 MG PO TABS: 40.0000 mg | ORAL_TABLET | Freq: Every day | ORAL | Status: DC

## 2016-05-08 MED ORDER — PREDNISONE 20 MG PO TABS
40.0000 mg | ORAL_TABLET | Freq: Every day | ORAL | Status: DC
  Administered 2016-05-08 – 2016-05-09 (×2): 40 mg via ORAL
  Filled 2016-05-08 (×2): qty 2

## 2016-05-08 NOTE — Consult Note (Signed)
   [facility] Inpatient Consult   05/08/2016  [name] 11/23/1965 [id number]    [facility] Care Management/Link to Wellness follow up for [employer] employees/dependents with [facility]. No Link to Wellness needs communicated. Aware that she will receive post discharge call. Appreciative of visit.   Marthenia Rolling, MSN-Ed, RN,BSN [facility] Liaison [phone]

## 2016-05-08 NOTE — Progress Notes (Signed)
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  Her symptoms started after she worked outside in [employer].  PMHx of Pseudotumor cerebri, GERD  Subjective: Better over all. Less cough. GERD better. Less SOB.  Did not have to use bipap last night.     Vital signs: BP (!) 157/94 (BP Location: Left Arm)   Pulse 81   Temp 97.9 F (36.6 C) (Oral)   Resp 18   Ht 5\' 3"  (1.6 m)   Wt 108.9 kg (240 lb)   SpO2 98%   BMI 42.51 kg/m   Intake/output: I/O last 3 completed shifts: In: 77 [P.O.:480; I.V.:125] Out: -   General: pleasant, lying in bed, obese. NAD. Comfortable. Off o2. Coughing.  Eyes: pupils reactive, gross vision intact ENT: (-) stridor.  (-) thrush. (-) NVD.  Cardiac: regular, no murmur Chest: Can't finish sentence w/o cough. (-) wheeze/ rhonchi/crackles.  Abd: soft, non tender. obese Ext: no edema Skin: no rashes   CMP Latest Ref Rng & Units 05/08/2016 05/07/2016 05/06/2016  Glucose 65 - 99 mg/dL 182(H) 155(H) 158(H)  BUN 6 - 20 mg/dL 23(H) 25(H) 19  Creatinine 0.44 - 1.00 mg/dL 1.03(H) 1.15(H) 1.18(H)  Sodium 135 - 145 mmol/L 137 139 137  Potassium 3.5 - 5.1 mmol/L 3.6 3.3(L) 3.3(L)  Chloride 101 - 111 mmol/L 100(L) 99(L) 100(L)  CO2 22 - 32 mmol/L 30 30 29   Calcium 8.9 - 10.3 mg/dL 8.5(L) 8.8(L) 8.6(L)  Total Protein 6.5 - 8.1 g/dL - - -  Total Bilirubin 0.3 - 1.2 mg/dL - - -  Alkaline Phos 38 - 126 U/L - - -  AST 15 - 41 U/L - - -  ALT 14 - 54 U/L - - -     CBC Latest Ref Rng & Units 05/08/2016 05/03/2016 05/02/2016  WBC 4.0 - 10.5 K/uL 18.0(H) 5.9 6.9  Hemoglobin 12.0 - 15.0 g/dL 11.5(L) 11.4(L) 11.9(L)  Hematocrit 36.0 - 46.0 % 35.6(L) 35.6(L) 36.8  Platelets 150 - 400 K/uL 341 293 247     ABG    Component Value Date/Time   PHART 7.394 05/04/2007 0035   PCO2ART 43.0 05/04/2007 0035   PO2ART 75.2 (L) 05/04/2007 0035   HCO3 25.7 (H) 05/04/2007 0035   TCO2 25 08/29/2011 0005   O2SAT 95.1 05/04/2007 0035     CBG (last 3)   Recent Labs  05/07/16 1816 05/07/16 2113 05/08/16 0751  GLUCAP 169* 267* 163*     Imaging: Dg Chest Port 1 View  Result Date: 05/07/2016 CLINICAL DATA:  Cough. EXAM: PORTABLE CHEST 1 VIEW COMPARISON:  05/02/2016 . FINDINGS: Mediastinum and hilar structures normal. Heart size normal. Low lung volumes with mild basilar atelectasis. No focal infiltrate. No pleural effusion or pneumothorax. IMPRESSION: Low lung volumes with basilar atelectasis. Electronically Signed   By: Marcello Moores  Register   On: 05/07/2016 09:55    Antibiotics: Rocephin 1/10 >> 1/11 Zithromax 1/10 >> 1/12 Ancef 1/10 >> 1/15  Cultures: Urine 1/10 >> poor sample.  Respiratory viral panel 1/10 >> Respiratory syncytial virus Blood 1/10 >> Staph species >> probably contaminant. Other blood culture was (-).   Summary: 51 yo female with acute asthmatic bronchitis / asthma exacerbation likely from environmental exposure and viral respiratory infection. HTN.  Azotemia based on blood work on 1/14. Better.  Assessment/plan:  Acute asthmatic bronchitis. - still with wheeze and cough > definitely better last 24hrs. Likely needs slow steroid taper.  - will switch meds to PO pred.  - continue brovana, pulmicort, duoneb and prn albuterol - continue BiPaP prn. >> if she worsens and needs longer BiPaP, may need to transfer to SDU.  - keep O2 > 88%.  - cont prn cough meds - CXR on 1/15 >> no PNA seen.   Viral respiratory infection >> respiratory syncytial virus positive. - negative procalcitonin speaks against bacterial infection - tamiflu has been discontinued.  - Abx and antifungals were discontinued.   Allergic rhinitis with acute viral sinusitis. - continue flonase, claritin  Staph species in blood culture  05/02/16. - likely contaminant. Does not look toxic. - Cefazolin was discontinued on 1/15.   Steroid induced hyperglycemia. - SSI while on steroids  Hx of GERD. - continue protonix at BID as GERD flared up with recent bronchitis.   Elevated blood pressure. Still higher than goal may reflect steroids,  - no hx of HTN - Cont Norvasc at 10 mg/d starting 1/15 - PRN hydralazine - Lasix on hold 2/2 azotemia   DVT prophylaxis - SQ heparin SUP - Protonix Nutrition - carb modified diet Goals of care - Full code  Plan extensively d/w pt. Anticipate d/c in am.   Monica Becton, MD 05/08/2016, 9:17 AM [place] [facility] Pager [phone] After 3 pm or if no answer, call [phone]

## 2016-05-09 DIAGNOSIS — J4540 Moderate persistent asthma, uncomplicated: Secondary | ICD-10-CM

## 2016-05-09 DIAGNOSIS — J40 Bronchitis, not specified as acute or chronic: Secondary | ICD-10-CM

## 2016-05-09 LAB — GLUCOSE, CAPILLARY
Glucose-Capillary: 140 mg/dL — ABNORMAL HIGH (ref 65–99)
Glucose-Capillary: 181 mg/dL — ABNORMAL HIGH (ref 65–99)

## 2016-05-09 MED ORDER — LORATADINE 10 MG PO TABS: 10.0000 mg | ORAL_TABLET | Freq: Every day | ORAL | Status: DC

## 2016-05-09 MED ORDER — PREDNISONE 10 MG PO TABS: ORAL_TABLET | ORAL | 0 refills | Status: DC

## 2016-05-09 MED ORDER — HYDROCOD POLST-CPM POLST ER 10-8 MG/5ML PO SUER: 5.0000 mL | Freq: Three times a day (TID) | ORAL | 0 refills | Status: DC | PRN

## 2016-05-09 MED FILL — predniSONE 10 MG TABS: 10 | 12 days supply | Qty: 30 | Fill #0

## 2016-05-09 NOTE — Progress Notes (Signed)
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  Her symptoms started after she worked outside in [employer].  PMHx of Pseudotumor cerebri, GERD  Subjective: Significantly improved.  Hardly coughed this am.  At baseline.  No issues overnight.     Vital signs: BP (!) 188/66 (BP Location: Left Arm)   Pulse 88   Temp 98.4 F (36.9 C) (Oral)   Resp 20   Ht 5\' 3"  (1.6 m)   Wt 108.9 kg (240 lb)   SpO2 98%   BMI 42.51 kg/m   Intake/output: I/O last 3 completed shifts: In: 125 [I.V.:125] Out: -   General: pleasant, lying in bed, obese. NAD. Comfortable. Off o2.  Eyes: pupils reactive, gross vision intact ENT: (-) stridor.  (-) thrush. (-) NVD.  Cardiac: regular, no murmur Chest: good ae, (-) wheezing. Some crackles at bases.  Abd: soft, non tender. obese Ext: no edema Skin: no rashes   CMP Latest Ref Rng & Units 05/08/2016 05/07/2016 05/06/2016  Glucose 65 - 99 mg/dL 182(H) 155(H) 158(H)  BUN 6 - 20 mg/dL 23(H) 25(H) 19  Creatinine 0.44 - 1.00 mg/dL 1.03(H) 1.15(H) 1.18(H)  Sodium 135 - 145 mmol/L 137 139 137  Potassium 3.5 - 5.1 mmol/L 3.6 3.3(L) 3.3(L)  Chloride 101 - 111 mmol/L 100(L) 99(L) 100(L)  CO2 22 - 32 mmol/L 30 30 29   Calcium 8.9 - 10.3 mg/dL 8.5(L) 8.8(L) 8.6(L)  Total Protein 6.5 - 8.1 g/dL - - -  Total Bilirubin 0.3 - 1.2 mg/dL - - -  Alkaline Phos 38 - 126 U/L - - -  AST 15 - 41 U/L - - -  ALT 14 - 54 U/L - - -     CBC Latest Ref Rng & Units 05/08/2016 05/03/2016 05/02/2016  WBC 4.0 - 10.5 K/uL 18.0(H) 5.9 6.9  Hemoglobin 12.0 - 15.0 g/dL 11.5(L) 11.4(L) 11.9(L)  Hematocrit 36.0 - 46.0 % 35.6(L) 35.6(L) 36.8  Platelets 150 - 400 K/uL 341 293 247     ABG    Component Value Date/Time   PHART 7.394 05/04/2007 0035   PCO2ART 43.0  05/04/2007 0035   PO2ART 75.2 (L) 05/04/2007 0035   HCO3 25.7 (H) 05/04/2007 0035   TCO2 25 08/29/2011 0005   O2SAT 95.1 05/04/2007 0035     CBG (last 3)   Recent Labs  05/08/16 1138 05/08/16 1709 05/09/16 0730  GLUCAP 181* 183* 140*     Imaging: Dg Chest Port 1 View  Result Date: 05/07/2016 CLINICAL DATA:  Cough. EXAM: PORTABLE CHEST 1 VIEW COMPARISON:  05/02/2016 . FINDINGS: Mediastinum and hilar structures normal. Heart size normal. Low lung volumes with mild basilar atelectasis. No focal infiltrate. No pleural effusion or pneumothorax. IMPRESSION: Low lung volumes with basilar atelectasis. Electronically Signed   By: Marcello Moores  Register   On: 05/07/2016 09:55    Antibiotics: Rocephin 1/10 >> 1/11 Zithromax 1/10 >> 1/12 Ancef 1/10 >> 1/15  Cultures: Urine 1/10 >> poor sample.  Respiratory viral panel 1/10 >> Respiratory syncytial virus Blood 1/10 >> Staph species >> probably contaminant. Other blood culture was (-).   Summary: 51 yo female with acute asthmatic bronchitis / asthma exacerbation likely from environmental exposure and viral respiratory infection. HTN.  Azotemia based on blood work on 1/14. Better.   Assessment/plan:  Acute  asthmatic bronchitis. Clinically improved and at baseline.  - prednisone taper as discussed : 10 mg/tab, 4 tabs a day for 3 days then 3 tabs a day for 3 days then 2 tabs a day for 3 days then 1 tab a day for 3 days then d/c.  Pt can taper quicker if she is better.  - resume symbicort 160/4.5 2P BID and alb MDI 2 puffs every 4 hrs as needed.  - advised pt on spacer - Cont prn Tussionex at home - pt will assess on f/u if neb meds are better  Viral respiratory infection >> respiratory syncytial virus positive. - negative procalcitonin speaks against bacterial infection - tamiflu has been discontinued.  - Abx and antifungals were discontinued.   Allergic rhinitis with acute viral sinusitis. - continue flonase, claritin  GERD. -  continue protonix at BID as GERD flared up with recent bronchitis > plan for protonix 40 mg BID for the next 2 weeks while on prednisone taper.  Prednisone flares up her GERD.   HTN (new diagnosis this admission but I feel she has been HTNsive) - will resume Lasix 40 mg/day (she takes lasix for her pseudotumor cerebri). - pt wants to hold off on norvasc - told pt to observe her BP - needs sleep study as outpt (part of w/u for HTN)  DVT prophylaxis - SQ heparin SUP - Protonix Nutrition - carb modified diet Goals of care - Full code  Plan extensively d/w pt and husband.   Pt is to be discharged today. She needs f/u with PCP (Dr. Annamaria Boots) in a week.  Will need rpt BMET to make sure Crerat is stable on lasix.  Needs f/u on BP. Needs sleep study as well.   Pt will need the following meds called in to pharmacy :  Prednisone taper, Tussionex good for 10 days, Protonix 40 mg BID for 1 month.    Monica Becton, MD 05/09/2016, 9:44 AM [place] [facility] Pager [phone] After 3 pm or if no answer, call [phone]

## 2016-05-09 NOTE — Care Management Note (Signed)
Case Management Note  Patient Details  Name: [name] MRN: [id number] Date of Birth: October 04, 1965  Subjective/Objective:                    Action/Plan:d/c home.   Expected Discharge Date:  05/09/16               Expected Discharge Plan:  Home/Self Care  In-House Referral:     Discharge planning Services  CM Consult  Post Acute Care Choice:    Choice offered to:     DME Arranged:    DME Agency:     HH Arranged:    HH Agency:     Status of Service:  Completed, signed off  If discussed at [employer] of Stay Meetings, dates discussed:    Additional Comments:  Dessa Phi, RN 05/09/2016, 12:12 PM

## 2016-05-09 NOTE — Discharge Summary (Signed)
Physician Discharge Summary       Patient ID: [name] MRN: [id number] DOB/AGE: 05/30/1965 51 y.o.  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 She was admitted w/ asthmatic exacerbation, acute bronchitis. Felt to be precipitated by viral prodrome. She was treated in the usual fashion which included: supplemental oxygen, systemic steroids, scheduled BDs, GERD rx and cough suppression. She made slow but steady progress and was deemed ready for discharge on 1/17.    Discharge Plan by active problems  Acute asthmatic bronchitis. Clinically improved and at baseline.  - prednisone taper as discussed : 10 mg/tab, 4 tabs a day for 3 days then 3 tabs a day for 3 days then 2 tabs a day for 3 days then 1 tab a day for 3 days then d/c.  Pt can taper quicker if she is better.  - resume symbicort 160/4.5 2P BID and alb MDI 2 puffs every 4 hrs as needed.  - advised pt on spacer - Cont prn Tussionex at home - pt will assess on f/u if neb meds are better  Viral respiratory infection >> respiratory syncytial virus positive. - negative procalcitonin speaks against bacterial infection - tamiflu has been discontinued.  - Abx and antifungals were discontinued.   Allergic rhinitis with acute viral sinusitis. - continue flonase, claritin  GERD. - continue protonix at BID as GERD flared up with recent bronchitis > plan for protonix 40 mg BID for the next 2 weeks while on prednisone taper.  Prednisone flares up her GERD.   HTN (new diagnosis this admission but I feel she has been HTNsive) - will resume Lasix 40 mg/day (she takes lasix for her  pseudotumor cerebri). - pt wants to hold off on norvasc - told pt to observe her BP - needs sleep study as outpt (part of w/u for HTN)   Significant Hospital tests/ studies  Consults: none   Discharge Exam: BP (!) 188/66 (BP Location: Left Arm)   Pulse 88   Temp 98.4 F (36.9 C) (Oral)   Resp 20   Ht 5\' 3"  (1.6 m)   Wt 240 lb (108.9 kg)   SpO2 98%   BMI 42.51 kg/m    General: pleasant, lying in bed, obese. NAD. Comfortable. Off o2.  Eyes: pupils reactive, gross vision intact ENT: (-) stridor.  (-) thrush. (-) NVD.  Cardiac: regular, no murmur Chest: good ae, (-) wheezing. Some crackles at bases.  Abd: soft, non tender. obese Ext: no edema Skin: no rashes  Labs at discharge Lab Results  Component Value Date   CREATININE 1.03 (H) 05/08/2016   BUN 23 (H) 05/08/2016   NA 137 05/08/2016   K 3.6 05/08/2016   CL 100 (L) 05/08/2016   CO2 30 05/08/2016   Lab Results  Component Value Date   WBC 18.0 (H) 05/08/2016   HGB 11.5 (L) 05/08/2016   HCT 35.6 (L) 05/08/2016   MCV 81.8 05/08/2016   PLT 341 05/08/2016   Lab Results  Component Value Date   ALT 25 05/02/2016   AST 26 05/02/2016   ALKPHOS 64 05/02/2016   BILITOT 0.4 05/02/2016   Lab  Results  Component Value Date   INR 1.07 05/02/2016    Current radiology studies No results found.  Disposition:  01-Home or Self Care  Discharge Instructions    AMB Referral to [place] Management    Complete by:  As directed    Please assign UMR member for post discharge call. Currently at [facility]. Thanks. Marthenia Rolling, [place], RN,BSN-THN [facility] [phone]   Reason for consult:  Please assign UMR member for post discharge   Expected date of contact:  1-3 days (reserved for hospital discharges)   Diet - low sodium heart healthy    Complete by:  As directed    Increase activity slowly    Complete by:  As directed      Allergies as of 05/09/2016      Reactions   Influenza A (h1n1)  [place] Vac    Per pt report   Omalizumab    Arvid Right* REACTION: angioedema-looses airway   Promethazine Hcl    REACTION: hallucinations      Medication List    TAKE these medications   AEROCHAMBER MV inhaler Use as instructed   albuterol (2.5 MG/3ML) 0.083% nebulizer solution Commonly known as:  PROVENTIL USE 1 VIAL BY NEBULIZER EVERY 6 HOURS AS NEEDED FOR WHEEZING OR SHORTNESS OF BREATH   VENTOLIN HFA 108 (90 Base) MCG/ACT inhaler Generic drug:  albuterol INHALE 2 PUFFS BY MOUTH INTO THE LUNGS EVERY 6 HOURS AS NEEDED FOR WHEEZING   Azelastine-Fluticasone 137-50 MCG/ACT Susp Commonly known as:  DYMISTA One puff twice daily ea nostril   budesonide-formoterol 160-4.5 MCG/ACT inhaler Commonly known as:  SYMBICORT Inhale 2 puffs into the lungs 2 (two) times daily. Rinse mouth   chlorpheniramine-HYDROcodone 10-8 MG/5ML Suer Commonly known as:  TUSSIONEX Take 5 mLs by mouth every 8 (eight) hours as needed for cough. What changed:  how much to take  how to take this  when to take this  reasons to take this  additional instructions   cyclobenzaprine 5 MG tablet Commonly known as:  FLEXERIL Take 1 tablet (5 mg total) by mouth 3 (three) times daily as needed for muscle spasms (or migraines).   econazole nitrate 1 % cream As needed   EPINEPHrine 0.3 mg/0.3 mL Soaj injection Commonly known as:  EPI-PEN Inject into thigh for severe asthma/ allergicreaction   furosemide 40 MG tablet Commonly known as:  LASIX TAKE 1 TABLET BY MOUTH ONCE DAILY AS NEEDED   ibuprofen 800 MG tablet Commonly known as:  ADVIL,MOTRIN Take 1 tablet by mouth daily as needed.   loratadine 10 MG tablet Commonly known as:  CLARITIN Take 1 tablet (10 mg total) by mouth daily. Start taking on:  05/10/2016   LORazepam 1 MG tablet Commonly known as:  ATIVAN TAKE 1 TABLET BY MOUTH AT BEDTIME AS NEEDED.   pantoprazole 40 MG tablet Commonly known as:  PROTONIX Twice daily before meal What  changed:  how much to take  how to take this  when to take this  additional instructions   PATANOL 0.1 % ophthalmic solution Generic drug:  olopatadine INSTILL 1 DROP INTO BOTH EYES TWO TIMES DAILY AS NEEDED FOR ALLERGIES   predniSONE 10 MG tablet Commonly known as:  DELTASONE Take 4 tabsx3d, then 3tabsX3d, then 2tabx3d, then 1tabx3d   traMADol 50 MG tablet Commonly known as:  ULTRAM TAKE 1 TABLET BY MOUTH EVERY 4 HOURS AS NEEDED FOR PAIN   TRANSDERM-SCOP (1.5 MG) 1 MG/3DAYS Generic drug:  scopolamine APPLY 1  PATCH ONTO THE SKIN EVERY 3 DAYS.   urea 40 % Crea Commonly known as:  CARMOL As needed        Discharged Condition: good  Physician Statement:   The Patient was personally examined, the discharge assessment and plan has been personally reviewed and I agree with ACNP Babcock's assessment and plan. > 30 minutes of time have been dedicated to discharge assessment, planning and discharge instructions.   Signed: Clementeen Graham 05/09/2016, 11:46 AM   Physician Discharge Summary    Discharged Condition: stable  Physician Statement:   The Patient was personally examined, the discharge assessment and plan has been personally reviewed and I agree with Marni Griffon assessment and plan. 37 minutes of time have been dedicated to discharge assessment, planning and discharge instructions.   Please see separate progress note from me today. Plan as discussed on my note as well as Designer, fashion/clothing.   Pt needs f/u with Pulmonary/PCP.   Signed:  J. [place] 05/09/2016, 12:40 PM [place] [facility] Pager [phone] After 3 pm or if no answer, call [phone]

## 2016-05-10 ENCOUNTER — Other Ambulatory Visit: Payer: Self-pay | Admitting: *Deleted

## 2016-05-10 NOTE — Patient Outreach (Addendum)
[facility]) Care Management  05/10/2016  [name] 1965/10/08 [id number]   Subjective: Telephone call to patient's home / mobile number, no answer, message states voicemail full, and unable to leave message.     Objective: Per chart review, patient hospitalized  05/02/16  - 05/09/16 for asthma exacerbation.   Had ED visit on 12/20/15 for asthma exacerbation.   Patient also has a  History of hypertension.   Assessment: Received UMR Transition of care referral on 05/04/16.   Transition of care follow up pending patient contact.   Plan: RNCM will call patient for 2nd telephone outreach attempt, transition of care follow up, within 10 business days,  if no return call.    [name], BSN, [place] Management [facility] Telephonic CM Phone: [phone] Fax: [phone]

## 2016-05-11 ENCOUNTER — Telehealth: Payer: Self-pay | Admitting: Internal Medicine

## 2016-05-11 ENCOUNTER — Other Ambulatory Visit: Payer: Self-pay | Admitting: *Deleted

## 2016-05-11 NOTE — Patient Outreach (Signed)
[name] [facility]) Care Management  05/11/2016  [name] [birth year] [id number]   Subjective: Telephone call to patient's home / mobile number, no answer, message states voicemail full, and unable to leave message.     Objective: Per chart review, patient hospitalized  05/02/16  - 05/09/16 for asthma exacerbation.   Had ED visit on 12/20/15 for asthma exacerbation.   Patient also has a  History of hypertension.   Assessment: Received UMR Transition of care referral on 05/04/16.   Transition of care follow up pending patient contact.   Plan: RNCM will call patient for 3rd telephone outreach attempt, transition of care follow up, within 10 business days,  if no return call.    Moorea Boissonneault H. Annia Friendly, BSN, [place] Management [facility] Telephonic CM Phone: [phone] Fax: [phone]

## 2016-05-11 NOTE — Telephone Encounter (Signed)
Spoke with pt. She has been scheduled to TP on 05/15/16 at 2:15pm. Nothing further was needed.

## 2016-05-11 NOTE — Telephone Encounter (Signed)
Rec'd FMLA forms via fax from [place] to [employer] via interoffice mail - 05/11/2016-pr

## 2016-05-14 ENCOUNTER — Other Ambulatory Visit: Payer: Self-pay | Admitting: *Deleted

## 2016-05-14 ENCOUNTER — Encounter: Payer: Self-pay | Admitting: *Deleted

## 2016-05-14 NOTE — Patient Outreach (Signed)
[facility]) Care Management  05/14/2016  [name] 07/16/1965 [id number]   Subjective:Telephone call to patient's home / mobile number, no answer, message states voicemail full, and unable to leave message.  Telephone call to patient's emergency contact number for mother, female answer phone, states wrong number,  and unable to leave a message.   Objective:Per chart review, patient hospitalized 05/02/16 - 05/09/16 for asthma exacerbation. Had ED visit on 12/20/15 for asthma exacerbation. Patient also has a History of hypertension.   Assessment:Received UMR Transition of care referral on 05/04/16. Transition of care follow up pending patient contact.   Plan: RNCM will send patient unsuccessful outreach letter, [facility] pamphlet, and proceed with case closure within 10 business days, if no return call.    Michaela Broski H. Annia Friendly, BSN, [place] Management [facility] Telephonic CM Phone: [phone] Fax: [phone]

## 2016-05-15 ENCOUNTER — Encounter: Payer: Self-pay | Admitting: Adult Health

## 2016-05-15 ENCOUNTER — Ambulatory Visit (INDEPENDENT_AMBULATORY_CARE_PROVIDER_SITE_OTHER): Payer: 59 | Admitting: Adult Health

## 2016-05-15 ENCOUNTER — Telehealth: Payer: Self-pay | Admitting: Adult Health

## 2016-05-15 DIAGNOSIS — J45901 Unspecified asthma with (acute) exacerbation: Secondary | ICD-10-CM

## 2016-05-15 DIAGNOSIS — K219 Gastro-esophageal reflux disease without esophagitis: Secondary | ICD-10-CM | POA: Diagnosis not present

## 2016-05-15 MED ORDER — TIOTROPIUM BROMIDE MONOHYDRATE 1.25 MCG/ACT IN AERS: 2.0000 | INHALATION_SPRAY | Freq: Every day | RESPIRATORY_TRACT | 6 refills | Status: DC

## 2016-05-15 NOTE — Progress Notes (Signed)
@Patient  ID: [name], female    DOB: [birth year], 51 y.o.   MRN: [id number]  Chief Complaint  Patient presents with  . Follow-up    Asthma     Referring provider: Benito Mccreedy, MD  HPI: 51 yo female never smoker with moderate persistent asthma and AR.     05/15/2016 Follow up : Asthma flare /Hospital follow up  Pt returns for 1 week follow up . She was admitted 1/10 to 1/17 for asthmatic bronchitis exacerbation with +RSV. She was treated with aggressive abx, steroids , nebs.  She was recommended to go for sleep study . She has some daytime sleepiness, weight gain, Hypertension . Discussed sleep study , wants to hold off right now. Agrees to discuss on return.  Lots of reflux despite PPI Twice daily  , last endo in 2016 with dilation.  No choking.  Has tried singulair in past without perceived benefit .  Since discharge remains weak, low energy . Wheezing and cough are better.  Has few days left of prednisone .    Allergies  Allergen Reactions  . Influenza A (H1n1) Monoval Vac     Per pt report  . Omalizumab     Arvid Right* REACTION: angioedema-looses airway  . Promethazine Hcl     REACTION: hallucinations    Immunization History  Administered Date(s) Administered  . Pneumococcal Polysaccharide-23 05/24/2009    Past Medical History:  Diagnosis Date  . Allergic rhinitis   . Asthma    h/o intubation 2001  . Dysphagia    Dr. Ardis Hughs.  egd w/ dilatation 06/08/2007  . Esophageal dilatation   . GERD (gastroesophageal reflux disease)   . Headache    hx migraines  . Hypertension in pregnancy    pregnancy induced htn  . Meniscal injury   . Morbid obesity with body mass index of 45.0-49.9 in adult [facility])   . Pseudotumor cerebri    has required LP for release of pressure  . Steroid-induced hyperglycemia 05/08/2013    Tobacco History: History  Smoking Status  . Never Smoker  Smokeless Tobacco  . Never Used   Counseling given: Not Answered   Outpatient  Encounter Prescriptions as of 05/15/2016  Medication Sig  . albuterol (PROVENTIL) (2.5 MG/3ML) 0.083% nebulizer solution USE 1 VIAL BY NEBULIZER EVERY 6 HOURS AS NEEDED FOR WHEEZING OR SHORTNESS OF BREATH  . Azelastine-Fluticasone (DYMISTA) 137-50 MCG/ACT SUSP One puff twice daily ea nostril  . budesonide-formoterol (SYMBICORT) 160-4.5 MCG/ACT inhaler Inhale 2 puffs into the lungs 2 (two) times daily. Rinse mouth  . chlorpheniramine-HYDROcodone (TUSSIONEX) 10-8 MG/5ML SUER Take 5 mLs by mouth every 8 (eight) hours as needed for cough.  . cyclobenzaprine (FLEXERIL) 5 MG tablet Take 1 tablet (5 mg total) by mouth 3 (three) times daily as needed for muscle spasms (or migraines).  Marland Kitchen econazole nitrate 1 % cream As needed  . EPINEPHrine 0.3 mg/0.3 mL IJ SOAJ injection Inject into thigh for severe asthma/ allergicreaction  . furosemide (LASIX) 40 MG tablet TAKE 1 TABLET BY MOUTH ONCE DAILY AS NEEDED  . ibuprofen (ADVIL,MOTRIN) 800 MG tablet Take 1 tablet by mouth daily as needed.  Marland Kitchen LORazepam (ATIVAN) 1 MG tablet TAKE 1 TABLET BY MOUTH AT BEDTIME AS NEEDED.  Marland Kitchen pantoprazole (PROTONIX) 40 MG tablet Twice daily before meal (Patient taking differently: Take 40 mg by mouth 2 (two) times daily. )  . PATANOL 0.1 % ophthalmic solution INSTILL 1 DROP INTO BOTH EYES TWO TIMES DAILY AS NEEDED FOR ALLERGIES  .  predniSONE (DELTASONE) 10 MG tablet Take 4 tabsx3d, then 3tabsX3d, then 2tabx3d, then 1tabx3d  . Spacer/Aero-Holding Chambers (AEROCHAMBER MV) inhaler Use as instructed  . traMADol (ULTRAM) 50 MG tablet TAKE 1 TABLET BY MOUTH EVERY 4 HOURS AS NEEDED FOR PAIN  . TRANSDERM-SCOP, 1.5 MG, 1 MG/3DAYS APPLY 1 PATCH ONTO THE SKIN EVERY 3 DAYS.  Marland Kitchen urea (CARMOL) 40 % CREA As needed  . VENTOLIN HFA 108 (90 Base) MCG/ACT inhaler INHALE 2 PUFFS BY MOUTH INTO THE LUNGS EVERY 6 HOURS AS NEEDED FOR WHEEZING  . loratadine (CLARITIN) 10 MG tablet Take 1 tablet (10 mg total) by mouth daily. (Patient not taking: Reported on  05/15/2016)   No facility-administered encounter medications on file as of 05/15/2016.      Review of Systems  Constitutional:   No  weight loss, night sweats,  Fevers, chills +, fatigue, or  lassitude.  HEENT:   No headaches,  Difficulty swallowing,  Tooth/dental problems, or  Sore throat,                No sneezing, itching, ear ache,  +nasal congestion, post nasal drip,   CV:  No chest pain,  Orthopnea, PND, swelling in lower extremities, anasarca, dizziness, palpitations, syncope.   GI  No  abdominal pain, nausea, vomiting, diarrhea, change in bowel habits, loss of appetite, bloody stools.   Resp:  .  No chest wall deformity  Skin: no rash or lesions.  GU: no dysuria, change in color of urine, no urgency or frequency.  No flank pain, no hematuria   MS:  No joint pain or swelling.  No decreased range of motion.  No back pain.    Physical Exam  BP 140/90   Pulse 100   Temp 98.4 F (36.9 C) (Oral)   Ht 5\' 3"  (1.6 m)   SpO2 91%   GEN: A/Ox3; pleasant , NAD, morbidly obese    HEENT:  [place]/AT,  EACs-clear, TMs-wnl, NOSE-clear, THROAT-clear, no lesions, no postnasal drip or exudate noted. Class 2 MP airway   NECK:  Supple w/ fair ROM; no JVD; normal carotid impulses w/o bruits; no thyromegaly or nodules palpated; no lymphadenopathy.    RESP  Clear  P & A; w/o, wheezes/ rales/ or rhonchi. no accessory muscle use, no dullness to percussion  CARD:  RRR, no m/r/g, tr peripheral edema, pulses intact, no cyanosis or clubbing.  GI:   Soft & nt; nml bowel sounds; no organomegaly or masses detected.   Musco: Warm bil, no deformities or joint swelling noted.   Neuro: alert, no focal deficits noted.    Skin: Warm, no lesions or rashes  Psych:  No change in mood or affect. No depression or anxiety.  No memory loss.  Lab Results:  CBC    Component Value Date/Time   WBC 18.0 (H) 05/08/2016 0628   RBC 4.35 05/08/2016 0628   HGB 11.5 (L) 05/08/2016 0628   HCT 35.6 (L)  05/08/2016 0628   PLT 341 05/08/2016 0628   MCV 81.8 05/08/2016 0628   MCH 26.4 05/08/2016 0628   MCHC 32.3 05/08/2016 0628   RDW 14.6 05/08/2016 0628   LYMPHSABS 1.7 05/08/2016 0628   MONOABS 1.4 (H) 05/08/2016 0628   EOSABS 0.0 05/08/2016 0628   BASOSABS 0.0 05/08/2016 0628    BMET    Component Value Date/Time   NA 137 05/08/2016 0628   K 3.6 05/08/2016 0628   CL 100 (L) 05/08/2016 0628   CO2 30 05/08/2016 [id number]  GLUCOSE 182 (H) 05/08/2016 0628   BUN 23 (H) 05/08/2016 0628   CREATININE 1.03 (H) 05/08/2016 0628   CALCIUM 8.5 (L) 05/08/2016 0628   GFRNONAA >60 05/08/2016 0628   GFRAA >60 05/08/2016 0628    BNP    Component Value Date/Time   BNP 95.6 05/02/2016 1747    ProBNP    Component Value Date/Time   PROBNP <30.0 04/30/2009 2135    Imaging: Dg Chest 2 View  Result Date: 05/02/2016 CLINICAL DATA:  Asthma with acute exacerbation EXAM: CHEST  2 VIEW COMPARISON:  01/26/2016 FINDINGS: Enlargement of cardiac silhouette. Mediastinal contours and pulmonary vascularity normal. New atelectasis or infiltrate in medial RIGHT lower lobe question new infiltrate. Minimal atelectasis LEFT base. Upper lungs clear. No pleural effusion or pneumothorax. Dextroconvex thoracolumbar scoliosis. IMPRESSION: Minimal LEFT basilar atelectasis with new question atelectasis versus consolidation in medial RIGHT lower lobe. Electronically Signed   By: Lavonia Dana M.D.   On: 05/02/2016 14:41   Dg Chest Port 1 View  Result Date: 05/07/2016 CLINICAL DATA:  Cough. EXAM: PORTABLE CHEST 1 VIEW COMPARISON:  05/02/2016 . FINDINGS: Mediastinum and hilar structures normal. Heart size normal. Low lung volumes with mild basilar atelectasis. No focal infiltrate. No pleural effusion or pneumothorax. IMPRESSION: Low lung volumes with basilar atelectasis. Electronically Signed   By: Marcello Moores  Register   On: 05/07/2016 09:55     Assessment & Plan:   Asthma with exacerbation Severe flare with RSV +  Slowly  resolving , add Spriva  GERD control  Plan  Patient Instructions  Taper off steroids as directed.  Continue on Symbicort Twice daily  .  Add Spiriva 2 puff daily .  Discuss sleep study on return .  Add Pepcid 20mg  At bedtime   GERD diet  Continue on Protonix Twice daily  .  Follow up with Primary MD for hypertension.  Follow up with Dr. Annamaria Boots  In 4 weeks and As needed   Please contact office for sooner follow up if symptoms do not improve or worsen or seek emergency care           GERD Cont PPI  Add Pepcid At bedtime   GERD diet  If not better refer to GI   Morbid (severe) obesity due to excess calories ([place]) Wt loss      Rexene Edison, NP 05/15/2016

## 2016-05-15 NOTE — Telephone Encounter (Signed)
Rec'd FMLA forms via fax from [employer] - fwd to [employer] via interoffice mail-pr  °

## 2016-05-15 NOTE — Assessment & Plan Note (Signed)
Cont PPI  Add Pepcid At bedtime   GERD diet  If not better refer to GI

## 2016-05-15 NOTE — Addendum Note (Signed)
Addended by: Doroteo Glassman D on: 05/15/2016 03:33 PM   Modules accepted: Orders

## 2016-05-15 NOTE — Patient Instructions (Addendum)
Taper off steroids as directed.  Continue on Symbicort Twice daily  .  Add Spiriva 2 puff daily .  Discuss sleep study on return .  Add Pepcid 20mg  At bedtime   GERD diet  Continue on Protonix Twice daily  .  Follow up with Primary MD for hypertension.  Follow up with Dr. Annamaria Boots  In 4 weeks and As needed   Please contact office for sooner follow up if symptoms do not improve or worsen or seek emergency care

## 2016-05-15 NOTE — Assessment & Plan Note (Signed)
Severe flare with RSV +  Slowly resolving , add Spriva  GERD control  Plan  Patient Instructions  Taper off steroids as directed.  Continue on Symbicort Twice daily  .  Add Spiriva 2 puff daily .  Discuss sleep study on return .  Add Pepcid 20mg  At bedtime   GERD diet  Continue on Protonix Twice daily  .  Follow up with Primary MD for hypertension.  Follow up with Dr. Annamaria Boots  In 4 weeks and As needed   Please contact office for sooner follow up if symptoms do not improve or worsen or seek emergency care

## 2016-05-15 NOTE — Assessment & Plan Note (Signed)
Wt loss  

## 2016-05-18 ENCOUNTER — Telehealth: Payer: Self-pay | Admitting: Adult Health

## 2016-05-18 NOTE — Telephone Encounter (Signed)
Spoke with pt, she states her steroid injection site is painful (she received the injection here) and pain is going down her leg. This was given on 05/02/2016 during OV. She states her right  leg is really hurting making it hard for her to stand for long periods of time. She has taken ibuprofen, ice, but no relief. Please advise.  FMLA paperwork was sent to medical records at [phone] on 05/15/2016. I called them to check status. They have paperwork so I called pt to let her know.Pt verbalized understanding.

## 2016-05-18 NOTE — Telephone Encounter (Signed)
Sorry to hear that , that is strange it started 2 weeks after shot but can happen .  Look for signs of redness or red streaks, call back if present  Would place cool compresses along site.  If not going away will need to be seen by PCP to see if additonal testing /tx is indicated.  Please contact office for sooner follow up if symptoms do not improve or worsen or seek emergency care

## 2016-05-18 NOTE — Telephone Encounter (Signed)
Advised pt of message from [place]. Pt verbalized understanding and nothing further is needed.

## 2016-05-22 ENCOUNTER — Telehealth: Payer: PRIVATE HEALTH INSURANCE | Admitting: Family

## 2016-05-22 DIAGNOSIS — R52 Pain, unspecified: Secondary | ICD-10-CM

## 2016-05-22 DIAGNOSIS — T8089XA Other complications following infusion, transfusion and therapeutic injection, initial encounter: Principal | ICD-10-CM

## 2016-05-22 NOTE — Progress Notes (Signed)
Based on what you shared with me it looks like you have a serious condition that should be evaluated in a face to face office visit.  NOTE: Even if you have entered your credit card information for this eVisit, you will not be charged.   If you are having a true medical emergency please call 911.  If you need an urgent face to face visit, [facility] has four urgent care centers for your convenience.  If you need care fast and have a high deductible or no insurance consider:   [email or web address]  [phone]  [address], [address] [place], [place] [address] 8 am to 8 pm Monday-Friday 10 am to 4 pm Saturday-Sunday   The following sites will take your  insurance:    . [facility]  [phone] Get Driving Directions Find a Provider at this Location  [address] , [place] [address] . 10 am to 8 pm Monday-Friday . 12 pm to 8 pm Saturday-Sunday   . [facility]  [phone] Get Driving Directions Find a Provider at this Location  [address], [address] [place], [place] [address] . 8 am to 8 pm Monday-Friday . 9 am to 6 pm Saturday . 11 am to 6 pm Sunday   . [facility] [place]  [phone] Get Driving Directions  [address] [place], [place] [address] . 8 am to 8 pm Monday-Friday . 8 am to 4 pm Saturday-Sunday   Your e-visit answers were reviewed by a board certified advanced clinical practitioner to complete your personal care plan.  Thank you for using e-Visits.  

## 2016-05-24 DIAGNOSIS — K219 Gastro-esophageal reflux disease without esophagitis: Secondary | ICD-10-CM | POA: Diagnosis not present

## 2016-05-24 DIAGNOSIS — R7303 Prediabetes: Secondary | ICD-10-CM | POA: Diagnosis not present

## 2016-05-24 DIAGNOSIS — I10 Essential (primary) hypertension: Secondary | ICD-10-CM | POA: Diagnosis not present

## 2016-05-24 DIAGNOSIS — Z0000 Encounter for general adult medical examination without abnormal findings: Secondary | ICD-10-CM | POA: Diagnosis not present

## 2016-05-24 DIAGNOSIS — J4540 Moderate persistent asthma, uncomplicated: Secondary | ICD-10-CM | POA: Diagnosis not present

## 2016-05-24 DIAGNOSIS — M545 Low back pain: Secondary | ICD-10-CM | POA: Diagnosis not present

## 2016-05-24 DIAGNOSIS — E785 Hyperlipidemia, unspecified: Secondary | ICD-10-CM | POA: Diagnosis not present

## 2016-05-24 DIAGNOSIS — J302 Other seasonal allergic rhinitis: Secondary | ICD-10-CM | POA: Diagnosis not present

## 2016-05-24 MED FILL — AMLODIPINE BESYLATE 10 MG T: 10 | 30 days supply | Qty: 30 | Fill #0

## 2016-05-24 MED FILL — tiZANidine HCL 4 MG TABS: 4 | 10 days supply | Qty: 30 | Fill #0

## 2016-05-25 MED FILL — IBUPROFEN 800 MG TABLET: 800 | 30 days supply | Qty: 90 | Fill #0

## 2016-05-28 ENCOUNTER — Other Ambulatory Visit: Payer: Self-pay | Admitting: *Deleted

## 2016-05-28 NOTE — Patient Outreach (Signed)
[name]  05/28/2016  [name] 09-25-1965 [id number]  No response from patient outreach attempts, will proceed with case closure.   Objective:Per chart review, patient hospitalized 05/02/16 - 05/09/16 for asthma exacerbation. Had ED visit on 12/20/15 for asthma exacerbation. Patient also has a History of hypertension.   Assessment:Received [facility] Transition [facility] referral on 05/04/16. [facility] follow up not completed due to patient being unable to reach and will proceed with case closure.    Plan: RNCM will send case closure due to unable to reach request to [name] at [facility].     [name] H. Annia Friendly, BSN, [place] [facility] Telephonic CM Phone: [phone] Fax: [phone]

## 2016-06-06 DIAGNOSIS — M791 Myalgia: Secondary | ICD-10-CM | POA: Diagnosis not present

## 2016-06-12 DIAGNOSIS — M25552 Pain in left hip: Secondary | ICD-10-CM | POA: Diagnosis not present

## 2016-06-12 DIAGNOSIS — M25551 Pain in right hip: Secondary | ICD-10-CM | POA: Diagnosis not present

## 2016-06-14 DIAGNOSIS — M25551 Pain in right hip: Secondary | ICD-10-CM | POA: Diagnosis not present

## 2016-06-14 DIAGNOSIS — M25552 Pain in left hip: Secondary | ICD-10-CM | POA: Diagnosis not present

## 2016-06-14 MED FILL — HYDROCODON-APAP 7.5-325: 7.5-325 | 5 days supply | Qty: 20 | Fill #0

## 2016-06-18 MED FILL — UREA 40% CREAM: 40 | 30 days supply | Qty: 198 | Fill #2

## 2016-06-18 MED FILL — IBUPROFEN 800 MG TABLET: 800 | 30 days supply | Qty: 90 | Fill #1

## 2016-06-18 MED FILL — AMLODIPINE BESYLATE 10 MG T: 10 | 30 days supply | Qty: 30 | Fill #1

## 2016-06-18 MED FILL — FUROSEMIDE 40 MG TABLET: 40 | 90 days supply | Qty: 90 | Fill #1

## 2016-06-18 MED FILL — ECONAZOLE NITRATE 1% CREAM: 1 | 14 days supply | Qty: 85 | Fill #2

## 2016-06-19 ENCOUNTER — Telehealth: Payer: Self-pay | Admitting: Adult Health

## 2016-06-19 NOTE — Telephone Encounter (Signed)
Rec'd completed forms back from [employer] to [employer] via interoffice mail - 06/19/16 -pr

## 2016-06-20 DIAGNOSIS — M791 Myalgia: Secondary | ICD-10-CM | POA: Diagnosis not present

## 2016-06-20 MED FILL — CYCLOBENZAPRINE 5 MG TABLET: 5 | 20 days supply | Qty: 60 | Fill #0

## 2016-06-26 DIAGNOSIS — Z6841 Body Mass Index (BMI) 40.0 and over, adult: Secondary | ICD-10-CM | POA: Diagnosis not present

## 2016-06-26 DIAGNOSIS — N760 Acute vaginitis: Secondary | ICD-10-CM | POA: Diagnosis not present

## 2016-06-26 DIAGNOSIS — Z01411 Encounter for gynecological examination (general) (routine) with abnormal findings: Secondary | ICD-10-CM | POA: Diagnosis not present

## 2016-06-26 DIAGNOSIS — Z1231 Encounter for screening mammogram for malignant neoplasm of breast: Secondary | ICD-10-CM | POA: Diagnosis not present

## 2016-06-26 DIAGNOSIS — M791 Myalgia: Secondary | ICD-10-CM | POA: Diagnosis not present

## 2016-06-26 DIAGNOSIS — N898 Other specified noninflammatory disorders of vagina: Secondary | ICD-10-CM | POA: Diagnosis not present

## 2016-06-28 DIAGNOSIS — M791 Myalgia: Secondary | ICD-10-CM | POA: Diagnosis not present

## 2016-06-29 ENCOUNTER — Other Ambulatory Visit: Payer: 59

## 2016-06-29 ENCOUNTER — Ambulatory Visit (INDEPENDENT_AMBULATORY_CARE_PROVIDER_SITE_OTHER): Payer: 59 | Admitting: Internal Medicine

## 2016-06-29 ENCOUNTER — Encounter: Payer: Self-pay | Admitting: Internal Medicine

## 2016-06-29 VITALS — BP 128/88 | HR 94 | Ht 63.0 in

## 2016-06-29 DIAGNOSIS — J3089 Other allergic rhinitis: Secondary | ICD-10-CM

## 2016-06-29 DIAGNOSIS — J302 Other seasonal allergic rhinitis: Secondary | ICD-10-CM

## 2016-06-29 DIAGNOSIS — K219 Gastro-esophageal reflux disease without esophagitis: Secondary | ICD-10-CM | POA: Diagnosis not present

## 2016-06-29 DIAGNOSIS — J4541 Moderate persistent asthma with (acute) exacerbation: Secondary | ICD-10-CM

## 2016-06-29 DIAGNOSIS — G4733 Obstructive sleep apnea (adult) (pediatric): Secondary | ICD-10-CM | POA: Diagnosis not present

## 2016-06-29 NOTE — Assessment & Plan Note (Signed)
Emphasized reflux precautions, especially sleeping with head of bed elevated. Continue twice daily acid blocker for now.

## 2016-06-29 NOTE — Assessment & Plan Note (Signed)
Weight loss encouraged. Consider bariatric referral.

## 2016-06-29 NOTE — Progress Notes (Signed)
Subjective:    Patient ID: [name], female    DOB: 1965/10/08, 51 y.o.   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 FVC 2.39/87%, FEV1 2.04/89%, FEV1/FVC 2.04, FEF 25-75 percent 2.59/87%.  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 Uses Symbicort daily, rescue inhaler and nebulizer machine only with flares. Using proton X-denies reflux/heartburn. Speech therapist also told her to swallow a little vinegar daily.  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 Ongoing reflux recognized despite twice a day PPI. Started Spiriva 4 wk f/u for asthma. Breathing has been ok since last visit with TP.  She reports feeling "much better", just tired, after resolving asthma observation this winter.  She can't tell whether Spiriva is helping or not. She continues twice daily acid blocker and cannot feel any reflux. Husband confirms that she snores loudly. She agrees to go forward with sleep study. Some seasonal rhinorrhea now with itching eyes. CXR 05/07/2016 1V- IMPRESSION: Low lung volumes with basilar atelectasis.  ROS-see HPI    + = pos Constitutional:   No-   weight loss, night sweats,  fevers, chills, +fatigue, lassitude. HEENT:   No-  headaches, difficulty swallowing, tooth/dental problems, sore throat,       No-  sneezing, itching, ear ache, nasal congestion, post nasal drip,  CV:  No-   chest pain, orthopnea, PND, swelling in lower extremities, anasarca,                                                    dizziness, palpitations Resp: + shortness of breath with exertion or at rest.                productive cough, non-productive cough,  No- coughing up of blood.                 change in color of mucus.  wheezing.   Skin: No-   rash or lesions. GI:  No-   heartburn, indigestion, abdominal pain, nausea, vomiting,  GU:  MS:  No-   joint pain or swelling.   Neuro-     nothing unusual Psych:  No- change in mood or affect. No depression or anxiety.  No memory loss.  OBJ- Physical Exam General- Alert, Oriented, Affect-appropriate, Distress- none acute, + morbidly obese Skin- rash-none, lesions- none, excoriation- none Lymphadenopathy- none Head- atraumatic            Eyes- Gross vision intact, PERRLA, conjunctivae and secretions clear            Ears- Hearing, canals-normal  Nose- sniffing-none, no-Septal dev, mucus, polyps, erosion, perforation             Throat- Mallampati II , mucosa clear , drainage- none, tonsils- atrophic Neck- flexible , trachea midline, no stridor , thyroid nl, carotid no bruit Chest - symmetrical excursion , unlabored           Heart/CV- RRR , no murmur , no gallop  , no rub, nl s1 s2                           - JVD- none , edema- none, stasis changes- none, varices- none           Lung- clear   wheeze-none, cough +dry,  with deep breath , dullness-none, rub- none           Chest wall-  Abd-  Br/ Gen/ Rectal- Not done, not indicated Extrem- cyanosis- none, clubbing, none, atrophy- none, strength- nl, cane Neuro- grossly intact to observation    Assessment & Plan:

## 2016-06-29 NOTE — Assessment & Plan Note (Signed)
Currently better control. Exacerbation this winter likely viral infection related although reflux remains a chronic possibility as discussed with her. Importance of allergy component unclear at this time. She had failed Xolair in the past but is interested in one of the newer bio similar products. Plan-lab for allergy profile. Continue Spiriva. Continue reflux precautions. We will consider rechecking eosinophil count when she has been off of prednisone longer.

## 2016-06-29 NOTE — Patient Instructions (Signed)
Order- lab- Allergy profile    Dx allergic rhinitis  Order- schedule unattended home sleep test    Dx OSA  Please call as needed

## 2016-07-02 LAB — RESPIRATORY ALLERGY PROFILE REGION II ~~LOC~~
Allergen, A. alternata, m6: <0.1 kU/L
Allergen, C. Herbarum, M2: <0.1 kU/L
Allergen, Cedar tree, t12: <0.1 kU/L
Allergen, Comm Silver Birch, t9: <0.1 kU/L
Allergen, Cottonwood, t14: <0.1 kU/L
Allergen, D pternoyssinus,d7: <0.1 kU/L
Allergen, Mouse Urine Protein, e78: <0.1 kU/L
Allergen, Mulberry, t76: <0.1 kU/L
Allergen, Oak,t7: <0.1 kU/L
Allergen, P. notatum, m1: <0.1 kU/L
Aspergillus fumigatus, m3: <0.1 kU/L
Bermuda Grass: <0.1 kU/L
Box Elder IgE: <0.1 kU/L
Cat Dander: <0.1 kU/L
Cockroach: <0.1 kU/L
Common Ragweed: <0.1 kU/L
D. farinae: <0.1 kU/L
Dog Dander: 0.63 kU/L — ABNORMAL HIGH
Elm IgE: <0.1 kU/L
IgE (Immunoglobulin E), Serum: 41 kU/L (ref <115)
Johnson Grass: <0.1 kU/L
Pecan/Hickory Tree IgE: <0.1 kU/L
Rough Pigweed  IgE: <0.1 kU/L
Sheep Sorrel IgE: <0.1 kU/L
Timothy Grass: 0.24 kU/L — ABNORMAL HIGH

## 2016-07-09 DIAGNOSIS — M791 Myalgia: Secondary | ICD-10-CM | POA: Diagnosis not present

## 2016-07-15 DIAGNOSIS — G4733 Obstructive sleep apnea (adult) (pediatric): Secondary | ICD-10-CM | POA: Diagnosis not present

## 2016-07-20 ENCOUNTER — Other Ambulatory Visit: Payer: Self-pay | Admitting: Internal Medicine

## 2016-07-20 MED FILL — AMLODIPINE BESYLATE 10 MG T: 10 | 30 days supply | Qty: 30 | Fill #2

## 2016-07-20 MED FILL — VANDAZOLE VAGINAL 0.75% GEL: 0.75 | 5 days supply | Qty: 70 | Fill #1

## 2016-07-24 MED FILL — LORazepam 1 MG TABS: 1 | 30 days supply | Qty: 30 | Fill #0

## 2016-07-24 NOTE — Telephone Encounter (Signed)
Ok to refill lorazepam total 6 months

## 2016-07-24 NOTE — Telephone Encounter (Signed)
Patient is asking for a refill on lorazepam 1mg . Last OV was on 06/29/16. Last RX was on 12/21/15 for 30 tablets with 5 refills.    Dr. Annamaria Boots, is it ok for the patient to receive another refill? Patient wishes to use [facility]. Thanks!

## 2016-07-24 NOTE — Telephone Encounter (Signed)
Rx sent in

## 2016-08-01 ENCOUNTER — Other Ambulatory Visit: Payer: Self-pay | Admitting: Internal Medicine

## 2016-08-01 NOTE — Telephone Encounter (Signed)
Ok to refill 

## 2016-08-01 NOTE — Telephone Encounter (Signed)
CY Please advise on refill. Thanks.  

## 2016-08-02 MED FILL — traMADol HCL 50 MG TABS: 50 | 5 days supply | Qty: 30 | Fill #0

## 2016-08-07 DIAGNOSIS — G4733 Obstructive sleep apnea (adult) (pediatric): Secondary | ICD-10-CM | POA: Diagnosis not present

## 2016-08-08 ENCOUNTER — Other Ambulatory Visit: Payer: Self-pay | Admitting: *Deleted

## 2016-08-08 DIAGNOSIS — G4733 Obstructive sleep apnea (adult) (pediatric): Secondary | ICD-10-CM

## 2016-08-31 ENCOUNTER — Ambulatory Visit: Payer: Self-pay | Admitting: Internal Medicine

## 2016-09-10 ENCOUNTER — Telehealth: Payer: Self-pay | Admitting: Internal Medicine

## 2016-09-10 MED ORDER — AZITHROMYCIN 250 MG PO TABS: 250.0000 mg | ORAL_TABLET | ORAL | 0 refills | Status: DC

## 2016-09-10 MED ORDER — HYDROCOD POLST-CPM POLST ER 10-8 MG/5ML PO SUER: 5.0000 mL | Freq: Three times a day (TID) | ORAL | 0 refills | Status: DC | PRN

## 2016-09-10 MED ORDER — PREDNISONE 10 MG PO TABS: ORAL_TABLET | ORAL | 0 refills | Status: DC

## 2016-09-10 MED FILL — predniSONE 10 MG TABS: 10 | 8 days supply | Qty: 20 | Fill #0

## 2016-09-10 MED FILL — AZITHROMYCIN 250 MG TAB: 250 | 5 days supply | Qty: 6 | Fill #0

## 2016-09-10 NOTE — Telephone Encounter (Signed)
Ok Z pak 250 mg, # 6, 2 today then one daily       Prednisone 10 mg, # 20, 4 X 2 DAYS, 3 X 2 DAYS, 2 X 2 DAYS, 1 X 2 DAYS        Ok to refill her cough syrup

## 2016-09-10 NOTE — Telephone Encounter (Signed)
Pt aware of medications being sent to her pharmacy.  Pt aware to come pick up cough syrup Rx Nothing further needed.

## 2016-09-10 NOTE — Telephone Encounter (Signed)
CY  Please Advise-Sick message  Pt called in c/o productive cough with yellow to greenish mucus,she states her peak flow meter has dropped,she has been giving herself breathing treatments, she does have a low temp 101.5, she has some nasal congestion, Denies wheezing. She is requesting prednisone,z pack, as well if she could get a refill of her chlorpheniramine-HYDROcodone.  Allergies  Allergen Reactions  . Influenza A (H1n1) Monoval Vac     Per pt report  . Omalizumab     Arvid Right* REACTION: angioedema-looses airway  . Promethazine Hcl     REACTION: hallucinations   Current Outpatient Prescriptions on File Prior to Visit  Medication Sig Dispense Refill  . albuterol (PROVENTIL) (2.5 MG/3ML) 0.083% nebulizer solution USE 1 VIAL VIA NEBULIZER EVERY 6 HOURS AS NEEDED FOR WHEEZING OR SHORTNESS OF BREATH 90 mL 5  . Azelastine-Fluticasone (DYMISTA) 137-50 MCG/ACT SUSP One puff twice daily ea nostril 23 g 5  . budesonide-formoterol (SYMBICORT) 160-4.5 MCG/ACT inhaler Inhale 2 puffs into the lungs 2 (two) times daily. Rinse mouth 1 Inhaler 5  . chlorpheniramine-HYDROcodone (TUSSIONEX) 10-8 MG/5ML SUER Take 5 mLs by mouth every 8 (eight) hours as needed for cough. 140 mL 0  . cyclobenzaprine (FLEXERIL) 5 MG tablet Take 1 tablet (5 mg total) by mouth 3 (three) times daily as needed for muscle spasms (or migraines). 50 tablet 0  . econazole nitrate 1 % cream As needed  2  . EPINEPHRINE 0.3 mg/0.3 mL IJ SOAJ injection INJECT INTO THIGH FOR SEVERE ASTHMA/ ALLERGIC REACTION 1 Device PRN  . furosemide (LASIX) 40 MG tablet TAKE 1 TABLET BY MOUTH ONCE DAILY AS NEEDED 30 tablet 5  . ibuprofen (ADVIL,MOTRIN) 800 MG tablet Take 1 tablet by mouth daily as needed.  0  . levocetirizine (XYZAL) 5 MG tablet TAKE 1 TABLET BY MOUTH EVERY EVENING 30 tablet 5  . loratadine (CLARITIN) 10 MG tablet Take 1 tablet (10 mg total) by mouth daily.    Marland Kitchen LORazepam (ATIVAN) 1 MG tablet TAKE 1 TABLET BY MOUTH ONCE DAILY AT  BEDTIME AS NEEDED 30 tablet 5  . olopatadine (PATANOL) 0.1 % ophthalmic solution INSTILL 1 DROP INTO BOTH EYES TWO TIMES DAILY AS NEEDED FOR ALLERGIES 5 mL 5  . pantoprazole (PROTONIX) 40 MG tablet Twice daily before meal (Patient taking differently: Take 40 mg by mouth 2 (two) times daily. ) 60 tablet 11  . Spacer/Aero-Holding Chambers (AEROCHAMBER MV) inhaler Use as instructed 1 each 0  . Tiotropium Bromide Monohydrate (SPIRIVA RESPIMAT) 1.25 MCG/ACT AERS Inhale 2 puffs into the lungs daily. 4 g 6  . traMADol (ULTRAM) 50 MG tablet TAKE 1 TABLET BY MOUTH EVERY 4 HOURS AS NEEDED FOR PAIN 30 tablet 5  . TRANSDERM-SCOP, 1.5 MG, 1 MG/3DAYS APPLY 1 PATCH ONTO THE SKIN EVERY 3 DAYS. 10 patch 12  . urea (CARMOL) 40 % CREA As needed  2  . VENTOLIN HFA 108 (90 Base) MCG/ACT inhaler INHALE 2 PUFFS BY MOUTH INTO THE LUNGS EVERY 6 HOURS AS NEEDED FOR WHEEZING 18 g 5   No current facility-administered medications on file prior to visit.

## 2016-09-12 MED FILL — ALBUTEROL 0.083% INHAL SOLN: (2.5 MG/3ML | 7 days supply | Qty: 90 | Fill #0

## 2016-09-19 ENCOUNTER — Encounter: Payer: Self-pay | Admitting: Internal Medicine

## 2016-09-19 ENCOUNTER — Ambulatory Visit (INDEPENDENT_AMBULATORY_CARE_PROVIDER_SITE_OTHER): Payer: 59 | Admitting: Internal Medicine

## 2016-09-19 VITALS — BP 122/80 | HR 85 | Ht 63.0 in | Wt 271.6 lb

## 2016-09-19 DIAGNOSIS — J45901 Unspecified asthma with (acute) exacerbation: Secondary | ICD-10-CM | POA: Diagnosis not present

## 2016-09-19 DIAGNOSIS — J4540 Moderate persistent asthma, uncomplicated: Secondary | ICD-10-CM | POA: Diagnosis not present

## 2016-09-19 DIAGNOSIS — G4733 Obstructive sleep apnea (adult) (pediatric): Secondary | ICD-10-CM | POA: Insufficient documentation

## 2016-09-19 DIAGNOSIS — J40 Bronchitis, not specified as acute or chronic: Secondary | ICD-10-CM

## 2016-09-19 DIAGNOSIS — K219 Gastro-esophageal reflux disease without esophagitis: Secondary | ICD-10-CM

## 2016-09-19 MED ORDER — DOXYCYCLINE HYCLATE 100 MG PO TABS: ORAL_TABLET | ORAL | 0 refills | Status: DC

## 2016-09-19 MED ORDER — LEVALBUTEROL HCL 0.63 MG/3ML IN NEBU
0.6300 mg | INHALATION_SOLUTION | Freq: Once | RESPIRATORY_TRACT | Status: AC
  Administered 2016-09-19: 0.63 mg via RESPIRATORY_TRACT

## 2016-09-19 MED FILL — DOXYCYCLINE HYC 100 MG TAB: 100 | 7 days supply | Qty: 8 | Fill #0

## 2016-09-19 NOTE — Patient Instructions (Signed)
Order- new DME, new CPAP auto 5-20, mask of choice, humidifier, supplies, AirView     Dx OSA   Order- neb xop 0.63     Dx asthma exacerbation                     Depo 61  Script sent for doxycycline  Suggest also Mucinex-DM  And stay well-hydrated

## 2016-09-19 NOTE — Assessment & Plan Note (Signed)
She has not been willing to change her lifestyle sufficiently to accomplish real weight loss. Encouragement is given.

## 2016-09-19 NOTE — Assessment & Plan Note (Signed)
Appropriate discussion and education explaining the importance of weight control and her responsibility to drive safely. I hope she will be able to tolerate CPAP, probably needing nasal pillows initially. We are ordering new start with auto 5-20. A fitted oral appliance would be a second choice.

## 2016-09-19 NOTE — Progress Notes (Signed)
Subjective:    Patient ID: [name], female    DOB: 13-Jan-1966, 51 y.o.   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 FVC 2.39/87%, FEV1 2.04/89%, FEV1/FVC 2.04, FEF 25-75 percent 2.59/87%. 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 Ongoing reflux recognized despite twice a day PPI. Started Spiriva 4 wk f/u for asthma. Breathing has been ok since last visit with TP.  She reports feeling "much better", just tired, after resolving asthma observation this winter.  She can't tell whether Spiriva is helping or not. She continues twice daily acid blocker and cannot feel any reflux. Husband confirms that she snores loudly. She agrees to go forward with sleep study. Some seasonal rhinorrhea now with itching eyes. CXR 05/07/2016 1V- IMPRESSION: Low lung volumes with basilar atelectasis.  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 She doesn't like the idea of "anything on my face" but is willing to consider CPAP as best first choice, with nasal  pillows. Weight loss is again encouraged. We discussed the fitted oral appliance as an alternative. Acute issue today-10 days acute illness with head and chest congestion, probable fever in the first day or 2. Coughing less now and sputum is thinner but still yellow. No blood or adenopathy. Peak flow is down to 275 and she usually feels in trouble with rates below 300. She has finished a Z-Pak and prednisone taper. Still has some Tussionex.  ROS-see HPI    + = pos Constitutional:   No-   weight loss, night sweats, fevers, chills, +fatigue, lassitude. HEENT:   No-  headaches, difficulty swallowing, tooth/dental problems, sore throat,       No-  sneezing, itching, ear ache, nasal congestion, post nasal drip,  CV:  No-   chest pain, orthopnea, PND, swelling in lower extremities, anasarca,                                                    dizziness, palpitations Resp: + shortness of breath with exertion or at rest.                + productive cough, non-productive cough,  No- coughing up of blood.                 + change in color of mucus.  wheezing.   Skin: No-   rash or lesions. GI:  No-   heartburn, indigestion, abdominal pain, nausea, vomiting,  GU:  MS:  No-   joint pain or swelling.   Neuro-     nothing unusual Psych:  No- change in mood or affect. No depression or anxiety.  No memory loss.  OBJ- Physical Exam General- Alert, Oriented, Affect-appropriate, Distress- none acute, + morbidly obese Skin- rash-none, lesions- none, excoriation- none Lymphadenopathy- none Head- atraumatic            Eyes- Gross vision intact, PERRLA, conjunctivae and secretions clear            Ears- Hearing, canals-normal            Nose- sniffing-none, no-Septal dev, mucus, polyps, erosion, perforation             Throat- Mallampati II , mucosa clear , drainage- none, tonsils- atrophic Neck- flexible , trachea midline, no stridor , thyroid nl, carotid no bruit Chest - symmetrical excursion , unlabored            Heart/CV- RRR , no murmur , no gallop  , no rub, nl s1 s2                           - JVD- none , edema- none, stasis changes- none, varices- none           Lung-   wheeze +, cough +dry/  with deep breath , dullness-none, rub- none           Chest wall-  Abd-  Br/ Gen/ Rectal- Not done, not indicated Extrem- cyanosis- none, clubbing, none, atrophy- none, strength- nl, cane Neuro- grossly intact to observation    Assessment & Plan:

## 2016-09-19 NOTE — Assessment & Plan Note (Signed)
Acute asthmatic bronchitis-nonspecific. Plan-Depo-Medrol, nebulizer treatments Xopenex, doxycycline, Mucinex DM

## 2016-09-19 NOTE — Assessment & Plan Note (Signed)
We reinforced the importance of careful reflux precautions again at this visit.

## 2016-10-11 MED FILL — EPINEPHRINE 0.3 MG AUTO-INJ: 0.3 | 2 days supply | Qty: 2 | Fill #0

## 2016-10-11 MED FILL — CYCLOBENZAPRINE 5 MG TABLET: 5 | 20 days supply | Qty: 60 | Fill #0

## 2016-10-11 MED FILL — AMLODIPINE BESYLATE 10 MG T: 10 | 30 days supply | Qty: 30 | Fill #3

## 2016-10-11 MED FILL — FUROSEMIDE 40 MG TABLET: 40 | 30 days supply | Qty: 30 | Fill #0

## 2016-10-11 MED FILL — IBUPROFEN 800 MG TAB: 800 | 30 days supply | Qty: 90 | Fill #0

## 2016-10-11 MED FILL — LORazepam 1 MG TABS: 1 | 30 days supply | Qty: 30 | Fill #1

## 2016-10-11 MED FILL — TERCONAZOLE 0.4% VAG CREAM: 0.4 | 7 days supply | Qty: 45 | Fill #0

## 2016-10-18 MED FILL — TERCONAZOLE 0.4% VAG CREAM: 0.4 | 7 days supply | Qty: 45 | Fill #0

## 2016-10-25 MED FILL — VANDAZOLE VAGINAL 0.75% GEL: 0.75 | 5 days supply | Qty: 70 | Fill #0

## 2016-10-25 MED FILL — TUSSIONEX PENNKINETIC ER 10: 10-8 | 8 days supply | Qty: 115 | Fill #0

## 2016-11-06 IMAGING — DX DG CHEST 2V
2 series · 2 of 2 positions shown · non-contrast
Comparison: 12/20/2015

CLINICAL DATA: Mild persistent asthma exacerbation 1 week

EXAM:
CHEST  2 VIEW

[chest pa]
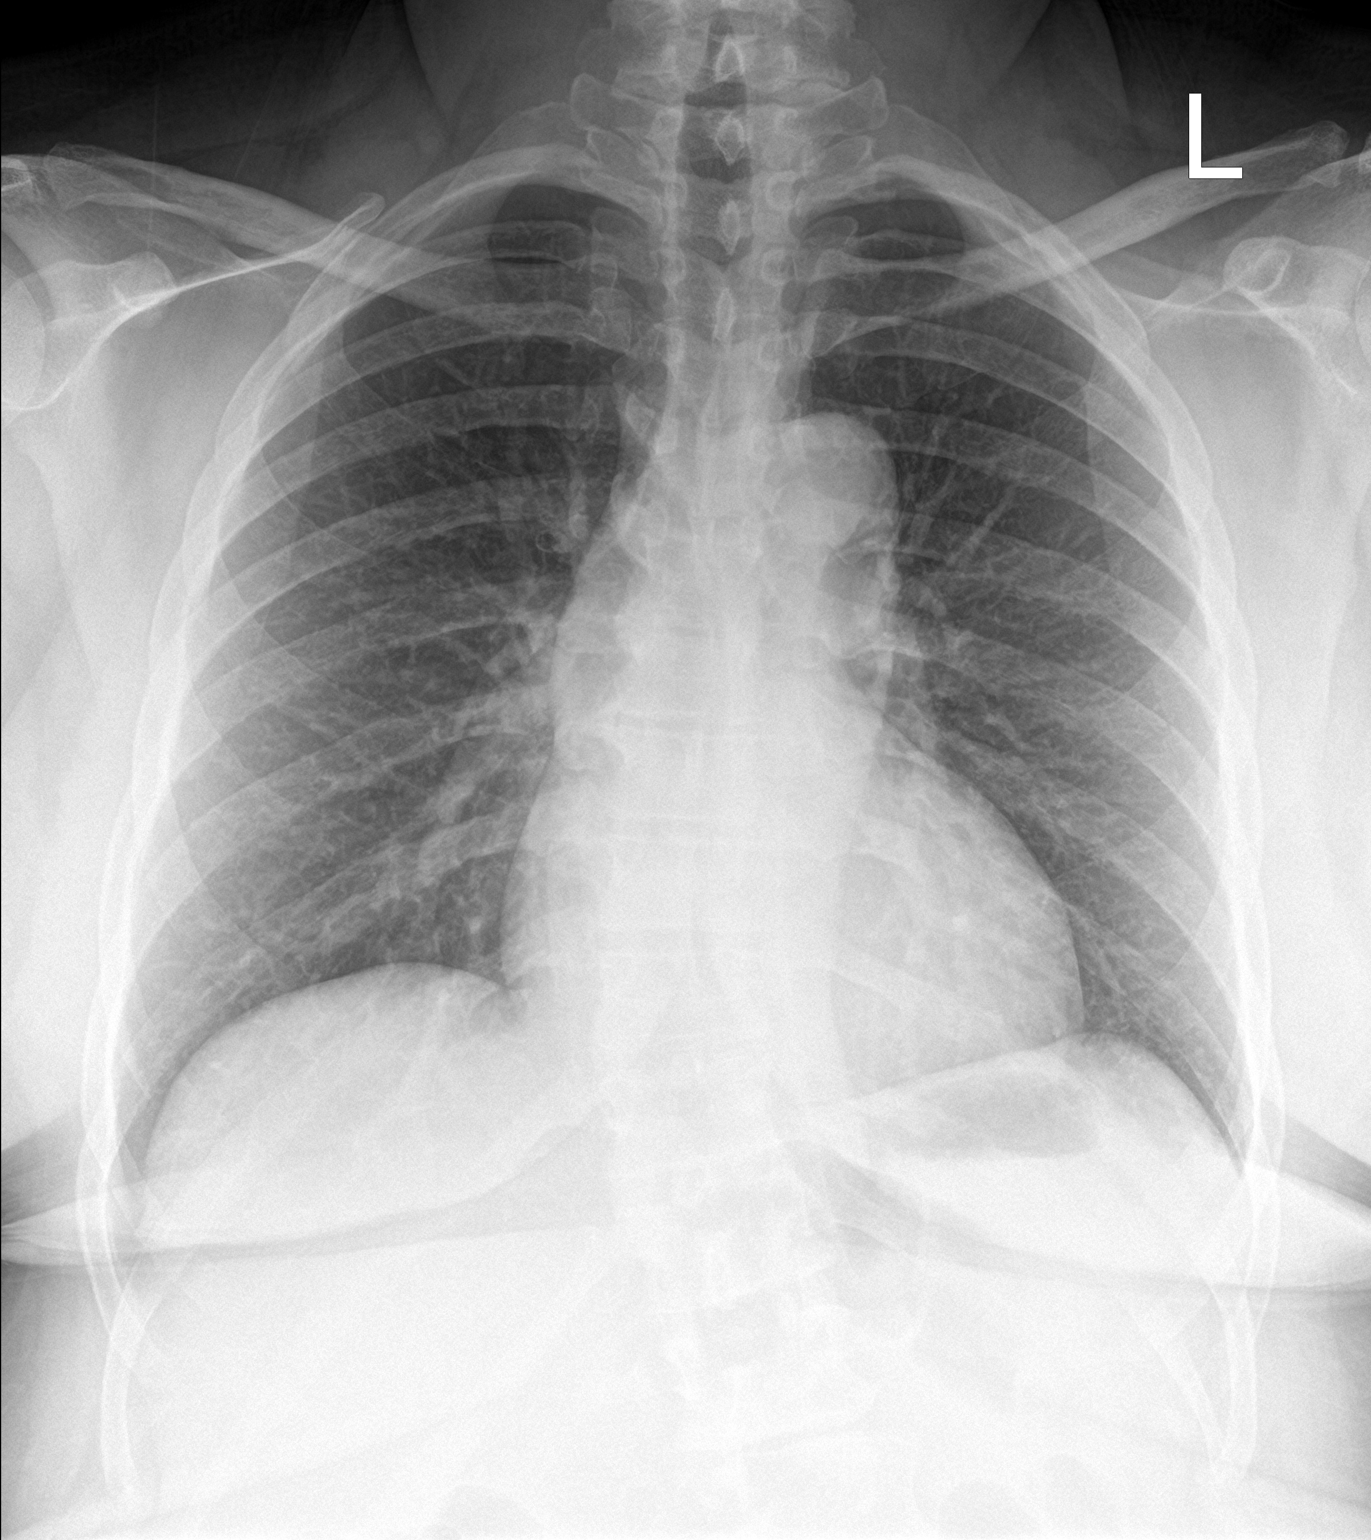

[chest lat]
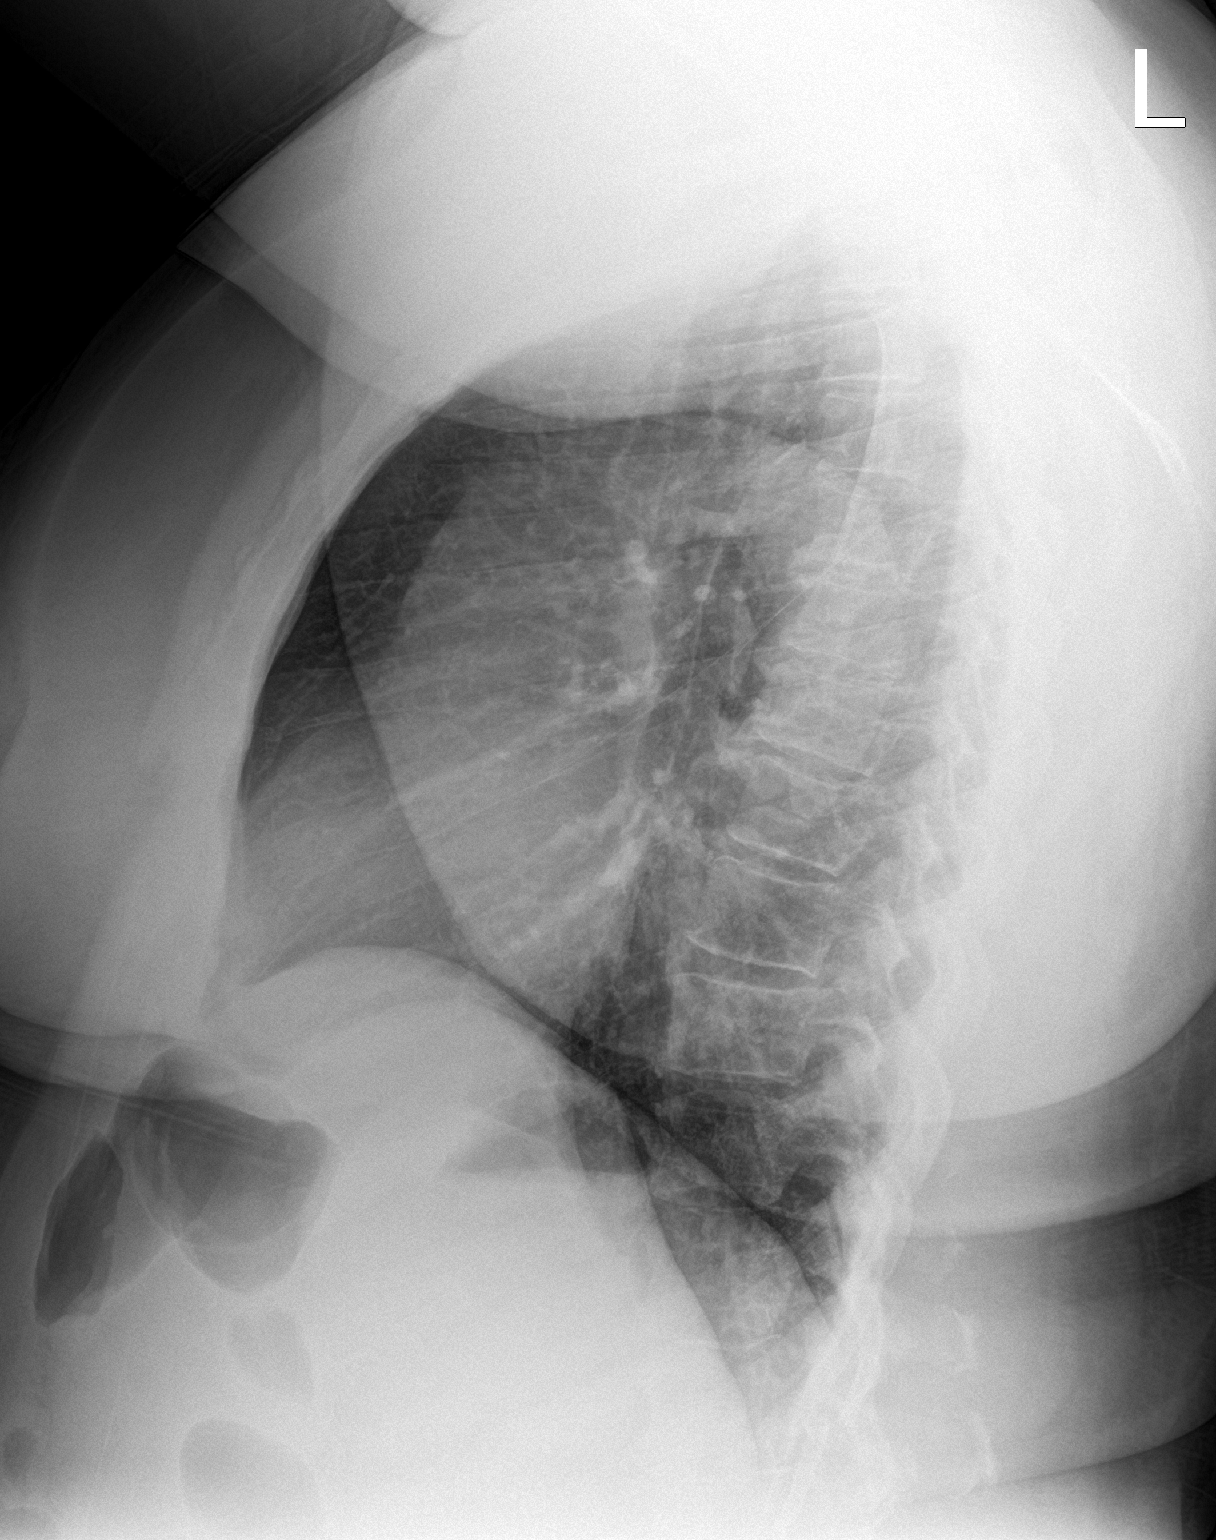

[2 of 2 positions shown; findings below may reference images not displayed]

FINDINGS: Upper normal heart size.

Mediastinal contours and pulmonary vascularity normal.

Minimal central peribronchial thickening consistent with history of
asthma.

No acute infiltrate, pleural effusion or pneumothorax.

Biconvex thoracolumbar scoliosis.
IMPRESSION: No acute abnormalities.

## 2016-11-26 MED FILL — traMADol HCL 50 MG TABS: 50 | 5 days supply | Qty: 30 | Fill #1

## 2016-12-17 MED FILL — VANDAZOLE VAGINAL 0.75% GEL: 0.75 | 5 days supply | Qty: 70 | Fill #1

## 2016-12-17 MED FILL — AMLODIPINE BESYLATE 10 MG T: 10 | 30 days supply | Qty: 30 | Fill #4

## 2016-12-17 MED FILL — FUROSEMIDE 40 MG TAB: 40 | 30 days supply | Qty: 30 | Fill #1

## 2016-12-17 MED FILL — ALBUTEROL 0.083% INHAL SOLN: (2.5 MG/3ML | 7 days supply | Qty: 90 | Fill #1

## 2016-12-17 MED FILL — LORazepam 1 MG TABS: 1 | 30 days supply | Qty: 30 | Fill #2

## 2016-12-17 MED FILL — IBUPROFEN 800 MG TAB: 800 | 30 days supply | Qty: 90 | Fill #1

## 2016-12-17 MED FILL — EPINEPHRINE 0.3 MG AUTO-INJ: 0.3 | 2 days supply | Qty: 2 | Fill #1

## 2016-12-17 MED FILL — traMADol HCL 50 MG TABS: 50 | 5 days supply | Qty: 30 | Fill #2

## 2016-12-20 ENCOUNTER — Encounter: Payer: Self-pay | Admitting: Internal Medicine

## 2016-12-20 ENCOUNTER — Ambulatory Visit (INDEPENDENT_AMBULATORY_CARE_PROVIDER_SITE_OTHER): Payer: 59 | Admitting: Internal Medicine

## 2016-12-20 DIAGNOSIS — G4733 Obstructive sleep apnea (adult) (pediatric): Secondary | ICD-10-CM | POA: Diagnosis not present

## 2016-12-20 DIAGNOSIS — J3089 Other allergic rhinitis: Secondary | ICD-10-CM | POA: Diagnosis not present

## 2016-12-20 DIAGNOSIS — J4520 Mild intermittent asthma, uncomplicated: Secondary | ICD-10-CM | POA: Diagnosis not present

## 2016-12-20 DIAGNOSIS — J302 Other seasonal allergic rhinitis: Secondary | ICD-10-CM | POA: Diagnosis not present

## 2016-12-20 MED ORDER — HYDROCOD POLST-CPM POLST ER 10-8 MG/5ML PO SUER: 5.0000 mL | Freq: Three times a day (TID) | ORAL | 0 refills | Status: DC | PRN

## 2016-12-20 MED FILL — TUSSIONEX PENNKINETIC ER 10: 10-8 | 7 days supply | Qty: 115 | Fill #0

## 2016-12-20 NOTE — Patient Instructions (Signed)
Script printed for tussionex  Suggest you use your Flonase 1-2 puffs each nostril once daily at bedtime if needed, through the fall pollen season.  Consider the otc nasal saline gel as often as needed for dry or irritated nose  Please try to get in the habit of using your CPAP whenever you are sleeping. Not using it is like not using a blood pressure pill.

## 2016-12-20 NOTE — Progress Notes (Signed)
Subjective:    Patient ID: [name], female    DOB: 06-Apr-1966, 51 y.o.   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 FVC 2.39/87%, FEV1 2.04/89%, FEV1/FVC 2.04, FEF 25-75 percent 2.59/87%. Unattended Home Sleep Test 07/15/16-AHI 13.9/hour, desaturation to 74%, body weight 240 pounds -----------------------------------------------------------------------.  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 She doesn't like the idea of "anything on my face" but is willing to consider CPAP as best first choice, with nasal pillows. Weight loss is again encouraged. We discussed the fitted oral appliance as an alternative. Acute issue today-10 days acute illness with head and chest congestion, probable fever in the first day or 2. Coughing less now and sputum is thinner but still yellow. No blood or adenopathy. Peak flow is down to 275 and she usually feels in trouble with rates below 300. She has finished a Z-Pak and prednisone taper. Still has some Tussionex.  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  She blames allergies for sniffles  despite taking Claritin in the morning and sizable at night. Says she feels better if she uses CPAP falls asleep TV first. History of adverse reaction and does not get flu shot No recent asthma and no need for rescue inhaler or nocturnal awakening. She is looking forward to a family [place] saying she breathes better there.  ROS-see HPI    + = pos Constitutional:   No-   weight loss, night sweats, fevers, chills, +fatigue, lassitude. HEENT:   No-  headaches, difficulty swallowing, tooth/dental problems, sore throat,       No-  sneezing, itching, ear ache, nasal congestion, post nasal drip,  CV:  No-   chest pain, orthopnea, PND, swelling in lower extremities, anasarca,                                                    dizziness, palpitations Resp: + shortness of breath with exertion or at rest.                + productive cough, non-productive cough,  No- coughing up of blood.                 + change in color of mucus.  wheezing.   Skin: No-   rash or lesions. GI:  No-   heartburn, indigestion, abdominal pain, nausea, vomiting,  GU:  MS:  No-   joint pain or  swelling.   Neuro-     nothing unusual Psych:  No- change in mood or affect. No depression or anxiety.  No memory loss.  OBJ- Physical Exam General- Alert, Oriented, Affect-appropriate, Distress- none acute, + morbidly obese Skin- rash-none, lesions- none, excoriation- none Lymphadenopathy- none Head- atraumatic            Eyes- Gross vision intact, PERRLA, conjunctivae and secretions clear            Ears- Hearing, canals-normal            Nose- sniffing-none, no-Septal dev, mucus, polyps, erosion, perforation             Throat- Mallampati II , mucosa clear , drainage- none, tonsils- atrophic Neck- flexible , trachea midline, no stridor , thyroid nl, carotid no bruit Chest - symmetrical excursion , unlabored           Heart/CV- RRR , no murmur , no gallop  , no rub, nl s1 s2                           - JVD- none ,  edema- none, stasis changes- none, varices- none           Lung-   wheeze -none, cough-none , dullness-none, rub- none           Chest wall-  Abd-  Br/ Gen/ Rectal- Not done, not indicated Extrem- cyanosis- none, clubbing, none, atrophy- none, strength- nl, cane Neuro- grossly intact to observation    Assessment & Plan:

## 2016-12-23 NOTE — Assessment & Plan Note (Signed)
She is currently doing quite well, under good control. In discussing needs for upcoming fall and winter seasons, after appropriate discussion, I did agree to refill Tussionex to have on hand.

## 2016-12-23 NOTE — Assessment & Plan Note (Signed)
Rhinitis exacerbation consistent with ragweed season. Plan-she has Flonase and is encouraged to add it to her antihistamine use for now through the season.

## 2016-12-23 NOTE — Assessment & Plan Note (Signed)
Staff her working to get download report. She says she falls asleep in front of TV before she puts mask on sometimes. We discussed injuries of lifestyle to improve CPAP compliance. I think she is benefiting and will continue to use CPAP.

## 2017-01-18 MED FILL — AMLODIPINE BESYLATE 10 MG T: 10 | 30 days supply | Qty: 30 | Fill #5

## 2017-01-18 MED FILL — CYCLOBENZAPRINE 5 MG TABLET: 5 | 20 days supply | Qty: 60 | Fill #0

## 2017-01-18 MED FILL — TERCONAZOLE 0.4% VAG CREAM: 0.4 | 7 days supply | Qty: 45 | Fill #1

## 2017-01-18 MED FILL — LORazepam 1 MG TABS: 1 | 30 days supply | Qty: 30 | Fill #3

## 2017-01-18 MED FILL — FUROSEMIDE 40 MG TABS: 40 | 30 days supply | Qty: 30 | Fill #2

## 2017-01-18 MED FILL — metroNIDAZOLE 0.75 % GEL: 0.75 | 5 days supply | Qty: 70 | Fill #2

## 2017-01-18 MED FILL — ALBUTEROL 0.083% INHAL SOLN: (2.5 MG/3ML | 7 days supply | Qty: 90 | Fill #2

## 2017-01-21 MED FILL — traMADol HCL 50 MG TABS: 50 | 5 days supply | Qty: 30 | Fill #3

## 2017-02-25 ENCOUNTER — Other Ambulatory Visit: Payer: Self-pay | Admitting: Internal Medicine

## 2017-02-25 NOTE — Telephone Encounter (Signed)
Ok refill 6 months 

## 2017-02-25 NOTE — Telephone Encounter (Signed)
CY Please advise on refill. Thanks.  

## 2017-02-26 ENCOUNTER — Other Ambulatory Visit: Payer: Self-pay

## 2017-02-26 MED FILL — traMADol HCL 50 MG TABS: 50 | 5 days supply | Qty: 30 | Fill #0

## 2017-02-26 NOTE — Telephone Encounter (Signed)
Received refill request for Tramodol 50mg  from [facility].  Refill request has been denied, as Rx was refilled on 02/25/17. Nothing further needed.

## 2017-02-28 ENCOUNTER — Other Ambulatory Visit: Payer: Self-pay | Admitting: Internal Medicine

## 2017-02-28 MED FILL — IBUPROFEN 800 MG TAB: 800 | 30 days supply | Qty: 90 | Fill #0

## 2017-02-28 MED FILL — ALBUTEROL 0.083% INHAL SOLN: (2.5 MG/3ML | 7 days supply | Qty: 90 | Fill #3

## 2017-02-28 MED FILL — CYCLOBENZAPRINE 5 MG TABLET: 5 | 20 days supply | Qty: 60 | Fill #0

## 2017-02-28 NOTE — Telephone Encounter (Signed)
Rx request Ativan 1mg , #30 tabs with 5 RF.  Pt last seen 12/20/16 and med last refilled 07/24/16.  CY, please advise if it is okay for [place] to refill this med.  Thanks!  Allergies  Allergen Reactions  . Influenza A (H1n1) Monoval Vac     Reaction: systemic reaction  . Omalizumab     Arvid Right* REACTION: angioedema-looses airway  . Promethazine Hcl     REACTION: hallucinations     Current Outpatient Medications:  .  albuterol (PROVENTIL) (2.5 MG/3ML) 0.083% nebulizer solution, USE 1 VIAL VIA NEBULIZER EVERY 6 HOURS AS NEEDED FOR WHEEZING OR SHORTNESS OF BREATH, Disp: 90 mL, Rfl: 5 .  Azelastine-Fluticasone (DYMISTA) 137-50 MCG/ACT SUSP, One puff twice daily ea nostril, Disp: 23 g, Rfl: 5 .  budesonide-formoterol (SYMBICORT) 160-4.5 MCG/ACT inhaler, Inhale 2 puffs into the lungs 2 (two) times daily. Rinse mouth, Disp: 1 Inhaler, Rfl: 5 .  chlorpheniramine-HYDROcodone (TUSSIONEX) 10-8 MG/5ML SUER, Take 5 mLs by mouth every 8 (eight) hours as needed for cough., Disp: 140 mL, Rfl: 0 .  cyclobenzaprine (FLEXERIL) 5 MG tablet, Take 1 tablet (5 mg total) by mouth 3 (three) times daily as needed for muscle spasms (or migraines)., Disp: 50 tablet, Rfl: 0 .  econazole nitrate 1 % cream, As needed, Disp: , Rfl: 2 .  EPINEPHRINE 0.3 mg/0.3 mL IJ SOAJ injection, INJECT INTO THIGH FOR SEVERE ASTHMA/ ALLERGIC REACTION, Disp: 1 Device, Rfl: PRN .  furosemide (LASIX) 40 MG tablet, TAKE 1 TABLET BY MOUTH ONCE DAILY AS NEEDED, Disp: 30 tablet, Rfl: 5 .  ibuprofen (ADVIL,MOTRIN) 800 MG tablet, Take 1 tablet by mouth daily as needed., Disp: , Rfl: 0 .  levocetirizine (XYZAL) 5 MG tablet, TAKE 1 TABLET BY MOUTH EVERY EVENING, Disp: 30 tablet, Rfl: 5 .  loratadine (CLARITIN) 10 MG tablet, Take 1 tablet (10 mg total) by mouth daily., Disp: , Rfl:  .  LORazepam (ATIVAN) 1 MG tablet, TAKE 1 TABLET BY MOUTH ONCE DAILY AT BEDTIME AS NEEDED, Disp: 30 tablet, Rfl: 5 .  olopatadine (PATANOL) 0.1 % ophthalmic solution, INSTILL  1 DROP INTO BOTH EYES TWO TIMES DAILY AS NEEDED FOR ALLERGIES, Disp: 5 mL, Rfl: 5 .  pantoprazole (PROTONIX) 40 MG tablet, Twice daily before meal (Patient taking differently: Take 40 mg by mouth 2 (two) times daily. ), Disp: 60 tablet, Rfl: 11 .  Spacer/Aero-Holding Chambers (AEROCHAMBER MV) inhaler, Use as instructed, Disp: 1 each, Rfl: 0 .  Tiotropium Bromide Monohydrate (SPIRIVA RESPIMAT) 1.25 MCG/ACT AERS, Inhale 2 puffs into the lungs daily., Disp: 4 g, Rfl: 6 .  traMADol (ULTRAM) 50 MG tablet, TAKE 1 TABLET BY MOUTH EVERY 4 HOURS AS NEEDED FOR PAIN, Disp: 30 tablet, Rfl: 5 .  TRANSDERM-SCOP, 1.5 MG, 1 MG/3DAYS, APPLY 1 PATCH ONTO THE SKIN EVERY 3 DAYS., Disp: 10 patch, Rfl: 12 .  urea (CARMOL) 40 % CREA, As needed, Disp: , Rfl: 2 .  VENTOLIN HFA 108 (90 Base) MCG/ACT inhaler, INHALE 2 PUFFS BY MOUTH INTO THE LUNGS EVERY 6 HOURS AS NEEDED FOR WHEEZING, Disp: 18 g, Rfl: 5

## 2017-02-28 NOTE — Telephone Encounter (Signed)
Ok to refill 

## 2017-03-01 MED FILL — LORazepam 1 MG TABS: 1 | 30 days supply | Qty: 30 | Fill #0

## 2017-03-22 MED FILL — traMADol HCL 50 MG TABS: 50 | 5 days supply | Qty: 30 | Fill #1

## 2017-03-22 MED FILL — ALBUTEROL 0.083% INHAL SOLN: (2.5 MG/3ML | 7 days supply | Qty: 90 | Fill #4

## 2017-04-01 ENCOUNTER — Ambulatory Visit: Payer: Self-pay | Admitting: Internal Medicine

## 2017-04-26 MED FILL — traMADol HCL 50 MG TABS: 50 | 5 days supply | Qty: 30 | Fill #2

## 2017-05-17 MED FILL — IBUPROFEN 800 MG TAB: 800 | 30 days supply | Qty: 90 | Fill #1

## 2017-05-17 MED FILL — ALBUTEROL 0.083% INHAL SOLN: (2.5 MG/3ML | 7 days supply | Qty: 90 | Fill #5

## 2017-05-17 MED FILL — LORazepam 1 MG TABS: 1 | 30 days supply | Qty: 30 | Fill #1

## 2017-05-17 MED FILL — traMADol HCL 50 MG TABS: 50 | 5 days supply | Qty: 30 | Fill #3

## 2017-06-10 ENCOUNTER — Other Ambulatory Visit: Payer: Self-pay | Admitting: Internal Medicine

## 2017-06-10 MED FILL — IBUPROFEN 800 MG TAB: 800 | 30 days supply | Qty: 90 | Fill #0

## 2017-06-10 MED FILL — CYCLOBENZAPRINE 5 MG TABLET: 5 | 30 days supply | Qty: 90 | Fill #0

## 2017-06-10 MED FILL — TERCONAZOLE 0.4% VAG CREAM: 0.4 | 7 days supply | Qty: 45 | Fill #2

## 2017-06-10 MED FILL — FUROSEMIDE 40 MG TAB: 40 | 30 days supply | Qty: 30 | Fill #3

## 2017-06-11 MED FILL — ALBUTEROL 0.083% INHAL SOLN: (2.5 MG/3ML | 7 days supply | Qty: 90 | Fill #0

## 2017-06-14 MED FILL — traMADol HCL 50 MG TABS: 50 | 5 days supply | Qty: 30 | Fill #4

## 2017-07-03 DIAGNOSIS — Z1231 Encounter for screening mammogram for malignant neoplasm of breast: Secondary | ICD-10-CM | POA: Diagnosis not present

## 2017-07-03 DIAGNOSIS — Z01411 Encounter for gynecological examination (general) (routine) with abnormal findings: Secondary | ICD-10-CM | POA: Diagnosis not present

## 2017-07-03 DIAGNOSIS — Z634 Disappearance and death of family member: Secondary | ICD-10-CM | POA: Diagnosis not present

## 2017-07-04 DIAGNOSIS — Z131 Encounter for screening for diabetes mellitus: Secondary | ICD-10-CM | POA: Diagnosis not present

## 2017-07-04 DIAGNOSIS — Z136 Encounter for screening for cardiovascular disorders: Secondary | ICD-10-CM | POA: Diagnosis not present

## 2017-07-04 DIAGNOSIS — J4540 Moderate persistent asthma, uncomplicated: Secondary | ICD-10-CM | POA: Diagnosis not present

## 2017-07-04 DIAGNOSIS — R7303 Prediabetes: Secondary | ICD-10-CM | POA: Diagnosis not present

## 2017-07-04 DIAGNOSIS — I10 Essential (primary) hypertension: Secondary | ICD-10-CM | POA: Diagnosis not present

## 2017-07-04 DIAGNOSIS — K219 Gastro-esophageal reflux disease without esophagitis: Secondary | ICD-10-CM | POA: Diagnosis not present

## 2017-07-04 DIAGNOSIS — Z0000 Encounter for general adult medical examination without abnormal findings: Secondary | ICD-10-CM | POA: Diagnosis not present

## 2017-07-04 DIAGNOSIS — E785 Hyperlipidemia, unspecified: Secondary | ICD-10-CM | POA: Diagnosis not present

## 2017-07-04 DIAGNOSIS — Z01118 Encounter for examination of ears and hearing with other abnormal findings: Secondary | ICD-10-CM | POA: Diagnosis not present

## 2017-07-05 MED FILL — LEVALBUTEROL 1.25 MG/0.5 ML: 1.25 | 30 days supply | Qty: 90 | Fill #0

## 2017-07-05 MED FILL — PANTOPRAZOLE SOD DR 40 MG T: 40 | 90 days supply | Qty: 90 | Fill #0

## 2017-07-05 MED FILL — LEVOCETIRIZINE 5 MG TABLET: 5 | 90 days supply | Qty: 90 | Fill #0

## 2017-07-05 MED FILL — FLUTICASONE PROP 50 MCG SPR: 50 | 30 days supply | Qty: 16 | Fill #0

## 2017-07-05 MED FILL — AMLODIPINE BESYLATE 10 MG T: 10 | 30 days supply | Qty: 30 | Fill #0

## 2017-07-05 MED FILL — FUROSEMIDE 40 MG TAB: 40 | 90 days supply | Qty: 90 | Fill #0

## 2017-07-16 DIAGNOSIS — J302 Other seasonal allergic rhinitis: Secondary | ICD-10-CM | POA: Diagnosis not present

## 2017-07-16 DIAGNOSIS — K219 Gastro-esophageal reflux disease without esophagitis: Secondary | ICD-10-CM | POA: Diagnosis not present

## 2017-07-16 DIAGNOSIS — G4733 Obstructive sleep apnea (adult) (pediatric): Secondary | ICD-10-CM | POA: Diagnosis not present

## 2017-07-16 DIAGNOSIS — I10 Essential (primary) hypertension: Secondary | ICD-10-CM | POA: Diagnosis not present

## 2017-07-16 DIAGNOSIS — J4540 Moderate persistent asthma, uncomplicated: Secondary | ICD-10-CM | POA: Diagnosis not present

## 2017-07-16 DIAGNOSIS — R7303 Prediabetes: Secondary | ICD-10-CM | POA: Diagnosis not present

## 2017-07-16 DIAGNOSIS — E785 Hyperlipidemia, unspecified: Secondary | ICD-10-CM | POA: Diagnosis not present

## 2017-07-18 ENCOUNTER — Ambulatory Visit: Payer: Self-pay | Admitting: Internal Medicine

## 2017-09-02 ENCOUNTER — Telehealth: Payer: Self-pay | Admitting: Gastroenterology

## 2017-09-03 NOTE — Telephone Encounter (Signed)
The pt has been scheduled to see Dr Carlean Purl to to discuss banding.

## 2017-09-17 ENCOUNTER — Telehealth: Payer: Self-pay | Admitting: Internal Medicine

## 2017-09-17 DIAGNOSIS — G4733 Obstructive sleep apnea (adult) (pediatric): Secondary | ICD-10-CM

## 2017-09-17 NOTE — Telephone Encounter (Signed)
She will need new HST and then ov (she missed last appointment, so this needs to be a routine ov, not a work in_.

## 2017-09-17 NOTE — Telephone Encounter (Signed)
Attempted to call Patient to explained she needs a new HST and OV.  Unable to leave message, mailbox is full.  Will try again a later time.

## 2017-09-17 NOTE — Telephone Encounter (Signed)
[name] with [facility] stated that Patient has called wanting a CPAP. She still owes $20 for nebulizer, so [facility] will not supply her CPAP.  Her last OV was 12/20/16.  [name] said, according to her insurance, her orders have ran out. Sleep study has to be within 1 year and hers was 07/15/16. For a CPAP she must be seen in last 6 months. Would it be ok to see if she would like a new sleep study, OV, and try a different DME.  CY please advise

## 2017-09-19 NOTE — Telephone Encounter (Signed)
Called and spoke to patient,  has appointment scheduled for July. Ordered HST per CY. Nothing further needed.

## 2017-09-20 ENCOUNTER — Ambulatory Visit (INDEPENDENT_AMBULATORY_CARE_PROVIDER_SITE_OTHER): Payer: 59 | Admitting: Internal Medicine

## 2017-09-20 ENCOUNTER — Other Ambulatory Visit: Payer: Self-pay | Admitting: Internal Medicine

## 2017-09-20 ENCOUNTER — Encounter: Payer: Self-pay | Admitting: Internal Medicine

## 2017-09-20 VITALS — BP 144/88 | HR 84 | Ht 62.0 in

## 2017-09-20 DIAGNOSIS — K641 Second degree hemorrhoids: Secondary | ICD-10-CM

## 2017-09-20 NOTE — Patient Instructions (Signed)
HEMORRHOID BANDING PROCEDURE    FOLLOW-UP CARE   1. The procedure you have had should have been relatively painless since the banding of the area involved does not have nerve endings and there is no pain sensation.  The rubber band cuts off the blood supply to the hemorrhoid and the band may fall off as soon as 48 hours after the banding (the band may occasionally be seen in the toilet bowl following a bowel movement). You may notice a temporary feeling of fullness in the rectum which should respond adequately to plain Tylenol or Motrin.  2. Following the banding, avoid strenuous exercise that evening and resume full activity the next day.  A sitz bath (soaking in a warm tub) or bidet is soothing, and can be useful for cleansing the area after bowel movements.     3. To avoid constipation, take two tablespoons of natural wheat bran, natural oat bran, flax, Benefiber or any over the counter fiber supplement and increase your water intake to 7-8 glasses daily.    4. Unless you have been prescribed anorectal medication, do not put anything inside your rectum for two weeks: No suppositories, enemas, fingers, etc.  5. Occasionally, you may have more bleeding than usual after the banding procedure.  This is often from the untreated hemorrhoids rather than the treated one.  Don't be concerned if there is a tablespoon or so of blood.  If there is more blood than this, lie flat with your bottom higher than your head and apply an ice pack to the area. If the bleeding does not stop within a half an hour or if you feel faint, call our office at [phone] or go to the emergency room.  6. Problems are not common; however, if there is a substantial amount of bleeding, severe pain, chills, fever or difficulty passing urine (very rare) or other problems, you should call us at [phone] or report to the nearest emergency room.  7. Do not stay seated continuously for more than 2-3 hours for a day or two  after the procedure.  Tighten your buttock muscles 10-15 times every two hours and take 10-15 deep breaths every 1-2 hours.  Do not spend more than a few minutes on the toilet if you cannot empty your bowel; instead re-visit the toilet at a later time.    Please come back to see [place] on 10/11/17 at 3:30pm.    I appreciate the opportunity to care for you. Silvano Rusk, MD, [facility]

## 2017-09-20 NOTE — Telephone Encounter (Signed)
CY Please advise on refill. Thanks.  

## 2017-09-22 NOTE — Telephone Encounter (Signed)
Ok to refill 

## 2017-09-23 ENCOUNTER — Encounter: Payer: Self-pay | Admitting: Internal Medicine

## 2017-09-23 DIAGNOSIS — K641 Second degree hemorrhoids: Secondary | ICD-10-CM

## 2017-09-23 HISTORY — DX: Second degree hemorrhoids: K64.1

## 2017-09-23 NOTE — Progress Notes (Signed)
   HEMORRHOID LIGATION:  SXS: prolapse, bleeding, anal irritation and itching, fecal seepage  Colonoscopy 2016 Dr. Ardis Hughs + hemorrhoids  Female [facility]MA staff present  Rectal:NL anoderm, nontender and no mass  Anoscopy - Gr 2 internal hemorrhoids all positions  PROCEDURE NOTE: The patient presents with symptomatic grade 2 hemorrhoids, requesting rubber band ligation of his/her hemorrhoidal disease.  All risks, benefits and alternative forms of therapy were described and informed consent was obtained.   The anorectum was pre-medicated with 0.125% NTG and 5% lidocaine The decision was made to band the right anterior internal hemorrhoid, and the [place] was used to perform band ligation without complication.  Digital anorectal examination was then performed to assure proper positioning of the band, and to adjust the banded tissue as required.  The patient was discharged home without pain or other issues.  Dietary and behavioral recommendations were given and along with follow-up instructions.     Benefiber 1 tbsp qd  The patient will return in mid-June for  follow-up and possible additional banding as required. No complications were encountered and the patient tolerated the procedure well.   I appreciate the opportunity to care for this patient.  [id number], Iona Beard, MD Dr. Oretha Caprice

## 2017-09-23 NOTE — Assessment & Plan Note (Signed)
RA banded RTC 2-3 weeks more banding

## 2017-09-27 MED FILL — LEVOCETIRIZINE 5 MG TABLET: 5 | 90 days supply | Qty: 90 | Fill #1

## 2017-09-27 MED FILL — PANTOPRAZOLE SOD DR 40 MG T: 40 | 90 days supply | Qty: 90 | Fill #1

## 2017-09-27 MED FILL — AMLODIPINE BESYLATE 10 MG T: 10 | 30 days supply | Qty: 30 | Fill #1

## 2017-09-27 MED FILL — FUROSEMIDE 40 MG TAB: 40 | 90 days supply | Qty: 90 | Fill #1

## 2017-10-03 ENCOUNTER — Other Ambulatory Visit: Payer: Self-pay | Admitting: Internal Medicine

## 2017-10-03 MED FILL — LORazepam 1 MG TABS: 1 | 30 days supply | Qty: 30 | Fill #0

## 2017-10-03 MED FILL — CYCLOBENZAPRINE 5 MG TABLET: 5 | 30 days supply | Qty: 90 | Fill #1

## 2017-10-03 NOTE — Telephone Encounter (Signed)
CY Please advise if okay to refill. Thanks.

## 2017-10-03 NOTE — Telephone Encounter (Signed)
Ok to refill total 6 months 

## 2017-10-07 DIAGNOSIS — G4733 Obstructive sleep apnea (adult) (pediatric): Secondary | ICD-10-CM | POA: Diagnosis not present

## 2017-10-08 ENCOUNTER — Other Ambulatory Visit: Payer: Self-pay | Admitting: *Deleted

## 2017-10-08 DIAGNOSIS — G4733 Obstructive sleep apnea (adult) (pediatric): Secondary | ICD-10-CM | POA: Diagnosis not present

## 2017-10-11 ENCOUNTER — Encounter: Payer: Self-pay | Admitting: Internal Medicine

## 2017-10-11 ENCOUNTER — Ambulatory Visit (INDEPENDENT_AMBULATORY_CARE_PROVIDER_SITE_OTHER): Payer: 59 | Admitting: Internal Medicine

## 2017-10-11 DIAGNOSIS — K641 Second degree hemorrhoids: Secondary | ICD-10-CM

## 2017-10-11 NOTE — Assessment & Plan Note (Signed)
RP LL banded RTC prn

## 2017-10-11 NOTE — Patient Instructions (Signed)
  HEMORRHOID BANDING PROCEDURE    FOLLOW-UP CARE   1. The procedure you have had should have been relatively painless since the banding of the area involved does not have nerve endings and there is no pain sensation.  The rubber band cuts off the blood supply to the hemorrhoid and the band may fall off as soon as 48 hours after the banding (the band may occasionally be seen in the toilet bowl following a bowel movement). You may notice a temporary feeling of fullness in the rectum which should respond adequately to plain Tylenol or Motrin.  2. Following the banding, avoid strenuous exercise that evening and resume full activity the next day.  A sitz bath (soaking in a warm tub) or bidet is soothing, and can be useful for cleansing the area after bowel movements.     3. To avoid constipation, take two tablespoons of natural wheat bran, natural oat bran, flax, Benefiber or any over the counter fiber supplement and increase your water intake to 7-8 glasses daily.    4. Unless you have been prescribed anorectal medication, do not put anything inside your rectum for two weeks: No suppositories, enemas, fingers, etc.  5. Occasionally, you may have more bleeding than usual after the banding procedure.  This is often from the untreated hemorrhoids rather than the treated one.  Don't be concerned if there is a tablespoon or so of blood.  If there is more blood than this, lie flat with your bottom higher than your head and apply an ice pack to the area. If the bleeding does not stop within a half an hour or if you feel faint, call our office at [phone] or go to the emergency room.  6. Problems are not common; however, if there is a substantial amount of bleeding, severe pain, chills, fever or difficulty passing urine (very rare) or other problems, you should call us at [phone] or report to the nearest emergency room.  7. Do not stay seated continuously for more than 2-3 hours for a day or  two after the procedure.  Tighten your buttock muscles 10-15 times every two hours and take 10-15 deep breaths every 1-2 hours.  Do not spend more than a few minutes on the toilet if you cannot empty your bowel; instead re-visit the toilet at a later time.    Follow up as needed with Dr Carlean Purl.    I appreciate the opportunity to care for you. Silvano Rusk, MD, [facility]

## 2017-10-11 NOTE — Progress Notes (Signed)
         HEMORRHOID LIGATION  SXS: Prolapse bleeding anal irritation and itching and fecal seepage  LAST COLONOSCOPY: 2016 hemorrhoids  Female staff present RECTAL: Small anal tags no mass nontender   PROCEDURE NOTE: The patient presents with symptomatic grade 2 hemorrhoids, requesting rubber band ligation of his/her hemorrhoidal disease.  All risks, benefits and alternative forms of therapy were described and informed consent was obtained.   The anorectum was pre-medicated with 0.125% NTG and 5% Lidocaine topical The decision was made to band the right posterior and left lateral internal hemorrhoids, and the [place] was used to perform band ligation without complication.  Digital anorectal examination was then performed to assure proper positioning of the band, and to adjust the banded tissue as required.  The patient was discharged home without pain or other issues.  Dietary and behavioral recommendations were given and along with follow-up instructions.     The patient will return  as needed for  follow-up and possible additional banding as required. No complications were encountered and the patient tolerated the procedure well.   I appreciate the opportunity to care for this patient.  [id number], Iona Beard, MD

## 2017-11-07 ENCOUNTER — Encounter: Payer: Self-pay | Admitting: Internal Medicine

## 2017-11-07 ENCOUNTER — Ambulatory Visit (INDEPENDENT_AMBULATORY_CARE_PROVIDER_SITE_OTHER): Payer: 59 | Admitting: Internal Medicine

## 2017-11-07 VITALS — BP 138/88 | HR 70 | Ht 63.0 in | Wt 237.0 lb

## 2017-11-07 DIAGNOSIS — J4520 Mild intermittent asthma, uncomplicated: Secondary | ICD-10-CM | POA: Diagnosis not present

## 2017-11-07 DIAGNOSIS — G4733 Obstructive sleep apnea (adult) (pediatric): Secondary | ICD-10-CM | POA: Diagnosis not present

## 2017-11-07 MED ORDER — VENTOLIN HFA 108 (90 BASE) MCG/ACT IN AERS: INHALATION_SPRAY | RESPIRATORY_TRACT | 12 refills | Status: DC

## 2017-11-07 MED ORDER — ALBUTEROL SULFATE (2.5 MG/3ML) 0.083% IN NEBU: INHALATION_SOLUTION | RESPIRATORY_TRACT | 12 refills | Status: DC

## 2017-11-07 MED ORDER — EPINEPHRINE 0.3 MG/0.3ML IJ SOAJ: INTRAMUSCULAR | 99 refills | Status: DC

## 2017-11-07 MED ORDER — LORAZEPAM 1 MG PO TABS: ORAL_TABLET | ORAL | 3 refills | Status: DC

## 2017-11-07 MED ORDER — AZELASTINE-FLUTICASONE 137-50 MCG/ACT NA SUSP: NASAL | 5 refills | Status: DC

## 2017-11-07 MED ORDER — HYDROCOD POLST-CPM POLST ER 10-8 MG/5ML PO SUER: 5.0000 mL | Freq: Three times a day (TID) | ORAL | 0 refills | Status: DC | PRN

## 2017-11-07 MED ORDER — BUDESONIDE-FORMOTEROL FUMARATE 160-4.5 MCG/ACT IN AERO: 2.0000 | INHALATION_SPRAY | Freq: Two times a day (BID) | RESPIRATORY_TRACT | 12 refills | Status: DC

## 2017-11-07 MED FILL — ALBUTEROL 0.083% INHAL SOLN: (2.5 MG/3ML | 12 days supply | Qty: 90 | Fill #0

## 2017-11-07 MED FILL — VENTOLIN HFA 90 MCG INHALER: 108 (90 BAS | 25 days supply | Qty: 18 | Fill #0

## 2017-11-07 MED FILL — LORazepam 1 MG TABS: 1 | 10 days supply | Qty: 30 | Fill #0

## 2017-11-07 MED FILL — EPINEPHRINE 0.3 MG AUTO-INJ: 0.3 | 2 days supply | Qty: 2 | Fill #0

## 2017-11-07 MED FILL — SYMBICORT 160-4.5 MCG INH: 160-4.5 | 30 days supply | Qty: 10 | Fill #0

## 2017-11-07 NOTE — Patient Instructions (Addendum)
Script refills done  order- referral to Dr Oneal Grout, orthodontist      Consider oral appliance for OSA Please call as needed

## 2017-11-07 NOTE — Progress Notes (Signed)
Subjective:    Patient ID: [name], female    DOB: 06/28/1965, 52 y.o.   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 FVC 2.39/87%, FEV1 2.04/89%, FEV1/FVC 2.04, FEF 25-75 percent 2.59/87%. Unattended Home Sleep Test 07/15/16-AHI 13.9/hour, desaturation to 74%, body weight 240 pounds Resp Allergy Profile 06/29/16- Pos dog and timothy grass, IgE 41 HST 10/07/17-AHI 6.9/hour, desaturation to 81%, body weight 249 pounds -----------------------------------------------------------------------.  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  She blames allergies for sniffles despite taking Claritin in the morning and xyzal at night. Says she feels better if she uses CPAP falls asleep TV first. History of adverse reaction and does not get flu shot No recent asthma and no need for rescue inhaler or nocturnal awakening. She is looking forward to a family [place] saying she breathes better there.  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 Breathing has been okay having some problems with the heat, she also needs medication.   Weight today 237 lbs Says that work stress is a trigger for her asthma symptoms.  Denies heartburn/reflux.  Blames  Protonix for hair loss and changing to Tagamet. Using rescue inhaler 0-2 times daily  ROS-see HPI    + = positive Constitutional:   +  weight loss, night sweats, fevers, chills, +fatigue, lassitude. HEENT:   No-  headaches, difficulty swallowing, tooth/dental problems, sore throat,       No-  sneezing, itching, ear ache, nasal congestion, post nasal drip,  CV:  No-   chest pain, orthopnea, PND, swelling in lower extremities, anasarca,                                                   dizziness, palpitations Resp: + shortness of breath with exertion or at rest.                 productive cough, non-productive cough,  No- coughing up of blood.                  change in color of mucus.  wheezing.   Skin: No-   rash or lesions. GI:  No-   heartburn, indigestion, abdominal pain, nausea, vomiting,  GU:  MS:  No-   joint pain or swelling.   Neuro-     nothing unusual Psych:  No- change in mood or affect. No depression or anxiety.  No memory loss.  OBJ- Physical Exam General- Alert, Oriented, Affect-appropriate, Distress- none acute, + morbidly obese Skin- rash-none, lesions- none, excoriation- none Lymphadenopathy- none Head- atraumatic            Eyes-  Gross vision intact, PERRLA, conjunctivae and secretions clear            Ears- Hearing, canals-normal            Nose- sniffing-none, no-Septal dev, mucus, polyps, erosion, perforation             Throat- Mallampati III , mucosa clear , drainage- none, tonsils- atrophic Neck- flexible , trachea midline, no stridor , thyroid nl, carotid no bruit Chest - symmetrical excursion , unlabored           Heart/CV- RRR , no murmur , no gallop  , no rub, nl s1 s2                           - JVD- none , edema- none, stasis changes- none, varices- none           Lung-   clear, wheeze -none, cough-none , dullness-none, rub- none           Chest wall-  Abd-  Br/ Gen/ Rectal- Not done, not indicated Extrem- cyanosis- none, clubbing, none, atrophy- none,  strength- nl, cane Neuro- grossly intact to observation    Assessment & Plan:

## 2017-11-14 MED FILL — DYMISTA NASAL SPRAY: 137-50 | 30 days supply | Qty: 23 | Fill #0

## 2017-11-14 MED FILL — HYDROCODONE-CHLORPHEN ER SU: 10-8 | 9 days supply | Qty: 140 | Fill #0

## 2017-11-22 NOTE — Assessment & Plan Note (Signed)
Currently near her baseline with only occasional need for rescue inhaler.  I reinforced educational efforts directed at environmental allergen avoidance and reflux control.  Refill meds.

## 2017-11-22 NOTE — Assessment & Plan Note (Signed)
Her weight fluctuates but she remains quite obese.  Continued efforts towards meaningful weight loss is strongly encouraged.

## 2017-11-22 NOTE — Assessment & Plan Note (Signed)
Education done again trying to boost her motivation and compliance with therapies.  She may do better with an oral appliance and is willing to be referred to learn about this option. Plan-referral to Dr. Ron Parker to consider oral appliance for OSA

## 2017-11-25 ENCOUNTER — Other Ambulatory Visit: Payer: Self-pay | Admitting: Internal Medicine

## 2017-11-25 MED FILL — traMADol HCL 50 MG TABS: 50 | 5 days supply | Qty: 30 | Fill #0

## 2017-11-25 NOTE — Telephone Encounter (Signed)
Ok to refill total 6 months 

## 2017-11-25 NOTE — Telephone Encounter (Signed)
CY Please advise on refill. Thanks.  

## 2018-01-01 ENCOUNTER — Telehealth: Payer: Self-pay | Admitting: Internal Medicine

## 2018-01-01 MED ORDER — PREDNISONE 10 MG PO TABS: ORAL_TABLET | ORAL | 0 refills | Status: DC

## 2018-01-01 MED FILL — predniSONE 10 MG TABS: 10 | 8 days supply | Qty: 20 | Fill #0

## 2018-01-01 NOTE — Telephone Encounter (Signed)
Spoke with patient-states she is having low grade temps with wheezing. Used neb meds every 3 hours through out the night and has relieved some of the chest tightness; however patient still not feeling back to baseline. Pt would like to have prednisone taper called to [facility].   If no better after taper then patient will call office for acute visit.    CY please advise. Thanks.

## 2018-01-01 NOTE — Telephone Encounter (Signed)
Ok prednisone 10 mg, # 20, 4 X 2 DAYS, 3 X 2 DAYS, 2 X 2 DAYS, 1 X 2 DAYS  

## 2018-01-21 DIAGNOSIS — H5203 Hypermetropia, bilateral: Secondary | ICD-10-CM | POA: Diagnosis not present

## 2018-02-05 DIAGNOSIS — I10 Essential (primary) hypertension: Secondary | ICD-10-CM | POA: Diagnosis not present

## 2018-02-05 DIAGNOSIS — K219 Gastro-esophageal reflux disease without esophagitis: Secondary | ICD-10-CM | POA: Diagnosis not present

## 2018-02-05 DIAGNOSIS — R05 Cough: Secondary | ICD-10-CM | POA: Diagnosis not present

## 2018-02-05 DIAGNOSIS — G4733 Obstructive sleep apnea (adult) (pediatric): Secondary | ICD-10-CM | POA: Diagnosis not present

## 2018-02-05 DIAGNOSIS — J4540 Moderate persistent asthma, uncomplicated: Secondary | ICD-10-CM | POA: Diagnosis not present

## 2018-02-05 DIAGNOSIS — E785 Hyperlipidemia, unspecified: Secondary | ICD-10-CM | POA: Diagnosis not present

## 2018-02-05 DIAGNOSIS — J302 Other seasonal allergic rhinitis: Secondary | ICD-10-CM | POA: Diagnosis not present

## 2018-02-05 DIAGNOSIS — R7303 Prediabetes: Secondary | ICD-10-CM | POA: Diagnosis not present

## 2018-02-05 MED FILL — predniSONE 20 MG TABS: 20 | 7 days supply | Qty: 7 | Fill #0

## 2018-02-05 MED FILL — IBUPROFEN 800 MG TAB: 800 | 30 days supply | Qty: 90 | Fill #0

## 2018-02-05 MED FILL — BENZONATATE 100 MG CAP: 100 | 5 days supply | Qty: 15 | Fill #0

## 2018-02-05 MED FILL — CYCLOBENZAPRINE HCL 5 MG TA: 5 | 30 days supply | Qty: 90 | Fill #0

## 2018-02-05 MED FILL — AZITHROMYCIN 250 MG TABLET: 250 | 5 days supply | Qty: 6 | Fill #0

## 2018-04-23 MED FILL — traMADol HCL 50 MG TABS: 50 | 5 days supply | Qty: 30 | Fill #1

## 2018-05-06 DIAGNOSIS — N9089 Other specified noninflammatory disorders of vulva and perineum: Secondary | ICD-10-CM | POA: Diagnosis not present

## 2018-05-06 DIAGNOSIS — N898 Other specified noninflammatory disorders of vagina: Secondary | ICD-10-CM | POA: Diagnosis not present

## 2018-05-06 MED FILL — FLUCONAZOLE 150 MG TABS: 150 | 3 days supply | Qty: 3 | Fill #0

## 2018-05-06 MED FILL — TERCONAZOLE 0.4% VAG CREAM: 0.4 | 7 days supply | Qty: 45 | Fill #0

## 2018-05-06 MED FILL — metroNIDAZOLE 0.75 % GEL: 0.75 | 5 days supply | Qty: 70 | Fill #0

## 2018-05-09 DIAGNOSIS — L309 Dermatitis, unspecified: Secondary | ICD-10-CM | POA: Diagnosis not present

## 2018-05-09 DIAGNOSIS — L239 Allergic contact dermatitis, unspecified cause: Secondary | ICD-10-CM | POA: Diagnosis not present

## 2018-05-09 DIAGNOSIS — L308 Other specified dermatitis: Secondary | ICD-10-CM | POA: Diagnosis not present

## 2018-05-09 DIAGNOSIS — B353 Tinea pedis: Secondary | ICD-10-CM | POA: Diagnosis not present

## 2018-05-09 MED FILL — KETOCONAZOLE 2% CREAM: 2 | 28 days supply | Qty: 60 | Fill #0

## 2018-05-09 MED FILL — TRIAMCINOLONE 0.1% CREAM: 0.1 | 20 days supply | Qty: 45 | Fill #0

## 2018-05-22 MED FILL — IBUPROFEN 800 MG TAB: 800 | 30 days supply | Qty: 90 | Fill #1

## 2018-05-22 MED FILL — traMADol HCL 50 MG TABS: 50 | 5 days supply | Qty: 30 | Fill #2

## 2018-05-22 MED FILL — CYCLOBENZAPRINE HCL 5 MG TA: 5 | 30 days supply | Qty: 90 | Fill #1

## 2018-05-22 MED FILL — EPINEPHRINE 0.3 MG AUTO-INJ: 0.3 | 2 days supply | Qty: 2 | Fill #1

## 2018-05-22 MED FILL — AMLODIPINE BESYLATE 10 MG T: 10 | 30 days supply | Qty: 30 | Fill #2

## 2018-06-18 ENCOUNTER — Ambulatory Visit (INDEPENDENT_AMBULATORY_CARE_PROVIDER_SITE_OTHER): Payer: 59 | Admitting: Nurse Practitioner

## 2018-06-18 ENCOUNTER — Encounter: Payer: Self-pay | Admitting: Nurse Practitioner

## 2018-06-18 VITALS — BP 132/68 | HR 75 | Temp 98.7°F | Ht 63.0 in | Wt 237.0 lb

## 2018-06-18 DIAGNOSIS — J4541 Moderate persistent asthma with (acute) exacerbation: Secondary | ICD-10-CM

## 2018-06-18 DIAGNOSIS — J45909 Unspecified asthma, uncomplicated: Secondary | ICD-10-CM

## 2018-06-18 MED ORDER — PREDNISONE 10 MG PO TABS: ORAL_TABLET | ORAL | 0 refills | Status: DC

## 2018-06-18 MED ORDER — METHYLPREDNISOLONE ACETATE 80 MG/ML IJ SUSP
120.0000 mg | Freq: Once | INTRAMUSCULAR | Status: AC
  Administered 2018-06-18: 120 mg via INTRAMUSCULAR

## 2018-06-18 MED ORDER — BUDESONIDE-FORMOTEROL FUMARATE 160-4.5 MCG/ACT IN AERO: 2.0000 | INHALATION_SPRAY | Freq: Two times a day (BID) | RESPIRATORY_TRACT | 6 refills | Status: DC

## 2018-06-18 MED ORDER — LEVALBUTEROL HCL 0.63 MG/3ML IN NEBU
0.6300 mg | INHALATION_SOLUTION | Freq: Once | RESPIRATORY_TRACT | Status: AC
  Administered 2018-06-18: 0.63 mg via RESPIRATORY_TRACT

## 2018-06-18 MED FILL — predniSONE 10 MG TABS: 10 | 8 days supply | Qty: 20 | Fill #0

## 2018-06-18 NOTE — Patient Instructions (Signed)
Depomedrol 120 mg in office Xopenex neb treatment in office Will order prednisone taper to start tomorrow Please restart Symbicort daily Continue Proventil as needed every 6 hours Follow up in 1 week or sooner if needed Please go to the ED if symptoms worsen

## 2018-06-18 NOTE — Progress Notes (Signed)
@Patient  ID: [name], female    DOB: Mar 28, 1966, 53 y.o.   MRN: [id number]  Chief Complaint  Patient presents with  . Acute Visit    asthma flare - had to use neb at work    Referring provider: Benito Mccreedy, MD  HPI  53 year old female never smoker, followed for moderate persistent asthma, allergic rhinitis, OSA by Dr. Annamaria Boots. PMH includes GERD, HTN, pseudotumor cerebri, migraine.  Failed Xolair in the past.  Tests: Office Spirometry 07/02/2014-within normal limits. FVC 2.39/87%, FEV1 2.04/89%, FEV1/FVC 2.04, FEF 25-75 percent 2.59/87%. Unattended Home Sleep Test 07/15/16-AHI 13.9/hour, desaturation to 74%, body weight 240 pounds Resp Allergy Profile 06/29/16- Pos dog and timothy grass, IgE 41 HST 10/07/17-AHI 6.9/hour, desaturation to 81%, body weight 249 pounds   OV 06/18/18 - Acute shortness of breath and wheezing Patient presents today with acute shortness of breath and wheezing.  She has a history of asthma.  She states that this feels like a typical asthma exacerbation for her.  She has used her home albuterol treatment last night and this morning.  She admits that she has not been taking her Symbicort in the past several weeks because she was doing well.  She states that for the past couple days with the change in weather it has really aggravated her asthma.  She denies any significant cough, chest congestion, or sinus congestion.  Is afebrile in the office today.  Her O2 sats are 99% on room air.     Allergies  Allergen Reactions  . Influenza A (H1n1) Monoval Vac     Reaction: systemic reaction  . Omalizumab     Arvid Right* REACTION: angioedema-looses airway  . Promethazine Hcl     REACTION: hallucinations    Immunization History  Administered Date(s) Administered  . Pneumococcal Polysaccharide-23 05/24/2009    Past Medical History:  Diagnosis Date  . Allergic rhinitis   . Asthma    h/o intubation 2001  . Dysphagia    Dr. Ardis Hughs.  egd w/ dilatation  06/08/2007  . Esophageal dilatation   . GERD (gastroesophageal reflux disease)   . Headache    hx migraines  . Hypertension in pregnancy    pregnancy induced htn  . Meniscal injury   . Morbid obesity with body mass index of 45.0-49.9 in adult [facility])   . Prolapsed internal hemorrhoids, grade 2 09/23/2017  . Pseudotumor cerebri    has required LP for release of pressure  . Steroid-induced hyperglycemia 05/08/2013    Tobacco History: Social History   Tobacco Use  Smoking Status Never Smoker  Smokeless Tobacco Never Used   Counseling given: Not Answered   Outpatient Encounter Medications as of 06/18/2018  Medication Sig  . albuterol (PROVENTIL) (2.5 MG/3ML) 0.083% nebulizer solution USE 1 VIAL VIA NEBULIZER EVERY 6 HOURS AS NEEDED FOR WHEEZING OR SHORTNESS OF BREATH  . Azelastine-Fluticasone (DYMISTA) 137-50 MCG/ACT SUSP One puff twice daily ea nostril  . budesonide-formoterol (SYMBICORT) 160-4.5 MCG/ACT inhaler Inhale 2 puffs into the lungs 2 (two) times daily. Rinse mouth  . chlorpheniramine-HYDROcodone (TUSSIONEX) 10-8 MG/5ML SUER Take 5 mLs by mouth every 8 (eight) hours as needed for cough.  . cyclobenzaprine (FLEXERIL) 5 MG tablet Take 1 tablet (5 mg total) by mouth 3 (three) times daily as needed for muscle spasms (or migraines).  Marland Kitchen econazole nitrate 1 % cream As needed  . EPINEPHRINE 0.3 mg/0.3 mL IJ SOAJ injection INJECT INTO THIGH FOR SEVERE ASTHMA/ ALLERGIC REACTION  . furosemide (LASIX) 40 MG tablet TAKE 1  TABLET BY MOUTH ONCE DAILY AS NEEDED  . ibuprofen (ADVIL,MOTRIN) 800 MG tablet Take 1 tablet by mouth daily as needed.  Marland Kitchen levocetirizine (XYZAL) 5 MG tablet TAKE 1 TABLET BY MOUTH EVERY EVENING  . loratadine (CLARITIN) 10 MG tablet Take 1 tablet (10 mg total) by mouth daily.  Marland Kitchen LORazepam (ATIVAN) 1 MG tablet 1 every 8 hours if needed for anxiety  . olopatadine (PATANOL) 0.1 % ophthalmic solution INSTILL 1 DROP INTO BOTH EYES TWO TIMES DAILY AS NEEDED FOR ALLERGIES  .  Spacer/Aero-Holding Chambers (AEROCHAMBER MV) inhaler Use as instructed  . traMADol (ULTRAM) 50 MG tablet TAKE 1 TABLET BY MOUTH EVERY 4 HOURS AS NEEDED FOR PAIN  . TRANSDERM-SCOP, 1.5 MG, 1 MG/3DAYS APPLY 1 PATCH ONTO THE SKIN EVERY 3 DAYS.  Marland Kitchen urea (CARMOL) 40 % CREA As needed  . VENTOLIN HFA 108 (90 Base) MCG/ACT inhaler INHALE 2 PUFFS BY MOUTH INTO THE LUNGS EVERY 6 HOURS AS NEEDED FOR WHEEZING  . [DISCONTINUED] pantoprazole (PROTONIX) 40 MG tablet Twice daily before meal (Patient taking differently: Take 40 mg by mouth 2 (two) times daily. )  . [DISCONTINUED] predniSONE (DELTASONE) 10 MG tablet 4 X 2 DAYS, 3 X 2 DAYS, 2 X 2 DAYS, 1 X 2 DAYS then STOP  . budesonide-formoterol (SYMBICORT) 160-4.5 MCG/ACT inhaler Inhale 2 puffs into the lungs 2 (two) times daily.  . predniSONE (DELTASONE) 10 MG tablet Take 4 tabs for 2 days, then 3 tabs for 2 days, then 2 tabs for 2 days, then 1 tab for 2 days, then stop   Facility-Administered Encounter Medications as of 06/18/2018  Medication  . [COMPLETED] levalbuterol (XOPENEX) nebulizer solution 0.63 mg  . [COMPLETED] methylPREDNISolone acetate (DEPO-MEDROL) injection 120 mg     Review of Systems  Review of Systems  Constitutional: Negative.  Negative for chills and fever.  HENT: Negative.   Respiratory: Positive for shortness of breath and wheezing. Negative for cough.   Cardiovascular: Negative.  Negative for chest pain, palpitations and leg swelling.  Gastrointestinal: Negative.   Allergic/Immunologic: Negative.   Neurological: Negative.   Psychiatric/Behavioral: Negative.        Physical Exam  BP 132/68 (BP Location: Right Arm, Patient Position: Sitting, Cuff Size: Normal)   Pulse 75   Temp 98.7 F (37.1 C)   Ht 5\' 3"  (1.6 m)   Wt 237 lb (107.5 kg)   SpO2 99%   BMI 41.98 kg/m   Wt Readings from Last 5 Encounters:  06/18/18 237 lb (107.5 kg)  11/07/17 237 lb (107.5 kg)  12/20/16 249 lb (112.9 kg)  09/19/16 271 lb 9.6 oz (123.2  kg)  05/02/16 240 lb (108.9 kg)     Physical Exam Vitals signs and nursing note reviewed.  Constitutional:      Appearance: She is well-developed. She is not ill-appearing or diaphoretic.  Cardiovascular:     Rate and Rhythm: Normal rate and regular rhythm.  Pulmonary:     Effort: Pulmonary effort is normal.     Breath sounds: Wheezing present.  Musculoskeletal:        General: No swelling.  Neurological:     Mental Status: She is alert and oriented to person, place, and time.       Assessment & Plan:   Moderate persistent asthma with exacerbation Patient presents today with a asthma flare.  Symptoms started yesterday.  She states that she has been doing well and admitted that she stopped taking her Symbicort daily.  Xopenex and Depo-Medrol given in  office today.  Patient stated that this did provide relief.  Will order prednisone taper start tomorrow.  Please restart Symbicort daily.  Patient Instructions  Depomedrol 120 mg in office Xopenex neb treatment in office Will order prednisone taper to start tomorrow Please restart Symbicort daily Continue Proventil as needed every 6 hours Follow up in 1 week or sooner if needed Please go to the ED if symptoms worsen       Fenton Foy, NP 06/18/2018

## 2018-06-18 NOTE — Assessment & Plan Note (Signed)
Patient presents today with a asthma flare.  Symptoms started yesterday.  She states that she has been doing well and admitted that she stopped taking her Symbicort daily.  Xopenex and Depo-Medrol given in office today.  Patient stated that this did provide relief.  Will order prednisone taper start tomorrow.  Please restart Symbicort daily.  Patient Instructions  Depomedrol 120 mg in office Xopenex neb treatment in office Will order prednisone taper to start tomorrow Please restart Symbicort daily Continue Proventil as needed every 6 hours Follow up in 1 week or sooner if needed Please go to the ED if symptoms worsen

## 2018-06-20 MED FILL — CYCLOBENZAPRINE HCL 5 MG TA: 5 | 30 days supply | Qty: 90 | Fill #0

## 2018-06-20 MED FILL — AMLODIPINE BESYLATE 10 MG T: 10 | 30 days supply | Qty: 30 | Fill #3

## 2018-06-20 MED FILL — IBUPROFEN 800 MG TAB: 800 | 30 days supply | Qty: 90 | Fill #0

## 2018-06-25 ENCOUNTER — Ambulatory Visit (INDEPENDENT_AMBULATORY_CARE_PROVIDER_SITE_OTHER): Payer: 59 | Admitting: Nurse Practitioner

## 2018-06-25 ENCOUNTER — Encounter: Payer: Self-pay | Admitting: Nurse Practitioner

## 2018-06-25 DIAGNOSIS — J4541 Moderate persistent asthma with (acute) exacerbation: Secondary | ICD-10-CM | POA: Diagnosis not present

## 2018-06-25 MED ORDER — LEVOCETIRIZINE DIHYDROCHLORIDE 5 MG PO TABS: 5.0000 mg | ORAL_TABLET | Freq: Every evening | ORAL | 5 refills | Status: DC

## 2018-06-25 MED ORDER — MONTELUKAST SODIUM 10 MG PO TABS: 10.0000 mg | ORAL_TABLET | Freq: Every day | ORAL | 11 refills | Status: DC

## 2018-06-25 MED ORDER — HYDROCOD POLST-CPM POLST ER 10-8 MG/5ML PO SUER: 5.0000 mL | Freq: Two times a day (BID) | ORAL | 0 refills | Status: DC | PRN

## 2018-06-25 MED ORDER — HYDROCOD POLST-CPM POLST ER 10-8 MG/5ML PO SUER: 5.0000 mL | Freq: Two times a day (BID) | ORAL | 0 refills | Status: AC | PRN

## 2018-06-25 MED FILL — HYDROCODONE-CHLORPHEN ER SU: 10-8 | 7 days supply | Qty: 70 | Fill #0

## 2018-06-25 MED FILL — MONTELUKAST SOD 10 MG TAB: 10 | 30 days supply | Qty: 30 | Fill #0

## 2018-06-25 MED FILL — LEVOCETIRIZINE 5 MG TABLET: 5 | 30 days supply | Qty: 30 | Fill #0

## 2018-06-25 NOTE — Progress Notes (Signed)
@Patient  ID: [name], female    DOB: Dec 15, 1965, 53 y.o.   MRN: [id number]  Chief Complaint  Patient presents with  . Follow-up    Uncomplicated asthma    Referring provider: Benito Mccreedy, MD  HPI 53 year old female never smoker, followed for moderate persistent asthma, allergic rhinitis, OSA by Dr. Annamaria Boots. PMH includes GERD, HTN, pseudotumor cerebri, migraine.  Failed Xolair in the past.  Tests: Office Spirometry 07/02/2014-within normal limits. FVC 2.39/87%, FEV1 2.04/89%, FEV1/FVC 2.04, FEF 25-75 percent 2.59/87%. Unattended Home Sleep Test 07/15/16-AHI 13.9/hour, desaturation to 74%, body weight 240 pounds Resp Allergy Profile 06/29/16- Pos dog and timothy grass, IgE 41 HST6/17/19-AHI 6.9/hour, desaturation to 81%, body weight 249 pounds    OV 06/25/18 - Follow up Patient presents for a follow-up for recent asthma exacerbation.  She was last seen by me on 06/18/2018.  She was treated with Depo-Medrol, Xopenex neb treatment, and ordered a prednisone taper.  She was advised to restart her Symbicort daily and to continue Proventil as needed.  She states that symptoms are much improved now.  She states that she has been compliant with her Symbicort daily.  She is still finishing her prednisone taper.  Patient still has some cough lingering.  She would like a refill on Tussionex.  Denies any recent fever.  Denies any shortness of breath, chest pain, or edema.   Allergies  Allergen Reactions  . Influenza A (H1n1) Monoval Vac     Reaction: systemic reaction  . Omalizumab     Arvid Right* REACTION: angioedema-looses airway  . Promethazine Hcl     REACTION: hallucinations    Immunization History  Administered Date(s) Administered  . Pneumococcal Polysaccharide-23 05/24/2009    Past Medical History:  Diagnosis Date  . Allergic rhinitis   . Asthma    h/o intubation 2001  . Dysphagia    Dr. Ardis Hughs.  egd w/ dilatation 06/08/2007  . Esophageal dilatation   . GERD  (gastroesophageal reflux disease)   . Headache    hx migraines  . Hypertension in pregnancy    pregnancy induced htn  . Meniscal injury   . Morbid obesity with body mass index of 45.0-49.9 in adult [facility])   . Prolapsed internal hemorrhoids, grade 2 09/23/2017  . Pseudotumor cerebri    has required LP for release of pressure  . Steroid-induced hyperglycemia 05/08/2013    Tobacco History: Social History   Tobacco Use  Smoking Status Never Smoker  Smokeless Tobacco Never Used   Counseling given: Yes   Outpatient Encounter Medications as of 06/25/2018  Medication Sig  . albuterol (PROVENTIL) (2.5 MG/3ML) 0.083% nebulizer solution USE 1 VIAL VIA NEBULIZER EVERY 6 HOURS AS NEEDED FOR WHEEZING OR SHORTNESS OF BREATH  . Azelastine-Fluticasone (DYMISTA) 137-50 MCG/ACT SUSP One puff twice daily ea nostril  . budesonide-formoterol (SYMBICORT) 160-4.5 MCG/ACT inhaler Inhale 2 puffs into the lungs 2 (two) times daily. Rinse mouth  . budesonide-formoterol (SYMBICORT) 160-4.5 MCG/ACT inhaler Inhale 2 puffs into the lungs 2 (two) times daily.  . chlorpheniramine-HYDROcodone (TUSSIONEX) 10-8 MG/5ML SUER Take 5 mLs by mouth every 12 (twelve) hours as needed for up to 7 days for cough.  . cyclobenzaprine (FLEXERIL) 5 MG tablet Take 1 tablet (5 mg total) by mouth 3 (three) times daily as needed for muscle spasms (or migraines).  Marland Kitchen econazole nitrate 1 % cream As needed  . EPINEPHRINE 0.3 mg/0.3 mL IJ SOAJ injection INJECT INTO THIGH FOR SEVERE ASTHMA/ ALLERGIC REACTION  . furosemide (LASIX) 40 MG tablet TAKE  1 TABLET BY MOUTH ONCE DAILY AS NEEDED  . ibuprofen (ADVIL,MOTRIN) 800 MG tablet Take 1 tablet by mouth daily as needed.  Marland Kitchen levocetirizine (XYZAL) 5 MG tablet Take 1 tablet (5 mg total) by mouth every evening.  . loratadine (CLARITIN) 10 MG tablet Take 1 tablet (10 mg total) by mouth daily.  Marland Kitchen LORazepam (ATIVAN) 1 MG tablet 1 every 8 hours if needed for anxiety  . montelukast (SINGULAIR) 10 MG tablet  Take 1 tablet (10 mg total) by mouth at bedtime.  Marland Kitchen olopatadine (PATANOL) 0.1 % ophthalmic solution INSTILL 1 DROP INTO BOTH EYES TWO TIMES DAILY AS NEEDED FOR ALLERGIES  . predniSONE (DELTASONE) 10 MG tablet Take 4 tabs for 2 days, then 3 tabs for 2 days, then 2 tabs for 2 days, then 1 tab for 2 days, then stop  . Spacer/Aero-Holding Chambers (AEROCHAMBER MV) inhaler Use as instructed  . traMADol (ULTRAM) 50 MG tablet TAKE 1 TABLET BY MOUTH EVERY 4 HOURS AS NEEDED FOR PAIN  . TRANSDERM-SCOP, 1.5 MG, 1 MG/3DAYS APPLY 1 PATCH ONTO THE SKIN EVERY 3 DAYS.  Marland Kitchen urea (CARMOL) 40 % CREA As needed  . VENTOLIN HFA 108 (90 Base) MCG/ACT inhaler INHALE 2 PUFFS BY MOUTH INTO THE LUNGS EVERY 6 HOURS AS NEEDED FOR WHEEZING  . [DISCONTINUED] chlorpheniramine-HYDROcodone (TUSSIONEX) 10-8 MG/5ML SUER Take 5 mLs by mouth every 8 (eight) hours as needed for cough.  . [DISCONTINUED] chlorpheniramine-HYDROcodone (TUSSIONEX) 10-8 MG/5ML SUER Take 5 mLs by mouth every 12 (twelve) hours as needed for up to 7 days for cough.  . [DISCONTINUED] levocetirizine (XYZAL) 5 MG tablet TAKE 1 TABLET BY MOUTH EVERY EVENING   No facility-administered encounter medications on file as of 06/25/2018.      Review of Systems  Review of Systems  Constitutional: Negative.  Negative for chills and fever.  HENT: Negative.   Respiratory: Positive for cough. Negative for shortness of breath and wheezing.   Cardiovascular: Negative.  Negative for chest pain, palpitations and leg swelling.  Gastrointestinal: Negative.   Allergic/Immunologic: Negative.   Neurological: Negative.   Psychiatric/Behavioral: Negative.        Physical Exam  BP (!) 142/90 (BP Location: Right Arm, Patient Position: Sitting, Cuff Size: Large)   Pulse 72   Ht 5\' 3"  (1.6 m)   SpO2 97%   BMI 41.98 kg/m   Wt Readings from Last 5 Encounters:  06/18/18 237 lb (107.5 kg)  11/07/17 237 lb (107.5 kg)  12/20/16 249 lb (112.9 kg)  09/19/16 271 lb 9.6 oz (123.2  kg)  05/02/16 240 lb (108.9 kg)     Physical Exam Vitals signs and nursing note reviewed.  Constitutional:      General: She is not in acute distress.    Appearance: She is well-developed.  Cardiovascular:     Rate and Rhythm: Normal rate and regular rhythm.  Pulmonary:     Effort: Pulmonary effort is normal. No respiratory distress.     Breath sounds: Normal breath sounds. No wheezing or rhonchi.  Musculoskeletal:        General: No swelling.  Neurological:     Mental Status: She is alert and oriented to person, place, and time.        Assessment & Plan:   Moderate persistent asthma with exacerbation Exacerbation is resolving.  States she is doing much better.  She still has some prednisone left before her taper is complete.  She has a close follow-up scheduled with Dr. Annamaria Boots.  Patient Instructions  Please complete prednisone taper Will order Singulair Continue Symbicort daily Continue Proventil as needed every 6 hours  Follow up: Follow up as scheduled with Dr. Annamaria Boots or sooner if needed        Fenton Foy, NP 06/25/2018

## 2018-06-25 NOTE — Assessment & Plan Note (Signed)
Exacerbation is resolving.  States she is doing much better.  She still has some prednisone left before her taper is complete.  She has a close follow-up scheduled with Dr. Annamaria Boots.  Patient Instructions  Please complete prednisone taper Will order Singulair Continue Symbicort daily Continue Proventil as needed every 6 hours  Follow up: Follow up as scheduled with Dr. Annamaria Boots or sooner if needed

## 2018-06-25 NOTE — Patient Instructions (Signed)
Please complete prednisone taper Will order Singulair Continue Symbicort daily Continue Proventil as needed every 6 hours  Follow up: Follow up as scheduled with Dr. Annamaria Boots or sooner if needed

## 2018-07-07 ENCOUNTER — Other Ambulatory Visit: Payer: Self-pay

## 2018-07-07 ENCOUNTER — Encounter: Payer: Self-pay | Admitting: Internal Medicine

## 2018-07-07 ENCOUNTER — Ambulatory Visit (INDEPENDENT_AMBULATORY_CARE_PROVIDER_SITE_OTHER): Payer: 59 | Admitting: Internal Medicine

## 2018-07-07 VITALS — BP 130/88 | HR 84 | Ht 63.0 in

## 2018-07-07 DIAGNOSIS — J4520 Mild intermittent asthma, uncomplicated: Secondary | ICD-10-CM

## 2018-07-07 DIAGNOSIS — J302 Other seasonal allergic rhinitis: Secondary | ICD-10-CM

## 2018-07-07 DIAGNOSIS — K219 Gastro-esophageal reflux disease without esophagitis: Secondary | ICD-10-CM

## 2018-07-07 DIAGNOSIS — J3089 Other allergic rhinitis: Secondary | ICD-10-CM

## 2018-07-07 MED ORDER — AZELASTINE-FLUTICASONE 137-50 MCG/ACT NA SUSP: NASAL | 5 refills | Status: DC

## 2018-07-07 MED ORDER — PREDNISONE 10 MG PO TABS: ORAL_TABLET | ORAL | 0 refills | Status: DC

## 2018-07-07 NOTE — Progress Notes (Signed)
Subjective:    Patient ID: [name], female    DOB: 29-Mar-1966, 53 y.o.   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 FVC 2.39/87%, FEV1 2.04/89%, FEV1/FVC 2.04, FEF 25-75 percent 2.59/87%. Unattended Home Sleep Test 07/15/16-AHI 13.9/hour, desaturation to 74%, body weight 240 pounds Resp Allergy Profile 06/29/16- Pos dog and timothy grass, IgE 41 HST 10/07/17-AHI 6.9/hour, desaturation to 81%, body weight 249 pounds -----------------------------------------------------------------------. 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 Breathing has been okay having some problems with the heat, she also needs medication.   Weight today 237 lbs Says that work stress is a trigger for her asthma symptoms.  Denies heartburn/reflux.  Blames Protonix for hair loss and changing to Tagamet. 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 -----pt states she's had sore throat and seasonal allergies; needs refill on neb solution Ventolin HFA, Singulair, codeine, EpiPen, Tussionex, Symbicort neb albuterol, Reports feeling better than at last visit, but PF down from 400 to 375, seasonal allergies with PNdrip. No fever or cough. Using Xyzal. Asks pred taper to hold. Blames protonix for hair  falling out.  ROS-see HPI    + = positive Constitutional:   +  weight loss, night sweats, fevers, chills, +fatigue, lassitude. HEENT:   No-  headaches, difficulty swallowing, tooth/dental problems, sore throat,       No-  sneezing, itching, ear ache, nasal congestion, +post nasal drip,  CV:  No-   chest pain, orthopnea, PND, swelling in lower extremities, anasarca,                                                   dizziness, palpitations Resp: + shortness of breath with exertion or at rest.                 productive cough, non-productive cough,  No- coughing up of blood.                  change in color of mucus.  wheezing.   Skin: No-   rash or lesions. GI:  No-   heartburn, indigestion, abdominal pain, nausea, vomiting,  GU:  MS:  No-   joint pain or swelling.   Neuro-     nothing unusual Psych:  No- change in mood or affect. No depression or anxiety.  No memory loss.  OBJ- Physical Exam General- Alert, Oriented, Affect-appropriate, Distress- none acute, + morbidly obese Skin- rash-none, lesions- none, excoriation- none Lymphadenopathy- none Head- atraumatic            Eyes- Gross vision intact, PERRLA, conjunctivae and secretions clear            Ears- Hearing, canals-normal  Nose- sniffing-none, no-Septal dev, mucus, polyps, erosion, perforation             Throat- Mallampati III , mucosa clear , drainage- none, tonsils- atrophic Neck- flexible , trachea midline, no stridor , thyroid nl, carotid no bruit Chest - symmetrical excursion , unlabored           Heart/CV- RRR , no murmur , no gallop  , no rub, nl s1 s2                           - JVD- none , edema- none, stasis changes- none, varices- none           Lung-   clear, wheeze -none, cough-none , dullness-none, rub- none           Chest wall-  Abd-  Br/ Gen/ Rectal- Not done, not indicated Extrem- cyanosis- none, clubbing, none, atrophy- none, strength- nl, cane Neuro- grossly intact to observation     Assessment & Plan:

## 2018-07-07 NOTE — Patient Instructions (Signed)
Script printed for prednisone taper to hold in case really needed  Script sent refilling Dymista nasal spray  We suggested you try otc Pepcid before breakfast and supper  Take care, and please call if we can help

## 2018-07-14 NOTE — Assessment & Plan Note (Signed)
Typical seasonal exacerbation for her. Plan refill Dymista, ok to use Xyzal

## 2018-07-14 NOTE — Assessment & Plan Note (Signed)
Infrequent need for rescue inhaler and no recent exacerb. Asks to keep prednisone taper available in case access limited by current viral issues. Plan- Pred taper to hold with discussion re BP, weight, blood sugar

## 2018-07-14 NOTE — Assessment & Plan Note (Signed)
She didn't want to be weighed today. Knows she has gained.

## 2018-07-14 NOTE — Assessment & Plan Note (Signed)
Suggested pepcid as alternative to prilosec, which she blames for hair thinning.

## 2018-07-25 ENCOUNTER — Other Ambulatory Visit: Payer: Self-pay | Admitting: Nurse Practitioner

## 2018-07-25 MED FILL — ALBUTEROL 0.083 MG/ML SOLN: (2.5 MG/3ML | 12 days supply | Qty: 90 | Fill #1

## 2018-07-25 MED FILL — AZELASTINE-FLUTICASONE 137-: 137-50 | 30 days supply | Qty: 23 | Fill #1

## 2018-07-25 MED FILL — HYDROCODONE-CHLORPHEN ER SU: 10-8 | 12 days supply | Qty: 120 | Fill #0

## 2018-07-25 NOTE — Telephone Encounter (Signed)
Tussionex refill e-sent 

## 2018-07-25 NOTE — Telephone Encounter (Signed)
Left message for patient making aware rx sent to [facility].

## 2018-07-25 NOTE — Telephone Encounter (Signed)
Dr. Annamaria Boots, please advise if you are okay refilling med for pt. Thanks!  Allergies  Allergen Reactions  . Influenza A (H1n1) Monoval Vac     Reaction: systemic reaction  . Omalizumab     Arvid Right* REACTION: angioedema-looses airway  . Promethazine Hcl     REACTION: hallucinations     Current Outpatient Medications:  .  albuterol (PROVENTIL) (2.5 MG/3ML) 0.083% nebulizer solution, USE 1 VIAL VIA NEBULIZER EVERY 6 HOURS AS NEEDED FOR WHEEZING OR SHORTNESS OF BREATH, Disp: 90 mL, Rfl: 12 .  Azelastine-Fluticasone (DYMISTA) 137-50 MCG/ACT SUSP, One puff twice daily ea nostril, Disp: 23 g, Rfl: 5 .  budesonide-formoterol (SYMBICORT) 160-4.5 MCG/ACT inhaler, Inhale 2 puffs into the lungs 2 (two) times daily. Rinse mouth, Disp: 1 Inhaler, Rfl: 12 .  budesonide-formoterol (SYMBICORT) 160-4.5 MCG/ACT inhaler, Inhale 2 puffs into the lungs 2 (two) times daily., Disp: 1 Inhaler, Rfl: 6 .  chlorpheniramine-HYDROcodone (TUSSIONEX PENNKINETIC ER) 10-8 MG/5ML SUER, Take 5 mLs by mouth as needed for cough., Disp: , Rfl:  .  cyclobenzaprine (FLEXERIL) 5 MG tablet, Take 1 tablet (5 mg total) by mouth 3 (three) times daily as needed for muscle spasms (or migraines)., Disp: 50 tablet, Rfl: 0 .  econazole nitrate 1 % cream, As needed, Disp: , Rfl: 2 .  EPINEPHRINE 0.3 mg/0.3 mL IJ SOAJ injection, INJECT INTO THIGH FOR SEVERE ASTHMA/ ALLERGIC REACTION, Disp: 1 Device, Rfl: PRN .  furosemide (LASIX) 40 MG tablet, TAKE 1 TABLET BY MOUTH ONCE DAILY AS NEEDED, Disp: 30 tablet, Rfl: 5 .  ibuprofen (ADVIL,MOTRIN) 800 MG tablet, Take 1 tablet by mouth daily as needed., Disp: , Rfl: 0 .  levocetirizine (XYZAL) 5 MG tablet, Take 1 tablet (5 mg total) by mouth every evening., Disp: 30 tablet, Rfl: 5 .  loratadine (CLARITIN) 10 MG tablet, Take 1 tablet (10 mg total) by mouth daily., Disp: , Rfl:  .  LORazepam (ATIVAN) 1 MG tablet, 1 every 8 hours if needed for anxiety, Disp: 30 tablet, Rfl: 3 .  montelukast (SINGULAIR) 10  MG tablet, Take 1 tablet (10 mg total) by mouth at bedtime., Disp: 30 tablet, Rfl: 11 .  olopatadine (PATANOL) 0.1 % ophthalmic solution, INSTILL 1 DROP INTO BOTH EYES TWO TIMES DAILY AS NEEDED FOR ALLERGIES, Disp: 5 mL, Rfl: 5 .  predniSONE (DELTASONE) 10 MG tablet, 4 X 2 DAYS, 3 X 2 DAYS, 2 X 2 DAYS, 1 X 2 DAYS, Disp: 20 tablet, Rfl: 0 .  Spacer/Aero-Holding Chambers (AEROCHAMBER MV) inhaler, Use as instructed, Disp: 1 each, Rfl: 0 .  traMADol (ULTRAM) 50 MG tablet, TAKE 1 TABLET BY MOUTH EVERY 4 HOURS AS NEEDED FOR PAIN, Disp: 30 tablet, Rfl: 5 .  TRANSDERM-SCOP, 1.5 MG, 1 MG/3DAYS, APPLY 1 PATCH ONTO THE SKIN EVERY 3 DAYS., Disp: 10 patch, Rfl: 12 .  urea (CARMOL) 40 % CREA, As needed, Disp: , Rfl: 2 .  VENTOLIN HFA 108 (90 Base) MCG/ACT inhaler, INHALE 2 PUFFS BY MOUTH INTO THE LUNGS EVERY 6 HOURS AS NEEDED FOR WHEEZING, Disp: 18 g, Rfl: 12

## 2018-09-10 MED FILL — ACETAMINOPHEN/COD #3 TABLET: 300-30 | 4 days supply | Qty: 16 | Fill #0

## 2018-09-22 MED FILL — AZELASTINE-FLUTICASONE 137-: 137-50 | 30 days supply | Qty: 23 | Fill #2

## 2018-09-22 MED FILL — CYCLOBENZAPRINE HCL 5 MG TA: 5 | 30 days supply | Qty: 90 | Fill #1

## 2018-09-22 MED FILL — LEVOCETIRIZINE 5 MG TABLET: 5 | 30 days supply | Qty: 30 | Fill #1

## 2018-09-22 MED FILL — IBUPROFEN 800 MG TAB: 800 | 30 days supply | Qty: 90 | Fill #1

## 2018-09-22 MED FILL — MONTELUKAST SOD 10 MG TAB: 10 | 30 days supply | Qty: 30 | Fill #1

## 2018-09-22 MED FILL — ALBUTEROL 0.083 MG/ML SOLN: (2.5 MG/3ML | 12 days supply | Qty: 90 | Fill #2

## 2018-09-25 DIAGNOSIS — Z01419 Encounter for gynecological examination (general) (routine) without abnormal findings: Secondary | ICD-10-CM | POA: Diagnosis not present

## 2018-09-25 DIAGNOSIS — N898 Other specified noninflammatory disorders of vagina: Secondary | ICD-10-CM | POA: Diagnosis not present

## 2018-09-25 DIAGNOSIS — N951 Menopausal and female climacteric states: Secondary | ICD-10-CM | POA: Diagnosis not present

## 2018-09-25 DIAGNOSIS — Z6841 Body Mass Index (BMI) 40.0 and over, adult: Secondary | ICD-10-CM | POA: Diagnosis not present

## 2018-09-25 DIAGNOSIS — Z1231 Encounter for screening mammogram for malignant neoplasm of breast: Secondary | ICD-10-CM | POA: Diagnosis not present

## 2018-10-08 ENCOUNTER — Telehealth: Payer: Self-pay | Admitting: Internal Medicine

## 2018-10-08 NOTE — Telephone Encounter (Signed)
Spoke with patient. She stated that she feels like an asthma exacerbation is coming on. She has been wheezing more and is requesting a steroid injection as this will help to prevent her from going to the hospital.   Appt has been made for tomorrow with [facility] at 1130. COVID screen was negative.   Nothing further needed at time of call.

## 2018-10-09 ENCOUNTER — Encounter: Payer: Self-pay | Admitting: Nurse Practitioner

## 2018-10-09 ENCOUNTER — Other Ambulatory Visit: Payer: Self-pay

## 2018-10-09 ENCOUNTER — Other Ambulatory Visit: Payer: Self-pay | Admitting: Internal Medicine

## 2018-10-09 ENCOUNTER — Ambulatory Visit (INDEPENDENT_AMBULATORY_CARE_PROVIDER_SITE_OTHER): Payer: 59 | Admitting: Nurse Practitioner

## 2018-10-09 VITALS — BP 128/70 | HR 68 | Temp 98.2°F | Ht 63.0 in | Wt 263.0 lb

## 2018-10-09 DIAGNOSIS — J4541 Moderate persistent asthma with (acute) exacerbation: Secondary | ICD-10-CM | POA: Diagnosis not present

## 2018-10-09 DIAGNOSIS — J45901 Unspecified asthma with (acute) exacerbation: Secondary | ICD-10-CM | POA: Diagnosis not present

## 2018-10-09 MED ORDER — BENZONATATE 200 MG PO CAPS: 200.0000 mg | ORAL_CAPSULE | Freq: Three times a day (TID) | ORAL | 1 refills | Status: DC | PRN

## 2018-10-09 MED ORDER — PREDNISONE 10 MG PO TABS: ORAL_TABLET | ORAL | 0 refills | Status: DC

## 2018-10-09 MED ORDER — METHYLPREDNISOLONE ACETATE 80 MG/ML IJ SUSP
120.0000 mg | Freq: Once | INTRAMUSCULAR | Status: AC
  Administered 2018-10-09: 120 mg via INTRAMUSCULAR

## 2018-10-09 MED ORDER — AZITHROMYCIN 250 MG PO TABS: ORAL_TABLET | ORAL | 0 refills | Status: DC

## 2018-10-09 MED FILL — AZITHROMYCIN 250 MG TABS: 250 | 5 days supply | Qty: 6 | Fill #0

## 2018-10-09 MED FILL — predniSONE 10 MG TABS: 10 | 8 days supply | Qty: 20 | Fill #0

## 2018-10-09 MED FILL — BENZONATATE 200 MG CAP: 200 | 10 days supply | Qty: 30 | Fill #0

## 2018-10-09 MED FILL — HYDROCODONE-CHLORPHEN ER SU: 10-8 | 12 days supply | Qty: 118 | Fill #0

## 2018-10-09 NOTE — Assessment & Plan Note (Signed)
Patient presents with an asthma exacerbation. This exacerbation began 3 days ago.  Associated symptoms include dyspnea, nonproductive cough and wheezing.  Suspected precipitants include recent weather change, rain, cold air.  Symptoms have been gradually worsening since their onset.  This is the first evaluation that has occurred during this exacerbation. She has been taking Symbicort, Proventil, and singulair.  Patient Instructions  Depo medrol given in office today Will order azithromycin Will order prednisone taper Continue Symbicort Continue Proventil as needed Continue Singulair Will order tessalon perles  Follow up: Follow up with Dr. Annamaria Boots in 2 weeks or sooner if needed

## 2018-10-09 NOTE — Telephone Encounter (Signed)
Pt requesting a refill of tussionex cough medicine.  Pt had OV with [facility] today 6/18. Tonya, please advise if you are okay refilling med for pt. Thanks!

## 2018-10-09 NOTE — Telephone Encounter (Signed)
Called and spoke with pt letting her know that [facility] refilled her tussionex cough med. Pt expressed understanding. Nothing further needed.

## 2018-10-09 NOTE — Progress Notes (Signed)
@Patient  ID: [name], female    DOB: February 29, 1966, 53 y.o.   MRN: [id number]  Chief Complaint  Patient presents with  . Asthma    Since 10/07/18 it has been worse.    Referring provider: Benito Mccreedy, MD  HPI 53 year old female never smoker, followed for moderate persistent asthma, allergic rhinitis, OSA by Dr. Annamaria Boots. PMH includes GERD, HTN, pseudotumor cerebri, migraine. Failed Xolair in the past.  Tests: Office Spirometry 07/02/2014-within normal limits. FVC 2.39/87%, FEV1 2.04/89%, FEV1/FVC 2.04, FEF 25-75 percent 2.59/87%. Unattended Home Sleep Test 07/15/16-AHI 13.9/hour, desaturation to 74%, body weight 240 pounds Resp Allergy Profile 06/29/16- Pos dog and timothy grass, IgE 41 HST6/17/19-AHI 6.9/hour, desaturation to 81%, body weight 249 lbs  OV 10/09/18 - shortness of breath/wheezing Patient presents today for  She has previously been evaluated here for asthma and now presents with an asthma exacerbation. This exacerbation began 3 days ago.  Associated symptoms include dyspnea, nonproductive cough and wheezing.  Suspected precipitants include recent weather change, rain, cold air.  Symptoms have been gradually worsening since their onset.  This is the first evaluation that has occurred during this exacerbation. She has been taking Symbicort, Proventil, and singulair. The patient reports adherence to this regimen. Denies f/c/s, n/v/d, hemoptysis, PND, leg swelling.    Allergies  Allergen Reactions  . Influenza A (H1n1) Monoval Vac     Reaction: systemic reaction  . Omalizumab     Arvid Right* REACTION: angioedema-looses airway  . Promethazine Hcl     REACTION: hallucinations    Immunization History  Administered Date(s) Administered  . Pneumococcal Polysaccharide-23 05/24/2009  . Pneumococcal-Unspecified 07/06/2016    Past Medical History:  Diagnosis Date  . Allergic rhinitis   . Asthma    h/o intubation 2001  . Dysphagia    Dr. Ardis Hughs.  egd w/ dilatation  06/08/2007  . Esophageal dilatation   . GERD (gastroesophageal reflux disease)   . Headache    hx migraines  . Hypertension in pregnancy    pregnancy induced htn  . Meniscal injury   . Morbid obesity with body mass index of 45.0-49.9 in adult [facility])   . Prolapsed internal hemorrhoids, grade 2 09/23/2017  . Pseudotumor cerebri    has required LP for release of pressure  . Steroid-induced hyperglycemia 05/08/2013    Tobacco History: Social History   Tobacco Use  Smoking Status Never Smoker  Smokeless Tobacco Never Used   Counseling given: Yes   Outpatient Encounter Medications as of 10/09/2018  Medication Sig  . albuterol (PROVENTIL) (2.5 MG/3ML) 0.083% nebulizer solution USE 1 VIAL VIA NEBULIZER EVERY 6 HOURS AS NEEDED FOR WHEEZING OR SHORTNESS OF BREATH  . Azelastine-Fluticasone (DYMISTA) 137-50 MCG/ACT SUSP One puff twice daily ea nostril  . budesonide-formoterol (SYMBICORT) 160-4.5 MCG/ACT inhaler Inhale 2 puffs into the lungs 2 (two) times daily. Rinse mouth  . chlorpheniramine-HYDROcodone (TUSSIONEX) 10-8 MG/5ML SUER TAKE 5MLS BY MOUTH EVERY 12 HOURS AS NEEDED FOR COUGH FOR UP TO 7 DAYS  . cyclobenzaprine (FLEXERIL) 5 MG tablet Take 1 tablet (5 mg total) by mouth 3 (three) times daily as needed for muscle spasms (or migraines).  Marland Kitchen econazole nitrate 1 % cream As needed  . EPINEPHRINE 0.3 mg/0.3 mL IJ SOAJ injection INJECT INTO THIGH FOR SEVERE ASTHMA/ ALLERGIC REACTION  . furosemide (LASIX) 40 MG tablet TAKE 1 TABLET BY MOUTH ONCE DAILY AS NEEDED  . ibuprofen (ADVIL,MOTRIN) 800 MG tablet Take 1 tablet by mouth daily as needed.  Marland Kitchen levocetirizine (XYZAL) 5 MG tablet  Take 1 tablet (5 mg total) by mouth every evening.  . loratadine (CLARITIN) 10 MG tablet Take 1 tablet (10 mg total) by mouth daily.  Marland Kitchen LORazepam (ATIVAN) 1 MG tablet 1 every 8 hours if needed for anxiety  . montelukast (SINGULAIR) 10 MG tablet Take 1 tablet (10 mg total) by mouth at bedtime.  Marland Kitchen olopatadine (PATANOL)  0.1 % ophthalmic solution INSTILL 1 DROP INTO BOTH EYES TWO TIMES DAILY AS NEEDED FOR ALLERGIES  . predniSONE (DELTASONE) 10 MG tablet 4 X 2 DAYS, 3 X 2 DAYS, 2 X 2 DAYS, 1 X 2 DAYS  . Spacer/Aero-Holding Chambers (AEROCHAMBER MV) inhaler Use as instructed  . traMADol (ULTRAM) 50 MG tablet TAKE 1 TABLET BY MOUTH EVERY 4 HOURS AS NEEDED FOR PAIN  . TRANSDERM-SCOP, 1.5 MG, 1 MG/3DAYS APPLY 1 PATCH ONTO THE SKIN EVERY 3 DAYS.  Marland Kitchen urea (CARMOL) 40 % CREA As needed  . VENTOLIN HFA 108 (90 Base) MCG/ACT inhaler INHALE 2 PUFFS BY MOUTH INTO THE LUNGS EVERY 6 HOURS AS NEEDED FOR WHEEZING  . azithromycin (ZITHROMAX) 250 MG tablet Take 2 tablets (500 mg) on day 1, then take 1 tablet (250 mg) on days 2-5  . benzonatate (TESSALON) 200 MG capsule Take 1 capsule (200 mg total) by mouth 3 (three) times daily as needed for cough.  . predniSONE (DELTASONE) 10 MG tablet Take 4 tabs for 2 days, then 3 tabs for 2 days, then 2 tabs for 2 days, then 1 tab for 2 days, then stop  . [DISCONTINUED] budesonide-formoterol (SYMBICORT) 160-4.5 MCG/ACT inhaler Inhale 2 puffs into the lungs 2 (two) times daily. (Patient not taking: Reported on 10/09/2018)  . [EXPIRED] methylPREDNISolone acetate (DEPO-MEDROL) injection 120 mg    No facility-administered encounter medications on file as of 10/09/2018.      Review of Systems  Review of Systems  Constitutional: Negative.  Negative for chills and fever.  HENT: Negative.   Respiratory: Positive for cough, shortness of breath and wheezing.   Cardiovascular: Negative.  Negative for chest pain, palpitations and leg swelling.  Gastrointestinal: Negative.   Allergic/Immunologic: Negative.   Neurological: Negative.   Psychiatric/Behavioral: Negative.        Physical Exam  BP 128/70 (BP Location: Left Arm, Patient Position: Sitting, Cuff Size: Normal)   Pulse 68   Temp 98.2 F (36.8 C)   Ht 5\' 3"  (1.6 m)   Wt 263 lb (119.3 kg)   SpO2 97%   BMI 46.59 kg/m   Wt Readings  from Last 5 Encounters:  10/09/18 263 lb (119.3 kg)  06/18/18 237 lb (107.5 kg)  11/07/17 237 lb (107.5 kg)  12/20/16 249 lb (112.9 kg)  09/19/16 271 lb 9.6 oz (123.2 kg)     Physical Exam Vitals signs and nursing note reviewed.  Constitutional:      General: She is not in acute distress.    Appearance: She is well-developed.  Cardiovascular:     Rate and Rhythm: Normal rate and regular rhythm.  Pulmonary:     Effort: Pulmonary effort is normal. No respiratory distress.     Breath sounds: Normal breath sounds. No wheezing or rhonchi.  Musculoskeletal:        General: No swelling.  Neurological:     Mental Status: She is alert and oriented to person, place, and time.       Assessment & Plan:   Moderate persistent asthma with exacerbation Patient presents with an asthma exacerbation. This exacerbation began 3 days ago.  Associated  symptoms include dyspnea, nonproductive cough and wheezing.  Suspected precipitants include recent weather change, rain, cold air.  Symptoms have been gradually worsening since their onset.  This is the first evaluation that has occurred during this exacerbation. She has been taking Symbicort, Proventil, and singulair.  Patient Instructions  Depo medrol given in office today Will order azithromycin Will order prednisone taper Continue Symbicort Continue Proventil as needed Continue Singulair Will order tessalon perles  Follow up: Follow up with Dr. Annamaria Boots in 2 weeks or sooner if needed        Fenton Foy, NP 10/09/2018

## 2018-10-09 NOTE — Patient Instructions (Signed)
Depo medrol given in office today Will order azithromycin Will order prednisone taper Continue Symbicort Continue Proventil as needed Continue Singulair Will order tessalon perles  Follow up: Follow up with Dr. Annamaria Boots in 2 weeks or sooner if needed

## 2018-10-28 ENCOUNTER — Encounter: Payer: Self-pay | Admitting: Nurse Practitioner

## 2018-10-28 ENCOUNTER — Other Ambulatory Visit: Payer: Self-pay

## 2018-10-28 ENCOUNTER — Ambulatory Visit (INDEPENDENT_AMBULATORY_CARE_PROVIDER_SITE_OTHER): Payer: 59 | Admitting: Nurse Practitioner

## 2018-10-28 DIAGNOSIS — J4520 Mild intermittent asthma, uncomplicated: Secondary | ICD-10-CM | POA: Diagnosis not present

## 2018-10-28 NOTE — Assessment & Plan Note (Signed)
Recent asthma flare has resolved.  Patient is compliant with Symbicort and Proventil.  Patient Instructions  Continue Symbicort Continue Proventil as needed Continue Singulair Continue tessalon perles as needed  Follow up: Follow up with Dr. Annamaria Boots in around 4-6 weeks or sooner if needed

## 2018-10-28 NOTE — Progress Notes (Signed)
@Patient  ID: [name], female    DOB: 1965-06-14, 53 y.o.   MRN: [id number]  Chief Complaint  Patient presents with  . Follow-up    Referring provider: Benito Mccreedy, MD  HPI   53 year old female never smoker, followed for moderate persistent asthma, allergic rhinitis, OSA by Dr. Annamaria Boots. PMH includes GERD, HTN, pseudotumor cerebri, migraine. Failed Xolair in the past.  Tests: Office Spirometry 07/02/2014-within normal limits. FVC 2.39/87%, FEV1 2.04/89%, FEV1/FVC 2.04, FEF 25-75 percent 2.59/87%. Unattended Home Sleep Test 07/15/16-AHI 13.9/hour, desaturation to 74%, body weight 240 pounds Resp Allergy Profile 06/29/16- Pos dog and timothy grass, IgE 41 HST6/17/19-AHI 6.9/hour, desaturation to 81%, body weight 249 lbs  OV 10/28/18 - follow up  Patient presents today for a follow-up after recent asthma flare.  She has completed azithromycin and prednisone taper.  She is compliant with Symbicort and Proventil as needed.  She also takes Singulair and has [employer] as needed for cough.  She states that she is much improved since completing azithromycin and prednisone.  She has been checking peak flow at home.  She states that at times she is not compliant with her inhalers but she is now trying to do better and take as directed. Denies f/c/s, n/v/d, hemoptysis, PND, leg swelling.      Allergies  Allergen Reactions  . Influenza A (H1n1) Monoval Vac     Reaction: systemic reaction  . Omalizumab     Arvid Right* REACTION: angioedema-looses airway  . Promethazine Hcl     REACTION: hallucinations    Immunization History  Administered Date(s) Administered  . Pneumococcal Polysaccharide-23 05/24/2009  . Pneumococcal-Unspecified 07/06/2016    Past Medical History:  Diagnosis Date  . Allergic rhinitis   . Asthma    h/o intubation 2001  . Dysphagia    Dr. Ardis Hughs.  egd w/ dilatation 06/08/2007  . Esophageal dilatation   . GERD (gastroesophageal reflux disease)   .  Headache    hx migraines  . Hypertension in pregnancy    pregnancy induced htn  . Meniscal injury   . Morbid obesity with body mass index of 45.0-49.9 in adult [facility])   . Prolapsed internal hemorrhoids, grade 2 09/23/2017  . Pseudotumor cerebri    has required LP for release of pressure  . Steroid-induced hyperglycemia 05/08/2013    Tobacco History: Social History   Tobacco Use  Smoking Status Never Smoker  Smokeless Tobacco Never Used   Counseling given: Not Answered   Outpatient Encounter Medications as of 10/28/2018  Medication Sig  . albuterol (PROVENTIL) (2.5 MG/3ML) 0.083% nebulizer solution USE 1 VIAL VIA NEBULIZER EVERY 6 HOURS AS NEEDED FOR WHEEZING OR SHORTNESS OF BREATH  . Azelastine-Fluticasone (DYMISTA) 137-50 MCG/ACT SUSP One puff twice daily ea nostril  . benzonatate (TESSALON) 200 MG capsule Take 1 capsule (200 mg total) by mouth 3 (three) times daily as needed for cough.  . budesonide-formoterol (SYMBICORT) 160-4.5 MCG/ACT inhaler Inhale 2 puffs into the lungs 2 (two) times daily. Rinse mouth  . chlorpheniramine-HYDROcodone (TUSSIONEX) 10-8 MG/5ML SUER TAKE 5 MLS BY MOUTH EVERY 12 HOURS AS NEEDED FOR COUGH FOR UP TO 7 DAYS  . cyclobenzaprine (FLEXERIL) 5 MG tablet Take 1 tablet (5 mg total) by mouth 3 (three) times daily as needed for muscle spasms (or migraines).  Marland Kitchen econazole nitrate 1 % cream As needed  . EPINEPHRINE 0.3 mg/0.3 mL IJ SOAJ injection INJECT INTO THIGH FOR SEVERE ASTHMA/ ALLERGIC REACTION  . furosemide (LASIX) 40 MG tablet TAKE 1 TABLET BY  MOUTH ONCE DAILY AS NEEDED  . ibuprofen (ADVIL,MOTRIN) 800 MG tablet Take 1 tablet by mouth daily as needed.  Marland Kitchen levocetirizine (XYZAL) 5 MG tablet Take 1 tablet (5 mg total) by mouth every evening.  . loratadine (CLARITIN) 10 MG tablet Take 1 tablet (10 mg total) by mouth daily.  Marland Kitchen LORazepam (ATIVAN) 1 MG tablet 1 every 8 hours if needed for anxiety  . montelukast (SINGULAIR) 10 MG tablet Take 1 tablet (10 mg total)  by mouth at bedtime.  Marland Kitchen olopatadine (PATANOL) 0.1 % ophthalmic solution INSTILL 1 DROP INTO BOTH EYES TWO TIMES DAILY AS NEEDED FOR ALLERGIES  . Spacer/Aero-Holding Chambers (AEROCHAMBER MV) inhaler Use as instructed  . traMADol (ULTRAM) 50 MG tablet TAKE 1 TABLET BY MOUTH EVERY 4 HOURS AS NEEDED FOR PAIN  . TRANSDERM-SCOP, 1.5 MG, 1 MG/3DAYS APPLY 1 PATCH ONTO THE SKIN EVERY 3 DAYS.  Marland Kitchen urea (CARMOL) 40 % CREA As needed  . VENTOLIN HFA 108 (90 Base) MCG/ACT inhaler INHALE 2 PUFFS BY MOUTH INTO THE LUNGS EVERY 6 HOURS AS NEEDED FOR WHEEZING  . azithromycin (ZITHROMAX) 250 MG tablet Take 2 tablets (500 mg) on day 1, then take 1 tablet (250 mg) on days 2-5 (Patient not taking: Reported on 10/28/2018)  . [DISCONTINUED] predniSONE (DELTASONE) 10 MG tablet 4 X 2 DAYS, 3 X 2 DAYS, 2 X 2 DAYS, 1 X 2 DAYS (Patient not taking: Reported on 10/28/2018)  . [DISCONTINUED] predniSONE (DELTASONE) 10 MG tablet Take 4 tabs for 2 days, then 3 tabs for 2 days, then 2 tabs for 2 days, then 1 tab for 2 days, then stop (Patient not taking: Reported on 10/28/2018)   No facility-administered encounter medications on file as of 10/28/2018.      Review of Systems  Review of Systems  Constitutional: Negative.  Negative for chills and fever.  HENT: Negative.   Respiratory: Positive for cough. Negative for shortness of breath and wheezing.   Cardiovascular: Negative.  Negative for chest pain, palpitations and leg swelling.  Gastrointestinal: Negative.   Allergic/Immunologic: Negative.   Neurological: Negative.   Psychiatric/Behavioral: Negative.        Physical Exam  BP 128/68 (BP Location: Left Arm, Patient Position: Sitting, Cuff Size: Large)   Pulse 76   Temp 98.2 F (36.8 C)   Ht 5\' 3"  (1.6 m)   Wt 246 lb (111.6 kg)   SpO2 98%   BMI 43.58 kg/m   Wt Readings from Last 5 Encounters:  10/28/18 246 lb (111.6 kg)  10/09/18 263 lb (119.3 kg)  06/18/18 237 lb (107.5 kg)  11/07/17 237 lb (107.5 kg)  12/20/16  249 lb (112.9 kg)     Physical Exam Vitals signs and nursing note reviewed.  Constitutional:      General: She is not in acute distress.    Appearance: She is well-developed.  Cardiovascular:     Rate and Rhythm: Normal rate and regular rhythm.  Pulmonary:     Effort: Pulmonary effort is normal. No respiratory distress.     Breath sounds: Normal breath sounds. No wheezing or rhonchi.  Musculoskeletal:        General: No swelling.  Neurological:     Mental Status: She is alert and oriented to person, place, and time.       Assessment & Plan:   Allergic asthma, mild intermittent, uncomplicated Recent asthma flare has resolved.  Patient is compliant with Symbicort and Proventil.  Patient Instructions  Continue Symbicort Continue Proventil as needed Continue  Singulair Continue tessalon perles as needed  Follow up: Follow up with Dr. Annamaria Boots in around 4-6 weeks or sooner if needed       Fenton Foy, NP 10/28/2018

## 2018-10-28 NOTE — Patient Instructions (Signed)
Continue Symbicort Continue Proventil as needed Continue Singulair Continue tessalon perles as needed  Follow up: Follow up with Dr. Annamaria Boots in around 4-6 weeks or sooner if needed

## 2018-10-31 ENCOUNTER — Other Ambulatory Visit: Payer: Self-pay

## 2018-10-31 DIAGNOSIS — L292 Pruritus vulvae: Secondary | ICD-10-CM | POA: Diagnosis not present

## 2018-10-31 DIAGNOSIS — Z20822 Contact with and (suspected) exposure to covid-19: Secondary | ICD-10-CM

## 2018-10-31 MED FILL — MONTELUKAST SOD 10 MG TAB: 10 | 30 days supply | Qty: 30 | Fill #2

## 2018-10-31 MED FILL — TRIAMCINOLONE 0.1% OINTMENT: 0.1 | 15 days supply | Qty: 30 | Fill #0

## 2018-10-31 MED FILL — AZELASTINE-FLUTICASONE 137-: 137-50 | 30 days supply | Qty: 23 | Fill #3

## 2018-10-31 MED FILL — LEVOCETIRIZINE 5 MG TABLET: 5 | 30 days supply | Qty: 30 | Fill #2

## 2018-11-06 LAB — NOVEL CORONAVIRUS, NAA: SARS-CoV-2, NAA: NOT DETECTED

## 2019-01-07 ENCOUNTER — Encounter: Payer: Self-pay | Admitting: Internal Medicine

## 2019-01-07 ENCOUNTER — Other Ambulatory Visit: Payer: Self-pay

## 2019-01-07 ENCOUNTER — Ambulatory Visit (INDEPENDENT_AMBULATORY_CARE_PROVIDER_SITE_OTHER): Payer: 59 | Admitting: Internal Medicine

## 2019-01-07 DIAGNOSIS — G4733 Obstructive sleep apnea (adult) (pediatric): Secondary | ICD-10-CM

## 2019-01-07 DIAGNOSIS — J4520 Mild intermittent asthma, uncomplicated: Secondary | ICD-10-CM

## 2019-01-07 MED ORDER — BENZONATATE 200 MG PO CAPS: 200.0000 mg | ORAL_CAPSULE | Freq: Three times a day (TID) | ORAL | 1 refills | Status: DC | PRN

## 2019-01-07 MED ORDER — HYDROCOD POLST-CPM POLST ER 10-8 MG/5ML PO SUER: ORAL | 0 refills | Status: DC

## 2019-01-07 MED ORDER — EPINEPHRINE 0.3 MG/0.3ML IJ SOAJ: INTRAMUSCULAR | 12 refills | Status: DC

## 2019-01-07 MED ORDER — OLOPATADINE HCL 0.1 % OP SOLN: OPHTHALMIC | 5 refills | Status: DC

## 2019-01-07 MED ORDER — AZELASTINE-FLUTICASONE 137-50 MCG/ACT NA SUSP: NASAL | 5 refills | Status: DC

## 2019-01-07 MED ORDER — TRAMADOL HCL 50 MG PO TABS: 50.0000 mg | ORAL_TABLET | ORAL | 5 refills | Status: DC | PRN

## 2019-01-07 MED ORDER — LEVOCETIRIZINE DIHYDROCHLORIDE 5 MG PO TABS: 5.0000 mg | ORAL_TABLET | Freq: Every evening | ORAL | 5 refills | Status: DC

## 2019-01-07 MED ORDER — MONTELUKAST SODIUM 10 MG PO TABS: 10.0000 mg | ORAL_TABLET | Freq: Every day | ORAL | 11 refills | Status: DC

## 2019-01-07 MED ORDER — VENTOLIN HFA 108 (90 BASE) MCG/ACT IN AERS: INHALATION_SPRAY | RESPIRATORY_TRACT | 12 refills | Status: DC

## 2019-01-07 MED ORDER — ALBUTEROL SULFATE (2.5 MG/3ML) 0.083% IN NEBU: INHALATION_SOLUTION | RESPIRATORY_TRACT | 12 refills | Status: DC

## 2019-01-07 MED ORDER — LORAZEPAM 1 MG PO TABS: ORAL_TABLET | ORAL | 3 refills | Status: DC

## 2019-01-07 MED ORDER — BUDESONIDE-FORMOTEROL FUMARATE 160-4.5 MCG/ACT IN AERO: 2.0000 | INHALATION_SPRAY | Freq: Two times a day (BID) | RESPIRATORY_TRACT | 12 refills | Status: DC

## 2019-01-07 MED FILL — traMADol HCL 50 MG TABS: 50 | 5 days supply | Qty: 30 | Fill #0

## 2019-01-07 MED FILL — OLOPATADINE HCL 0.1 % SOLN: 0.1 | 19 days supply | Qty: 5 | Fill #0

## 2019-01-07 MED FILL — SYMBICORT 160-4.5 MCG INH: 160-4.5 | 30 days supply | Qty: 10 | Fill #0

## 2019-01-07 MED FILL — AZELASTINE-FLUTICASONE 137-: 137-50 | 30 days supply | Qty: 23 | Fill #0

## 2019-01-07 MED FILL — LORazepam 1 MG TABS: 1 | 10 days supply | Qty: 30 | Fill #0

## 2019-01-07 MED FILL — ALBUTEROL 0.083 MG/ML SOLN: (2.5 MG/3ML | 10 days supply | Qty: 90 | Fill #0

## 2019-01-07 MED FILL — MONTELUKAST SOD 10 MG TAB: 10 | 30 days supply | Qty: 30 | Fill #0

## 2019-01-07 MED FILL — ALBUTEROL SULFATE HFA 108 (: 108 (90 BAS | 25 days supply | Qty: 18 | Fill #0

## 2019-01-07 MED FILL — LEVOCETIRIZINE 5 MG TABLET: 5 | 30 days supply | Qty: 30 | Fill #0

## 2019-01-07 MED FILL — HYDROCODONE-CHLORPHEN ER SU: 10-8 | 12 days supply | Qty: 120 | Fill #0

## 2019-01-07 MED FILL — EPINEPHRINE 0.3 MG AUTO-INJ: 0.3 | 30 days supply | Qty: 2 | Fill #0

## 2019-01-07 MED FILL — BENZONATATE 200 MG CAP: 200 | 10 days supply | Qty: 30 | Fill #0

## 2019-01-07 NOTE — Assessment & Plan Note (Signed)
Was not compliant with CPAP and was referred to Orthodontist for consideration of oral appliance.

## 2019-01-07 NOTE — Assessment & Plan Note (Signed)
She refused to be weighed, and clearly understands she is too heavy and needs to make real life-style changes to help this.

## 2019-01-07 NOTE — Patient Instructions (Signed)
Meds refilled  Please take care this flu/ Covid season. Call if we can help

## 2019-01-07 NOTE — Assessment & Plan Note (Signed)
Without acute exacerbation, but she is concerned about potential exacerbation in Fall weather based on past history. Plan- meds discussed and refilled.

## 2019-01-07 NOTE — Progress Notes (Signed)
Subjective:    Patient ID: [name], female    DOB: 1965/07/11, 53 y.o.   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 FVC 2.39/87%, FEV1 2.04/89%, FEV1/FVC 2.04, FEF 25-75 percent 2.59/87%. Unattended Home Sleep Test 07/15/16-AHI 13.9/hour, desaturation to 74%, body weight 240 pounds Resp Allergy Profile 06/29/16- Pos dog and timothy grass, IgE 41 HST 10/07/17-AHI 6.9/hour, desaturation to 81%, body weight 249 pounds -----------------------------------------------------------------------.   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 Breathing has been okay having some problems with the heat, she also needs medication.   Weight today 237 lbs Says that work stress is a trigger for her asthma symptoms.  Denies heartburn/reflux.  Blames Protonix for hair loss and changing to Tagamet. 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 NP worked with her for asthma flare this summer. Covid neg 7/10 ------pt states her breathing is "okay", reports mild cough with seasonal changes. Asks all meds be refilled. Neb albuterol, Dymista, Symbicort 160, singulair, albuterol hfa,  Refused to be weighed Declines flu vax- "allergic" Feels in control of her breathing currently, but  expects more trouble during Fall weather.  ROS-see HPI    + = positive Constitutional:   +  weight loss, night sweats, fevers, chills, +fatigue, lassitude. HEENT:   No-  headaches, difficulty swallowing, tooth/dental problems, sore throat,       No-  sneezing, itching, ear ache, nasal congestion, post nasal drip,  CV:  No-   chest pain, orthopnea, PND, swelling in lower extremities, anasarca,                                                   dizziness, palpitations Resp: + shortness of breath with exertion or at rest.                 productive cough, non-productive cough,  No- coughing up of blood.                  change in color of mucus.  wheezing.   Skin: No-   rash or lesions. GI:  No-   heartburn, indigestion, abdominal pain, nausea, vomiting,  GU:  MS:  No-   joint pain or swelling.   Neuro-     nothing unusual Psych:  No- change in mood or affect. No depression or anxiety.  No memory loss.  OBJ- Physical Exam General- Alert, Oriented, Affect-appropriate, Distress- none acute,  + morbidly obese Skin- rash-none, lesions- none, excoriation- none Lymphadenopathy- none Head- atraumatic            Eyes- Gross vision intact, PERRLA, conjunctivae and secretions clear            Ears- Hearing, canals-normal  Nose- sniffing-none, no-Septal dev, mucus, polyps, erosion, perforation             Throat- Mallampati III , mucosa clear , drainage- none, tonsils- atrophic Neck- flexible , trachea midline, no stridor , thyroid nl, carotid no bruit Chest - symmetrical excursion , unlabored           Heart/CV- RRR , no murmur , no gallop  , no rub, nl s1 s2                           - JVD- none , edema- none, stasis changes- none, varices- none           Lung-   clear, wheeze -none, cough-none , dullness-none, rub- none           Chest wall-  Abd-  Br/ Gen/ Rectal- Not done, not indicated Extrem- cyanosis- none, clubbing, none, atrophy- none, strength- nl, cane Neuro- grossly intact  to observation    Assessment & Plan:

## 2019-01-28 DIAGNOSIS — M79671 Pain in right foot: Secondary | ICD-10-CM | POA: Diagnosis not present

## 2019-01-28 DIAGNOSIS — M21612 Bunion of left foot: Secondary | ICD-10-CM | POA: Diagnosis not present

## 2019-01-28 DIAGNOSIS — M84374A Stress fracture, right foot, initial encounter for fracture: Secondary | ICD-10-CM | POA: Diagnosis not present

## 2019-01-28 DIAGNOSIS — M21611 Bunion of right foot: Secondary | ICD-10-CM | POA: Diagnosis not present

## 2019-02-13 DIAGNOSIS — J302 Other seasonal allergic rhinitis: Secondary | ICD-10-CM | POA: Diagnosis not present

## 2019-02-13 DIAGNOSIS — J4540 Moderate persistent asthma, uncomplicated: Secondary | ICD-10-CM | POA: Diagnosis not present

## 2019-02-13 DIAGNOSIS — K219 Gastro-esophageal reflux disease without esophagitis: Secondary | ICD-10-CM | POA: Diagnosis not present

## 2019-02-13 DIAGNOSIS — G4733 Obstructive sleep apnea (adult) (pediatric): Secondary | ICD-10-CM | POA: Diagnosis not present

## 2019-02-13 DIAGNOSIS — R7303 Prediabetes: Secondary | ICD-10-CM | POA: Diagnosis not present

## 2019-02-13 DIAGNOSIS — I10 Essential (primary) hypertension: Secondary | ICD-10-CM | POA: Diagnosis not present

## 2019-02-13 DIAGNOSIS — E782 Mixed hyperlipidemia: Secondary | ICD-10-CM | POA: Diagnosis not present

## 2019-02-13 DIAGNOSIS — Z0001 Encounter for general adult medical examination with abnormal findings: Secondary | ICD-10-CM | POA: Diagnosis not present

## 2019-02-13 MED FILL — LEVOCETIRIZINE 5 MG TABLET: 5 | 90 days supply | Qty: 90 | Fill #0

## 2019-02-13 MED FILL — AMLODIPINE BESYLATE 10 MG T: 10 | 90 days supply | Qty: 90 | Fill #0

## 2019-02-13 MED FILL — FUROSEMIDE 40 MG TAB: 40 | 90 days supply | Qty: 90 | Fill #0

## 2019-02-18 DIAGNOSIS — M21611 Bunion of right foot: Secondary | ICD-10-CM | POA: Diagnosis not present

## 2019-02-18 DIAGNOSIS — D1724 Benign lipomatous neoplasm of skin and subcutaneous tissue of left leg: Secondary | ICD-10-CM | POA: Diagnosis not present

## 2019-02-18 DIAGNOSIS — M84374D Stress fracture, right foot, subsequent encounter for fracture with routine healing: Secondary | ICD-10-CM | POA: Diagnosis not present

## 2019-02-18 DIAGNOSIS — M21612 Bunion of left foot: Secondary | ICD-10-CM | POA: Diagnosis not present

## 2019-02-25 ENCOUNTER — Telehealth: Payer: 59 | Admitting: Family

## 2019-02-25 DIAGNOSIS — B002 Herpesviral gingivostomatitis and pharyngotonsillitis: Secondary | ICD-10-CM | POA: Diagnosis not present

## 2019-02-25 MED ORDER — VALACYCLOVIR HCL 1 G PO TABS: 2000.0000 mg | ORAL_TABLET | Freq: Two times a day (BID) | ORAL | 1 refills | Status: DC

## 2019-02-25 MED FILL — valACYclovir HCL 1 GM TABS: 1 | 1 days supply | Qty: 4 | Fill #0

## 2019-02-25 NOTE — Progress Notes (Signed)
We are sorry that you are not feeling well.  Here is how we plan to help!  Based on what you have shared with me it looks like you have a Cold Sore. A cold sore (fever blister) is a skin infection caused by the herpes simplex virus (HSV-1). HSV-1 is closely related to the virus that causes genital herpes (HSV-2), but they are not the same even though both viruses can cause oral and genital infections. Cold sores are small, fluid-filled sores inside of the mouth or on the lips, gums, nose, chin, cheeks, or fingers.   The herpes simplex virus can be easily passed (contagious) to other people through close personal contact, such as kissing or sharing personal items. The virus can also spread to other parts of the body, such as the eyes or genitals. Cold sores are contagious until the sores crust over completely. They often heal within 2 weeks. Once a person is infected, the herpes simplex virus remains permanently in the body. Therefore, there is no cure for cold sores, and they often recur when a person is tired, stressed, sick, or gets too much sun. Additional factors that can cause a recurrence include hormone changes in menstruation or pregnancy, certain drugs, and cold weather.  CAUSES  Cold sores are caused by the herpes simplex virus. The virus is spread from person to person through close contact, such as through kissing, touching the affected area, or sharing personal items such as lip balm, razors, or eating utensils.   I have sent in a prescription to your pharmacy of Valacyclovir, or Valtrex, 2000 mg twice a day for 1 day.   HOME CARE INSTRUCTIONS  Only take over-the-counter or prescription medicines for pain, discomfort, or fever as directed by your caregiver. Do not use aspirin.   Use a cotton-tip swab to apply creams or gels to your sores.   Do not touch the sores or pick the scabs. Wash your hands often. Do not touch your eyes without washing your hands first.   Avoid kissing, oral sex,  and sharing personal items until sores heal.   Apply an ice pack on your sores for 10-15 minutes to ease any discomfort.   Avoid hot, cold, or salty foods because they may hurt your mouth. Eat a soft, bland diet to avoid irritating the sores. Use a straw to drink if you have pain when drinking out of a glass.   Keep sores clean and dry to prevent an infection of other tissues.   Avoid the sun and limit stress if these things trigger outbreaks. If sun causes cold sores, apply sunscreen on the lips before being out in the sun.   SEEK MEDICAL CARE IF:  You have a fever or persistent symptoms for more than 2-3 days.   You have a fever and your symptoms suddenly get worse.   You have pus, not clear fluid, coming from the sores.   You have redness that is spreading.   You have pain or irritation in your eye.   You get sores on your genitals.   Your sores do not heal within 2 weeks.   You have a weakened immune system.   You have frequent recurrences of cold sores.     MAKE SURE YOU  Understand these instructions. Will watch your condition. Will get help right away if you are not doing well or get worse.  Your e-visit answers were reviewed by a board certified advanced clinical practitioner to complete your personal care plan.    Depending on the condition, your plan could have included both over the counter or prescription medications.  If there is a problem please reply  once you have received a response from your provider.  Your safety is important to us.  If you have drug allergies check your prescription carefully.    You can use MyChart to ask questions about today's visit, request a non-urgent call back, or ask for a work or school excuse.  You will get an e-mail in the next two days asking about your experience.  I hope that your e-visit has been valuable and will speed your recovery. Thank you for using e-visits.   Approximately 5 minutes was spent documenting and reviewing patient's  chart.   

## 2019-03-02 ENCOUNTER — Other Ambulatory Visit: Payer: Self-pay

## 2019-03-02 DIAGNOSIS — Z20822 Contact with and (suspected) exposure to covid-19: Secondary | ICD-10-CM

## 2019-03-03 LAB — NOVEL CORONAVIRUS, NAA: SARS-CoV-2, NAA: DETECTED — AB

## 2019-03-04 ENCOUNTER — Ambulatory Visit (INDEPENDENT_AMBULATORY_CARE_PROVIDER_SITE_OTHER): Payer: 59 | Admitting: Internal Medicine

## 2019-03-04 ENCOUNTER — Encounter: Payer: Self-pay | Admitting: Internal Medicine

## 2019-03-04 DIAGNOSIS — J4520 Mild intermittent asthma, uncomplicated: Secondary | ICD-10-CM

## 2019-03-04 DIAGNOSIS — U071 COVID-19: Secondary | ICD-10-CM | POA: Insufficient documentation

## 2019-03-04 MED ORDER — HYDROCOD POLST-CPM POLST ER 10-8 MG/5ML PO SUER: ORAL | 0 refills | Status: DC

## 2019-03-04 MED FILL — HYDROCODONE-CHLORPHEN ER SU: 10-8 | 12 days supply | Qty: 120 | Fill #0

## 2019-03-04 MED FILL — IBUPROFEN 800 MG TAB: 800 | 30 days supply | Qty: 90 | Fill #0

## 2019-03-04 NOTE — Assessment & Plan Note (Signed)
So far no worse than baseline. Has her meds. Does ask refill Tussionex

## 2019-03-04 NOTE — Progress Notes (Signed)
Subjective:    Patient ID: [name], female    DOB: 1965-08-31, 53 y.o.   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 FVC 2.39/87%, FEV1 2.04/89%, FEV1/FVC 2.04, FEF 25-75 percent 2.59/87%. Unattended Home Sleep Test 07/15/16-AHI 13.9/hour, desaturation to 74%, body weight 240 pounds Resp Allergy Profile 06/29/16- Pos dog and timothy grass, IgE 41 HST 10/07/17-AHI 6.9/hour, desaturation to 81%, body weight 249 pounds -----------------------------------------------------------------------.   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 NP worked with her for asthma flare this summer. Covid neg 7/10 ------pt states her breathing is "okay", reports mild cough with seasonal changes. Asks all meds be refilled. Neb albuterol, Dymista, Symbicort 160, singulair, albuterol hfa,  Refused to be weighed Declines flu vax- "allergic" Feels in control of her breathing currently, but expects more trouble during Fall weather.  03/04/2019-  Virtual Visit via Telephone Note  I connected with [name] on 03/04/19 at 11:00 AM EST by telephone and verified that I am speaking with the correct person using two identifiers.  Location: Patient: H Provider: O   I discussed the limitations, risks, security and privacy concerns of performing an evaluation and management service by telephone and the availability of in person appointments. I also discussed with the patient that there may be a patient responsible charge related to this service. The patient expressed understanding and agreed to  proceed.  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 Had been to someone returning from [place] who tested positive. Husband being tested now. Has been in touch with local healthcare advisors and may be able to enter a research protocol at [facility]. For now is self-quarantined. Cough, temp 103.8, taking  Tylenol. Feels she is controlled for now.  Observations/Objective:   Assessment and Plan: Covid infection- plan self quarantine, supportive care Asthma- controlled on present meds  Follow Up Instructions: 6 months   I discussed the assessment and treatment plan with the patient. The patient was provided an opportunity to ask questions and all were answered. The patient agreed with the plan and demonstrated an understanding of the instructions.   The patient was advised to call back or seek an in-person evaluation if the symptoms worsen or if the condition fails to improve as anticipated.  I provided 21 minutes of non-face-to-face time during this encounter.   Baird Lyons, MD   ROS-see HPI    + = positive Constitutional:     weight loss, night sweats, fevers, chills, +fatigue, lassitude. HEENT:   No-  headaches, difficulty swallowing, tooth/dental problems, sore throat,       No-  sneezing, itching, ear ache, nasal congestion, post nasal drip,  CV:  No-   chest pain, orthopnea, PND, swelling in lower extremities, anasarca,  dizziness, palpitations Resp: + shortness of breath with exertion or at rest.                 productive cough, non-productive cough,  No- coughing up of blood.                   change in color of mucus.  wheezing.   Skin: No-   rash or lesions. GI:  No-   heartburn, indigestion, abdominal pain, nausea, vomiting,  GU:  MS:  No-   joint pain or swelling.   Neuro-     nothing unusual Psych:  No- change in mood or affect. No depression or anxiety.  No memory loss.  OBJ- Physical Exam General- Alert, Oriented, Affect-appropriate, Distress- none acute,  + morbidly obese Skin- rash-none, lesions- none, excoriation- none Lymphadenopathy- none Head- atraumatic            Eyes- Gross vision intact, PERRLA, conjunctivae and secretions clear            Ears- Hearing, canals-normal            Nose- sniffing-none, no-Septal dev, mucus, polyps, erosion, perforation             Throat- Mallampati III , mucosa clear , drainage- none, tonsils- atrophic Neck- flexible , trachea midline, no stridor , thyroid nl, carotid no bruit Chest - symmetrical excursion , unlabored           Heart/CV- RRR , no murmur , no gallop  , no rub, nl s1 s2                           - JVD- none , edema- none, stasis changes- none, varices- none           Lung-   clear, wheeze -none, cough-none , dullness-none, rub- none           Chest wall-  Abd-  Br/ Gen/ Rectal- Not done, not indicated Extrem- cyanosis- none, clubbing, none, atrophy- none, strength- nl, cane Neuro- grossly intact to observation    Assessment & Plan:

## 2019-03-04 NOTE — Assessment & Plan Note (Signed)
Fever and cough x 3 days. Now self-quarantine at home. Understands to go to ER if she gets worse. Exploring her candidacy for an outpatient research protocol at [facility].

## 2019-03-04 NOTE — Patient Instructions (Signed)
Script sent for Tussionex refill  Stay well hydrated and follow the self-quarantine guidelines. It will be interesting if you get into a Research study for the Covid.  Please call if we can help. Go th the ER if you get worse.

## 2019-03-12 MED FILL — traMADol HCL 50 MG TABS: 50 | 5 days supply | Qty: 30 | Fill #1

## 2019-03-13 ENCOUNTER — Encounter (HOSPITAL_BASED_OUTPATIENT_CLINIC_OR_DEPARTMENT_OTHER): Payer: Self-pay | Admitting: *Deleted

## 2019-03-13 ENCOUNTER — Emergency Department (HOSPITAL_BASED_OUTPATIENT_CLINIC_OR_DEPARTMENT_OTHER)
Admission: EM | Admit: 2019-03-13 | Discharge: 2019-03-13 | Disposition: A | Discharge status: Home / Self Care | Payer: 59 | Attending: Emergency Medicine | Admitting: Emergency Medicine

## 2019-03-13 ENCOUNTER — Other Ambulatory Visit: Payer: Self-pay

## 2019-03-13 ENCOUNTER — Emergency Department (HOSPITAL_BASED_OUTPATIENT_CLINIC_OR_DEPARTMENT_OTHER): Payer: 59

## 2019-03-13 DIAGNOSIS — J45901 Unspecified asthma with (acute) exacerbation: Secondary | ICD-10-CM | POA: Diagnosis not present

## 2019-03-13 DIAGNOSIS — U071 COVID-19: Secondary | ICD-10-CM | POA: Insufficient documentation

## 2019-03-13 DIAGNOSIS — R0602 Shortness of breath: Secondary | ICD-10-CM | POA: Diagnosis not present

## 2019-03-13 DIAGNOSIS — Z79899 Other long term (current) drug therapy: Secondary | ICD-10-CM | POA: Insufficient documentation

## 2019-03-13 DIAGNOSIS — Z888 Allergy status to other drugs, medicaments and biological substances status: Secondary | ICD-10-CM | POA: Diagnosis not present

## 2019-03-13 DIAGNOSIS — Z887 Allergy status to serum and vaccine status: Secondary | ICD-10-CM | POA: Insufficient documentation

## 2019-03-13 MED ORDER — PREDNISONE 20 MG PO TABS: 40.0000 mg | ORAL_TABLET | Freq: Every day | ORAL | 0 refills | Status: DC

## 2019-03-13 MED ORDER — ALBUTEROL SULFATE HFA 108 (90 BASE) MCG/ACT IN AERS
4.0000 | INHALATION_SPRAY | Freq: Once | RESPIRATORY_TRACT | Status: AC
  Administered 2019-03-13: 4 via RESPIRATORY_TRACT
  Filled 2019-03-13: qty 6.7

## 2019-03-13 MED ORDER — PREDNISONE 20 MG PO TABS
40.0000 mg | ORAL_TABLET | Freq: Once | ORAL | Status: AC
  Administered 2019-03-13: 40 mg via ORAL
  Filled 2019-03-13: qty 2

## 2019-03-13 MED FILL — predniSONE 20 MG TABS: 20 | 5 days supply | Qty: 10 | Fill #0
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
## 2019-03-13 NOTE — ED Triage Notes (Addendum)
covid pos states short of breath  Cough  Feels like asthma flare up  Has not being using inhalers  Pt states thinks if she get solumedrol that will take care on her

## 2019-03-13 NOTE — ED Notes (Signed)
Pt on monitor 

## 2019-03-16 ENCOUNTER — Encounter (HOSPITAL_COMMUNITY): Payer: Self-pay

## 2019-03-16 ENCOUNTER — Encounter: Payer: Self-pay | Admitting: Primary Care

## 2019-03-16 ENCOUNTER — Emergency Department (HOSPITAL_COMMUNITY): Payer: 59

## 2019-03-16 ENCOUNTER — Telehealth (INDEPENDENT_AMBULATORY_CARE_PROVIDER_SITE_OTHER): Payer: 59 | Admitting: Primary Care

## 2019-03-16 ENCOUNTER — Emergency Department (HOSPITAL_COMMUNITY)
Admission: EM | Admit: 2019-03-16 | Discharge: 2019-03-16 | Disposition: A | Discharge status: Home / Self Care | Payer: 59 | Attending: Emergency Medicine | Admitting: Emergency Medicine

## 2019-03-16 ENCOUNTER — Telehealth: Payer: Self-pay | Admitting: Internal Medicine

## 2019-03-16 ENCOUNTER — Other Ambulatory Visit: Payer: Self-pay

## 2019-03-16 DIAGNOSIS — U071 COVID-19: Secondary | ICD-10-CM | POA: Diagnosis not present

## 2019-03-16 DIAGNOSIS — J4541 Moderate persistent asthma with (acute) exacerbation: Secondary | ICD-10-CM | POA: Diagnosis not present

## 2019-03-16 DIAGNOSIS — J4521 Mild intermittent asthma with (acute) exacerbation: Secondary | ICD-10-CM | POA: Diagnosis not present

## 2019-03-16 DIAGNOSIS — Z79899 Other long term (current) drug therapy: Secondary | ICD-10-CM | POA: Diagnosis not present

## 2019-03-16 DIAGNOSIS — J984 Other disorders of lung: Secondary | ICD-10-CM | POA: Diagnosis not present

## 2019-03-16 DIAGNOSIS — I10 Essential (primary) hypertension: Secondary | ICD-10-CM | POA: Insufficient documentation

## 2019-03-16 DIAGNOSIS — R509 Fever, unspecified: Secondary | ICD-10-CM | POA: Diagnosis not present

## 2019-03-16 DIAGNOSIS — R0602 Shortness of breath: Secondary | ICD-10-CM | POA: Diagnosis present

## 2019-03-16 LAB — COMPREHENSIVE METABOLIC PANEL
ALT: 22 U/L (ref 0–44)
AST: 18 U/L (ref 15–41)
Albumin: 4 g/dL (ref 3.5–5.0)
Alkaline Phosphatase: 76 U/L (ref 38–126)
Anion gap: 9 (ref 5–15)
BUN: 20 mg/dL (ref 6–20)
CO2: 27 mmol/L (ref 22–32)
Calcium: 9 mg/dL (ref 8.9–10.3)
Chloride: 103 mmol/L (ref 98–111)
Creatinine, Ser: 1.34 mg/dL — ABNORMAL HIGH (ref 0.44–1.00)
GFR calc Af Amer: 53 mL/min — ABNORMAL LOW (ref >60)
GFR calc non Af Amer: 46 mL/min — ABNORMAL LOW (ref >60)
Glucose, Bld: 107 mg/dL — ABNORMAL HIGH (ref 70–99)
Potassium: 3.1 mmol/L — ABNORMAL LOW (ref 3.5–5.1)
Sodium: 139 mmol/L (ref 135–145)
Total Bilirubin: 0.8 mg/dL (ref 0.3–1.2)
Total Protein: 7.1 g/dL (ref 6.5–8.1)

## 2019-03-16 LAB — I-STAT BETA HCG BLOOD, ED (MC, WL, AP ONLY): I-stat hCG, quantitative: <5 m[IU]/mL (ref <5)

## 2019-03-16 LAB — CBC WITH DIFFERENTIAL/PLATELET
Abs Immature Granulocytes: 0.04 10*3/uL (ref 0.00–0.07)
Basophils Absolute: 0 10*3/uL (ref 0.0–0.1)
Basophils Relative: 0 %
Eosinophils Absolute: 0.3 10*3/uL (ref 0.0–0.5)
Eosinophils Relative: 3 %
HCT: 38.6 % (ref 36.0–46.0)
Hemoglobin: 12 g/dL (ref 12.0–15.0)
Immature Granulocytes: 1 %
Lymphocytes Relative: 43 %
Lymphs Abs: 3.5 10*3/uL (ref 0.7–4.0)
MCH: 25.8 pg — ABNORMAL LOW (ref 26.0–34.0)
MCHC: 31.1 g/dL (ref 30.0–36.0)
MCV: 83 fL (ref 80.0–100.0)
Monocytes Absolute: 0.7 10*3/uL (ref 0.1–1.0)
Monocytes Relative: 9 %
Neutro Abs: 3.5 10*3/uL (ref 1.7–7.7)
Neutrophils Relative %: 44 %
Platelets: 344 10*3/uL (ref 150–400)
RBC: 4.65 MIL/uL (ref 3.87–5.11)
RDW: 13.5 % (ref 11.5–15.5)
WBC: 8.1 10*3/uL (ref 4.0–10.5)
nRBC: 0 % (ref 0.0–0.2)

## 2019-03-16 LAB — TRIGLYCERIDES: Triglycerides: 196 mg/dL — ABNORMAL HIGH (ref <150)

## 2019-03-16 LAB — PROCALCITONIN: Procalcitonin: <0.1 ng/mL

## 2019-03-16 LAB — D-DIMER, QUANTITATIVE: D-Dimer, Quant: 0.87 ug/mL-FEU — ABNORMAL HIGH (ref 0.00–0.50)

## 2019-03-16 LAB — C-REACTIVE PROTEIN: CRP: <0.8 mg/dL (ref <1.0)

## 2019-03-16 LAB — LACTIC ACID, PLASMA: Lactic Acid, Venous: 0.9 mmol/L (ref 0.5–1.9)

## 2019-03-16 LAB — LACTATE DEHYDROGENASE: LDH: 178 U/L (ref 98–192)

## 2019-03-16 LAB — FIBRINOGEN: Fibrinogen: 365 mg/dL (ref 210–475)

## 2019-03-16 LAB — FERRITIN: Ferritin: 188 ng/mL (ref 11–307)

## 2019-03-16 MED ORDER — LACTATED RINGERS IV BOLUS
500.0000 mL | Freq: Once | INTRAVENOUS | Status: AC
  Administered 2019-03-16: 500 mL via INTRAVENOUS

## 2019-03-16 MED ORDER — AEROCHAMBER Z-STAT PLUS/MEDIUM MISC
Status: AC
  Administered 2019-03-16: 19:00:00
  Filled 2019-03-16: qty 1

## 2019-03-16 MED ORDER — PREDNISONE 10 MG (21) PO TBPK: ORAL_TABLET | Freq: Every day | ORAL | 0 refills | Status: DC

## 2019-03-16 MED ORDER — IPRATROPIUM BROMIDE HFA 17 MCG/ACT IN AERS
2.0000 | INHALATION_SPRAY | Freq: Once | RESPIRATORY_TRACT | Status: AC
  Administered 2019-03-16: 2 via RESPIRATORY_TRACT
  Filled 2019-03-16: qty 12.9

## 2019-03-16 MED ORDER — POTASSIUM CHLORIDE CRYS ER 20 MEQ PO TBCR
60.0000 meq | EXTENDED_RELEASE_TABLET | Freq: Once | ORAL | Status: AC
  Administered 2019-03-16: 60 meq via ORAL
  Filled 2019-03-16: qty 3

## 2019-03-16 MED ORDER — ALBUTEROL SULFATE HFA 108 (90 BASE) MCG/ACT IN AERS
4.0000 | INHALATION_SPRAY | Freq: Once | RESPIRATORY_TRACT | Status: AC
  Administered 2019-03-16: 4 via RESPIRATORY_TRACT
  Filled 2019-03-16: qty 6.7

## 2019-03-16 MED ORDER — DM-GUAIFENESIN ER 30-600 MG PO TB12: 1.0000 | ORAL_TABLET | Freq: Two times a day (BID) | ORAL | 0 refills | Status: DC

## 2019-03-16 MED ORDER — DM-GUAIFENESIN ER 30-600 MG PO TB12
1.0000 | ORAL_TABLET | Freq: Two times a day (BID) | ORAL | Status: DC
  Administered 2019-03-16: 1 via ORAL
  Filled 2019-03-16: qty 1

## 2019-03-16 MED ORDER — MAGNESIUM SULFATE 2 GM/50ML IV SOLN
2.0000 g | Freq: Once | INTRAVENOUS | Status: AC
  Administered 2019-03-16: 2 g via INTRAVENOUS
  Filled 2019-03-16: qty 50

## 2019-03-16 MED ORDER — PREDNISONE 10 MG PO TABS: ORAL_TABLET | ORAL | 0 refills | Status: DC

## 2019-03-16 MED ORDER — METHYLPREDNISOLONE SODIUM SUCC 125 MG IJ SOLR
125.0000 mg | Freq: Once | INTRAMUSCULAR | Status: AC
  Administered 2019-03-16: 125 mg via INTRAVENOUS
  Filled 2019-03-16: qty 2

## 2019-03-16 MED FILL — predniSONE 10 MG TABS: 10 | 12 days supply | Qty: 45 | Fill #0

## 2019-03-16 NOTE — Telephone Encounter (Signed)
[place] if NP can see her.

## 2019-03-16 NOTE — Telephone Encounter (Signed)
I don't have a way to give her an injection unless she comes to see NP in office. She could go to Urgent Care She can use Mucinex, drink plenty of water, and stand in steamy shower to loosen mucus.

## 2019-03-16 NOTE — Discharge Instructions (Addendum)
Your lung aeration has improved significantly in the ER. We are sending you home with a longer prednisone taper at your request along with Mucinex prescription.  Use the peak flow machine as an indicator of worsening symptoms. Return to the ER if your symptoms get worse.

## 2019-03-16 NOTE — Telephone Encounter (Signed)
Call returned to patient, confirmed DOB, appt made. Nothing further needed at this time.

## 2019-03-16 NOTE — ED Notes (Signed)
Patient up to bedside commode with no assistance.

## 2019-03-16 NOTE — Telephone Encounter (Signed)
Call returned to patient, confirmed DOB, she states she is more SOB. She feels like she is having an asthma exacerbation. She is requesting a steroid injection. She is aware she cannot come into the office but states she is wants to get ahead of it before it gets bad. She repeatedly states she does not want to go back to the ED. I made her aware I would get the message to CY. Voiced understanding. Reports increased use of her albuterol inhaler. She reports she started using her nebulizer every 6 hours. She is using Symbicort daily. Reports she has a dry cough but it feels like there is something trying to break up in the back of her throat but it will not come up.   CY please advise Thanks.

## 2019-03-16 NOTE — Progress Notes (Signed)
Virtual Visit via Video Note  I connected with [name] on 03/16/19 at  2:30 PM EST by a video enabled telemedicine application and verified that I am speaking with the correct person using two identifiers.  Location: Patient: Home Provider: Home   I discussed the limitations of evaluation and management by telemedicine and the availability of in person appointments. The patient expressed understanding and agreed to proceed.  History of Present Illness: 53 year old female, never smoked. PMH significant for moderate persistent allergic asthma, HTN, OSA. Patient of Dr. Annamaria Boots, last seen on 03/04/19 for video visit. Maintained on Symbicort 160, Singulair, albuterol nebulizer.    03/16/2019 Patient reports shortness of breath, difficulty breathing, chest tightness and wheezing x 3-4 days. She tested positive for covid on 03/02/19. She still has a cough and cannot taste or smell. She had a fever of 101 two days ago, last took tylenol on Sunday 11/22. Feels her symptoms are related to her asthma. She does not want to be admitted to the hospital but she is ok with going to ED or UC for acute management of her asthma symptoms.   Observations/Objective:  - O2 97-98%; 92-94% RA today  Assessment and Plan:  Acute asthma exacerbation: - Rx prednisone (50mg  x 3 days; 40mg  x 3 days; 30mg  x 3 days; 20mg  x 3days; 10mg  x 3 days) - Continue Symbicort 160 twice daily; prn albuterol  - Advised ED for evaluation of acute asthma symptoms    Follow Up Instructions:   - 2-3 weeks with Dr. Annamaria Boots  I discussed the assessment and treatment plan with the patient. The patient was provided an opportunity to ask questions and all were answered. The patient agreed with the plan and demonstrated an understanding of the instructions.   The patient was advised to call back or seek an in-person evaluation if the symptoms worsen or if the condition fails to improve as anticipated.  I provided 15 minutes of  non-face-to-face time during this encounter.   Martyn Ehrich, NP
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
## 2019-03-16 NOTE — Telephone Encounter (Signed)
Dr. Annamaria Boots, I called the patient and gave her Dr. Janee Morn response. The patient stated she wanted to come in and be seen by NP so she can get the shot.  However, when I asked the covid screening questions, the patient stated she tested positive for covid on 03/02/19 and has not been retested at this time.  Still has cough, shortness of breath, cannot taste or smell. Still has nausea. Had a fever of 101 on Saturday, last took Tylenol on Sunday.  Please advise of recommendations, should she be directed to ER?

## 2019-03-16 NOTE — ED Triage Notes (Addendum)
Pt stated that she tested positive 03/03/19 for COVID. c/o asthma exacerbation. Pt has had fever at home, took tylenol before coming to ED.

## 2019-03-16 NOTE — ED Notes (Signed)
Peak flow 185 prior to inhaler treatments, 210 after treatment.

## 2019-03-16 NOTE — ED Notes (Signed)
Respiratory called for peak flow.

## 2019-03-17 MED FILL — traMADol HCL 50 MG TABS: 50 | 5 days supply | Qty: 30 | Fill #2

## 2019-03-17 NOTE — Addendum Note (Signed)
Addended by: Martyn Ehrich on: 03/17/2019 08:48 AM   Modules accepted: Level of Service

## 2019-03-23 MED FILL — CYCLOBENZAPRINE HCL 5 MG TA: 5 | 30 days supply | Qty: 90 | Fill #0

## 2019-03-24 ENCOUNTER — Ambulatory Visit: Payer: 59 | Admitting: Primary Care

## 2019-03-25 ENCOUNTER — Encounter: Payer: Self-pay | Admitting: Internal Medicine

## 2019-03-25 ENCOUNTER — Other Ambulatory Visit: Payer: Self-pay

## 2019-03-25 ENCOUNTER — Ambulatory Visit (INDEPENDENT_AMBULATORY_CARE_PROVIDER_SITE_OTHER): Payer: 59 | Admitting: Internal Medicine

## 2019-03-25 VITALS — BP 110/78 | HR 91 | Temp 97.1°F | Ht 63.0 in | Wt 252.2 lb

## 2019-03-25 DIAGNOSIS — J4541 Moderate persistent asthma with (acute) exacerbation: Secondary | ICD-10-CM

## 2019-03-25 DIAGNOSIS — O29099 Other pulmonary complications of anesthesia during pregnancy, unspecified trimester: Secondary | ICD-10-CM | POA: Diagnosis not present

## 2019-03-25 MED ORDER — METHYLPREDNISOLONE ACETATE 80 MG/ML IJ SUSP
80.0000 mg | Freq: Once | INTRAMUSCULAR | Status: AC
  Administered 2019-03-25: 80 mg via INTRAMUSCULAR

## 2019-03-25 NOTE — Patient Instructions (Addendum)
Order- depo 80     Dx Asthma exacerbation, Covid infection  Continue self-quarantine measures  Suggest adding otc Vitamin D3  1000 or 2000 units daily  Please call if we can help  Ok to use the Tussionex twice daily for cough  Continue your routine asthma meds  Ok to keep appointment in March unless you need [place] sooner.

## 2019-03-25 NOTE — Assessment & Plan Note (Addendum)
She feels she is dealing mainly with her asthma now. Hard to sort out what might be residual Covid symptoms.  She is feeling very stressed and I explained that stimulant effects of heavy med use may contribute to this. No evident CHF.  Oxygenating well on arrival and denies leg pain. Don't know if D-dimer would be interpretable. Plan- give the depomedrol inj she wants try. Doubt PE, but consider CTa if she fails to improve.

## 2019-03-25 NOTE — Assessment & Plan Note (Signed)
Lost about 20 lbs this summer, then partially regained. Still needs long-term emphasis on weight loss goal.

## 2019-03-25 NOTE — Progress Notes (Signed)
Subjective:    Patient ID: [name], female    DOB: 1965-08-15, 53 y.o.   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 FVC 2.39/87%, FEV1 2.04/89%, FEV1/FVC 2.04, FEF 25-75 percent 2.59/87%. Unattended Home Sleep Test 07/15/16-AHI 13.9/hour, desaturation to 74%, body weight 240 pounds Resp Allergy Profile 06/29/16- Pos dog and timothy grass, IgE 41 HST 10/07/17-AHI 6.9/hour, desaturation to 81%, body weight 249 pounds -----------------------------------------------------------------------.   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 Had been to someone returning from [place] who tested positive. Husband being tested now. Has been in touch with local healthcare advisors and may be able to enter a research protocol at [facility]. For now is self-quarantined. Cough, temp 103.8, taking  Tylenol. Feels she is controlled for now. 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 ED 11/20-  prednisone, ED 11/23- chest tight on prednisone- solumedrol,  LOV w NP 11/23 co SOB, chest tight, wheeze She asked for depomedrol inj now, for which we needed to bring her to office. Arrival RA sat 98%, Temp 97.1. Albuterol hfa, Symbicort 160, Dymista, Singulair, Sterapred taper Rx'd 11/23.  Now Blames her asthma for persistent dry cough, some dyspnea, fatigue, chest tight. No fever, purulent, blood or leg pain. Using all her meds, including rescue hfa 3-4 puffs at a time with limited benefit.  Admits "overwhelmed" by personal and family illnesses, slow course.  She asks to try depomedrol inj, not quite finished last pred taper.  Reluctant to consider hospital but she understands she should go if she gets worse.  CXR 03/13/2019-  IMPRESSION: No active disease.  ROS-see HPI    + = positive Constitutional:     weight loss, night sweats, fevers, chills, +fatigue, lassitude. HEENT:   No-  headaches, difficulty swallowing, tooth/dental problems, sore throat,       No-  sneezing, itching, ear ache, nasal congestion, post nasal drip,  CV:  No-   chest pain, orthopnea, PND, swelling in lower extremities, anasarca,  dizziness, palpitations Resp: + shortness of breath with exertion or at rest.                 productive cough, non-productive cough,  No- coughing up of blood.                  change in color of mucus.  wheezing.   Skin: No-   rash or lesions. GI:  No-   heartburn, indigestion, abdominal pain, nausea, vomiting,  GU:  MS:  No-   joint pain or swelling.   Neuro-     nothing unusual Psych:  No- change in mood or affect. No depression or anxiety.  No  memory loss.  OBJ- Physical Exam General- Alert, Oriented, Affect-appropriate, Distress- none acute,  + morbidly obese Skin- rash-none, lesions- none, excoriation- none Lymphadenopathy- none Head- atraumatic            Eyes- Gross vision intact, PERRLA, conjunctivae and secretions clear            Ears- Hearing, canals-normal             Nose- sniffing-none, no-Septal dev, mucus, polyps, erosion, perforation             Throat- Mallampati III , mucosa clear , drainage- none, tonsils- atrophic Neck- flexible , trachea midline, no stridor , thyroid nl, carotid no bruit Chest - symmetrical excursion , unlabored           Heart/CV- RRR , no murmur , no gallop  , no rub, nl s1 s2                           - JVD- none , edema- none, stasis changes- none, varices- none           Lung-   clear, wheeze -none, cough-none , dullness-none, rub- none           Chest wall-  Abd-  Br/ Gen/ Rectal- Not done, not indicated Extrem- cyanosis- none, clubbing, none, atrophy- none, strength- nl, cane Neuro- grossly intact to observation    Assessment & Plan:

## 2019-03-27 ENCOUNTER — Ambulatory Visit
Admission: RE | Admit: 2019-03-27 | Discharge: 2019-03-27 | Disposition: A | Discharge status: Home / Self Care | Payer: 59 | Source: Ambulatory Visit | Attending: Internal Medicine | Admitting: Internal Medicine

## 2019-03-27 ENCOUNTER — Other Ambulatory Visit: Payer: Self-pay | Admitting: Internal Medicine

## 2019-03-27 ENCOUNTER — Other Ambulatory Visit (INDEPENDENT_AMBULATORY_CARE_PROVIDER_SITE_OTHER): Payer: 59

## 2019-03-27 ENCOUNTER — Telehealth: Payer: Self-pay | Admitting: Internal Medicine

## 2019-03-27 DIAGNOSIS — R0989 Other specified symptoms and signs involving the circulatory and respiratory systems: Secondary | ICD-10-CM

## 2019-03-27 DIAGNOSIS — R7989 Other specified abnormal findings of blood chemistry: Secondary | ICD-10-CM | POA: Diagnosis not present

## 2019-03-27 DIAGNOSIS — J4541 Moderate persistent asthma with (acute) exacerbation: Secondary | ICD-10-CM

## 2019-03-27 LAB — BASIC METABOLIC PANEL
BUN: 16 mg/dL (ref 6–23)
CO2: 29 mEq/L (ref 19–32)
Calcium: 9.3 mg/dL (ref 8.4–10.5)
Chloride: 102 mEq/L (ref 96–112)
Creatinine, Ser: 1.14 mg/dL (ref 0.40–1.20)
GFR: 60.6 mL/min (ref >60.00)
Glucose, Bld: 117 mg/dL — ABNORMAL HIGH (ref 70–99)
Potassium: 3.6 mEq/L (ref 3.5–5.1)
Sodium: 139 mEq/L (ref 135–145)

## 2019-03-27 LAB — D-DIMER, QUANTITATIVE: D-Dimer, Quant: 0.52 mcg/mL FEU — ABNORMAL HIGH (ref <0.50)

## 2019-03-27 MED ORDER — IOPAMIDOL (ISOVUE-370) INJECTION 76%
75.0000 mL | Freq: Once | INTRAVENOUS | Status: AC | PRN
  Administered 2019-03-27: 75 mL via INTRAVENOUS

## 2019-03-27 NOTE — Telephone Encounter (Signed)
Labs have been drawn.   Will leave in triage and await f/u from CY once results have received.

## 2019-03-27 NOTE — Addendum Note (Signed)
Addended by: June Leap on: 03/27/2019 09:35 AM   Modules accepted: Orders

## 2019-03-27 NOTE — Telephone Encounter (Signed)
I called patient to check status. Persistent dry cough, a "little more energy", still easily DOE around home- mostly sedentary. Note recent mildly elevated D-dimer-nonspecific- and elevated creatinine.  Given association of clotting with Covid infection, I want to r/o PE. Plan- stat D-dimer and BMET. Then plan either CTa PE protocol, or VQ scan/dopplers. Verbal order for labs given to office nurse who will work with triage nurse.

## 2019-03-27 NOTE — Progress Notes (Signed)
Spoke with pt and notified of results per Dr. Young Pt verbalized understanding and denied any questions. 

## 2019-04-10 ENCOUNTER — Other Ambulatory Visit: Payer: Self-pay | Admitting: Internal Medicine

## 2019-04-10 MED FILL — traMADol HCL 50 MG TABS: 50 | 5 days supply | Qty: 30 | Fill #3

## 2019-04-10 MED FILL — IBUPROFEN 800 MG TAB: 800 | 30 days supply | Qty: 90 | Fill #1

## 2019-04-10 MED FILL — ALBUTEROL 0.083 MG/ML SOLN: (2.5 MG/3ML | 10 days supply | Qty: 90 | Fill #1

## 2019-04-13 ENCOUNTER — Telehealth: Payer: 59 | Admitting: Family

## 2019-04-13 ENCOUNTER — Telehealth: Payer: Self-pay | Admitting: Internal Medicine

## 2019-04-13 DIAGNOSIS — B002 Herpesviral gingivostomatitis and pharyngotonsillitis: Secondary | ICD-10-CM | POA: Diagnosis not present

## 2019-04-13 MED ORDER — HYDROCOD POLST-CPM POLST ER 10-8 MG/5ML PO SUER: 5.0000 mL | Freq: Two times a day (BID) | ORAL | 0 refills | Status: DC | PRN

## 2019-04-13 MED ORDER — VALACYCLOVIR HCL 1 G PO TABS: 2000.0000 mg | ORAL_TABLET | Freq: Two times a day (BID) | ORAL | 1 refills | Status: DC

## 2019-04-13 MED FILL — valACYclovir HCL 1 GM TABS: 1 | 1 days supply | Qty: 4 | Fill #0

## 2019-04-13 MED FILL — HYDROCODONE-CHLORPHEN ER SU: 10-8 | 12 days supply | Qty: 115 | Fill #0

## 2019-04-13 NOTE — Telephone Encounter (Signed)
Dr. Annamaria Boots,  The patient has been scheduled to see you Thursday 04/16/19 at 11:30.  She has requested a refill of Tussionex for the cough to be used until she can be seen by you.  Patient last visit was seen by you on 03/25/19. 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 ED 11/20- prednisone, ED 11/23- chest tight on prednisone- solumedrol,  LOV w NP 11/23 co SOB, chest tight, wheeze She asked for depomedrol inj now, for which we needed to bring her to office. Arrival RA sat 98%, Temp 97.1. Albuterol hfa, Symbicort 160, Dymista, Singulair, Sterapred taper Rx'd 11/23.  Now Blames her asthma for persistent dry cough, some dyspnea, fatigue, chest tight. No fever, purulent, blood or leg pain. Using all her meds, including rescue hfa 3-4 puffs at a time with limited benefit.  Admits "overwhelmed" by personal and family illnesses, slow course.  She asks to try depomedrol inj, not quite finished last pred taper.  Reluctant to consider hospital but she understands she should go if she gets worse.  CXR 03/13/2019-  IMPRESSION: No active disease.  ROS-see HPI    + = positive Constitutional:     weight loss, night sweats, fevers, chills, +fatigue, lassitude. HEENT:   No-  headaches, difficulty swallowing, tooth/dental problems, sore throat,                  No-  sneezing, itching, ear ache, nasal congestion, post nasal drip,  CV:  No-   chest pain, orthopnea, PND, swelling in lower extremities, anasarca,  dizziness, palpitations Resp: + shortness of breath with exertion or at rest.                 productive cough, non-productive cough,  No- coughing up of blood.                  change in color of mucus.   wheezing.   Skin: No-   rash or lesions. GI:  No-   heartburn, indigestion, abdominal pain, nausea, vomiting,  GU:  MS:  No-   joint pain or swelling.   Neuro-     nothing unusual Psych:  No- change in mood or affect. No depression or anxiety.  No memory loss.  OBJ- Physical Exam General- Alert, Oriented, Affect-appropriate, Distress- none acute,  + morbidly obese Skin- rash-none, lesions- none, excoriation- none Lymphadenopathy- none Head- atraumatic            Eyes- Gross vision intact, PERRLA, conjunctivae and secretions clear            Ears- Hearing, canals-normal            Nose- sniffing-none, no-Septal dev, mucus, polyps, erosion, perforation             Throat- Mallampati III , mucosa clear , drainage- none, tonsils- atrophic Neck- flexible , trachea midline, no stridor , thyroid nl, carotid no bruit Chest - symmetrical excursion , unlabored           Heart/CV- RRR , no murmur , no gallop  , no rub, nl s1 s2                           - JVD- none ,  edema- none, stasis changes- none, varices- none           Lung-   clear, wheeze -none, cough-none , dullness-none, rub- none           Chest wall-  Abd-  Br/ Gen/ Rectal- Not done, not indicated Extrem- cyanosis- none, clubbing, none, atrophy- none, strength- nl, cane Neuro- grossly intact to observation    Assessment & Plan   She feels she is dealing mainly with her asthma now. Hard to sort out what might be residual Covid symptoms.  She is feeling very stressed and I explained that stimulant effects of heavy med use may contribute to this. No evident CHF.  Oxygenating well on arrival and denies leg pain. Don't know if D-dimer would be interpretable. Plan- give the depomedrol inj she wants try. Doubt PE, but consider CTa if she fails to improve. Lost about 20 lbs this summer, then partially regained. Still needs long-term emphasis on weight loss goal. Order- depo 80     Dx Asthma exacerbation, Covid infection  Continue  self-quarantine measures  Suggest adding otc Vitamin D3  1000 or 2000 units daily  Please call if we can help  Ok to use the Tussionex twice daily for cough"

## 2019-04-13 NOTE — Telephone Encounter (Signed)
I see a comment from [place] that tussionex was "already sent to pharmacy". Last refill for this that I know of was in November. Does she need another refill of tussionex now?

## 2019-04-13 NOTE — Telephone Encounter (Signed)
Dr. Annamaria Boots,  Per conversation with the patient, yes she was asking for a refill.

## 2019-04-13 NOTE — Progress Notes (Signed)
We are sorry that you are not feeling well.  Here is how we plan to help!  Based on what you have shared with me it does look like you have a viral infection.    Most cold sores or fever blisters are small fluid filled blisters around the mouth caused by herpes simplex virus.  The most common strain of the virus causing cold sores is herpes simplex virus 1.  It can be spread by skin contact, sharing eating utensils, or even sharing towels.  Cold sores are contagious to other people until dry. (Approximately 5-7 days).  Wash your hands. You can spread the virus to your eyes through handling your contact lenses after touching the lesions.  Most people experience pain at the sight or tingling sensations in their lips that may begin before the ulcers erupt.  Herpes simplex is treatable but not curable.  It may lie dormant for a long time and then reappear due to stress or prolonged sun exposure.  Many patients have success in treating their cold sores with an over the counter topical called Abreva.  You may apply the cream up to 5 times daily (maximum 10 days) until healing occurs.  If you would like to use an oral antiviral medication to speed the healing of your cold sore, I have sent a prescription to your local pharmacy Valacyclovir 2 gm take one by mouth twice a day for 1 day    HOME CARE:   Wash your hands frequently.  Do not pick at or rub the sore.  Don't open the blisters.  Avoid kissing other people during this time.  Avoid sharing drinking glasses, eating utensils, or razors.  Do not handle contact lenses unless you have thoroughly washed your hands with soap and warm water!  Avoid oral sex during this time.  Herpes from sores on your mouth can spread to your partner's genital area.  Avoid contact with anyone who has eczema or a weakened immune system.  Cold sores are often triggered by exposure to intense sunlight, use a lip balm containing a sunscreen (SPF 30 or  higher).  GET HELP RIGHT AWAY IF:   Blisters look infected.  Blisters occur near or in the eye.  Symptoms last longer than 10 days.  Your symptoms become worse.  MAKE SURE YOU:   Understand these instructions.  Will watch your condition.  Will get help right away if you are not doing well or get worse.    Your e-visit answers were reviewed by a board certified advanced clinical practitioner to complete your personal care plan.  Depending upon the condition, your plan could have  Included both over the counter or prescription medications.    Please review your pharmacy choice.  Be sure that the pharmacy you have chosen is open so that you can pick up your prescription now.  If there is a problem you can message your provider in MyChart to have the prescription routed to another pharmacy.    Your safety is important to us.  If you have drug allergies check our prescription carefully.  For the next 24 hours you can use MyChart to ask questions about today's visit, request a non-urgent call back, or ask for a work or school excuse from your e-visit provider.  You will get an email in the next two days asking about your experience.  I hope that your e-visit has been valuable and will speed your recovery.   Greater than 5 minutes, yet less   10 minutes of time have been spent researching, coordinating, and implementing care for this patient today.  Thank you for the details you included in the comment boxes. Those details are very helpful in determining the best course of treatment for you and help us to provide the best care.  

## 2019-04-13 NOTE — Telephone Encounter (Signed)
Dr. Annamaria Boots, please advise if it is okay to work pt in to your schedule this week as she is no better?

## 2019-04-13 NOTE — Telephone Encounter (Signed)
ATC Patient.  Patient did not answer and VM is full.  Will try again at a later time.

## 2019-04-13 NOTE — Telephone Encounter (Signed)
Ok to use a held spot  

## 2019-04-13 NOTE — Telephone Encounter (Signed)
Tussionex refill e-sent 

## 2019-04-13 NOTE — Telephone Encounter (Signed)
ATC Patient x's 2.  No answer, and unable to leave message, VM is full.

## 2019-04-14 NOTE — Telephone Encounter (Signed)
Pharmacy sent refill request. Medication was refilled on 04/14/2019. Unable to deny refill request due to controlled substance.  Dr. Annamaria Boots please advise

## 2019-04-16 ENCOUNTER — Ambulatory Visit (INDEPENDENT_AMBULATORY_CARE_PROVIDER_SITE_OTHER): Payer: 59 | Admitting: Internal Medicine

## 2019-04-16 ENCOUNTER — Encounter: Payer: Self-pay | Admitting: Internal Medicine

## 2019-04-16 ENCOUNTER — Other Ambulatory Visit: Payer: Self-pay

## 2019-04-16 VITALS — BP 132/84 | HR 78 | Temp 97.3°F | Ht 63.0 in

## 2019-04-16 DIAGNOSIS — J849 Interstitial pulmonary disease, unspecified: Secondary | ICD-10-CM

## 2019-04-16 DIAGNOSIS — J80 Acute respiratory distress syndrome: Secondary | ICD-10-CM | POA: Diagnosis not present

## 2019-04-16 DIAGNOSIS — J4541 Moderate persistent asthma with (acute) exacerbation: Secondary | ICD-10-CM | POA: Diagnosis not present

## 2019-04-16 DIAGNOSIS — U071 COVID-19: Secondary | ICD-10-CM

## 2019-04-16 MED ORDER — METHYLPREDNISOLONE ACETATE 80 MG/ML IJ SUSP
80.0000 mg | Freq: Once | INTRAMUSCULAR | Status: AC
  Administered 2019-04-16: 80 mg via INTRAMUSCULAR

## 2019-04-16 NOTE — Patient Instructions (Addendum)
Order- depo 80     Dx Covid syndrome  Order- schedule future in 6 weeks CT chest High Resolution   Post Covid ILD  Ok to call for more tussionex when needed  Ok to keep March 16 appointment for now

## 2019-04-16 NOTE — Progress Notes (Signed)
Subjective:    Patient ID: [name], female    DOB: 11-11-1965, 53 y.o.   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 FVC 2.39/87%, FEV1 2.04/89%, FEV1/FVC 2.04, FEF 25-75 percent 2.59/87%. Unattended Home Sleep Test 07/15/16-AHI 13.9/hour, desaturation to 74%, body weight 240 pounds Resp Allergy Profile 06/29/16- Pos dog and timothy grass, IgE 41 HST 10/07/17-[facility]I 6.9/hour, desaturation to 81%, body weight 249 pounds -----------------------------------------------------------------------.    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 ED 11/20- prednisone, ED 11/23- chest tight on prednisone- solumedrol,  LOV w NP 11/23 co SOB, chest tight, wheeze She asked for depomedrol inj now, for which we needed to bring her to office. Arrival RA sat 98%, Temp 97.1. Albuterol hfa, Symbicort 160, Dymista, Singulair, Sterapred taper Rx'd 11/23.  Now Blames her asthma for persistent dry cough, some dyspnea, fatigue, chest tight. No fever, purulent, blood or leg pain. Using all her meds, including rescue hfa 3-4 puffs at a time with limited benefit.  Admits "overwhelmed" by personal and family illnesses, slow course.  She asks to try depomedrol inj, not quite finished last pred taper.  Reluctant to consider hospital but she understands she should go if she gets worse.  CXR 03/13/2019-  IMPRESSION: No active disease.  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 ED 11/20- prednisone, ED 11/23- chest tight on prednisone- solumedrol, Depo 80 at [place] 12/2.  Neb albut, Symbicort 160, Singulair, Ventolin hfa Still no smell or taste, easy fatigue and dyspnea on stairs. Hard dry cough gives vertex HA. Tussionex helps this but Delsym does not.  Asks for depo inj, hoping it will "pick me up"- discussed. CTa chest 03/27/2019-  Lungs/Pleura: Central airways are patent. Linear scarring or atelectasis in the right lower lobe. No pleural effusion or pneumothorax No evidence of acute pulmonary embolism.   ROS-see HPI    + = positive Constitutional:     weight loss, night sweats, fevers, chills, +fatigue, lassitude. HEENT:   +headaches, difficulty swallowing, tooth/dental problems, sore throat,       No-  sneezing, itching, ear ache, nasal congestion, post nasal drip,  CV:  No-   chest pain, orthopnea, PND, swelling in lower extremities, anasarca,  dizziness, palpitations Resp: + shortness of breath with exertion or at rest.                 productive cough, non-productive cough,  No- coughing up of blood.                  change in color of mucus.  wheezing.   Skin: No-   rash or lesions. GI:  No-   heartburn, indigestion, abdominal pain, nausea, vomiting,  GU:  MS:  No-   joint pain or swelling.   Neuro-     nothing unusual Psych:  No- change in mood or affect. No depression or anxiety.  No memory loss.  OBJ- Physical Exam General- Alert, Oriented, Affect-appropriate, Distress- none acute,  + morbidly obese Skin- rash-none, lesions- none, excoriation- none Lymphadenopathy- none Head- atraumatic            Eyes- Gross vision intact, PERRLA, conjunctivae and secretions clear            Ears- Hearing, canals-normal            Nose- sniffing-none, no-Septal dev, mucus,  polyps, erosion, perforation             Throat- Mallampati III , mucosa clear , drainage- none, tonsils- atrophic Neck- flexible , trachea midline, no stridor , thyroid nl, carotid no bruit Chest - symmetrical excursion , unlabored           Heart/CV- RRR , no murmur , no gallop  , no rub, nl s1 s2                           - JVD- none , edema- none, stasis changes- none, varices- none           Lung-   clear, wheeze -none, cough-none , dullness-none, rub- none           Chest wall-  Abd-  Br/ Gen/ Rectal- Not done, not indicated Extrem- cyanosis- none, clubbing, none, atrophy- none, strength- nl, cane Neuro- grossly intact to observation    Assessment & Plan:

## 2019-04-20 NOTE — Assessment & Plan Note (Signed)
Not clear if any of her cough and dyspnea relates to asthma. She continues her regular meds. Depomedrol given today with discussion.

## 2019-04-20 NOTE — Assessment & Plan Note (Signed)
Sustained weight loss effort seems out of reach for now, but is encouraged.

## 2019-04-20 NOTE — Assessment & Plan Note (Signed)
"  Long haul" pattern may be developing, with sustained loss of taste and smell, persistent dyspnea and fatigue. Plan- depo 80 as requested, after discussion. Encourage regular walking. We will get an HR CT in 6 weeks, looking for developing post-covid fibrosis.

## 2019-05-06 DIAGNOSIS — I10 Essential (primary) hypertension: Secondary | ICD-10-CM | POA: Diagnosis not present

## 2019-05-06 DIAGNOSIS — K219 Gastro-esophageal reflux disease without esophagitis: Secondary | ICD-10-CM | POA: Diagnosis not present

## 2019-05-06 DIAGNOSIS — R002 Palpitations: Secondary | ICD-10-CM | POA: Diagnosis not present

## 2019-05-06 DIAGNOSIS — J4540 Moderate persistent asthma, uncomplicated: Secondary | ICD-10-CM | POA: Diagnosis not present

## 2019-05-06 DIAGNOSIS — G4733 Obstructive sleep apnea (adult) (pediatric): Secondary | ICD-10-CM | POA: Diagnosis not present

## 2019-05-06 DIAGNOSIS — J302 Other seasonal allergic rhinitis: Secondary | ICD-10-CM | POA: Diagnosis not present

## 2019-05-06 DIAGNOSIS — R7303 Prediabetes: Secondary | ICD-10-CM | POA: Diagnosis not present

## 2019-05-06 DIAGNOSIS — E782 Mixed hyperlipidemia: Secondary | ICD-10-CM | POA: Diagnosis not present

## 2019-05-06 MED FILL — METOPROLOL SUCCINATE ER 25: 25 | 30 days supply | Qty: 30 | Fill #0

## 2019-05-06 MED FILL — SYMBICORT 160-4.5 MCG INH: 160-4.5 | 30 days supply | Qty: 10 | Fill #0

## 2019-05-06 MED FILL — FUROSEMIDE 40 MG TAB: 40 | 90 days supply | Qty: 90 | Fill #0

## 2019-05-06 MED FILL — AMLODIPINE BESYLATE 10 MG T: 10 | 30 days supply | Qty: 30 | Fill #0

## 2019-05-12 MED FILL — traMADol HCL 50 MG TABS: 50 | 5 days supply | Qty: 30 | Fill #4

## 2019-05-29 ENCOUNTER — Other Ambulatory Visit: Payer: Self-pay

## 2019-05-29 ENCOUNTER — Ambulatory Visit
Admission: RE | Admit: 2019-05-29 | Discharge: 2019-05-29 | Disposition: A | Discharge status: Home / Self Care | Payer: 59 | Source: Ambulatory Visit | Attending: Internal Medicine | Admitting: Internal Medicine

## 2019-05-29 DIAGNOSIS — U071 COVID-19: Secondary | ICD-10-CM

## 2019-05-29 DIAGNOSIS — R918 Other nonspecific abnormal finding of lung field: Secondary | ICD-10-CM

## 2019-05-29 DIAGNOSIS — J849 Interstitial pulmonary disease, unspecified: Secondary | ICD-10-CM

## 2019-06-03 ENCOUNTER — Ambulatory Visit (INDEPENDENT_AMBULATORY_CARE_PROVIDER_SITE_OTHER): Payer: 59 | Admitting: Cardiology

## 2019-06-03 ENCOUNTER — Other Ambulatory Visit: Payer: Self-pay

## 2019-06-03 ENCOUNTER — Encounter: Payer: Self-pay | Admitting: Cardiology

## 2019-06-03 VITALS — BP 169/116 | HR 74 | Temp 98.0°F | Ht 63.0 in | Wt 258.2 lb

## 2019-06-03 DIAGNOSIS — I10 Essential (primary) hypertension: Secondary | ICD-10-CM

## 2019-06-03 DIAGNOSIS — R002 Palpitations: Secondary | ICD-10-CM | POA: Diagnosis not present

## 2019-06-03 DIAGNOSIS — J4540 Moderate persistent asthma, uncomplicated: Secondary | ICD-10-CM

## 2019-06-03 DIAGNOSIS — R0609 Other forms of dyspnea: Secondary | ICD-10-CM | POA: Diagnosis not present

## 2019-06-03 DIAGNOSIS — Z6841 Body Mass Index (BMI) 40.0 and over, adult: Secondary | ICD-10-CM

## 2019-06-03 DIAGNOSIS — R0600 Dyspnea, unspecified: Secondary | ICD-10-CM

## 2019-06-03 DIAGNOSIS — E66813 Obesity, class 3: Secondary | ICD-10-CM
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
## 2019-06-09 ENCOUNTER — Ambulatory Visit: Payer: 59

## 2019-06-09 ENCOUNTER — Other Ambulatory Visit: Payer: Self-pay

## 2019-06-09 DIAGNOSIS — R0600 Dyspnea, unspecified: Secondary | ICD-10-CM

## 2019-06-09 DIAGNOSIS — R0609 Other forms of dyspnea: Secondary | ICD-10-CM

## 2019-06-09 DIAGNOSIS — I10 Essential (primary) hypertension: Secondary | ICD-10-CM

## 2019-06-13 NOTE — Progress Notes (Signed)
Normal LVEF. Mod LVH.

## 2019-06-16 NOTE — Telephone Encounter (Signed)
From pt

## 2019-07-02 MED FILL — traMADol HCL 50 MG TABS: 50 | 5 days supply | Qty: 30 | Fill #5

## 2019-07-07 ENCOUNTER — Ambulatory Visit (INDEPENDENT_AMBULATORY_CARE_PROVIDER_SITE_OTHER): Payer: 59 | Admitting: Internal Medicine

## 2019-07-07 ENCOUNTER — Encounter: Payer: Self-pay | Admitting: Internal Medicine

## 2019-07-07 ENCOUNTER — Other Ambulatory Visit: Payer: Self-pay

## 2019-07-07 VITALS — BP 142/84 | HR 54 | Temp 97.5°F | Ht 63.0 in

## 2019-07-07 DIAGNOSIS — J4520 Mild intermittent asthma, uncomplicated: Secondary | ICD-10-CM

## 2019-07-07 DIAGNOSIS — U071 COVID-19: Secondary | ICD-10-CM

## 2019-07-07 DIAGNOSIS — R918 Other nonspecific abnormal finding of lung field: Secondary | ICD-10-CM | POA: Diagnosis not present

## 2019-07-07 NOTE — Assessment & Plan Note (Signed)
Discussed CT with her.  Plan- repeat CT in 4 months

## 2019-07-07 NOTE — Assessment & Plan Note (Signed)
Post covid pneumonia  Nov, 2020. Gradually improving strength and feeling much more optimistic. Back at work.

## 2019-07-07 NOTE — Assessment & Plan Note (Signed)
Controlled on current meds Plan- watch as pollen season comes in, with no med change now.

## 2019-07-07 NOTE — Patient Instructions (Signed)
Check price for generic Symbicort. If still too expensive, see if your insurance prefers something similar, like Dulera or Breo.  Order- schedule future HRCT chest, no contrast  Dx multiple lung nodules  In 4 months  Please call if we can help

## 2019-07-07 NOTE — Progress Notes (Signed)
Subjective:    Patient ID: [name], female    DOB: [birth year], 54 y.o.   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 FVC 2.39/87%, FEV1 2.04/89%, FEV1/FVC 2.04, FEF 25-75 percent 2.59/87%. Unattended Home Sleep Test 07/15/16-AHI 13.9/hour, desaturation to 74%, body weight 240 pounds Resp Allergy Profile 06/29/16- Pos dog and timothy grass, IgE 41 HST 10/07/17-AHI 6.9/hour, desaturation to 81%, body weight 249 pounds -----------------------------------------------------------------------.   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 ED 11/20- prednisone, ED 11/23- chest tight on prednisone- solumedrol, Depo 80 at [place] 12/2.  Neb albut, Symbicort 160, Singulair, Ventolin hfa Still no smell or taste, easy fatigue and dyspnea on stairs. Hard dry cough gives vertex HA. Tussionex helps this but Delsym does not.  Asks for depo inj, hoping it will "pick me up"- discussed. CTa chest 03/27/2019-  Lungs/Pleura: Central airways are patent. Linear scarring or atelectasis in the right lower lobe. No pleural effusion or pneumothorax No evidence of acute pulmonary embolism.  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 Neb albut, Symbicort 160, Singulair,  Ventolin hfa -----f/u post COVID/allergic asthma. Breathing is a patient's baseline.  Says she lost 40 lbs from covid, but declines to be weighed Still tired but feeling very much better. Some twinges of sternal pain she identifiess as "musculoskeletal". Palpitation evaluated by cardiology.  Discussed lung nodules on CT, for f/u CT in 4 months.   HRCT chest 05/29/19-  IMPRESSION: 1. No evidence of interstitial lung disease. 2. Three scattered solid pulmonary nodules, largest 1.1 cm in the left upper lobe. Non-contrast chest CT at 3-6 months is recommended. If the nodules are stable at time of repeat CT, then future CT at 18-24 months (from today's scan) is considered optional for low-risk patients, but is recommended for high-risk patients. This recommendation follows the consensus statement: Guidelines for Management of Incidental Pulmonary Nodules Detected on CT Images: From the Fleischner Society 2017; Radiology 2017; 284:228-243.    ROS-see HPI    + = positive Constitutional:     weight loss, night sweats, fevers, chills, +fatigue, lassitude. HEENT:   +headaches, difficulty swallowing, tooth/dental problems, sore throat,       No-  sneezing, itching, ear ache, nasal congestion, post nasal drip,  CV:    chest pain, orthopnea, PND, swelling in lower extremities, anasarca,  dizziness, +palpitations Resp: + shortness of breath with exertion or at rest.                 productive cough, non-productive cough,  No- coughing up of blood.                  change in color of mucus.  wheezing.   Skin: No-   rash or lesions. GI:  No-   heartburn, indigestion, abdominal pain, nausea, vomiting,  GU:  MS:  No-   joint pain or swelling.   Neuro-  nothing unusual Psych:  No- change in mood or affect. No depression or anxiety.  No memory loss.  OBJ- Physical Exam General- Alert, Oriented, Affect-appropriate, Distress- none acute,  + morbidly obese Skin- rash-none, lesions- none, excoriation-  none Lymphadenopathy- none Head- atraumatic            Eyes- Gross vision intact, PERRLA, conjunctivae and secretions clear            Ears- Hearing, canals-normal            Nose- sniffing-none, no-Septal dev, mucus, polyps, erosion, perforation             Throat- Mallampati III , mucosa clear , drainage- none, tonsils- atrophic Neck- flexible , trachea midline, no stridor , thyroid nl, carotid no bruit Chest - symmetrical excursion , unlabored           Heart/CV- RRR , no murmur , no gallop  , no rub, nl s1 s2                           - JVD- none , edema- none, stasis changes- none, varices- none           Lung-   clear, wheeze -none, cough+slight dry , dullness-none, rub- none           Chest wall-  Abd-  Br/ Gen/ Rectal- Not done, not indicated Extrem- cyanosis- none, clubbing, none, atrophy- none, strength- nl, cane Neuro- grossly intact to observation    Assessment & Plan:

## 2019-07-20 ENCOUNTER — Other Ambulatory Visit: Payer: Self-pay | Admitting: Internal Medicine

## 2019-07-20 MED FILL — AMLODIPINE BESYLATE 10 MG T: 10 | 30 days supply | Qty: 30 | Fill #1

## 2019-07-21 NOTE — Telephone Encounter (Signed)
Tramadol refill e-sent 

## 2019-07-21 NOTE — Telephone Encounter (Signed)
Dr. Annamaria Boots, please advise if you are okay refilling med.   Allergies  Allergen Reactions  . Influenza A (H1n1) Monoval Vac     Reaction: systemic reaction  . Omalizumab     Arvid Right* REACTION: angioedema-looses airway  . Promethazine Hcl     REACTION: hallucinations  . Topiramate      Current Outpatient Medications:  .  albuterol (PROVENTIL) (2.5 MG/3ML) 0.083% nebulizer solution, USE 1 VIAL VIA NEBULIZER EVERY 6 HOURS AS NEEDED FOR WHEEZING OR SHORTNESS OF BREATH (Patient taking differently: Take 2.5 mg by nebulization every 6 (six) hours as needed for wheezing or shortness of breath. ), Disp: 90 mL, Rfl: 12 .  amLODipine (NORVASC) 10 MG tablet, Take 10 mg by mouth daily., Disp: , Rfl:  .  Ascorbic Acid (VITAMIN C) 500 MG CAPS, Take by mouth daily., Disp: , Rfl:  .  Azelastine-Fluticasone (DYMISTA) 137-50 MCG/ACT SUSP, One puff twice daily ea nostril (Patient taking differently: Place 1 puff into both nostrils 2 (two) times daily. ), Disp: 23 g, Rfl: 5 .  benzonatate (TESSALON) 200 MG capsule, Take 1 capsule (200 mg total) by mouth 3 (three) times daily as needed for cough., Disp: 30 capsule, Rfl: 1 .  budesonide-formoterol (SYMBICORT) 160-4.5 MCG/ACT inhaler, Inhale 2 puffs into the lungs 2 (two) times daily. Rinse mouth, Disp: 1 Inhaler, Rfl: 12 .  chlorpheniramine-HYDROcodone (TUSSIONEX) 10-8 MG/5ML SUER, Take 5 mLs by mouth every 12 (twelve) hours as needed for cough., Disp: 115 mL, Rfl: 0 .  Cholecalciferol (VITAMIN D-3) 125 MCG (5000 UT) TABS, Take by mouth daily., Disp: , Rfl:  .  cyclobenzaprine (FLEXERIL) 5 MG tablet, Take 1 tablet (5 mg total) by mouth 3 (three) times daily as needed for muscle spasms (or migraines)., Disp: 50 tablet, Rfl: 0 .  dextromethorphan-guaiFENesin (MUCINEX DM) 30-600 MG 12hr tablet, Take 1 tablet by mouth 2 (two) times daily. (Patient taking differently: Take 1 tablet by mouth as needed. ), Disp: 10 tablet, Rfl: 0 .  econazole nitrate 1 % cream, Apply 1  application topically daily as needed. , Disp: , Rfl: 2 .  EPINEPHRINE 0.3 mg/0.3 mL IJ SOAJ injection, INJECT INTO THIGH FOR SEVERE ASTHMA/ ALLERGIC REACTION, Disp: 2 each, Rfl: 12 .  furosemide (LASIX) 40 MG tablet, TAKE 1 TABLET BY MOUTH ONCE DAILY AS NEEDED (Patient taking differently: Take 40 mg by mouth daily as needed for fluid. ), Disp: 30 tablet, Rfl: 5 .  ibuprofen (ADVIL,MOTRIN) 800 MG tablet, Take 1 tablet by mouth daily as needed., Disp: , Rfl: 0 .  levocetirizine (XYZAL) 5 MG tablet, Take 1 tablet (5 mg total) by mouth every evening., Disp: 30 tablet, Rfl: 5 .  loratadine (CLARITIN) 10 MG tablet, Take 1 tablet (10 mg total) by mouth daily., Disp: , Rfl:  .  LORazepam (ATIVAN) 1 MG tablet, 1 every 8 hours if needed for anxiety (Patient taking differently: Take 1 mg by mouth every 8 (eight) hours as needed for anxiety. ), Disp: 30 tablet, Rfl: 3 .  metoprolol succinate (TOPROL-XL) 25 MG 24 hr tablet, Take 25 mg by mouth daily., Disp: , Rfl:  .  montelukast (SINGULAIR) 10 MG tablet, Take 1 tablet (10 mg total) by mouth at bedtime., Disp: 30 tablet, Rfl: 11 .  olopatadine (PATANOL) 0.1 % ophthalmic solution, INSTILL 1 DROP INTO BOTH EYES TWO TIMES DAILY AS NEEDED FOR ALLERGIES (Patient taking differently: Place 1 drop into both eyes 2 (two) times daily as needed for allergies. ), Disp: 5 mL, Rfl:  5 .  Spacer/Aero-Holding Chambers (AEROCHAMBER MV) inhaler, Use as instructed, Disp: 1 each, Rfl: 0 .  traMADol (ULTRAM) 50 MG tablet, Take 1 tablet (50 mg total) by mouth every 4 (four) hours as needed. for pain, Disp: 30 tablet, Rfl: 5 .  TRANSDERM-SCOP, 1.5 MG, 1 MG/3DAYS, APPLY 1 PATCH ONTO THE SKIN EVERY 3 DAYS. (Patient taking differently: Place 1 patch onto the skin every 3 (three) days. ), Disp: 10 patch, Rfl: 12 .  urea (CARMOL) 40 % CREA, Apply 1 application topically daily as needed. , Disp: , Rfl: 2 .  VENTOLIN HFA 108 (90 Base) MCG/ACT inhaler, INHALE 2 PUFFS BY MOUTH INTO THE LUNGS  EVERY 6 HOURS AS NEEDED FOR WHEEZING (Patient taking differently: Inhale 2 puffs into the lungs every 6 (six) hours as needed for wheezing or shortness of breath. ), Disp: 18 g, Rfl: 12 .  vitamin E 1000 UNIT capsule, Take 1,000 Units by mouth daily., Disp: , Rfl:  .  XOPENEX 1.25 MG/0.5ML nebulizer solution, , Disp: , Rfl:  .  zinc gluconate 50 MG tablet, Take 50 mg by mouth at bedtime., Disp: , Rfl:

## 2019-07-22 MED FILL — traMADol HCL 50 MG TABS: 50 | 5 days supply | Qty: 30 | Fill #0

## 2019-09-02 ENCOUNTER — Ambulatory Visit: Payer: 59 | Admitting: Internal Medicine

## 2019-09-04 ENCOUNTER — Other Ambulatory Visit (HOSPITAL_COMMUNITY): Payer: Self-pay | Admitting: Internal Medicine

## 2019-09-04 DIAGNOSIS — Z0001 Encounter for general adult medical examination with abnormal findings: Secondary | ICD-10-CM | POA: Diagnosis not present

## 2019-09-04 DIAGNOSIS — G4733 Obstructive sleep apnea (adult) (pediatric): Secondary | ICD-10-CM | POA: Diagnosis not present

## 2019-09-04 DIAGNOSIS — E782 Mixed hyperlipidemia: Secondary | ICD-10-CM | POA: Diagnosis not present

## 2019-09-04 DIAGNOSIS — I10 Essential (primary) hypertension: Secondary | ICD-10-CM | POA: Diagnosis not present

## 2019-09-04 DIAGNOSIS — K219 Gastro-esophageal reflux disease without esophagitis: Secondary | ICD-10-CM | POA: Diagnosis not present

## 2019-09-04 DIAGNOSIS — R7303 Prediabetes: Secondary | ICD-10-CM | POA: Diagnosis not present

## 2019-09-04 DIAGNOSIS — J302 Other seasonal allergic rhinitis: Secondary | ICD-10-CM | POA: Diagnosis not present

## 2019-09-04 DIAGNOSIS — J4540 Moderate persistent asthma, uncomplicated: Secondary | ICD-10-CM | POA: Diagnosis not present

## 2019-09-08 MED FILL — CYCLOBENZAPRINE HCL 5 MG TA: 5 | 30 days supply | Qty: 90 | Fill #1

## 2019-09-08 MED FILL — BENZONATATE 200 MG CAP: 200 | 10 days supply | Qty: 30 | Fill #1

## 2019-09-08 MED FILL — IBUPROFEN 800 MG TAB: 800 | 30 days supply | Qty: 90 | Fill #0

## 2019-09-28 DIAGNOSIS — Z01419 Encounter for gynecological examination (general) (routine) without abnormal findings: Secondary | ICD-10-CM | POA: Diagnosis not present

## 2019-09-28 DIAGNOSIS — Z1231 Encounter for screening mammogram for malignant neoplasm of breast: Secondary | ICD-10-CM | POA: Diagnosis not present

## 2019-09-28 DIAGNOSIS — Z6841 Body Mass Index (BMI) 40.0 and over, adult: Secondary | ICD-10-CM | POA: Diagnosis not present

## 2019-09-28 DIAGNOSIS — Z1239 Encounter for other screening for malignant neoplasm of breast: Secondary | ICD-10-CM | POA: Diagnosis not present

## 2019-09-28 DIAGNOSIS — N898 Other specified noninflammatory disorders of vagina: Secondary | ICD-10-CM | POA: Diagnosis not present

## 2019-10-12 ENCOUNTER — Other Ambulatory Visit (HOSPITAL_COMMUNITY): Payer: Self-pay | Admitting: Internal Medicine

## 2019-10-12 DIAGNOSIS — B948 Sequelae of other specified infectious and parasitic diseases: Secondary | ICD-10-CM | POA: Diagnosis not present

## 2019-10-12 DIAGNOSIS — G4733 Obstructive sleep apnea (adult) (pediatric): Secondary | ICD-10-CM | POA: Diagnosis not present

## 2019-10-12 DIAGNOSIS — J4540 Moderate persistent asthma, uncomplicated: Secondary | ICD-10-CM | POA: Diagnosis not present

## 2019-10-12 DIAGNOSIS — K219 Gastro-esophageal reflux disease without esophagitis: Secondary | ICD-10-CM | POA: Diagnosis not present

## 2019-10-12 DIAGNOSIS — E782 Mixed hyperlipidemia: Secondary | ICD-10-CM | POA: Diagnosis not present

## 2019-10-12 DIAGNOSIS — R7303 Prediabetes: Secondary | ICD-10-CM | POA: Diagnosis not present

## 2019-10-12 DIAGNOSIS — F418 Other specified anxiety disorders: Secondary | ICD-10-CM | POA: Diagnosis not present

## 2019-10-12 DIAGNOSIS — I10 Essential (primary) hypertension: Secondary | ICD-10-CM | POA: Diagnosis not present

## 2019-10-28 ENCOUNTER — Ambulatory Visit
Admission: RE | Admit: 2019-10-28 | Discharge: 2019-10-28 | Disposition: A | Discharge status: Home / Self Care | Payer: 59 | Source: Ambulatory Visit | Attending: Internal Medicine | Admitting: Internal Medicine

## 2019-10-28 ENCOUNTER — Other Ambulatory Visit: Payer: Self-pay

## 2019-10-28 DIAGNOSIS — R918 Other nonspecific abnormal finding of lung field: Secondary | ICD-10-CM | POA: Diagnosis not present

## 2019-10-29 ENCOUNTER — Telehealth: Payer: Self-pay | Admitting: Internal Medicine

## 2019-10-29 NOTE — Telephone Encounter (Signed)
Results previously provided, see result note.

## 2019-11-05 MED FILL — ALPRAZolam 0.5 MG TABS: 0.5 | 2 days supply | Qty: 4 | Fill #0

## 2019-11-06 ENCOUNTER — Other Ambulatory Visit: Payer: Self-pay

## 2019-11-06 ENCOUNTER — Encounter: Payer: Self-pay | Admitting: Internal Medicine

## 2019-11-06 ENCOUNTER — Ambulatory Visit (INDEPENDENT_AMBULATORY_CARE_PROVIDER_SITE_OTHER): Payer: 59 | Admitting: Internal Medicine

## 2019-11-06 DIAGNOSIS — J4520 Mild intermittent asthma, uncomplicated: Secondary | ICD-10-CM

## 2019-11-06 DIAGNOSIS — U071 COVID-19: Secondary | ICD-10-CM | POA: Diagnosis not present

## 2019-11-06 DIAGNOSIS — R918 Other nonspecific abnormal finding of lung field: Secondary | ICD-10-CM

## 2019-11-06 NOTE — Progress Notes (Signed)
Subjective:    Patient ID: [name], female    DOB: 27-Sep-1965, 54 y.o.   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 FVC 2.39/87%, FEV1 2.04/89%, FEV1/FVC 2.04, FEF 25-75 percent 2.59/87%. Unattended Home Sleep Test 07/15/16-AHI 13.9/hour, desaturation to 74%, body weight 240 pounds Resp Allergy Profile 06/29/16- Pos dog and timothy grass, IgE 41 HST 10/07/17-AHI 6.9/hour, desaturation to 81%, body weight 249 pounds -----------------------------------------------------------------------.  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 Neb albut, Symbicort 160, Singulair, Ventolin hfa -----f/u post COVID/allergic asthma. Breathing is a patient's baseline.  Says she lost 40 lbs from covid, but declines to be weighed Still tired but feeling very much better. Some twinges of sternal pain she identifiess as "musculoskeletal". Palpitation evaluated by cardiology.  Discussed lung nodules on CT, for f/u CT in 4 months.  HRCT chest 05/29/19-  IMPRESSION: 1. No evidence of interstitial lung disease. 2. Three scattered solid pulmonary nodules, largest 1.1 cm in the left upper lobe. Non-contrast chest CT at 3-6 months is recommended. If the nodules are stable at time of repeat CT, then future CT at 18-24 months (from today's scan) is considered optional for low-risk patients, but is recommended for high-risk patients. This recommendation follows the consensus statement: Guidelines for Management of Incidental Pulmonary Nodules  Detected on CT Images: From the Fleischner Society 2017; Radiology 2017; [phone].   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 -----states she has days where she feels more winded, no taste or smell Ventolin hfa, Dymista, neb albuterol, singulair, Xyzal,  She feels "not myself"- somewhat depressed, but denies intent to harm herself. Not accepting of referral to [facility], so I asked her to discuss with her PCP. Still has no sense of taste or smell. We reviewed good report from CT.  HRCT chest 10/29/19-   IMPRESSION: 1. Interval resolution of left upper lobe pulmonary nodules seen on prior examination, consistent with resolution of infection or inflammation. 2. Stable, definitively benign 3 mm fissural nodule of the right lower lobe. No further routine CT follow-up is required. 3. No abnormality of the lungs to explain shortness of breath. No evidence of fibrotic interstitial lung disease.   ROS-see HPI    + = positive Constitutional:     weight loss, night sweats, fevers, chills, +fatigue, lassitude. HEENT:   +headaches, difficulty swallowing, tooth/dental problems, sore throat,       No-  sneezing, itching, ear ache, nasal congestion, post nasal drip,  CV:    chest pain, orthopnea, PND, swelling in lower extremities, anasarca,  dizziness, +palpitations Resp: + shortness of breath with exertion or at rest.                 productive cough, non-productive cough,  No- coughing up of blood.                  change in color of mucus.  wheezing.   Skin:  No-   rash or lesions. GI:  No-   heartburn, indigestion, abdominal pain, nausea, vomiting,  GU:  MS:  No-   joint pain or swelling.   Neuro-     nothing unusual Psych:  No- change in mood or affect. +  depression or anxiety.  No memory loss.  OBJ- Physical Exam General- Alert, Oriented, Affect+ subdued, tearful, Distress- none acute,  + obese Skin- rash-none, lesions- none, excoriation- none Lymphadenopathy- none Head- atraumatic            Eyes- Gross vision intact, PERRLA, conjunctivae and secretions clear            Ears- Hearing, canals-normal            Nose- sniffing-none, no-Septal dev, mucus, polyps, erosion, perforation             Throat- Mallampati III , mucosa clear , drainage- none, tonsils- atrophic Neck- flexible , trachea midline, no stridor , thyroid nl, carotid no bruit Chest - symmetrical excursion , unlabored           Heart/CV- RRR , no murmur , no gallop  , no rub, nl s1 s2                           - JVD- none , edema- none, stasis changes- none, varices- none           Lung-   clear, wheeze -none, cough+slight dry , dullness-none, rub- none           Chest wall-  Abd-  Br/ Gen/ Rectal- Not done, not indicated Extrem- cyanosis- none, clubbing, none, atrophy- none, strength- nl, cane Neuro- grossly intact to observation    Assessment & Plan:

## 2019-11-06 NOTE — Assessment & Plan Note (Signed)
Controlled and back to baseline after Covid pneumonia last year. Plan- continue present meds.

## 2019-11-06 NOTE — Assessment & Plan Note (Signed)
Depressed affect and persistent loss of smell and taste may all be post-covid symptoms as discussed.  Plan- Encourage walking and physical activity. Discuss mood with PCP.

## 2019-11-06 NOTE — Patient Instructions (Signed)
Please talk with your primary doctor about your mood since Covid.  We can continue current respiratory meds.   Please call if we can help

## 2019-11-06 NOTE — Assessment & Plan Note (Signed)
Reactive nodes have resolved.  Plan- incidental imaging as needed

## 2019-11-12 ENCOUNTER — Telehealth: Payer: Self-pay | Admitting: Neurology

## 2019-11-12 NOTE — Telephone Encounter (Signed)
Please let me know when you receive a referral for this patient so I can squeeze her in to my schedule in August thanks

## 2019-11-25 ENCOUNTER — Telehealth: Payer: Self-pay | Admitting: Internal Medicine

## 2019-11-25 ENCOUNTER — Emergency Department (HOSPITAL_COMMUNITY)
Admission: EM | Admit: 2019-11-25 | Discharge: 2019-11-25 | Disposition: A | Discharge status: Home / Self Care | Payer: 59 | Attending: Emergency Medicine | Admitting: Emergency Medicine

## 2019-11-25 ENCOUNTER — Encounter (HOSPITAL_COMMUNITY): Payer: Self-pay

## 2019-11-25 ENCOUNTER — Other Ambulatory Visit: Payer: Self-pay | Admitting: Internal Medicine

## 2019-11-25 ENCOUNTER — Other Ambulatory Visit: Payer: Self-pay

## 2019-11-25 DIAGNOSIS — Z7901 Long term (current) use of anticoagulants: Secondary | ICD-10-CM | POA: Diagnosis not present

## 2019-11-25 DIAGNOSIS — J4541 Moderate persistent asthma with (acute) exacerbation: Secondary | ICD-10-CM | POA: Diagnosis not present

## 2019-11-25 DIAGNOSIS — Z79899 Other long term (current) drug therapy: Secondary | ICD-10-CM | POA: Diagnosis not present

## 2019-11-25 DIAGNOSIS — I10 Essential (primary) hypertension: Secondary | ICD-10-CM | POA: Diagnosis not present

## 2019-11-25 DIAGNOSIS — R0602 Shortness of breath: Secondary | ICD-10-CM | POA: Insufficient documentation

## 2019-11-25 DIAGNOSIS — J45909 Unspecified asthma, uncomplicated: Secondary | ICD-10-CM | POA: Diagnosis present

## 2019-11-25 MED ORDER — METHYLPREDNISOLONE SODIUM SUCC 125 MG IJ SOLR
125.0000 mg | Freq: Once | INTRAMUSCULAR | Status: AC
  Administered 2019-11-25: 125 mg via INTRAMUSCULAR
  Filled 2019-11-25: qty 2

## 2019-11-25 MED ORDER — PREDNISONE 10 MG PO TABS: 50.0000 mg | ORAL_TABLET | Freq: Every day | ORAL | 0 refills | Status: DC

## 2019-11-25 MED ORDER — ALBUTEROL SULFATE HFA 108 (90 BASE) MCG/ACT IN AERS
6.0000 | INHALATION_SPRAY | Freq: Once | RESPIRATORY_TRACT | Status: AC
  Administered 2019-11-25: 6 via RESPIRATORY_TRACT
  Filled 2019-11-25: qty 6.7

## 2019-11-25 MED FILL — METOPROLOL SUCCINATE ER 25: 25 | 30 days supply | Qty: 30 | Fill #1

## 2019-11-25 MED FILL — predniSONE 10 MG TABS: 10 | 3 days supply | Qty: 15 | Fill #0

## 2019-11-25 NOTE — Discharge Instructions (Addendum)
We saw you in the ER for your asthma related complains. We gave you some breathing treatments in the ER, and seems like your symptoms have improved. Please take albuterol as needed every 4 hours. Please take the medications prescribed. Please refrain from smoking or smoke exposure. Please see a primary care doctor in 1 week. Return to the ER if your symptoms worsen.  

## 2019-11-25 NOTE — Telephone Encounter (Signed)
We do not have any availability advise she call her PCP or go to urgent care. We can send in prednisone taper (40mg  x 2 days; 30mg  x 2 days; 20mg  x 2 days; 10mg  x 2 days) if she would like and would recommend she take mucinex twice daily.

## 2019-11-25 NOTE — Telephone Encounter (Signed)
Patient contacted with decision of provider. She was given the option to have a pred taper called in but refused.

## 2019-11-25 NOTE — Telephone Encounter (Signed)
I called patient and relayed the information provided by [facility].  She was not happy and asking to speak with leadership. She is upset that she cannot get into see someone in her doctor's office and says the UC or her pcp office will not know what to do for her.  Beth, Lauren says we have availability in the injection room if you want for her to have a depo shot.  Lauren said it is your call.  Please advise and I will call the patient back and let her know your decision.  I have not mentioned the injection room.  Thank you.
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
## 2019-11-25 NOTE — Telephone Encounter (Signed)
I will not order a depo-medrol injection without her being seen. This is not typical practice for our office and we do not do this. We do not have same day appointments. She can call her PCP or go to UC. I offered to send in prednisone taper.

## 2019-11-25 NOTE — Telephone Encounter (Signed)
Patient calling today with history of asthma, increased cough not controlled on Tessalon, green to yellow sputum, wheeze continues even with Albuterol nebulizer (used 6 times yesterday), no fever or any other symptoms. Patient requesting to be able to come into the office and get a Depo injection. She is traveling out of the country next week. Please advise.

## 2019-11-26 NOTE — Telephone Encounter (Signed)
Tussionex refilled 

## 2019-11-26 NOTE — Telephone Encounter (Signed)
CY please advise on refill request.  Thanks!

## 2019-11-26 NOTE — Telephone Encounter (Signed)
Patient is returning phone call for med. Patient phone number is [phone].

## 2019-11-26 NOTE — Telephone Encounter (Signed)
Dr Jaynee Eagles we have not received a referral for this patient.

## 2019-11-27 DIAGNOSIS — Z1152 Encounter for screening for COVID-19: Secondary | ICD-10-CM | POA: Diagnosis not present

## 2019-11-27 DIAGNOSIS — Z20822 Contact with and (suspected) exposure to covid-19: Secondary | ICD-10-CM | POA: Diagnosis not present

## 2019-11-27 MED FILL — HYDROCODONE-CHLORPHEN ER SU: 10-8 | 12 days supply | Qty: 115 | Fill #0

## 2019-12-17 MED FILL — HYDROCODONE-CHLORPHEN ER SU: 10-8 | 12 days supply | Qty: 115 | Fill #0

## 2019-12-18 ENCOUNTER — Other Ambulatory Visit (HOSPITAL_COMMUNITY): Payer: Self-pay | Admitting: Orthopedic Surgery

## 2019-12-18 MED FILL — CYCLOBENZAPRINE HCL 5 MG TA: 5 | 30 days supply | Qty: 90 | Fill #0

## 2019-12-19 MED FILL — METOPROLOL SUCCINATE ER 25: 25 | 30 days supply | Qty: 30 | Fill #2

## 2019-12-24 MED FILL — AMLODIPINE BESYLATE 10 MG T: 10 | 90 days supply | Qty: 90 | Fill #0

## 2019-12-24 MED FILL — IBUPROFEN 800 MG TAB: 800 | 30 days supply | Qty: 90 | Fill #1

## 2019-12-24 MED FILL — traMADol HCL 50 MG TABS: 50 | 5 days supply | Qty: 30 | Fill #1

## 2019-12-29 DIAGNOSIS — F4320 Adjustment disorder, unspecified: Secondary | ICD-10-CM | POA: Diagnosis not present

## 2020-02-04 DIAGNOSIS — M21612 Bunion of left foot: Secondary | ICD-10-CM | POA: Diagnosis not present

## 2020-02-04 DIAGNOSIS — S86111A Strain of other muscle(s) and tendon(s) of posterior muscle group at lower leg level, right leg, initial encounter: Secondary | ICD-10-CM | POA: Diagnosis not present

## 2020-02-04 DIAGNOSIS — M2141 Flat foot [pes planus] (acquired), right foot: Secondary | ICD-10-CM | POA: Diagnosis not present

## 2020-02-04 DIAGNOSIS — M21611 Bunion of right foot: Secondary | ICD-10-CM | POA: Diagnosis not present

## 2020-02-04 DIAGNOSIS — D1724 Benign lipomatous neoplasm of skin and subcutaneous tissue of left leg: Secondary | ICD-10-CM | POA: Diagnosis not present

## 2020-02-10 DIAGNOSIS — F418 Other specified anxiety disorders: Secondary | ICD-10-CM | POA: Diagnosis not present

## 2020-02-10 DIAGNOSIS — R7303 Prediabetes: Secondary | ICD-10-CM | POA: Diagnosis not present

## 2020-02-10 DIAGNOSIS — E782 Mixed hyperlipidemia: Secondary | ICD-10-CM | POA: Diagnosis not present

## 2020-02-10 DIAGNOSIS — I10 Essential (primary) hypertension: Secondary | ICD-10-CM | POA: Diagnosis not present

## 2020-02-10 DIAGNOSIS — B948 Sequelae of other specified infectious and parasitic diseases: Secondary | ICD-10-CM | POA: Diagnosis not present

## 2020-02-10 DIAGNOSIS — G4733 Obstructive sleep apnea (adult) (pediatric): Secondary | ICD-10-CM | POA: Diagnosis not present

## 2020-02-10 DIAGNOSIS — J4540 Moderate persistent asthma, uncomplicated: Secondary | ICD-10-CM | POA: Diagnosis not present

## 2020-02-10 DIAGNOSIS — K219 Gastro-esophageal reflux disease without esophagitis: Secondary | ICD-10-CM | POA: Diagnosis not present

## 2020-02-10 DIAGNOSIS — N179 Acute kidney failure, unspecified: Secondary | ICD-10-CM | POA: Diagnosis not present

## 2020-02-24 DIAGNOSIS — M21611 Bunion of right foot: Secondary | ICD-10-CM | POA: Diagnosis not present

## 2020-02-24 DIAGNOSIS — D1724 Benign lipomatous neoplasm of skin and subcutaneous tissue of left leg: Secondary | ICD-10-CM | POA: Diagnosis not present

## 2020-02-24 DIAGNOSIS — M21612 Bunion of left foot: Secondary | ICD-10-CM | POA: Diagnosis not present

## 2020-02-24 DIAGNOSIS — M2141 Flat foot [pes planus] (acquired), right foot: Secondary | ICD-10-CM | POA: Diagnosis not present

## 2020-03-09 IMAGING — CT CT CHEST HIGH RESOLUTION W/O CM
1 of 4 series · 14 of 32 positions shown, 18 images · non-contrast
Comparison: 03/27/2019 chest CT angiogram.

CLINICAL DATA: [id number] pneumonia in February 2019. Evaluate for
interstitial lung disease.

EXAM:
CT CHEST WITHOUT CONTRAST
TECHNIQUE: Multidetector CT imaging of the chest was performed following the
standard protocol without intravenous contrast. High resolution
imaging of the lungs, as well as inspiratory and expiratory imaging,
was performed.

[Series 8: hi res 1mm · axial · 0.66mm/px · z∈[-181,+43]mm · 14 of 254 slices shown, 18 images]
[im 15/254  mediastinal]
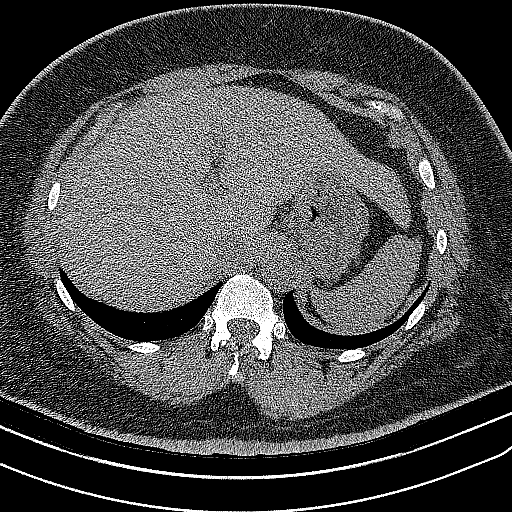
[im 15/254  lung]
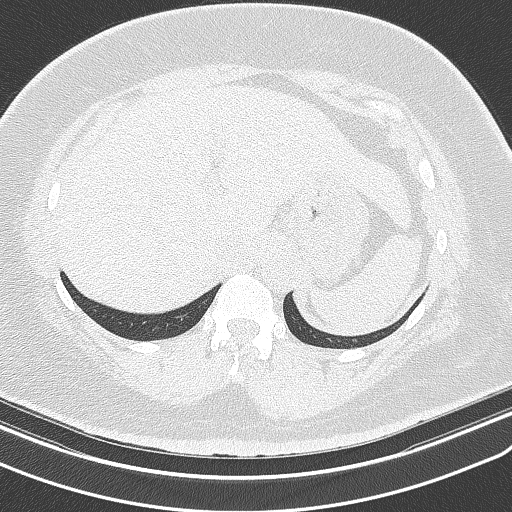
[im 30/254  lung]
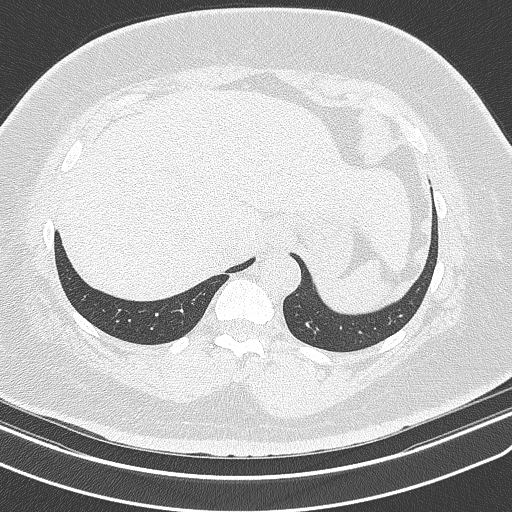
[im 60/254  lung]
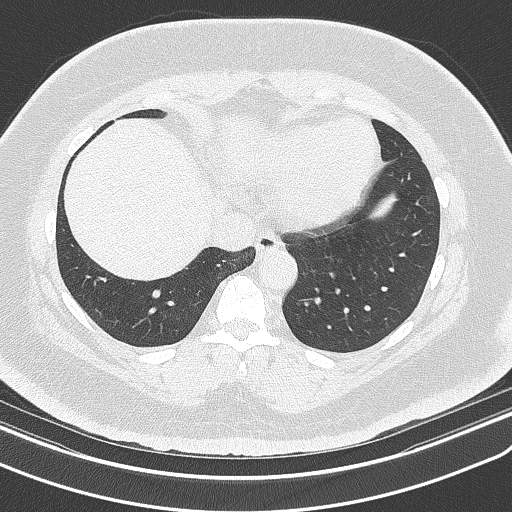
[im 75/254  lung]
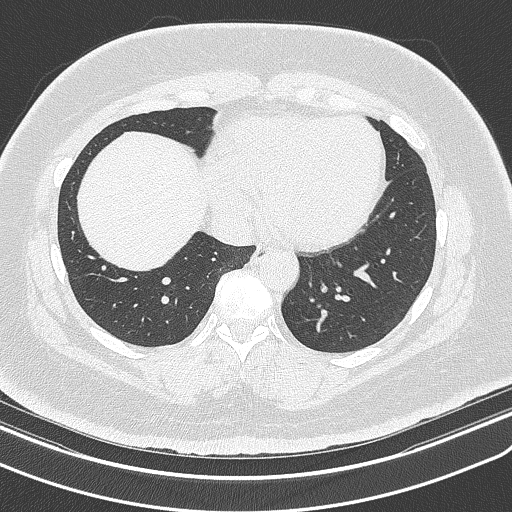
[im 90/254  mediastinal]
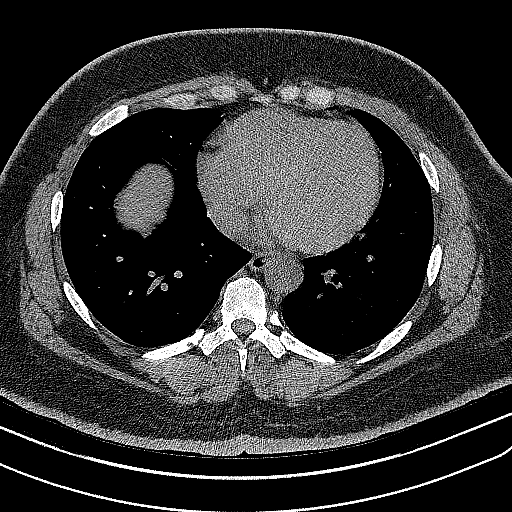
[im 90/254  lung]
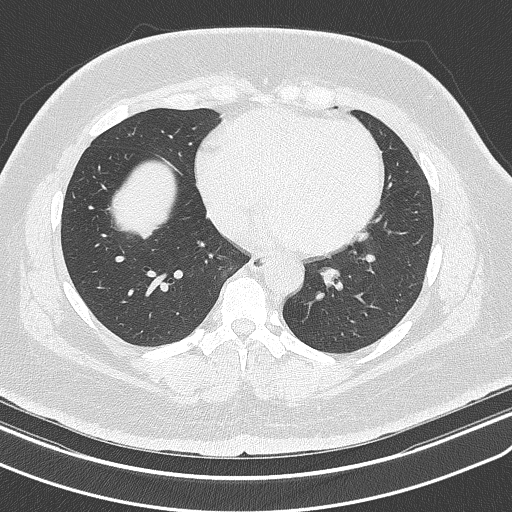
[im 105/254  lung]
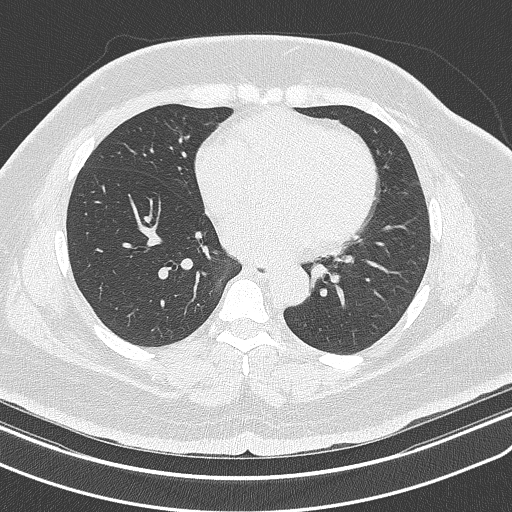
[im 120/254  lung]
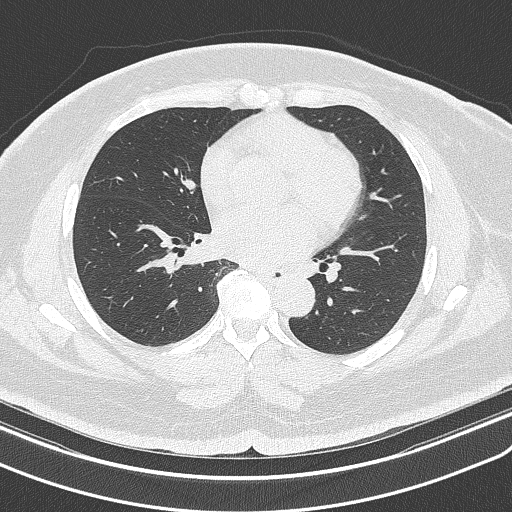
[im 127/254  lung]
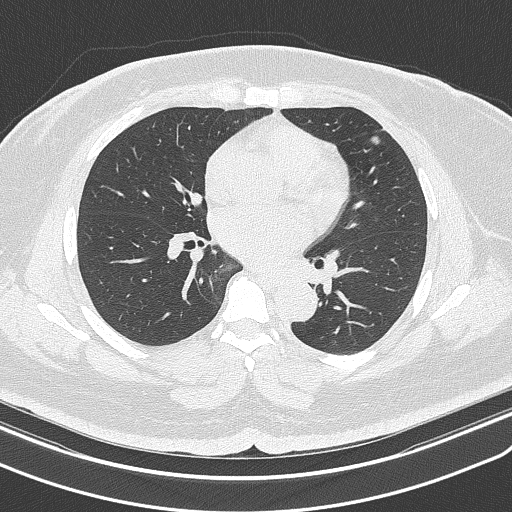
[im 134/254  mediastinal]
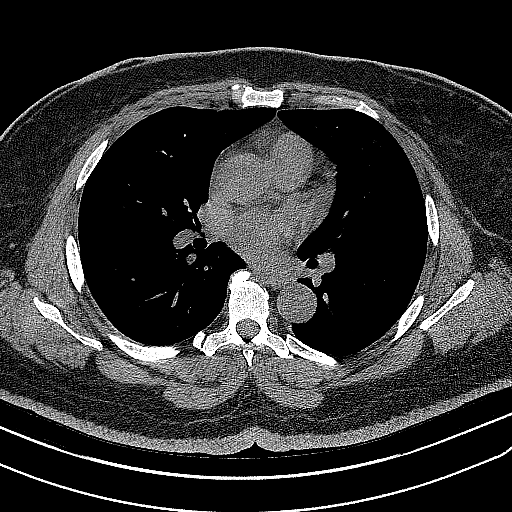
[im 134/254  lung]
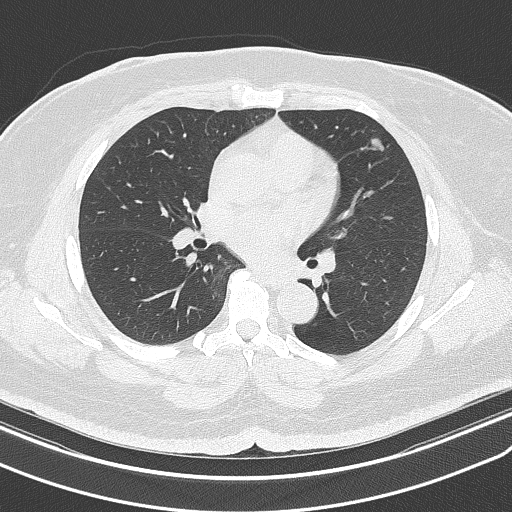
[im 164/254  lung]
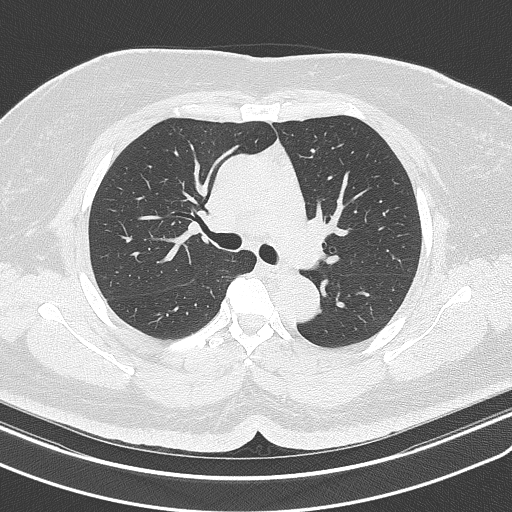
[im 179/254  lung]
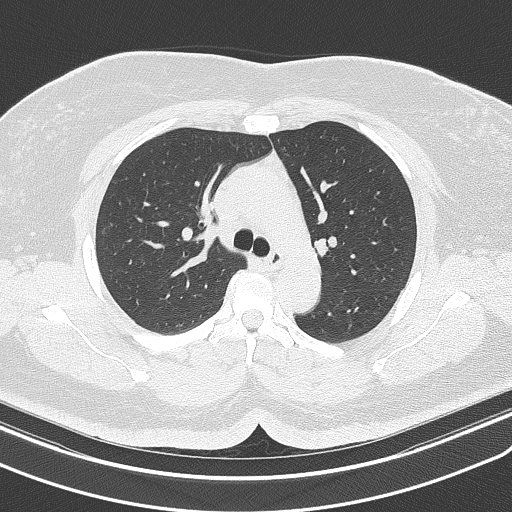
[im 194/254  lung]
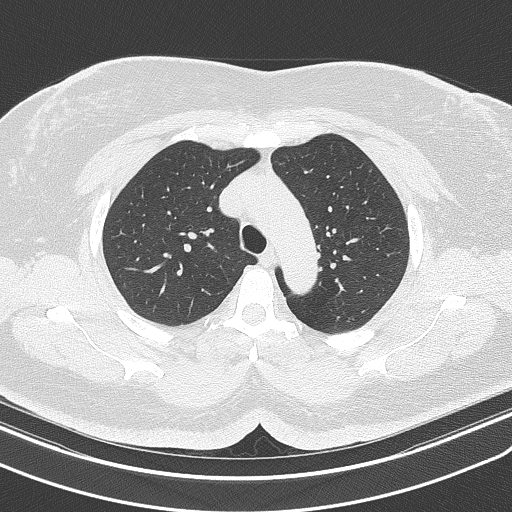
[im 224/254  mediastinal]
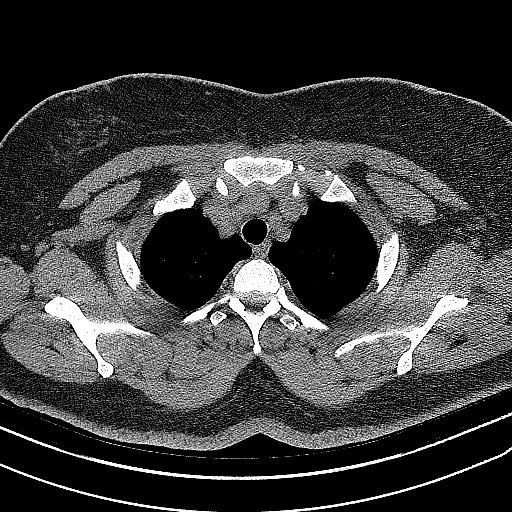
[im 224/254  lung]
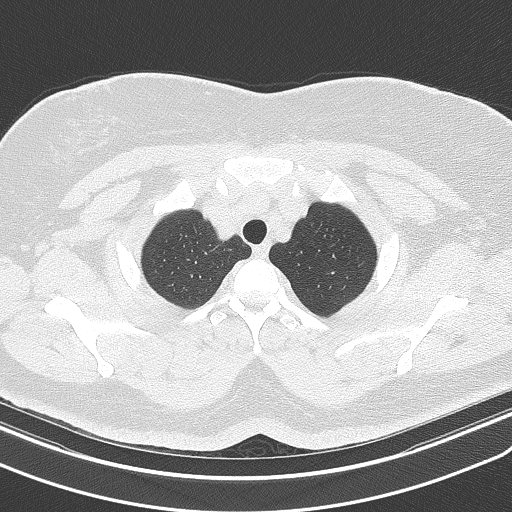
[im 239/254  lung]
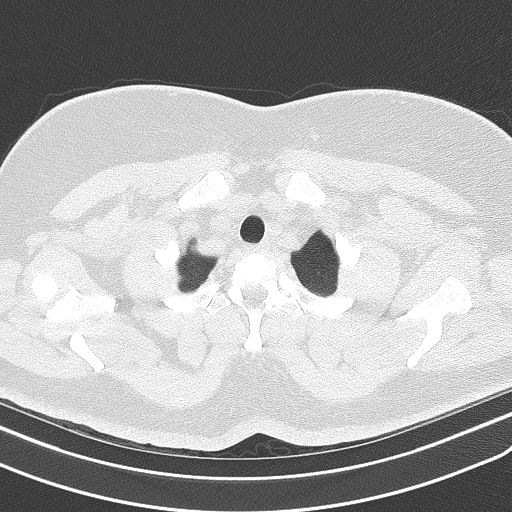

[14 of 32 positions shown; findings below may reference images not displayed]

FINDINGS: Cardiovascular: Normal heart size. No significant pericardial
effusion/thickening. Great vessels are normal in course and caliber.

Mediastinum/Nodes: No discrete thyroid nodules. Unremarkable
esophagus. No pathologically enlarged axillary, mediastinal or hilar
lymph nodes, noting limited sensitivity for the detection of hilar
adenopathy on this noncontrast study.

Lungs/Pleura: No pneumothorax. No pleural effusion. No central
airway stenoses. Solid 1.1 cm left upper lobe pulmonary nodule
(series 5/image 62). Separate 1.0 x 0.4 cm (average diameter 0.7 cm)
anterior left upper lobe solid pulmonary nodule (series 5/image 53).
Anterior right lower lobe 0.3 cm solid pulmonary nodule along the
major fissure (series 5/image 59). No acute consolidative airspace
disease, lung masses or additional significant pulmonary nodules. No
significant regions of subpleural reticulation, ground-glass
attenuation, traction bronchiectasis, architectural distortion,
parenchymal banding or frank honeycombing. No significant air
trapping or evidence of tracheobronchomalacia on the expiration
sequence. Tiny portion of the extreme posterior lung bases was
inadvertently excluded from the exam.

Upper abdomen: No acute abnormality.

Musculoskeletal: No aggressive appearing focal osseous lesions. Mild
thoracic spondylosis.
IMPRESSION: 1. No evidence of interstitial lung disease.
2. Three scattered solid pulmonary nodules, largest 1.1 cm in the
left upper lobe. Non-contrast chest CT at 3-6 months is recommended.
If the nodules are stable at time of repeat CT, then future CT at
18-24 months (from today's scan) is considered optional for low-risk
patients, but is recommended for high-risk patients. This
recommendation follows the consensus statement: Guidelines for
Management of Incidental Pulmonary Nodules Detected on CT Images:

## 2020-03-10 ENCOUNTER — Telehealth: Payer: Self-pay

## 2020-03-10 ENCOUNTER — Other Ambulatory Visit: Payer: Self-pay | Admitting: Internal Medicine

## 2020-03-10 MED ORDER — HYDROCOD POLST-CPM POLST ER 10-8 MG/5ML PO SUER: ORAL | 0 refills | Status: DC

## 2020-03-10 MED FILL — traMADol HCL 50 MG TABS: 50 | 5 days supply | Qty: 30 | Fill #0

## 2020-03-10 MED FILL — HYDROCODONE-CHLORPHEN ER SU: 10-8 | 12 days supply | Qty: 115 | Fill #0

## 2020-03-10 NOTE — Telephone Encounter (Signed)
Pt needs refill on tussinex next ov 05/09/20

## 2020-03-10 NOTE — Telephone Encounter (Signed)
Tusionex refill e-sent

## 2020-03-10 NOTE — Addendum Note (Signed)
Addended by: Baird Lyons D on: 03/10/2020 02:28 PM   Modules accepted: Orders

## 2020-03-10 NOTE — Telephone Encounter (Signed)
Tramadol refill e-sent

## 2020-03-10 NOTE — Telephone Encounter (Addendum)
Pt is requesting refill on traMADol HCL 50 MG TABS 50 Tablet next ov 05/09/20 . Pt is aware meds are available for pick up at pharmacy  Nothing futher needed

## 2020-03-14 DIAGNOSIS — D1724 Benign lipomatous neoplasm of skin and subcutaneous tissue of left leg: Secondary | ICD-10-CM | POA: Diagnosis not present

## 2020-03-14 DIAGNOSIS — M722 Plantar fascial fibromatosis: Secondary | ICD-10-CM | POA: Diagnosis not present

## 2020-03-14 DIAGNOSIS — M76821 Posterior tibial tendinitis, right leg: Secondary | ICD-10-CM | POA: Diagnosis not present

## 2020-03-14 DIAGNOSIS — M21611 Bunion of right foot: Secondary | ICD-10-CM | POA: Diagnosis not present

## 2020-03-14 DIAGNOSIS — M21612 Bunion of left foot: Secondary | ICD-10-CM | POA: Diagnosis not present

## 2020-03-21 MED FILL — traMADol HCL 50 MG TABS: 50 | 5 days supply | Qty: 30 | Fill #0

## 2020-03-21 MED FILL — HYDROCODONE-CHLORPHEN ER SU: 10-8 | 12 days supply | Qty: 115 | Fill #0

## 2020-04-09 MED FILL — traMADol HCL 50 MG TABS: 50 | 5 days supply | Qty: 30 | Fill #1

## 2020-04-09 MED FILL — METOPROLOL SUCCINATE ER 25: 25 | 30 days supply | Qty: 30 | Fill #3

## 2020-04-20 ENCOUNTER — Other Ambulatory Visit: Payer: Self-pay | Admitting: Pulmonary Disease

## 2020-04-20 ENCOUNTER — Telehealth: Payer: Self-pay | Admitting: Internal Medicine

## 2020-04-20 MED ORDER — PREDNISONE 10 MG PO TABS: ORAL_TABLET | ORAL | 0 refills | Status: DC

## 2020-04-20 MED FILL — predniSONE 10 MG TABS: 10 | 16 days supply | Qty: 40 | Fill #0

## 2020-04-20 NOTE — Telephone Encounter (Signed)
Spoke with pt, states she is having an asthma exacerbation- peak flow is 250, sob, wheezing, prod cough with thick white mucus. S/s present X2 days.    Denies fever, sinus congestion, chest pain.    Pt has been taking nyquil, delysm, tussionex, tessalon perles, albuterolneb q 2-3 hours.  Pt is requesting a depo-medrol injection and any additional recs.   [facility]   CY please advise on recs.  Thanks!

## 2020-04-20 NOTE — Telephone Encounter (Signed)
Spoke with pt and notified her that prednisone taper pack would be called in. Pt stated this would work and that she wanted to been seen next week in office. At this time I do not have any access to any open appointments next week. Pt stated she will keep ov with Dr. Maple Hudson on 05/09/2020. Pt stated understanding. Nothing further needed at this time.

## 2020-04-20 NOTE — Telephone Encounter (Signed)
PCCM:   Steroid taper 40mg  X 4 days, and decrease by 10mg  every 4 days then stop.  Keep scheduled appt with Dr. .   , DO [facility] 04/20/2020 4:57 PM

## 2020-04-20 NOTE — Telephone Encounter (Signed)
Routing to Dr. Tonia Brooms since CY has left for the day.  Please advise, thanks!

## 2020-05-03 MED FILL — traMADol HCL 50 MG TABS: 50 | 5 days supply | Qty: 30 | Fill #2

## 2020-05-04 ENCOUNTER — Other Ambulatory Visit: Payer: Self-pay | Admitting: Internal Medicine

## 2020-05-04 ENCOUNTER — Other Ambulatory Visit (HOSPITAL_COMMUNITY): Payer: Self-pay | Admitting: Orthopedic Surgery

## 2020-05-04 MED FILL — CYCLOBENZAPRINE HCL 5 MG TA: 5 | 30 days supply | Qty: 90 | Fill #1

## 2020-05-04 MED FILL — IBUPROFEN 800 MG TAB: 800 | 30 days supply | Qty: 90 | Fill #0

## 2020-05-04 MED FILL — AMLODIPINE BESYLATE 10 MG T: 10 | 90 days supply | Qty: 90 | Fill #1

## 2020-05-04 MED FILL — LORAZEPAM 1 MG TABS: 1 | 10 days supply | Qty: 30 | Fill #0

## 2020-05-04 NOTE — Telephone Encounter (Signed)
Called and spoke with patient to let her know that refill was sent into pharmacy. Nothing further needed at this time.

## 2020-05-04 NOTE — Telephone Encounter (Signed)
Lorazepam refilled

## 2020-05-04 NOTE — Telephone Encounter (Signed)
Received faxed refill request from [facility]  Medication name/strength/dose: Lorazepam 1 mg Medication last rx'd: 01/07/2019 Quantity and number of refills last rx'd: 3 Instructions:1 tablet every 8 hours as needed for anxiety  Last OV: 11/06/2019 Next OV: 05/09/2020  CY  please advise on refill request  Allergies  Allergen Reactions  . Influenza A (H1n1) Monoval Vac     Reaction: systemic reaction  . Omalizumab     Arvid Right* REACTION: angioedema-looses airway  . Promethazine Hcl     REACTION: hallucinations  . Topiramate    Current Outpatient Medications on File Prior to Visit  Medication Sig Dispense Refill  . albuterol (PROVENTIL) (2.5 MG/3ML) 0.083% nebulizer solution USE 1 VIAL VIA NEBULIZER EVERY 6 HOURS AS NEEDED FOR WHEEZING OR SHORTNESS OF BREATH (Patient taking differently: Take 2.5 mg by nebulization every 6 (six) hours as needed for wheezing or shortness of breath. ) 90 mL 12  . amLODipine (NORVASC) 10 MG tablet Take 10 mg by mouth daily.    . Ascorbic Acid (VITAMIN C) 500 MG CAPS Take by mouth daily.    . Azelastine-Fluticasone (DYMISTA) 137-50 MCG/ACT SUSP One puff twice daily ea nostril (Patient taking differently: Place 1 puff into both nostrils 2 (two) times daily. ) 23 g 5  . benzonatate (TESSALON) 200 MG capsule Take 1 capsule (200 mg total) by mouth 3 (three) times daily as needed for cough. 30 capsule 1  . budesonide-formoterol (SYMBICORT) 160-4.5 MCG/ACT inhaler Inhale 2 puffs into the lungs 2 (two) times daily. Rinse mouth 1 Inhaler 12  . chlorpheniramine-HYDROcodone (TUSSIONEX) 10-8 MG/5ML SUER TAKE 5 MLS BY MOUTH EVERY 12 HOURS AS NEEDED FOR COUGH 115 mL 0  . Cholecalciferol (VITAMIN D-3) 125 MCG (5000 UT) TABS Take by mouth daily.    . cyclobenzaprine (FLEXERIL) 5 MG tablet Take 1 tablet (5 mg total) by mouth 3 (three) times daily as needed for muscle spasms (or migraines). 50 tablet 0  . dextromethorphan-guaiFENesin (MUCINEX DM) 30-600 MG 12hr tablet Take 1 tablet  by mouth 2 (two) times daily. (Patient taking differently: Take 1 tablet by mouth as needed. ) 10 tablet 0  . econazole nitrate 1 % cream Apply 1 application topically daily as needed.   2  . EPINEPHRINE 0.3 mg/0.3 mL IJ SOAJ injection INJECT INTO THIGH FOR SEVERE ASTHMA/ ALLERGIC REACTION 2 each 12  . furosemide (LASIX) 40 MG tablet TAKE 1 TABLET BY MOUTH ONCE DAILY AS NEEDED (Patient taking differently: Take 40 mg by mouth daily as needed for fluid. ) 30 tablet 5  . ibuprofen (ADVIL,MOTRIN) 800 MG tablet Take 1 tablet by mouth daily as needed.  0  . levocetirizine (XYZAL) 5 MG tablet Take 1 tablet (5 mg total) by mouth every evening. 30 tablet 5  . loratadine (CLARITIN) 10 MG tablet Take 1 tablet (10 mg total) by mouth daily.    Marland Kitchen LORazepam (ATIVAN) 1 MG tablet 1 every 8 hours if needed for anxiety (Patient taking differently: Take 1 mg by mouth every 8 (eight) hours as needed for anxiety. ) 30 tablet 3  . metoprolol succinate (TOPROL-XL) 25 MG 24 hr tablet Take 25 mg by mouth daily.    . montelukast (SINGULAIR) 10 MG tablet Take 1 tablet (10 mg total) by mouth at bedtime. 30 tablet 11  . olopatadine (PATANOL) 0.1 % ophthalmic solution INSTILL 1 DROP INTO BOTH EYES TWO TIMES DAILY AS NEEDED FOR ALLERGIES (Patient taking differently: Place 1 drop into both eyes 2 (two) times daily as needed for allergies. )  5 mL 5  . predniSONE (DELTASONE) 10 MG tablet Take 5 tablets (50 mg total) by mouth daily. 15 tablet 0  . predniSONE (DELTASONE) 10 MG tablet Take 4 tablets (40 mg total) by mouth daily with breakfast for 4 days, THEN 3 tablets (30 mg total) daily with breakfast for 4 days, THEN 2 tablets (20 mg total) daily with breakfast for 4 days, THEN 1 tablet (10 mg total) daily with breakfast for 4 days. 40 mg x 4 days then decrease by 10 mg every 4 days then stop. 40 tablet 0  . Spacer/Aero-Holding Chambers (AEROCHAMBER MV) inhaler Use as instructed 1 each 0  . traMADol (ULTRAM) 50 MG tablet TAKE 1 TABLET  BY MOUTH EVERY 4 HOURS AS NEEDED FOR PAIN 30 tablet 5  . TRANSDERM-SCOP, 1.5 MG, 1 MG/3DAYS APPLY 1 PATCH ONTO THE SKIN EVERY 3 DAYS. (Patient taking differently: Place 1 patch onto the skin every 3 (three) days. ) 10 patch 12  . urea (CARMOL) 40 % CREA Apply 1 application topically daily as needed.   2  . VENTOLIN HFA 108 (90 Base) MCG/ACT inhaler INHALE 2 PUFFS BY MOUTH INTO THE LUNGS EVERY 6 HOURS AS NEEDED FOR WHEEZING (Patient taking differently: Inhale 2 puffs into the lungs every 6 (six) hours as needed for wheezing or shortness of breath. ) 18 g 12  . vitamin E 1000 UNIT capsule Take 1,000 Units by mouth daily.    Penne Lash 1.25 MG/0.5ML nebulizer solution     . zinc gluconate 50 MG tablet Take 50 mg by mouth at bedtime.     No current facility-administered medications on file prior to visit.

## 2020-05-09 ENCOUNTER — Ambulatory Visit: Payer: 59 | Admitting: Internal Medicine

## 2020-05-10 ENCOUNTER — Ambulatory Visit: Payer: 59

## 2020-05-10 NOTE — Progress Notes (Signed)
Subjective:    Patient ID: [name], female    DOB: 1965-07-31, 55 y.o.   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 FVC 2.39/87%, FEV1 2.04/89%, FEV1/FVC 2.04, FEF 25-75 percent 2.59/87%. Unattended Home Sleep Test 07/15/16-AHI 13.9/hour, desaturation to 74%, body weight 240 pounds Resp Allergy Profile 06/29/16- Pos dog and timothy grass, IgE 41 HST 10/07/17-AHI 6.9/hour, desaturation to 81%, body weight 249 pounds -----------------------------------------------------------------------.    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 -----states she has days where she feels more winded, no taste or smell Ventolin hfa, Dymista, neb albuterol, singulair, Xyzal,  She feels "not myself"- somewhat depressed, but denies intent to harm herself. Not accepting of referral to [facility], so I asked her to discuss with her PCP. Still has no sense of taste or smell. We reviewed good report from CT.  HRCT chest 10/29/19-   IMPRESSION: 1. Interval resolution of left upper lobe pulmonary nodules seen on prior examination, consistent with resolution of infection or inflammation. 2. Stable, definitively benign 3 mm fissural nodule of the right lower lobe. No further routine CT follow-up is required. 3. No abnormality of the lungs to explain shortness of breath. No evidence of fibrotic interstitial lung disease.  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 Had exacerbations in August and December. I raised possibility of exploring another Biologic. She had reacted to Xolair and wasn't interested at this time.  Still hasn't recovered sense of taste after Covid infection in 2020. Using neb 3-x/ day currently. Not using rescue hfa. Never really cleared after December flare. No fever, sinus trouble, etc, but continue to cough with green sputum. HRCT chest 10/29/19-  IMPRESSION: 1. Interval resolution of left upper lobe pulmonary nodules seen on prior examination, consistent with resolution of infection or inflammation. 2. Stable, definitively benign 3 mm fissural nodule of the right lower lobe. No further routine CT follow-up is required. 3. No abnormality of the lungs to explain shortness of breath. No evidence of fibrotic interstitial lung disease.  ROS-see HPI    + = positive Constitutional:     weight loss, night sweats, fevers, chills, +fatigue, lassitude. HEENT:   +headaches, difficulty swallowing, tooth/dental problems, sore throat,       No-  sneezing, itching, ear ache, nasal congestion, post nasal drip,  CV:    chest pain, orthopnea, PND, swelling in lower extremities, anasarca,  dizziness, +palpitations Resp: + shortness of breath with exertion or at rest.                 +productive cough, non-productive cough,  No- coughing up of blood.  change in color of mucus.  wheezing.   Skin: No-   rash or lesions. GI:  No-   heartburn, indigestion, abdominal pain, nausea, vomiting,  GU:  MS:  No-   joint pain or swelling.   Neuro-      nothing unusual Psych:  No- change in mood or affect. + depression or anxiety.  No memory loss.  OBJ- Physical Exam General- Alert, Oriented, Affect -appropriatel, Distress- none acute,  + obese Skin- rash-none, lesions- none, excoriation- none Lymphadenopathy- none Head- atraumatic            Eyes- Gross vision intact, PERRLA, conjunctivae and secretions clear            Ears- Hearing, canals-normal            Nose- sniffing-none, no-Septal dev, mucus, polyps, erosion, perforation             Throat- Mallampati III , mucosa clear , drainage- none, tonsils- atrophic Neck- flexible , trachea midline, no stridor , thyroid nl, carotid no bruit Chest - symmetrical excursion , unlabored           Heart/CV- RRR , no murmur , no gallop  , no rub, nl s1 s2                           - JVD- none , edema- none, stasis changes- none, varices- none           Lung-   clear, wheeze -none, cough+ dry , dullness-none, rub- none           Chest wall-  Abd-  Br/ Gen/ Rectal- Not done, not indicated Extrem- cyanosis- none, clubbing, none, atrophy- none, strength- nl, cane Neuro- grossly intact to observation    Assessment & Plan:

## 2020-05-11 ENCOUNTER — Other Ambulatory Visit: Payer: Self-pay | Admitting: Internal Medicine

## 2020-05-11 ENCOUNTER — Ambulatory Visit (INDEPENDENT_AMBULATORY_CARE_PROVIDER_SITE_OTHER): Payer: 59 | Admitting: Internal Medicine

## 2020-05-11 ENCOUNTER — Encounter: Payer: Self-pay | Admitting: Internal Medicine

## 2020-05-11 ENCOUNTER — Other Ambulatory Visit: Payer: Self-pay

## 2020-05-11 VITALS — BP 124/74 | HR 74 | Temp 97.6°F | Ht 63.0 in | Wt 261.0 lb

## 2020-05-11 DIAGNOSIS — J4541 Moderate persistent asthma with (acute) exacerbation: Secondary | ICD-10-CM

## 2020-05-11 DIAGNOSIS — U071 COVID-19: Secondary | ICD-10-CM

## 2020-05-11 DIAGNOSIS — J4520 Mild intermittent asthma, uncomplicated: Secondary | ICD-10-CM

## 2020-05-11 MED ORDER — METHYLPREDNISOLONE ACETATE 80 MG/ML IJ SUSP
80.0000 mg | Freq: Once | INTRAMUSCULAR | Status: AC
Start: 2020-05-11 — End: 2020-05-11
  Administered 2020-05-11: 80 mg via INTRAMUSCULAR

## 2020-05-11 MED ORDER — AZITHROMYCIN 250 MG PO TABS: ORAL_TABLET | ORAL | 0 refills | Status: DC

## 2020-05-11 MED ORDER — PREDNISONE 10 MG PO TABS: ORAL_TABLET | ORAL | 0 refills | Status: DC

## 2020-05-11 MED ORDER — HYDROCOD POLST-CPM POLST ER 10-8 MG/5ML PO SUER: 5.0000 mL | Freq: Two times a day (BID) | ORAL | 0 refills | Status: DC | PRN

## 2020-05-11 MED FILL — HYDROCODONE-CHLORPHEN ER SU: 10-8 | 20 days supply | Qty: 200 | Fill #0

## 2020-05-11 MED FILL — AZITHROMYCIN 250 MG TABLET: 250 | 6 days supply | Qty: 6 | Fill #0

## 2020-05-11 MED FILL — predniSONE 10 MG TABS: 10 | 8 days supply | Qty: 20 | Fill #0

## 2020-05-11 NOTE — Patient Instructions (Signed)
Order- depo 80  Dx  Asthma exacerbation  Scripts sent for prednisone, Zpak and Tussionex  Keep on with your regular meds as before  Please call if we can help

## 2020-05-11 NOTE — Assessment & Plan Note (Signed)
Still hasn't recovered sense of taste. Discussed.

## 2020-05-11 NOTE — Assessment & Plan Note (Signed)
Recurrent episodes probably several different environmental triggers, including temperature. She may need some encouragement to start thinking about trying a different Biologic after failing Xolair. Green sputum, no fever. Plan- Zpak, prednisone, refill tussionex with discussion, depomedrol today

## 2020-05-11 NOTE — Assessment & Plan Note (Signed)
She remains quite heavy, reflecting diet and exercise. I believe this contributes substantially to her dyspnea on exertion. Plan- try to keep her focused on weight loss goal.

## 2020-05-12 ENCOUNTER — Ambulatory Visit: Payer: 59 | Attending: Internal Medicine

## 2020-05-12 DIAGNOSIS — Z23 Encounter for immunization: Secondary | ICD-10-CM

## 2020-05-12 NOTE — Progress Notes (Signed)
   Covid-19 Vaccination Clinic  Name:  [name]    MRN: [id number] DOB: [birth year]  05/12/2020  [name] was observed post Covid-19 immunization for 30 minutes based on pre-vaccination screening without incident. She was provided with Vaccine Information Sheet and instruction to access the V-Safe system.   [name] was instructed to call 911 with any severe reactions post vaccine: Marland Kitchen Difficulty breathing  . Swelling of face and throat  . A fast heartbeat  . A bad rash all over body  . Dizziness and weakness   Immunizations Administered    Name Date Dose VIS Date Route   Moderna Covid-19 Booster Vaccine 05/12/2020  2:42 PM 0.25 mL 02/10/2020 Intramuscular   Manufacturer: Moderna   Lot: [id number]   [place]: [phone]

## 2020-05-20 DIAGNOSIS — H5203 Hypermetropia, bilateral: Secondary | ICD-10-CM | POA: Diagnosis not present

## 2020-06-01 MED FILL — traMADol HCL 50 MG TABS: 50 | 5 days supply | Qty: 30 | Fill #3

## 2020-06-22 ENCOUNTER — Other Ambulatory Visit (HOSPITAL_COMMUNITY): Payer: Self-pay | Admitting: Orthopedic Surgery

## 2020-06-22 MED FILL — LORAZEPAM 1 MG TABS: 1 | 10 days supply | Qty: 30 | Fill #1

## 2020-06-22 MED FILL — IBUPROFEN 800 MG TAB: 800 | 30 days supply | Qty: 90 | Fill #1

## 2020-06-22 MED FILL — CYCLOBENZAPRINE HCL 5 MG TA: 5 | 30 days supply | Qty: 90 | Fill #0

## 2020-06-22 MED FILL — traMADol HCL 50 MG TABS: 50 | 5 days supply | Qty: 30 | Fill #4

## 2020-06-22 MED FILL — METOPROLOL SUCCINATE ER 25: 25 | 30 days supply | Qty: 30 | Fill #4

## 2020-06-30 ENCOUNTER — Other Ambulatory Visit (HOSPITAL_COMMUNITY): Payer: Self-pay | Admitting: Internal Medicine

## 2020-06-30 DIAGNOSIS — J4540 Moderate persistent asthma, uncomplicated: Secondary | ICD-10-CM | POA: Diagnosis not present

## 2020-06-30 DIAGNOSIS — K219 Gastro-esophageal reflux disease without esophagitis: Secondary | ICD-10-CM | POA: Diagnosis not present

## 2020-06-30 DIAGNOSIS — G4733 Obstructive sleep apnea (adult) (pediatric): Secondary | ICD-10-CM | POA: Diagnosis not present

## 2020-06-30 DIAGNOSIS — E782 Mixed hyperlipidemia: Secondary | ICD-10-CM | POA: Diagnosis not present

## 2020-06-30 DIAGNOSIS — R7303 Prediabetes: Secondary | ICD-10-CM | POA: Diagnosis not present

## 2020-06-30 DIAGNOSIS — I10 Essential (primary) hypertension: Secondary | ICD-10-CM | POA: Diagnosis not present

## 2020-07-02 MED FILL — traMADol HCL 50 MG TABS: 50 | 5 days supply | Qty: 30 | Fill #5

## 2020-07-26 ENCOUNTER — Other Ambulatory Visit (HOSPITAL_COMMUNITY): Payer: Self-pay | Admitting: Orthopedic Surgery

## 2020-07-26 ENCOUNTER — Other Ambulatory Visit (HOSPITAL_COMMUNITY): Payer: Self-pay

## 2020-07-26 ENCOUNTER — Other Ambulatory Visit: Payer: Self-pay | Admitting: Internal Medicine

## 2020-07-26 MED ORDER — TRAMADOL HCL 50 MG PO TABS
ORAL_TABLET | ORAL | 5 refills | Status: AC | PRN
  Filled 2020-07-26: qty 30, 5d supply, fill #0
  Filled 2020-08-23: qty 30, 5d supply, fill #1
  Filled 2020-09-27: qty 30, 5d supply, fill #2
  Filled 2020-10-31: qty 30, 5d supply, fill #3

## 2020-07-26 MED FILL — Cyclobenzaprine HCl Tab 5 MG: ORAL | 30 days supply | Qty: 90 | Fill #0 | Status: AC

## 2020-07-26 NOTE — Telephone Encounter (Signed)
Called and made patient aware RX has been sent in. She expressed understanding. Nothing further needed at this time.

## 2020-07-26 NOTE — Telephone Encounter (Signed)
Refill sent as requested. 

## 2020-07-26 NOTE — Telephone Encounter (Signed)
Received faxed refill request from  pharmacy  Medication name/strength/dose: tramadol 50 mg  Medication last rx'd: 03/10/2020 Quantity and number of refills last rx'd: #30 with 5 refills Instructions: take one tablet by mouth every 4 hours as needed  Last OV: 05/11/2020 Next OV: 11/08/2020  CY please advise on refill request  Allergies  Allergen Reactions  . Influenza A (H1n1) Monoval Vac     Reaction: systemic reaction  . Omalizumab     Arvid Right* REACTION: angioedema-looses airway  . Promethazine Hcl     REACTION: hallucinations  . Topiramate    Current Outpatient Medications on File Prior to Visit  Medication Sig Dispense Refill  . albuterol (PROVENTIL) (2.5 MG/3ML) 0.083% nebulizer solution USE 1 VIAL VIA NEBULIZER EVERY 6 HOURS AS NEEDED FOR WHEEZING OR SHORTNESS OF BREATH (Patient taking differently: Take 2.5 mg by nebulization every 6 (six) hours as needed for wheezing or shortness of breath.) 90 mL 12  . amLODipine (NORVASC) 10 MG tablet Take 10 mg by mouth daily.    Marland Kitchen amLODipine (NORVASC) 10 MG tablet TAKE 1 TABLET BY MOUTH ONCE A DAY 30 tablet 5  . amLODipine (NORVASC) 10 MG tablet TAKE 1 TABLET BY MOUTH ONCE DAILY 30 tablet 5  . Ascorbic Acid (VITAMIN C) 500 MG CAPS Take by mouth daily.    . Azelastine-Fluticasone (DYMISTA) 137-50 MCG/ACT SUSP One puff twice daily ea nostril (Patient taking differently: Place 1 puff into both nostrils 2 (two) times daily.) 23 g 5  . azithromycin (ZITHROMAX) 250 MG tablet TAKE 2 TABLETS BY MOUTH ON DAY 1 THEN TAKE 1 TABLET DAILY FOR THE NEXT 4 DAYS 6 tablet 0  . benzonatate (TESSALON) 200 MG capsule Take 1 capsule (200 mg total) by mouth 3 (three) times daily as needed for cough. 30 capsule 1  . budesonide-formoterol (SYMBICORT) 160-4.5 MCG/ACT inhaler Inhale 2 puffs into the lungs 2 (two) times daily. Rinse mouth 1 Inhaler 12  . budesonide-formoterol (SYMBICORT) 160-4.5 MCG/ACT inhaler INHALE 2 PUFFS BY MOUTH 2 TIMES DAILY 10.2 g 9  .  chlorpheniramine-HYDROcodone (TUSSIONEX) 10-8 MG/5ML SUER TAKE 5 ML BY MOUTH EVERY 12 HOURS AS NEEDED FOR COUGH 200 mL 0  . chlorpheniramine-HYDROcodone (TUSSIONEX) 10-8 MG/5ML SUER TAKE 5 MLS BY MOUTH EVERY 12 HOURS AS NEEDED FOR COUGH 115 mL 0  . Cholecalciferol (VITAMIN D-3) 125 MCG (5000 UT) TABS Take by mouth daily.    . cyclobenzaprine (FLEXERIL) 5 MG tablet Take 1 tablet (5 mg total) by mouth 3 (three) times daily as needed for muscle spasms (or migraines). 50 tablet 0  . cyclobenzaprine (FLEXERIL) 5 MG tablet TAKE 1 TABLET BY MOUTH 3 TIMES DAILY AS NEEDED 90 tablet 1  . cyclobenzaprine (FLEXERIL) 5 MG tablet TAKE 1 TABLET BY MOUTH 3 TIMES DAILY AS NEEDED 90 tablet 1  . dextromethorphan-guaiFENesin (MUCINEX DM) 30-600 MG 12hr tablet Take 1 tablet by mouth 2 (two) times daily. (Patient taking differently: Take 1 tablet by mouth as needed.) 10 tablet 0  . econazole nitrate 1 % cream Apply 1 application topically daily as needed.   2  . EPINEPHRINE 0.3 mg/0.3 mL IJ SOAJ injection INJECT INTO THIGH FOR SEVERE ASTHMA/ ALLERGIC REACTION 2 each 12  . furosemide (LASIX) 40 MG tablet TAKE 1 TABLET BY MOUTH ONCE DAILY AS NEEDED (Patient taking differently: Take 40 mg by mouth daily as needed for fluid.) 30 tablet 5  . furosemide (LASIX) 40 MG tablet TAKE 1 TABLET BY MOUTH ONCE A DAY 90 tablet 3  . ibuprofen (  ADVIL) 800 MG tablet TAKE 1 TABLET BY MOUTH 3 TIMES DAILY AS NEEDED 90 tablet 1  . ibuprofen (ADVIL,MOTRIN) 800 MG tablet Take 1 tablet by mouth daily as needed.  0  . levalbuterol (XOPENEX HFA) 45 MCG/ACT inhaler INHALE 2 PUFFS BY MOUTH INTO THE LUNGS EVERY 4 HOURS 15 g 9  . levocetirizine (XYZAL) 5 MG tablet Take 1 tablet (5 mg total) by mouth every evening. 30 tablet 5  . loratadine (CLARITIN) 10 MG tablet Take 1 tablet (10 mg total) by mouth daily.    Marland Kitchen LORazepam (ATIVAN) 1 MG tablet TAKE 1 TABLET BY MOUTH EVERY 8 HOURS AS NEEDED FOR ANXIETY 30 tablet 5  . metoprolol succinate (TOPROL-XL) 25 MG  24 hr tablet Take 25 mg by mouth daily.    . metoprolol succinate (TOPROL-XL) 25 MG 24 hr tablet TAKE 1 TABLET BY MOUTH ONCE A DAY 30 tablet 5  . metoprolol succinate (TOPROL-XL) 25 MG 24 hr tablet TAKE 1 TABLET BY MOUTH ONCE DAILY 30 tablet 5  . montelukast (SINGULAIR) 10 MG tablet Take 1 tablet (10 mg total) by mouth at bedtime. 30 tablet 11  . olopatadine (PATANOL) 0.1 % ophthalmic solution INSTILL 1 DROP INTO BOTH EYES TWO TIMES DAILY AS NEEDED FOR ALLERGIES (Patient taking differently: Place 1 drop into both eyes 2 (two) times daily as needed for allergies.) 5 mL 5  . pantoprazole (PROTONIX) 40 MG tablet TAKE 1 TABLET BY MOUTH ONCE A DAY 90 tablet 3  . predniSONE (DELTASONE) 10 MG tablet TAKE 4 TABLETS DAILY FOR 2 DAYS, THEN 3 TABLETS DAILY FOR 2 DAYS, THEN 2 TABLETS DAILY FOR 2 DAYS, THEN 1 TABLET DAILY FOR 2 DAYS. 20 tablet 0  . predniSONE (DELTASONE) 10 MG tablet TAKE 4 TABLETS DAILY X 4 DAYS, 3 TABS DAILY X 4 DAYS, 2 TABS DAILY X 4 DAYS THEN 1 TAB DAILY X 4 DAYS WITH BREAKFAST 40 tablet 0  . Spacer/Aero-Holding Chambers (AEROCHAMBER MV) inhaler Use as instructed 1 each 0  . traMADol (ULTRAM) 50 MG tablet TAKE 1 TABLET BY MOUTH EVERY 4 HOURS AS NEEDED FOR PAIN 30 tablet 5  . TRANSDERM-SCOP, 1.5 MG, 1 MG/3DAYS APPLY 1 PATCH ONTO THE SKIN EVERY 3 DAYS. (Patient taking differently: Place 1 patch onto the skin every 3 (three) days.) 10 patch 12  . urea (CARMOL) 40 % CREA Apply 1 application topically daily as needed.   2  . VENTOLIN HFA 108 (90 Base) MCG/ACT inhaler INHALE 2 PUFFS BY MOUTH INTO THE LUNGS EVERY 6 HOURS AS NEEDED FOR WHEEZING (Patient taking differently: Inhale 2 puffs into the lungs every 6 (six) hours as needed for wheezing or shortness of breath.) 18 g 12  . vitamin E 1000 UNIT capsule Take 1,000 Units by mouth daily.    Penne Lash 1.25 MG/0.5ML nebulizer solution     . zinc gluconate 50 MG tablet Take 50 mg by mouth at bedtime.     No current facility-administered medications  on file prior to visit.

## 2020-07-27 ENCOUNTER — Other Ambulatory Visit (HOSPITAL_COMMUNITY): Payer: Self-pay

## 2020-07-27 MED ORDER — IBUPROFEN 800 MG PO TABS
800.0000 mg | ORAL_TABLET | Freq: Three times a day (TID) | ORAL | 1 refills | Status: DC | PRN
  Filled 2020-07-27: qty 90, 30d supply, fill #0
  Filled 2020-10-28: qty 90, 30d supply, fill #1

## 2020-08-02 ENCOUNTER — Ambulatory Visit (INDEPENDENT_AMBULATORY_CARE_PROVIDER_SITE_OTHER): Payer: 59 | Admitting: Gastroenterology

## 2020-08-02 ENCOUNTER — Other Ambulatory Visit (HOSPITAL_COMMUNITY): Payer: Self-pay

## 2020-08-02 ENCOUNTER — Encounter: Payer: Self-pay | Admitting: Gastroenterology

## 2020-08-02 DIAGNOSIS — K625 Hemorrhage of anus and rectum: Secondary | ICD-10-CM | POA: Diagnosis not present
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
## 2020-08-02 NOTE — Patient Instructions (Signed)
If you are age 53 or younger, your body mass index should be between 19-25. Your Body mass index is 47.83 kg/m. If this is out of the aformentioned range listed, please consider follow up with your Primary Care Provider.   You have been scheduled for a colonoscopy. Please follow written instructions given to you at your visit today.  Please pick up your prep supplies at the pharmacy within the next 1-3 days. If you use inhalers (even only as needed), please bring them with you on the day of your procedure.  Due to recent changes in healthcare laws, you may see the results of your imaging and laboratory studies on MyChart before your provider has had a chance to review them.  We understand that in some cases there may be results that are confusing or concerning to you. Not all laboratory results come back in the same time frame and the provider may be waiting for multiple results in order to interpret others.  Please give [place] 48 hours in order for your provider to thoroughly review all the results before contacting the office for clarification of your results.   Thank you for entrusting me with your care and choosing [facility].  Dr Ardis Hughs

## 2020-08-08 ENCOUNTER — Encounter: Payer: Self-pay | Admitting: Gastroenterology

## 2020-08-08 ENCOUNTER — Other Ambulatory Visit: Payer: Self-pay

## 2020-08-08 ENCOUNTER — Ambulatory Visit (AMBULATORY_SURGERY_CENTER): Payer: 59 | Admitting: Gastroenterology

## 2020-08-08 VITALS — BP 108/72 | HR 62 | Temp 97.5°F | Resp 12 | Ht 63.0 in | Wt 270.0 lb

## 2020-08-08 DIAGNOSIS — K625 Hemorrhage of anus and rectum: Secondary | ICD-10-CM

## 2020-08-08 DIAGNOSIS — Z538 Procedure and treatment not carried out for other reasons: Secondary | ICD-10-CM | POA: Diagnosis not present

## 2020-08-08 DIAGNOSIS — K648 Other hemorrhoids: Secondary | ICD-10-CM | POA: Diagnosis not present

## 2020-08-08 MED ORDER — SODIUM CHLORIDE 0.9 % IV SOLN: 500.0000 mL | Freq: Once | INTRAVENOUS | Status: DC

## 2020-08-08 NOTE — Progress Notes (Signed)
pt tolerated well. VSS. awake and to recovery. Report given to RN.  

## 2020-08-08 NOTE — Op Note (Signed)
[facility] Patient Name: [name] Procedure Date: 08/08/2020 3:07 PM MRN: [id number] Endoscopist: Milus Banister , MD Age: 55 Referring MD:  Date of Birth: [birth year] Gender: Female Account #: [id number] Procedure:                Colonoscopy Indications:              Rectal bleeding; colonoscopy 2016 no polyps or                            cancers but + internal hemorrhoids Medicines:                Monitored Anesthesia Care Procedure:                Pre-Anesthesia Assessment:                           - Prior to the procedure, a History and Physical                            was performed, and patient medications and                            allergies were reviewed. The patient's tolerance of                            previous anesthesia was also reviewed. The risks                            and benefits of the procedure and the sedation                            options and risks were discussed with the patient.                            All questions were answered, and informed consent                            was obtained. Prior Anticoagulants: The patient has                            taken no previous anticoagulant or antiplatelet                            agents. ASA Grade Assessment: III - A patient with                            severe systemic disease. After reviewing the risks                            and benefits, the patient was deemed in                            satisfactory condition to undergo the procedure.  After obtaining informed consent, the colonoscope                            was passed under direct vision. Throughout the                            procedure, the patient's blood pressure, pulse, and                            oxygen saturations were monitored continuously. The                            Olympus CF-HQ190L (Serial# [id number]) Colonoscope was                            introduced through the  anus with the intention of                            advancing to the cecum. The scope was advanced to                            the splenic flexure before the procedure was                            aborted. Medications were given. The colonoscopy                            was performed without difficulty. The patient                            tolerated the procedure well. The quality of the                            bowel preparation was poor. The rectum was                            photographed. Scope In: 3:35:31 PM Scope Out: 3:37:49 PM Total Procedure Duration: 0 hours 2 minutes 18 seconds  Findings:                 Poor prep limited this examination.                           Internal hemorrhoids were found. The hemorrhoids                            were small. Complications:            No immediate complications. Estimated blood loss:                            None. Estimated Blood Loss:     Estimated blood loss: none. Impression:               - Preparation of the colon was poor and so this was  an INCOMPLETE examination.                           - Small internal hemorrhoids. Recommendation:           - Patient has a contact number available for                            emergencies. The signs and symptoms of potential                            delayed complications were discussed with the                            patient. Return to normal activities tomorrow.                            Written discharge instructions were provided to the                            patient.                           - Repeat colonoscopy with DOUBLE PREP PROTOCOL at                            the next available appointment because the bowel                            preparation was poor. Milus Banister, MD 08/08/2020 3:42:50 PM This report has been signed electronically.

## 2020-08-08 NOTE — Progress Notes (Signed)
VS- [facility]  Cell phone off per pt

## 2020-08-08 NOTE — Patient Instructions (Signed)
Handout re: hemorrhoids provided Repeat colonoscopy and perform a two day prep  YOU HAD AN ENDOSCOPIC PROCEDURE TODAY AT [facility]:   Refer to the procedure report that was given to you for any specific questions about what was found during the examination.  If the procedure report does not answer your questions, please call your gastroenterologist to clarify.  If you requested that your care partner not be given the details of your procedure findings, then the procedure report has been included in a sealed envelope for you to review at your convenience later.  YOU SHOULD EXPECT: Some feelings of bloating in the abdomen. Passage of more gas than usual.  Walking can help get rid of the air that was put into your GI tract during the procedure and reduce the bloating. If you had a lower endoscopy (such as a colonoscopy or flexible sigmoidoscopy) you may notice spotting of blood in your stool or on the toilet paper. If you underwent a bowel prep for your procedure, you may not have a normal bowel movement for a few days.  Please Note:  You might notice some irritation and congestion in your nose or some drainage.  This is from the oxygen used during your procedure.  There is no need for concern and it should clear up in a day or so.  SYMPTOMS TO REPORT IMMEDIATELY:   Following lower endoscopy (colonoscopy or flexible sigmoidoscopy):  Excessive amounts of blood in the stool  Significant tenderness or worsening of abdominal pains  Swelling of the abdomen that is new, acute  Fever of 100F or higher  For urgent or emergent issues, a gastroenterologist can be reached at any hour by calling [phone]. Do not use MyChart messaging for urgent concerns.   DIET:  We do recommend a small meal at first, but then you may proceed to your regular diet.  Drink plenty of fluids but you should avoid alcoholic beverages for 24 hours.  ACTIVITY:  You should plan to take it easy for the rest  of today and you should NOT DRIVE or use heavy machinery until tomorrow (because of the sedation medicines used during the test).    FOLLOW UP: Our staff will call the number listed on your records 48-72 hours following your procedure to check on you and address any questions or concerns that you may have regarding the information given to you following your procedure. If we do not reach you, we will leave a message.  We will attempt to reach you two times.  During this call, we will ask if you have developed any symptoms of COVID 19. If you develop any symptoms (ie: fever, flu-like symptoms, shortness of breath, cough etc.) before then, please call [phone].  If you test positive for Covid 19 in the 2 weeks post procedure, please call and report this information to [place].    If any biopsies were taken you will be contacted by phone or by letter within the next 1-3 weeks.  Please call us at [phone] if you have not heard about the biopsies in 3 weeks.   SIGNATURES/CONFIDENTIALITY: You and/or your care partner have signed paperwork which will be entered into your electronic medical record.  These signatures attest to the fact that that the information above on your After Visit Summary has been reviewed and is understood.  Full responsibility of the confidentiality of this discharge information lies with you and/or your care-partner.

## 2020-08-10 ENCOUNTER — Telehealth: Payer: Self-pay

## 2020-08-10 NOTE — Telephone Encounter (Signed)
Started in error

## 2020-08-10 NOTE — Telephone Encounter (Signed)
No answer, unable to leave a message, B.Ieasha Boerema RN. 

## 2020-08-10 NOTE — Telephone Encounter (Signed)
  Follow up Call-  Call back number 08/08/2020  Post procedure Call Back phone  # [phone]  Permission to leave phone message Yes  Some recent data might be hidden     Patient questions:  Do you have a fever, pain , or abdominal swelling? No. Pain Score  0 *  Have you tolerated food without any problems? Yes.    Have you been able to return to your normal activities? Yes.    Do you have any questions about your discharge instructions: Diet   No. Medications  No. Follow up visit  No.  Do you have questions or concerns about your Care? Yes.    Actions: * If pain score is 4 or above: No action needed, pain <4.  1. Have you developed a fever since your procedure? no  2.   Have you had an respiratory symptoms (SOB or cough) since your procedure? no  3.   Have you tested positive for COVID 19 since your procedure no  4.   Have you had any family members/close contacts diagnosed with the COVID 19 since your procedure?  no   If yes to any of these questions please route to Joylene John, RN and Joella Prince, RN

## 2020-08-16 ENCOUNTER — Ambulatory Visit (AMBULATORY_SURGERY_CENTER): Payer: 59

## 2020-08-16 ENCOUNTER — Other Ambulatory Visit: Payer: Self-pay

## 2020-08-16 ENCOUNTER — Other Ambulatory Visit (HOSPITAL_COMMUNITY): Payer: Self-pay

## 2020-08-16 VITALS — Ht 63.0 in | Wt 270.0 lb

## 2020-08-16 DIAGNOSIS — K625 Hemorrhage of anus and rectum: Secondary | ICD-10-CM

## 2020-08-16 MED ORDER — NA SULFATE-K SULFATE-MG SULF 17.5-3.13-1.6 GM/177ML PO SOLN
1.0000 | Freq: Once | ORAL | 0 refills | Status: AC
Start: 2020-08-16 — End: 2020-08-17
  Filled 2020-08-16: qty 354, 1d supply, fill #0

## 2020-08-16 NOTE — Progress Notes (Signed)
Pt verified name, DOB, address and insurance during PV today.   Pt mailed instruction packet copy of consent form to read and not return, and instructions. . PV completed over the phone. Pt encouraged to call with questions or issues.   No allergies to soy or egg Pt is not on blood thinners or diet pills Denies issues with sedation/intubation Denies atrial flutter/fib Denies constipation   Emmi instructions given to pt  Pt is aware of Covid safety and care partner requirements.     

## 2020-08-18 ENCOUNTER — Other Ambulatory Visit (HOSPITAL_COMMUNITY): Payer: Self-pay

## 2020-08-22 ENCOUNTER — Ambulatory Visit (AMBULATORY_SURGERY_CENTER): Payer: 59 | Admitting: Gastroenterology

## 2020-08-22 ENCOUNTER — Other Ambulatory Visit: Payer: Self-pay | Admitting: Gastroenterology

## 2020-08-22 ENCOUNTER — Encounter: Payer: Self-pay | Admitting: Gastroenterology

## 2020-08-22 ENCOUNTER — Other Ambulatory Visit: Payer: Self-pay

## 2020-08-22 VITALS — BP 148/87 | HR 65 | Temp 97.8°F | Resp 18 | Ht 63.0 in | Wt 270.0 lb

## 2020-08-22 DIAGNOSIS — K635 Polyp of colon: Secondary | ICD-10-CM | POA: Diagnosis not present

## 2020-08-22 DIAGNOSIS — D124 Benign neoplasm of descending colon: Secondary | ICD-10-CM

## 2020-08-22 DIAGNOSIS — K625 Hemorrhage of anus and rectum: Secondary | ICD-10-CM

## 2020-08-22 DIAGNOSIS — J45909 Unspecified asthma, uncomplicated: Secondary | ICD-10-CM | POA: Diagnosis not present

## 2020-08-22 DIAGNOSIS — K648 Other hemorrhoids: Secondary | ICD-10-CM

## 2020-08-22 MED ORDER — SODIUM CHLORIDE 0.9 % IV SOLN: 500.0000 mL | Freq: Once | INTRAVENOUS | Status: DC

## 2020-08-22 NOTE — Progress Notes (Signed)
Called to room to assist during endoscopic procedure.  Patient ID and intended procedure confirmed with present staff. Received instructions for my participation in the procedure from the performing physician.  

## 2020-08-22 NOTE — Patient Instructions (Signed)
HANDOUTS PROVIDED ON: POLYPS & HEMORRHOIDS  The polyp removed today have been sent for pathology.  The results can take 1-3 weeks to receive.  When your next colonoscopy should occur will be based on the pathology results.    You may resume your previous diet and medication schedule.  Thank you for allowing [place] to care for you today!!!   YOU HAD AN ENDOSCOPIC PROCEDURE TODAY AT [facility] [facility]:   Refer to the procedure report that was given to you for any specific questions about what was found during the examination.  If the procedure report does not answer your questions, please call your gastroenterologist to clarify.  If you requested that your care partner not be given the details of your procedure findings, then the procedure report has been included in a sealed envelope for you to review at your convenience later.  YOU SHOULD EXPECT: Some feelings of bloating in the abdomen. Passage of more gas than usual.  Walking can help get rid of the air that was put into your GI tract during the procedure and reduce the bloating. If you had a lower endoscopy (such as a colonoscopy or flexible sigmoidoscopy) you may notice spotting of blood in your stool or on the toilet paper. If you underwent a bowel prep for your procedure, you may not have a normal bowel movement for a few days.  Please Note:  You might notice some irritation and congestion in your nose or some drainage.  This is from the oxygen used during your procedure.  There is no need for concern and it should clear up in a day or so.  SYMPTOMS TO REPORT IMMEDIATELY:   Following lower endoscopy (colonoscopy or flexible sigmoidoscopy):  Excessive amounts of blood in the stool  Significant tenderness or worsening of abdominal pains  Swelling of the abdomen that is new, acute  Fever of 100F or higher  For urgent or emergent issues, a gastroenterologist can be reached at any hour by calling [phone]. Do not use MyChart  messaging for urgent concerns.    DIET:  We do recommend a small meal at first, but then you may proceed to your regular diet.  Drink plenty of fluids but you should avoid alcoholic beverages for 24 hours.  ACTIVITY:  You should plan to take it easy for the rest of today and you should NOT DRIVE or use heavy machinery until tomorrow (because of the sedation medicines used during the test).    FOLLOW UP: Our staff will call the number listed on your records Wednesday morning between 7:15 am and 8:15 am following your procedure to check on you and address any questions or concerns that you may have regarding the information given to you following your procedure. If we do not reach you, we will leave a message.  We will attempt to reach you two times.  During this call, we will ask if you have developed any symptoms of COVID 19. If you develop any symptoms (ie: fever, flu-like symptoms, shortness of breath, cough etc.) before then, please call [phone].  If you test positive for Covid 19 in the 2 weeks post procedure, please call and report this information to [place].    If any biopsies were taken you will be contacted by phone or by letter within the next 1-3 weeks.  Please call us at [phone] if you have not heard about the biopsies in 3 weeks.    SIGNATURES/CONFIDENTIALITY: You and/or your care partner  have signed paperwork which will be entered into your electronic medical record.  These signatures attest to the fact that that the information above on your After Visit Summary has been reviewed and is understood.  Full responsibility of the confidentiality of this discharge information lies with you and/or your care-partner.

## 2020-08-22 NOTE — Progress Notes (Signed)
Report to PACU, RN, vss, BBS= Clear.  

## 2020-08-22 NOTE — Progress Notes (Signed)
VS by CW  I have reviewed the patient's medical history in detail and updated the computerized patient record.  

## 2020-08-22 NOTE — Op Note (Signed)
[facility] Patient Name: [name] Procedure Date: 08/22/2020 1:44 PM MRN: [id number] Endoscopist: Milus Banister , MD Age: 55 Referring MD:  Date of Birth: [birth year] Gender: Female Account #: [id number] Procedure:                Colonoscopy Indications:              Hematochezia Medicines:                Monitored Anesthesia Care Procedure:                Pre-Anesthesia Assessment:                           - Prior to the procedure, a History and Physical                            was performed, and patient medications and                            allergies were reviewed. The patient's tolerance of                            previous anesthesia was also reviewed. The risks                            and benefits of the procedure and the sedation                            options and risks were discussed with the patient.                            All questions were answered, and informed consent                            was obtained. Prior Anticoagulants: The patient has                            taken no previous anticoagulant or antiplatelet                            agents. ASA Grade Assessment: III - A patient with                            severe systemic disease. After reviewing the risks                            and benefits, the patient was deemed in                            satisfactory condition to undergo the procedure.                           After obtaining informed consent, the colonoscope  was passed under direct vision. Throughout the                            procedure, the patient's blood pressure, pulse, and                            oxygen saturations were monitored continuously. The                            Olympus CF-HQ190L [id number]) Colonoscope was                            introduced through the anus and advanced to the the                            cecum, identified by appendiceal orifice and                             ileocecal valve. The colonoscopy was performed                            without difficulty. The patient tolerated the                            procedure well. The quality of the bowel                            preparation was good. The ileocecal valve,                            appendiceal orifice, and rectum were photographed. Scope In: 1:51:17 PM Scope Out: 2:03:48 PM Scope Withdrawal Time: 0 hours 10 minutes 14 seconds  Total Procedure Duration: 0 hours 12 minutes 31 seconds  Findings:                 A 3 mm polyp was found in the descending colon. The                            polyp was sessile. The polyp was removed with a                            cold snare. Resection and retrieval were complete.                           Internal hemorrhoids were found. The hemorrhoids                            were small.                           The exam was otherwise without abnormality on                            direct and retroflexion views. Complications:  No immediate complications. Estimated blood loss:                            None. Estimated Blood Loss:     Estimated blood loss: none. Impression:               - One 3 mm polyp in the descending colon, removed                            with a cold snare. Resected and retrieved.                           - Small internal hemorrhoids.                           - The examination was otherwise normal on direct                            and retroflexion views. Recommendation:           - Patient has a contact number available for                            emergencies. The signs and symptoms of potential                            delayed complications were discussed with the                            patient. Return to normal activities tomorrow.                            Written discharge instructions were provided to the                            patient.                           -  Resume previous diet.                           - Continue present medications.                           - Await pathology results. Milus Banister, MD 08/22/2020 2:06:38 PM This report has been signed electronically.

## 2020-08-23 ENCOUNTER — Other Ambulatory Visit (HOSPITAL_COMMUNITY): Payer: Self-pay

## 2020-08-24 ENCOUNTER — Telehealth: Payer: Self-pay

## 2020-08-24 NOTE — Telephone Encounter (Signed)
  Follow up Call-  Call back number 08/22/2020 08/08/2020  Post procedure Call Back phone  # [phone]  Permission to leave phone message Yes Yes  Some recent data might be hidden     Patient questions:  Do you have a fever, pain , or abdominal swelling? No. Pain Score  0 *  Have you tolerated food without any problems? Yes.    Have you been able to return to your normal activities? Yes.    Do you have any questions about your discharge instructions: Diet   No. Medications  No. Follow up visit  No.  Do you have questions or concerns about your Care? No.  Actions: * If pain score is 4 or above: No action needed, pain <4. 1. Have you developed a fever since your procedure? no  2.   Have you had an respiratory symptoms (SOB or cough) since your procedure? no  3.   Have you tested positive for COVID 19 since your procedure no  4.   Have you had any family members/close contacts diagnosed with the COVID 19 since your procedure?  no   If yes to any of these questions please route to Joylene John, RN and Joella Prince, RN

## 2020-09-02 ENCOUNTER — Encounter: Payer: Self-pay | Admitting: Gastroenterology

## 2020-09-16 ENCOUNTER — Telehealth: Payer: Self-pay | Admitting: Internal Medicine

## 2020-09-16 ENCOUNTER — Other Ambulatory Visit (HOSPITAL_COMMUNITY): Payer: Self-pay

## 2020-09-16 ENCOUNTER — Other Ambulatory Visit: Payer: Self-pay | Admitting: Internal Medicine

## 2020-09-16 ENCOUNTER — Telehealth: Payer: Self-pay

## 2020-09-16 ENCOUNTER — Other Ambulatory Visit: Payer: Self-pay | Admitting: Pulmonary Disease

## 2020-09-16 MED ORDER — METOPROLOL SUCCINATE ER 25 MG PO TB24
25.0000 mg | ORAL_TABLET | Freq: Every day | ORAL | 5 refills | Status: DC
  Filled 2020-09-16: qty 30, 30d supply, fill #0
  Filled 2020-10-28: qty 30, 30d supply, fill #1

## 2020-09-16 MED ORDER — HYDROCOD POLST-CPM POLST ER 10-8 MG/5ML PO SUER
5.0000 mL | Freq: Two times a day (BID) | ORAL | 0 refills | Status: DC | PRN
  Filled 2020-09-16: qty 115, 12d supply, fill #0

## 2020-09-16 MED ORDER — DOXYCYCLINE HYCLATE 100 MG PO TABS
100.0000 mg | ORAL_TABLET | Freq: Two times a day (BID) | ORAL | 0 refills | Status: DC
  Filled 2020-09-16: qty 14, 7d supply, fill #0

## 2020-09-16 MED ORDER — PREDNISONE 10 MG PO TABS
20.0000 mg | ORAL_TABLET | Freq: Every day | ORAL | 0 refills | Status: DC
  Filled 2020-09-16: qty 14, 7d supply, fill #0

## 2020-09-16 MED FILL — Lorazepam Tab 1 MG: ORAL | 10 days supply | Qty: 30 | Fill #0 | Status: AC

## 2020-09-16 NOTE — Telephone Encounter (Signed)
Pt requesting Tussionex 10-8mg   Would you like to refill this ?

## 2020-09-16 NOTE — Telephone Encounter (Signed)
Can't do rx without ov or at least televisit.  Ok to add on to the pm schedule

## 2020-09-16 NOTE — Telephone Encounter (Signed)
Spoke to patient, who feels that she has developed an asthma flare.  C/o prod cough with thick clear sputum, wheezing and increased sob x2d. Denied fever, chills or sweats. Peak flow has decreased 400 to 325. using albuterol nebs Q4H and symbicort BID.  Fully vaccinated against covid. Unable to get flu shot.  She is requesting Rx for zpak, prednisone and tussionex.  Dr. Annamaria Boots, please advise. Thanks  Current Outpatient Medications on File Prior to Visit  Medication Sig Dispense Refill  . albuterol (PROVENTIL) (2.5 MG/3ML) 0.083% nebulizer solution USE 1 VIAL VIA NEBULIZER EVERY 6 HOURS AS NEEDED FOR WHEEZING OR SHORTNESS OF BREATH (Patient taking differently: Take 2.5 mg by nebulization every 6 (six) hours as needed for wheezing or shortness of breath.) 90 mL 12  . amLODipine (NORVASC) 10 MG tablet TAKE 1 TABLET BY MOUTH ONCE A DAY 30 tablet 5  . Ascorbic Acid (VITAMIN C) 500 MG CAPS Take by mouth daily.    . Azelastine-Fluticasone (DYMISTA) 137-50 MCG/ACT SUSP One puff twice daily ea nostril (Patient taking differently: Place 1 puff into both nostrils 2 (two) times daily.) 23 g 5  . benzonatate (TESSALON) 200 MG capsule Take 1 capsule (200 mg total) by mouth 3 (three) times daily as needed for cough. 30 capsule 1  . budesonide-formoterol (SYMBICORT) 160-4.5 MCG/ACT inhaler INHALE 2 PUFFS BY MOUTH 2 TIMES DAILY 10.2 g 9  . Cholecalciferol (VITAMIN D-3) 125 MCG (5000 UT) TABS Take by mouth daily.    . cyclobenzaprine (FLEXERIL) 5 MG tablet TAKE 1 TABLET BY MOUTH 3 TIMES DAILY AS NEEDED 90 tablet 1  . dextromethorphan-guaiFENesin (MUCINEX DM) 30-600 MG 12hr tablet Take 1 tablet by mouth 2 (two) times daily. (Patient taking differently: Take 1 tablet by mouth as needed.) 10 tablet 0  . econazole nitrate 1 % cream Apply 1 application topically daily as needed.   2  . EPINEPHRINE 0.3 mg/0.3 mL IJ SOAJ injection INJECT INTO THIGH FOR SEVERE ASTHMA/ ALLERGIC REACTION 2 each 12  . furosemide (LASIX) 40  MG tablet TAKE 1 TABLET BY MOUTH ONCE DAILY AS NEEDED (Patient taking differently: Take 40 mg by mouth daily as needed for fluid.) 30 tablet 5  . ibuprofen (ADVIL) 800 MG tablet TAKE 1 TABLET BY MOUTH 3 TIMES DAILY AS NEEDED 90 tablet 1  . levalbuterol (XOPENEX HFA) 45 MCG/ACT inhaler INHALE 2 PUFFS BY MOUTH INTO THE LUNGS EVERY 4 HOURS 15 g 9  . levocetirizine (XYZAL) 5 MG tablet Take 1 tablet (5 mg total) by mouth every evening. 30 tablet 5  . LORazepam (ATIVAN) 1 MG tablet TAKE 1 TABLET BY MOUTH EVERY 8 HOURS AS NEEDED FOR ANXIETY 30 tablet 5  . metoprolol succinate (TOPROL-XL) 25 MG 24 hr tablet Take 25 mg by mouth daily.    Marland Kitchen olopatadine (PATANOL) 0.1 % ophthalmic solution INSTILL 1 DROP INTO BOTH EYES TWO TIMES DAILY AS NEEDED FOR ALLERGIES (Patient taking differently: Place 1 drop into both eyes 2 (two) times daily as needed for allergies.) 5 mL 5  . Spacer/Aero-Holding Chambers (AEROCHAMBER MV) inhaler Use as instructed 1 each 0  . traMADol (ULTRAM) 50 MG tablet TAKE 1 TABLET BY MOUTH EVERY 4 HOURS AS NEEDED FOR PAIN 30 tablet 5  . TRANSDERM-SCOP, 1.5 MG, 1 MG/3DAYS APPLY 1 PATCH ONTO THE SKIN EVERY 3 DAYS. (Patient not taking: Reported on 08/16/2020) 10 patch 12  . urea (CARMOL) 40 % CREA Apply 1 application topically daily as needed.   2  . VENTOLIN HFA 108 (  90 Base) MCG/ACT inhaler INHALE 2 PUFFS BY MOUTH INTO THE LUNGS EVERY 6 HOURS AS NEEDED FOR WHEEZING (Patient taking differently: Inhale 2 puffs into the lungs every 6 (six) hours as needed for wheezing or shortness of breath.) 18 g 12  . vitamin E 1000 UNIT capsule Take 1,000 Units by mouth daily.    Penne Lash 1.25 MG/0.5ML nebulizer solution     . zinc gluconate 50 MG tablet Take 50 mg by mouth at bedtime.     No current facility-administered medications on file prior to visit.    Allergies  Allergen Reactions  . Influenza A (H1n1) Monoval Vac     Reaction: systemic reaction  . Omalizumab     Arvid Right* REACTION:  angioedema-looses airway  . Promethazine Hcl     REACTION: hallucinations with too high of dose  . Topiramate

## 2020-09-16 NOTE — Telephone Encounter (Signed)
Course of antibiotics-doxycycline 100 p.o. twice daily for 7 days Course of steroids-prednisone 20 p.o. daily for 7 days  Call back if no improvement

## 2020-09-16 NOTE — Telephone Encounter (Signed)
ATC patient- unable to leave vm due to mailbox being full.   

## 2020-09-16 NOTE — Progress Notes (Signed)
Prednisone and doxycycline called in

## 2020-09-16 NOTE — Telephone Encounter (Signed)
ATC pt in regards to setting up an appointment with Dr Melvyn Novas as we are unable to fill Tussionex without an office visit.

## 2020-09-16 NOTE — Telephone Encounter (Signed)
Forwarding to Dr Ander Slade

## 2020-09-16 NOTE — Telephone Encounter (Signed)
Patient is aware of below message and voiced her understanding.  Nothing further needed at this time.   

## 2020-09-16 NOTE — Telephone Encounter (Signed)
Sent in

## 2020-09-16 NOTE — Telephone Encounter (Signed)
Called and spoke with Patient.  Dr.Olalere's recommendations given.  Understanding stated.  Patient is requesting Tussionex refill to help with her cough.  Message routed to Dr.Olalere to advise on Tussionex

## 2020-09-18 ENCOUNTER — Telehealth: Payer: Self-pay | Admitting: Pulmonary Disease

## 2020-09-18 DIAGNOSIS — J4541 Moderate persistent asthma with (acute) exacerbation: Secondary | ICD-10-CM

## 2020-09-18 MED ORDER — SPIRIVA RESPIMAT 1.25 MCG/ACT IN AERS
2.0000 | INHALATION_SPRAY | Freq: Every day | RESPIRATORY_TRACT | 3 refills | Status: DC
  Filled 2020-09-20: qty 4, 30d supply, fill #0

## 2020-09-18 MED ORDER — MONTELUKAST SODIUM 10 MG PO TABS: 10.0000 mg | ORAL_TABLET | Freq: Every day | ORAL | 10 refills | Status: DC

## 2020-09-18 NOTE — Telephone Encounter (Signed)
Patient called in on 5/27 with increasing shortness of breath with concern for asthma exacerbation. She was provided prednisone 38m x 7 days and a course of doxycycline.   [facility] and she reports continued shortness of breath and reduction on her peak flow meter.   Instructed her to take the prednisone taper as follows: 426mx 3 days 308m 3 days 32m42m3 days 10mg65m days  She is to continue her symbicort 2 puffs twice daily. Will send in a prescription for spiriva 1.25mcg41muffs daily and montelukast 10mg d12m.   Please schedule her for follow up in the clinic this week if we have openings thanks.   [facility] Office: [phone] for personal pager PCCM on call pager [phone]. Please call Elink 7p-7a. [phone]

## 2020-09-20 ENCOUNTER — Other Ambulatory Visit (HOSPITAL_COMMUNITY): Payer: Self-pay

## 2020-09-20 NOTE — Telephone Encounter (Signed)
Called and spoke with patient, she is feeling some better since the prednisone, spiriva, symbicort and montelukast.  Scheduled her to see Geraldo Pitter NP on Friday at 9:30 am, advised to arrive by 9:15 for check in, she verbalized understanding.  Nothing further needed.

## 2020-09-22 NOTE — Progress Notes (Signed)
@Patient  ID: [name], female    DOB: 1966/03/08, 55 y.o.   MRN: [id number]  Chief Complaint  Patient presents with  . Follow-up    Pt states that she is feeling some better after the pred taper but states that she is not fully back to herself. Still has some SOB, wheezing, and an occ cough but is not coughing up any mucus now.    Referring provider: Benito Mccreedy, MD  HPI: 55 year old female, marked.  Past medical history significant for moderate persistent asthma, OSA, multiple lung nodules on CT, allergic rhinitis, hypertension, GERD, COVID-19 pneumonia, obesity.  Dr. Annamaria Boots, last seen in office on 05/11/2020.  Maintained on Symbicort 160, Spiriva, Xyzal, Dymista, Singulair and as needed albuterol nebulizer.  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 Had exacerbations in August and December. I raised possibility of exploring another Biologic. She had reacted to Xolair and wasn't interested at this time.  Still hasn't recovered sense of taste after Covid infection in 2020. Using neb 3-x/ day currently. Not using rescue hfa. Never really cleared after December flare. No fever, sinus trouble, etc, but continue to cough with green sputum. HRCT chest 10/29/19-  IMPRESSION: 1. Interval resolution of left upper lobe pulmonary nodules seen on prior examination, consistent with resolution of infection or inflammation. 2. Stable, definitively  benign 3 mm fissural nodule of the right lower lobe. No further routine CT follow-up is required. 3. No abnormality of the lungs to explain shortness of breath. No evidence of fibrotic interstitial lung disease.   09/23/2020- Interim history Patient presents today for 10-month follow-up.  Patient called our office on 5/27 with increased shortness of breath and concerns for acute asthma exacerbation.  She was sent in prescription for prednisone 20 mg x 7 days and course of oral doxycycline.  Dr. Erin Fulling sent an additional prednisone taper 40 mg x 3 days, 30 mg x 3 days, 20 mg x 3 days, 10 mg x 3 days.  She was also started on Spiriva 1.25 mcg 2 puffs daily montelukast 10 mg daily.  She does not feel 100% but she is getting better. She still has some shortness of breath and wheezing. Cough mainly dry without mucus production. She is currently on her second prednisone taper and on 30mg  prednisone. She is taking all medication as prescribed. She has a reactive dry cough with no mucus production. She tells me since covid dx in 2020 her asthma has been pretty well controlled. She still can not smell or taste. Normal peak flow 425, today her peak flow was 325.    Allergies  Allergen Reactions  . Influenza A (H1n1) Monoval Vac     Reaction: systemic reaction  . Omalizumab     Arvid Right* REACTION: angioedema-looses airway  . Promethazine Hcl     REACTION: hallucinations with too high of dose  . Topiramate     Immunization History  Administered Date(s) Administered  . Moderna SARS-COV2 Booster Vaccination 05/12/2020  . Moderna Sars-Covid-2 Vaccination 04/21/2019,  06/19/2019  . Pneumococcal Polysaccharide-23 05/24/2009  . Pneumococcal-Unspecified 07/06/2016    Past Medical History:  Diagnosis Date  . Allergic rhinitis   . Allergy   . Asthma    h/o intubation 2001  . COVID    November 2020  . Dysphagia    Dr. Ardis Hughs.  egd w/ dilatation 06/08/2007  . Esophageal dilatation   . GERD  (gastroesophageal reflux disease)   . Headache    hx migraines  . Hypertension in pregnancy    pregnancy induced htn  . Meniscal injury   . Morbid obesity with body mass index of 45.0-49.9 in adult [facility])   . Prolapsed internal hemorrhoids, grade 2 09/23/2017  . Pseudotumor cerebri    has required LP for release of pressure  . Steroid-induced hyperglycemia 05/08/2013    Tobacco History: Social History   Tobacco Use  Smoking Status Never Smoker  Smokeless Tobacco Never Used   Counseling given: Not Answered   Outpatient Medications Prior to Visit  Medication Sig Dispense Refill  . albuterol (PROVENTIL) (2.5 MG/3ML) 0.083% nebulizer solution USE 1 VIAL VIA NEBULIZER EVERY 6 HOURS AS NEEDED FOR WHEEZING OR SHORTNESS OF BREATH (Patient taking differently: Take 2.5 mg by nebulization every 6 (six) hours as needed for wheezing or shortness of breath.) 90 mL 12  . amLODipine (NORVASC) 10 MG tablet TAKE 1 TABLET BY MOUTH ONCE A DAY 30 tablet 5  . Ascorbic Acid (VITAMIN C) 500 MG CAPS Take by mouth daily.    . Azelastine-Fluticasone (DYMISTA) 137-50 MCG/ACT SUSP One puff twice daily ea nostril (Patient taking differently: Place 1 puff into both nostrils 2 (two) times daily.) 23 g 5  . benzonatate (TESSALON) 200 MG capsule Take 1 capsule (200 mg total) by mouth 3 (three) times daily as needed for cough. 30 capsule 1  . budesonide-formoterol (SYMBICORT) 160-4.5 MCG/ACT inhaler INHALE 2 PUFFS BY MOUTH 2 TIMES DAILY 10.2 g 9  . chlorpheniramine-HYDROcodone (TUSSIONEX PENNKINETIC ER) 10-8 MG/5ML SUER Take 5 mLs by mouth every 12 (twelve) hours as needed for cough. 115 mL 0  . Cholecalciferol (VITAMIN D-3) 125 MCG (5000 UT) TABS Take by mouth daily.    . cyclobenzaprine (FLEXERIL) 5 MG tablet TAKE 1 TABLET BY MOUTH 3 TIMES DAILY AS NEEDED 90 tablet 1  . dextromethorphan-guaiFENesin (MUCINEX DM) 30-600 MG 12hr tablet Take 1 tablet by mouth 2 (two) times daily. (Patient taking differently: Take 1 tablet  by mouth as needed.) 10 tablet 0  . doxycycline (VIBRA-TABS) 100 MG tablet Take 1 tablet (100 mg total) by mouth 2 (two) times daily. 14 tablet 0  . econazole nitrate 1 % cream Apply 1 application topically daily as needed.   2  . EPINEPHRINE 0.3 mg/0.3 mL IJ SOAJ injection INJECT INTO THIGH FOR SEVERE ASTHMA/ ALLERGIC REACTION 2 each 12  . furosemide (LASIX) 40 MG tablet TAKE 1 TABLET BY MOUTH ONCE DAILY AS NEEDED (Patient taking differently: Take 40 mg by mouth daily as needed for fluid.) 30 tablet 5  . ibuprofen (ADVIL) 800 MG tablet TAKE 1 TABLET BY MOUTH 3 TIMES DAILY AS NEEDED 90 tablet 1  . levocetirizine (XYZAL) 5 MG tablet Take 1 tablet (5 mg total) by mouth every evening. 30 tablet 5  . LORazepam (ATIVAN) 1 MG tablet TAKE 1 TABLET BY MOUTH EVERY 8 HOURS AS NEEDED FOR ANXIETY 30 tablet 5  . metoprolol succinate (TOPROL-XL) 25 MG 24 hr tablet Take 1 tablet (25 mg total) by mouth daily. 30 tablet 5  . montelukast (  SINGULAIR) 10 MG tablet Take 1 tablet (10 mg total) by mouth at bedtime. 30 tablet 10  . olopatadine (PATANOL) 0.1 % ophthalmic solution INSTILL 1 DROP INTO BOTH EYES TWO TIMES DAILY AS NEEDED FOR ALLERGIES (Patient taking differently: Place 1 drop into both eyes 2 (two) times daily as needed for allergies.) 5 mL 5  . predniSONE (DELTASONE) 10 MG tablet Take 2 tablets (20 mg total) by mouth daily with breakfast. 14 tablet 0  . Spacer/Aero-Holding Chambers (AEROCHAMBER MV) inhaler Use as instructed 1 each 0  . Tiotropium Bromide Monohydrate (SPIRIVA RESPIMAT) 1.25 MCG/ACT AERS Inhale 2 puffs into the lungs daily. 4 g 3  . traMADol (ULTRAM) 50 MG tablet TAKE 1 TABLET BY MOUTH EVERY 4 HOURS AS NEEDED FOR PAIN 30 tablet 5  . urea (CARMOL) 40 % CREA Apply 1 application topically daily as needed.   2  . VENTOLIN HFA 108 (90 Base) MCG/ACT inhaler INHALE 2 PUFFS BY MOUTH INTO THE LUNGS EVERY 6 HOURS AS NEEDED FOR WHEEZING (Patient taking differently: Inhale 2 puffs into the lungs every 6  (six) hours as needed for wheezing or shortness of breath.) 18 g 12  . vitamin E 1000 UNIT capsule Take 1,000 Units by mouth daily.    Penne Lash 1.25 MG/0.5ML nebulizer solution     . zinc gluconate 50 MG tablet Take 50 mg by mouth at bedtime.    . TRANSDERM-SCOP, 1.5 MG, 1 MG/3DAYS APPLY 1 PATCH ONTO THE SKIN EVERY 3 DAYS. (Patient not taking: No sig reported) 10 patch 12  . levalbuterol (XOPENEX HFA) 45 MCG/ACT inhaler INHALE 2 PUFFS BY MOUTH INTO THE LUNGS EVERY 4 HOURS 15 g 9  . metoprolol succinate (TOPROL-XL) 25 MG 24 hr tablet Take 25 mg by mouth daily.     No facility-administered medications prior to visit.    Review of Systems  Review of Systems  Constitutional: Negative.   HENT: Negative.   Respiratory: Positive for cough, shortness of breath and wheezing.   Cardiovascular: Negative for chest pain, palpitations and leg swelling.    Physical Exam  BP 132/90 (BP Location: Right Wrist, Patient Position: Sitting, Cuff Size: Normal)   Pulse 61   Ht 5\' 3"  (1.6 m)   SpO2 97% Comment: RA  BMI 47.83 kg/m  Physical Exam Constitutional:      Appearance: Normal appearance.  HENT:     Head: Normocephalic and atraumatic.     Mouth/Throat:     Mouth: Mucous membranes are moist.     Pharynx: Oropharynx is clear.  Cardiovascular:     Rate and Rhythm: Normal rate and regular rhythm.  Pulmonary:     Effort: Pulmonary effort is normal.     Breath sounds: Normal breath sounds. No wheezing.     Comments: CTA, reactive cough  Skin:    General: Skin is warm and dry.  Neurological:     General: No focal deficit present.     Mental Status: She is alert and oriented to person, place, and time. Mental status is at baseline.  Psychiatric:        Mood and Affect: Mood normal.        Behavior: Behavior normal.        Thought Content: Thought content normal.        Judgment: Judgment normal.      Lab Results:  CBC    Component Value Date/Time   WBC 8.1 03/16/2019 1834   RBC  4.65 03/16/2019 1834   HGB  12.0 03/16/2019 1834   HCT 38.6 03/16/2019 1834   PLT 344 03/16/2019 1834   MCV 83.0 03/16/2019 1834   MCH 25.8 (L) 03/16/2019 1834   MCHC 31.1 03/16/2019 1834   RDW 13.5 03/16/2019 1834   LYMPHSABS 3.5 03/16/2019 1834   MONOABS 0.7 03/16/2019 1834   EOSABS 0.3 03/16/2019 1834   BASOSABS 0.0 03/16/2019 1834    BMET    Component Value Date/Time   NA 139 03/27/2019 1024   K 3.6 03/27/2019 1024   CL 102 03/27/2019 1024   CO2 29 03/27/2019 1024   GLUCOSE 117 (H) 03/27/2019 1024   BUN 16 03/27/2019 1024   CREATININE 1.14 03/27/2019 1024   CALCIUM 9.3 03/27/2019 1024   GFRNONAA 46 (L) 03/16/2019 1834   GFRAA 53 (L) 03/16/2019 1834    BNP    Component Value Date/Time   BNP 95.6 05/02/2016 1747    ProBNP    Component Value Date/Time   PROBNP <30.0 04/30/2009 2135    Imaging: No results found.   Assessment & Plan:   Moderate persistent asthma with exacerbation - Since Covid-dx in 2020 her asthma has been pretty well controlled. This last exacerbation has taken awhile for her to get over. She feels pollen exacerbated her symptoms. She completed Doxycycline course and is currently on her second prednisone taper. She is slowly improving. She feels some better but not 100%. Still has residual shortness of breath and wheezing. Rare cough. SABA 3-4 times a day. Peak flow 325 (normal 425). Since Covid-dx in 2020 her asthma has been pretty well controlled. This last exacerbation has taken awhile to get over.  - Continue Symbicort 160 + Spiriva Respimat 1.18mcg, Xyzal, Singular, Dymista nasal spray - Continue prednisone taper until complete (if she needs refill prednisone or depo-medrol she can call our office) - If patient continues to have recurrent asthma exacerbations would recommend checking allergy panel and CBC with differential off oral steroids and CXR. Consider starting biologic, she can discuss with Dr. 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

## 2020-09-23 ENCOUNTER — Ambulatory Visit (INDEPENDENT_AMBULATORY_CARE_PROVIDER_SITE_OTHER): Payer: 59 | Admitting: Primary Care

## 2020-09-23 ENCOUNTER — Other Ambulatory Visit: Payer: Self-pay

## 2020-09-23 ENCOUNTER — Encounter: Payer: Self-pay | Admitting: Primary Care

## 2020-09-23 VITALS — BP 132/90 | HR 61 | Ht 63.0 in

## 2020-09-23 DIAGNOSIS — R438 Other disturbances of smell and taste: Secondary | ICD-10-CM | POA: Insufficient documentation

## 2020-09-23 DIAGNOSIS — U099 Post covid-19 condition, unspecified: Secondary | ICD-10-CM | POA: Insufficient documentation

## 2020-09-23 DIAGNOSIS — R439 Unspecified disturbances of smell and taste: Secondary | ICD-10-CM

## 2020-09-23 DIAGNOSIS — J4541 Moderate persistent asthma with (acute) exacerbation: Secondary | ICD-10-CM

## 2020-09-23 NOTE — Patient Instructions (Addendum)
   Asthma: - Continue Symbicort 160 two puffs morning and evening - Continue Spiriva Respimat 1.52mcg two puffs in morning - Use Albuterol rescue inhaler every 6 hours for breakthrough shortness of breath/wheezing - Continue Xyzal 5mg  and Singulair 10mg  at bedtime - Continue Dymista nasal spray  - Take Mucinex 600mg  twice daily for chest congestion  - Continue Prednisone taper as prescribed  Recommendations:  - Next time your asthma symptoms flare please call our office to see if we can get you in for Depo-medrol shot  - If asthma symptoms flare more than 2-3 times a year requiring oral steriods may want to consider biologic such as fasenra or dupixent (can discuss with Dr. 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

## 2020-09-23 NOTE — Assessment & Plan Note (Addendum)
-   Since Covid-dx in 2020 her asthma has been pretty well controlled. This last exacerbation has taken awhile for her to get over. She feels pollen exacerbated her symptoms. She completed Doxycycline course and is currently on her second prednisone taper. She is slowly improving. She feels some better but not 100%. Still has residual shortness of breath and wheezing. Rare cough. SABA 3-4 times a day. Peak flow 325 (normal 425). Since Covid-dx in 2020 her asthma has been pretty well controlled. This last exacerbation has taken awhile to get over.  - Continue Symbicort 160 + Spiriva Respimat 1.81mcg, Xyzal, Singular, Dymista nasal spray - Continue prednisone taper until complete (if she needs refill prednisone or depo-medrol she can call our office) - If patient continues to have recurrent asthma exacerbations would recommend checking allergy panel and CBC with differential off oral steroids and CXR. Consider starting biologic, she can discuss with Dr. Annamaria Boots at follow-up in 3 months

## 2020-09-23 NOTE — Assessment & Plan Note (Signed)
-   Refer to [facility]

## 2020-09-27 ENCOUNTER — Other Ambulatory Visit (HOSPITAL_COMMUNITY): Payer: Self-pay

## 2020-09-30 DIAGNOSIS — N898 Other specified noninflammatory disorders of vagina: Secondary | ICD-10-CM | POA: Diagnosis not present

## 2020-09-30 DIAGNOSIS — Z1231 Encounter for screening mammogram for malignant neoplasm of breast: Secondary | ICD-10-CM | POA: Diagnosis not present

## 2020-09-30 DIAGNOSIS — Z6841 Body Mass Index (BMI) 40.0 and over, adult: Secondary | ICD-10-CM | POA: Diagnosis not present

## 2020-09-30 DIAGNOSIS — N951 Menopausal and female climacteric states: Secondary | ICD-10-CM | POA: Diagnosis not present

## 2020-09-30 DIAGNOSIS — Z01419 Encounter for gynecological examination (general) (routine) without abnormal findings: Secondary | ICD-10-CM | POA: Diagnosis not present

## 2020-10-13 ENCOUNTER — Other Ambulatory Visit (HOSPITAL_COMMUNITY): Payer: Self-pay

## 2020-10-13 DIAGNOSIS — Z0001 Encounter for general adult medical examination with abnormal findings: Secondary | ICD-10-CM | POA: Diagnosis not present

## 2020-10-13 DIAGNOSIS — R7303 Prediabetes: Secondary | ICD-10-CM | POA: Diagnosis not present

## 2020-10-13 DIAGNOSIS — Z131 Encounter for screening for diabetes mellitus: Secondary | ICD-10-CM | POA: Diagnosis not present

## 2020-10-13 DIAGNOSIS — E782 Mixed hyperlipidemia: Secondary | ICD-10-CM | POA: Diagnosis not present

## 2020-10-13 DIAGNOSIS — Z1329 Encounter for screening for other suspected endocrine disorder: Secondary | ICD-10-CM | POA: Diagnosis not present

## 2020-10-13 DIAGNOSIS — J4540 Moderate persistent asthma, uncomplicated: Secondary | ICD-10-CM | POA: Diagnosis not present

## 2020-10-13 DIAGNOSIS — I10 Essential (primary) hypertension: Secondary | ICD-10-CM | POA: Diagnosis not present

## 2020-10-13 DIAGNOSIS — K219 Gastro-esophageal reflux disease without esophagitis: Secondary | ICD-10-CM | POA: Diagnosis not present

## 2020-10-13 DIAGNOSIS — Z136 Encounter for screening for cardiovascular disorders: Secondary | ICD-10-CM | POA: Diagnosis not present

## 2020-10-13 MED ORDER — BUDESONIDE-FORMOTEROL FUMARATE 160-4.5 MCG/ACT IN AERO
INHALATION_SPRAY | RESPIRATORY_TRACT | 9 refills | Status: DC
  Filled 2020-10-13: qty 10.2, 30d supply, fill #0

## 2020-10-13 MED ORDER — FUROSEMIDE 40 MG PO TABS
40.0000 mg | ORAL_TABLET | Freq: Every day | ORAL | 3 refills | Status: DC
  Filled 2020-10-13: qty 90, 90d supply, fill #0

## 2020-10-13 MED ORDER — PANTOPRAZOLE SODIUM 40 MG PO TBEC
40.0000 mg | DELAYED_RELEASE_TABLET | Freq: Every day | ORAL | 3 refills | Status: DC
  Filled 2020-10-13: qty 90, 90d supply, fill #0

## 2020-10-13 MED ORDER — METOPROLOL SUCCINATE ER 25 MG PO TB24
25.0000 mg | ORAL_TABLET | Freq: Every day | ORAL | 5 refills | Status: DC
  Filled 2020-10-13: qty 30, 30d supply, fill #0

## 2020-10-13 MED ORDER — AMLODIPINE BESYLATE 10 MG PO TABS
10.0000 mg | ORAL_TABLET | Freq: Every day | ORAL | 5 refills | Status: DC
  Filled 2020-10-13 – 2020-10-28 (×2): qty 30, 30d supply, fill #0

## 2020-10-13 MED ORDER — LEVALBUTEROL HCL 1.25 MG/0.5ML IN NEBU
INHALATION_SOLUTION | RESPIRATORY_TRACT | 6 refills | Status: DC
  Filled 2020-10-13: qty 45, 30d supply, fill #0

## 2020-10-21 ENCOUNTER — Other Ambulatory Visit (HOSPITAL_COMMUNITY): Payer: Self-pay

## 2020-10-25 ENCOUNTER — Other Ambulatory Visit (HOSPITAL_COMMUNITY): Payer: Self-pay

## 2020-10-26 ENCOUNTER — Other Ambulatory Visit (HOSPITAL_COMMUNITY): Payer: Self-pay

## 2020-10-26 DIAGNOSIS — R7303 Prediabetes: Secondary | ICD-10-CM | POA: Diagnosis not present

## 2020-10-26 DIAGNOSIS — Z6841 Body Mass Index (BMI) 40.0 and over, adult: Secondary | ICD-10-CM | POA: Diagnosis not present

## 2020-10-26 DIAGNOSIS — J4540 Moderate persistent asthma, uncomplicated: Secondary | ICD-10-CM | POA: Diagnosis not present

## 2020-10-26 MED ORDER — OZEMPIC (0.25 OR 0.5 MG/DOSE) 2 MG/1.5ML ~~LOC~~ SOPN
PEN_INJECTOR | SUBCUTANEOUS | 1 refills | Status: DC
  Filled 2020-10-26: qty 1.5, 56d supply, fill #0
  Filled 2020-12-13: qty 1.5, 28d supply, fill #1

## 2020-10-27 ENCOUNTER — Other Ambulatory Visit (HOSPITAL_COMMUNITY): Payer: Self-pay

## 2020-10-28 ENCOUNTER — Other Ambulatory Visit (HOSPITAL_COMMUNITY): Payer: Self-pay

## 2020-10-31 ENCOUNTER — Other Ambulatory Visit (HOSPITAL_COMMUNITY): Payer: Self-pay

## 2020-11-01 ENCOUNTER — Other Ambulatory Visit (HOSPITAL_COMMUNITY): Payer: Self-pay

## 2020-11-02 ENCOUNTER — Other Ambulatory Visit (HOSPITAL_COMMUNITY): Payer: Self-pay

## 2020-11-07 ENCOUNTER — Other Ambulatory Visit (HOSPITAL_COMMUNITY): Payer: Self-pay

## 2020-11-07 MED ORDER — CYCLOBENZAPRINE HCL 5 MG PO TABS
ORAL_TABLET | ORAL | 1 refills | Status: DC
  Filled 2020-11-07: qty 90, 30d supply, fill #0
  Filled 2021-01-06: qty 90, 30d supply, fill #1

## 2020-11-07 NOTE — Progress Notes (Signed)
Subjective:    Patient ID: [name], female    DOB: [birth year], 55 y.o.   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 FVC 2.39/87%, FEV1 2.04/89%, FEV1/FVC 2.04, FEF 25-75 percent 2.59/87%. Unattended Home Sleep Test 07/15/16-AHI 13.9/hour, desaturation to 74%, body weight 240 pounds Resp Allergy Profile 06/29/16- Pos dog and timothy grass, IgE 41 HST 10/07/17-AHI 6.9/hour, desaturation to 81%, body weight 249 pounds -----------------------------------------------------------------------.   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 Had exacerbations in August and December. I raised possibility of exploring another Biologic. She had reacted to Xolair and wasn't interested at this time.  Still hasn't recovered sense of taste after Covid infection in 2020. Using neb 3-x/ day currently. Not using rescue hfa. Never really cleared after December flare. No fever, sinus trouble, etc, but continue to cough with green sputum. HRCT chest 10/29/19-  IMPRESSION: 1. Interval resolution of left upper lobe pulmonary nodules seen on prior examination, consistent with resolution of infection or inflammation. 2. Stable, definitively benign 3 mm fissural nodule of the right lower lobe. No further  routine CT follow-up is required. 3. No abnormality of the lungs to explain shortness of breath. No evidence of fibrotic interstitial lung disease.  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 In weight loss program. 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 He gave prednisone taper and Spiriva. Unclear benefit. She says she does better if she gets depo inj then prednisone- presumably issue is total steroid dose.  Asks GERD Rx change from Protonix- blamed for hair loss.   ROS-see HPI    + = positive Constitutional:     weight loss, night sweats, fevers, chills, +fatigue, lassitude. HEENT:   +headaches, difficulty swallowing, tooth/dental problems, sore throat,       No-  sneezing, itching, ear ache, nasal congestion, post nasal drip,  CV:    chest pain, orthopnea, PND, swelling in lower extremities, anasarca,  dizziness, +palpitations Resp: + shortness of breath with exertion or at rest.                 productive cough, non-productive cough,  No- coughing up of blood.                  change in color of mucus.  wheezing.   Skin: No-   rash or lesions. GI:  No-   heartburn, indigestion, abdominal pain, nausea, vomiting,  GU:  MS:  No-  joint pain or swelling.   Neuro-     nothing unusual Psych:  No- change in mood or affect. + depression or anxiety.  No memory loss.  OBJ- Physical Exam General- Alert, Oriented, Affect -appropriatel, Distress- none acute,  + obese Skin- rash-none, lesions- none, excoriation- none Lymphadenopathy- none Head-  atraumatic            Eyes- Gross vision intact, PERRLA, conjunctivae and secretions clear            Ears- Hearing, canals-normal            Nose- sniffing-none, no-Septal dev, mucus, polyps, erosion, perforation             Throat- Mallampati III , mucosa clear , drainage- none, tonsils- atrophic Neck- flexible , trachea midline, no stridor , thyroid nl, carotid no bruit Chest - symmetrical excursion , unlabored           Heart/CV- RRR , no murmur , no gallop  , no rub, nl s1 s2                           - JVD- none , edema- none, stasis changes- none, varices- none           Lung-   clear, wheeze -none, cough-none, dullness-none, rub- none           Chest wall-  Abd-  Br/ Gen/ Rectal- Not done, not indicated Extrem- cyanosis- none, clubbing, none, atrophy- none, strength- nl, cane Neuro- grossly intact to observation    Assessment & Plan:

## 2020-11-08 ENCOUNTER — Encounter: Payer: Self-pay | Admitting: Internal Medicine

## 2020-11-08 ENCOUNTER — Ambulatory Visit (INDEPENDENT_AMBULATORY_CARE_PROVIDER_SITE_OTHER): Payer: 59

## 2020-11-08 ENCOUNTER — Other Ambulatory Visit: Payer: Self-pay

## 2020-11-08 ENCOUNTER — Ambulatory Visit (INDEPENDENT_AMBULATORY_CARE_PROVIDER_SITE_OTHER): Payer: 59 | Admitting: Internal Medicine

## 2020-11-08 ENCOUNTER — Other Ambulatory Visit (HOSPITAL_COMMUNITY): Payer: Self-pay

## 2020-11-08 VITALS — BP 130/90 | HR 77 | Temp 98.3°F | Ht 63.0 in | Wt 272.0 lb

## 2020-11-08 DIAGNOSIS — K219 Gastro-esophageal reflux disease without esophagitis: Secondary | ICD-10-CM | POA: Diagnosis not present

## 2020-11-08 DIAGNOSIS — G4733 Obstructive sleep apnea (adult) (pediatric): Secondary | ICD-10-CM

## 2020-11-08 DIAGNOSIS — J4541 Moderate persistent asthma with (acute) exacerbation: Secondary | ICD-10-CM

## 2020-11-08 DIAGNOSIS — J4520 Mild intermittent asthma, uncomplicated: Secondary | ICD-10-CM | POA: Diagnosis not present

## 2020-11-08 DIAGNOSIS — J45909 Unspecified asthma, uncomplicated: Secondary | ICD-10-CM | POA: Diagnosis not present

## 2020-11-08 LAB — CBC WITH DIFFERENTIAL/PLATELET
Basophils Absolute: 0.1 10*3/uL (ref 0.0–0.1)
Basophils Relative: 0.8 % (ref 0.0–3.0)
Eosinophils Absolute: 0.4 10*3/uL (ref 0.0–0.7)
Eosinophils Relative: 5.8 % — ABNORMAL HIGH (ref 0.0–5.0)
HCT: 38 % (ref 36.0–46.0)
Hemoglobin: 12.3 g/dL (ref 12.0–15.0)
Lymphocytes Relative: 37.1 % (ref 12.0–46.0)
Lymphs Abs: 2.5 10*3/uL (ref 0.7–4.0)
MCHC: 32.5 g/dL (ref 30.0–36.0)
MCV: 80.7 fl (ref 78.0–100.0)
Monocytes Absolute: 0.7 10*3/uL (ref 0.1–1.0)
Monocytes Relative: 9.9 % (ref 3.0–12.0)
Neutro Abs: 3.1 10*3/uL (ref 1.4–7.7)
Neutrophils Relative %: 46.4 % (ref 43.0–77.0)
Platelets: 313 10*3/uL (ref 150.0–400.0)
RBC: 4.7 Mil/uL (ref 3.87–5.11)
RDW: 14.7 % (ref 11.5–15.5)
WBC: 6.7 10*3/uL (ref 4.0–10.5)

## 2020-11-08 MED ORDER — HYDROCOD POLST-CPM POLST ER 10-8 MG/5ML PO SUER
5.0000 mL | Freq: Two times a day (BID) | ORAL | 0 refills | Status: DC | PRN
  Filled 2020-11-08: qty 115, 12d supply, fill #0

## 2020-11-08 MED ORDER — BREZTRI AEROSPHERE 160-9-4.8 MCG/ACT IN AERO: 2.0000 | INHALATION_SPRAY | Freq: Two times a day (BID) | RESPIRATORY_TRACT | 0 refills | Status: DC

## 2020-11-08 MED ORDER — ESOMEPRAZOLE MAGNESIUM 20 MG PO CPDR
DELAYED_RELEASE_CAPSULE | ORAL | 5 refills | Status: DC
  Filled 2020-11-08: qty 30, 30d supply, fill #0
  Filled 2021-01-06: qty 30, 30d supply, fill #1
  Filled 2021-04-06: qty 30, 30d supply, fill #2

## 2020-11-08 NOTE — Assessment & Plan Note (Signed)
Encourage weight loss, sleep off back

## 2020-11-08 NOTE — Assessment & Plan Note (Signed)
May eventually need consideration of Dupixent trial  Currently clear and uncomplicated. She asks about a steroid inj, but without indication. Discussed avoidance of systemic steroids when possible. Plan- try samples Breztri instead of Symbicort (expensive) with Spiriva. Lab for CBC w diff, IgE, Allergy profile.

## 2020-11-08 NOTE — Patient Instructions (Addendum)
Sample x 2 Breztri inhaler     inhale 2 puffs then rinse mouth, twice daily.  Try this instead of Symbicort/ Spiriva. If it works well, check with your pharmacy about what it would cost and let us know if you would like to change to [place].  Order- CXR   dx Asthma moderate persistent  Order lab- CBC w diff, IgE, Respiratory Allergy panel      dx asthma moderate persistent  Script sent changing Protonix to Nexium   Script sent for Tussionex refill

## 2020-11-08 NOTE — Assessment & Plan Note (Signed)
At her request, changing from Protonix to Nexium. She thinks Protonix caused hair loss.

## 2020-11-08 NOTE — Progress Notes (Signed)
Patient seen in the office today and instructed on use of Breztri.  Patient expressed understanding and demonstrated technique.  

## 2020-11-10 ENCOUNTER — Encounter: Payer: Self-pay | Admitting: *Deleted

## 2020-11-11 LAB — ALLERGEN PANEL (27) + IGE
Alternaria Alternata IgE: <0.1 kU/L
Aspergillus Fumigatus IgE: <0.1 kU/L
Bahia Grass IgE: 0.16 kU/L — AB
Bermuda Grass IgE: <0.1 kU/L
Cat Dander IgE: <0.1 kU/L
Cedar, Mountain IgE: <0.1 kU/L
Cladosporium Herbarum IgE: <0.1 kU/L
Cocklebur IgE: <0.1 kU/L
Cockroach, American IgE: <0.1 kU/L
Common Silver Birch IgE: <0.1 kU/L
D Farinae IgE: <0.1 kU/L
D Pteronyssinus IgE: 0.1 kU/L — AB
Dog Dander IgE: 0.49 kU/L — AB
Elm, American IgE: <0.1 kU/L
Hickory, White IgE: 0.11 kU/L — AB
IgE (Immunoglobulin E), Serum: 69 IU/mL (ref 6–495)
Johnson Grass IgE: 0.1 kU/L — AB
Kentucky Bluegrass IgE: 0.43 kU/L — AB
Maple/Box Elder IgE: <0.1 kU/L
Mucor Racemosus IgE: <0.1 kU/L
Oak, White IgE: <0.1 kU/L
Penicillium Chrysogen IgE: <0.1 kU/L
Pigweed, Rough IgE: <0.1 kU/L
Plantain, English IgE: <0.1 kU/L
Ragweed, Short IgE: <0.1 kU/L
Setomelanomma Rostrat: <0.1 kU/L
Timothy Grass IgE: 0.37 kU/L — AB
White Mulberry IgE: <0.1 kU/L

## 2020-11-11 NOTE — Progress Notes (Signed)
Called and spoke with patient, provided results/recommendations per Dr. Annamaria Boots.  Patient verbalized understanding.  She stated she is willing to try anything, but she is scared since she developed angioedema the last time while she was sitting in the waiting room.  Advised she can further discuss with Dr. Annamaria Boots in September at her f/u appointment.  Nothing further needed.

## 2020-11-14 ENCOUNTER — Other Ambulatory Visit (HOSPITAL_COMMUNITY): Payer: Self-pay

## 2020-12-13 ENCOUNTER — Other Ambulatory Visit (HOSPITAL_COMMUNITY): Payer: Self-pay

## 2020-12-13 DIAGNOSIS — R7303 Prediabetes: Secondary | ICD-10-CM | POA: Diagnosis not present

## 2020-12-13 DIAGNOSIS — Z713 Dietary counseling and surveillance: Secondary | ICD-10-CM | POA: Diagnosis not present

## 2020-12-20 ENCOUNTER — Other Ambulatory Visit (HOSPITAL_COMMUNITY): Payer: Self-pay

## 2020-12-20 DIAGNOSIS — R7303 Prediabetes: Secondary | ICD-10-CM | POA: Diagnosis not present

## 2020-12-20 DIAGNOSIS — Z6841 Body Mass Index (BMI) 40.0 and over, adult: Secondary | ICD-10-CM | POA: Diagnosis not present

## 2020-12-20 MED ORDER — OZEMPIC (0.25 OR 0.5 MG/DOSE) 2 MG/1.5ML ~~LOC~~ SOPN
PEN_INJECTOR | SUBCUTANEOUS | 2 refills | Status: DC
  Filled 2020-12-20: qty 1.5, 28d supply, fill #0
  Filled 2021-01-16: qty 4.5, 84d supply, fill #0

## 2020-12-28 NOTE — Progress Notes (Signed)
Subjective:    Patient ID: [name], female    DOB: [birth year], 55 y.o.   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 FVC 2.39/87%, FEV1 2.04/89%, FEV1/FVC 2.04, FEF 25-75 percent 2.59/87%. Unattended Home Sleep Test 07/15/16-AHI 13.9/hour, desaturation to 74%, body weight 240 pounds Resp Allergy Profile 06/29/16- Pos dog and timothy grass, IgE 41 HST 10/07/17-AHI 6.9/hour, desaturation to 81%, body weight 249 pounds -----------------------------------------------------------------------.   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 In weight loss program. 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 He gave prednisone taper and Spiriva. Unclear benefit. She says she does better if she gets depo inj then prednisone- presumably issue is total steroid dose.  Asks GERD Rx change from Protonix- blamed for hair loss.   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 Breathing feels normal for her. Averaging 3 steroid dependent exacerbations/ year. We discussed Trying again with a Biologic- Dupixent. Really want to avoid systemic steroids as she works with a trainer to get her weight down. Still no sense of smell or taste after Covid infection.  CXR 11/08/20-  IMPRESSION: No active cardiopulmonary disease.   ROS-see HPI    + = positive Constitutional:     weight loss, night sweats, fevers, chills, +fatigue, lassitude. HEENT:   +headaches, difficulty swallowing, tooth/dental problems, sore throat,       No-  sneezing, itching, ear ache, nasal congestion, post nasal drip,  CV:    chest pain, orthopnea, PND, swelling in lower extremities, anasarca,  dizziness, +palpitations Resp: + shortness of breath with exertion or at rest.                 productive cough, non-productive cough,  No- coughing up of blood.                  change in color of mucus.  wheezing.   Skin: No-   rash or lesions. GI:  No-   heartburn, indigestion, abdominal pain, nausea, vomiting,  GU:  MS:  No-   joint pain or swelling.   Neuro-     nothing unusual Psych:  No- change in mood or affect. + depression or anxiety.  No memory loss.  OBJ- Physical Exam General- Alert, Oriented, Affect -appropriatel, Distress- none acute,  + morbidly obese Skin- rash-none, lesions- none, excoriation- none Lymphadenopathy- none Head- atraumatic            Eyes- Gross vision intact, PERRLA, conjunctivae and secretions clear  Ears- Hearing, canals-normal            Nose- sniffing-none, no-Septal dev, mucus, polyps, erosion, perforation             Throat- Mallampati III , mucosa clear , drainage- none, tonsils- atrophic Neck- flexible , trachea midline, no stridor , thyroid nl, carotid no bruit Chest - symmetrical excursion , unlabored           Heart/CV- RRR , no murmur , no gallop  , no rub, nl s1 s2                           - JVD-  none , edema- none, stasis changes- none, varices- none           Lung-   clear, wheeze -none, cough-none, dullness-none, rub- none           Chest wall-  Abd-  Br/ Gen/ Rectal- Not done, not indicated Extrem- cyanosis- none, clubbing, none, atrophy- none, strength- nl, cane Neuro- grossly intact to observation    Assessment & Plan:

## 2020-12-29 ENCOUNTER — Encounter: Payer: Self-pay | Admitting: Internal Medicine

## 2020-12-29 ENCOUNTER — Ambulatory Visit (INDEPENDENT_AMBULATORY_CARE_PROVIDER_SITE_OTHER): Payer: 59 | Admitting: Internal Medicine

## 2020-12-29 ENCOUNTER — Other Ambulatory Visit (HOSPITAL_COMMUNITY): Payer: Self-pay

## 2020-12-29 ENCOUNTER — Other Ambulatory Visit: Payer: Self-pay

## 2020-12-29 DIAGNOSIS — R439 Unspecified disturbances of smell and taste: Secondary | ICD-10-CM | POA: Diagnosis not present

## 2020-12-29 DIAGNOSIS — R438 Other disturbances of smell and taste: Secondary | ICD-10-CM

## 2020-12-29 DIAGNOSIS — G4733 Obstructive sleep apnea (adult) (pediatric): Secondary | ICD-10-CM | POA: Diagnosis not present

## 2020-12-29 DIAGNOSIS — J4550 Severe persistent asthma, uncomplicated: Secondary | ICD-10-CM | POA: Diagnosis not present

## 2020-12-29 DIAGNOSIS — U099 Post covid-19 condition, unspecified: Secondary | ICD-10-CM | POA: Diagnosis not present

## 2020-12-29 MED ORDER — HYDROCOD POLST-CPM POLST ER 10-8 MG/5ML PO SUER
5.0000 mL | Freq: Two times a day (BID) | ORAL | 0 refills | Status: DC | PRN
  Filled 2020-12-29 – 2021-02-20 (×2): qty 115, 12d supply, fill #0

## 2020-12-29 MED ORDER — LEVOCETIRIZINE DIHYDROCHLORIDE 5 MG PO TABS
5.0000 mg | ORAL_TABLET | Freq: Every evening | ORAL | 5 refills | Status: DC
  Filled 2020-12-29 – 2021-01-06 (×2): qty 30, 30d supply, fill #0

## 2020-12-29 MED ORDER — MONTELUKAST SODIUM 10 MG PO TABS
10.0000 mg | ORAL_TABLET | Freq: Every day | ORAL | 10 refills | Status: DC
  Filled 2020-12-29 – 2021-01-06 (×2): qty 30, 30d supply, fill #0

## 2020-12-29 MED ORDER — OLOPATADINE HCL 0.1 % OP SOLN
OPHTHALMIC | 5 refills | Status: DC
  Filled 2020-12-29: qty 5, 20d supply, fill #0

## 2020-12-29 MED ORDER — BENZONATATE 200 MG PO CAPS
200.0000 mg | ORAL_CAPSULE | Freq: Three times a day (TID) | ORAL | 1 refills | Status: DC | PRN
  Filled 2020-12-29 – 2021-01-06 (×2): qty 30, 10d supply, fill #0

## 2020-12-29 MED ORDER — ALBUTEROL SULFATE (2.5 MG/3ML) 0.083% IN NEBU
INHALATION_SOLUTION | RESPIRATORY_TRACT | 12 refills | Status: DC
  Filled 2020-12-29 – 2021-01-06 (×2): qty 90, 7d supply, fill #0

## 2020-12-29 MED ORDER — AZELASTINE-FLUTICASONE 137-50 MCG/ACT NA SUSP
NASAL | 5 refills | Status: DC
  Filled 2020-12-29 – 2021-01-06 (×2): qty 23, 30d supply, fill #0

## 2020-12-29 MED ORDER — BREZTRI AEROSPHERE 160-9-4.8 MCG/ACT IN AERO: 2.0000 | INHALATION_SPRAY | Freq: Two times a day (BID) | RESPIRATORY_TRACT | 0 refills | Status: DC

## 2020-12-29 MED ORDER — ALBUTEROL SULFATE HFA 108 (90 BASE) MCG/ACT IN AERS
INHALATION_SPRAY | RESPIRATORY_TRACT | 12 refills | Status: DC
  Filled 2020-12-29 – 2021-01-06 (×2): qty 18, 25d supply, fill #0

## 2020-12-29 NOTE — Assessment & Plan Note (Signed)
Doing well since last flare in July, but still on extensive list of meds, needing refills today. Obesity is a substantial conceern and we discussed need to avoid systemic steroids when possible. Plan- Coin

## 2020-12-29 NOTE — Patient Instructions (Signed)
Order- Infusion Clinic- start [place] for severe persistent asthma  Routine meds have been refilled at [facility]  Please call if we can help

## 2020-12-29 NOTE — Assessment & Plan Note (Signed)
Discussed re-evaluation in the future. She was not able to stay compliant with CPAP

## 2020-12-29 NOTE — Assessment & Plan Note (Signed)
She reports working now with a Clinical research associate. Needs all the help she can get.

## 2020-12-29 NOTE — Assessment & Plan Note (Signed)
Has not regained smell or taste and questions if some residual dyspnea is related.

## 2020-12-30 ENCOUNTER — Other Ambulatory Visit (HOSPITAL_COMMUNITY): Payer: Self-pay

## 2021-01-02 ENCOUNTER — Other Ambulatory Visit (HOSPITAL_COMMUNITY): Payer: Self-pay

## 2021-01-06 ENCOUNTER — Telehealth: Payer: Self-pay

## 2021-01-06 ENCOUNTER — Other Ambulatory Visit (HOSPITAL_COMMUNITY): Payer: Self-pay

## 2021-01-06 ENCOUNTER — Other Ambulatory Visit: Payer: Self-pay | Admitting: Internal Medicine

## 2021-01-06 DIAGNOSIS — J4550 Severe persistent asthma, uncomplicated: Secondary | ICD-10-CM

## 2021-01-06 MED ORDER — CARESTART COVID-19 HOME TEST VI KIT
PACK | 0 refills | Status: DC
  Filled 2021-01-06: qty 4, 4d supply, fill #0

## 2021-01-06 NOTE — Telephone Encounter (Signed)
Received new start paperwork for [name]. Will begin BIV.  Submitted a Prior Authorization request to [facility] for DUPIXENT via CoverMyMeds. Will update once we receive a response.   Key: [id number]

## 2021-01-07 ENCOUNTER — Other Ambulatory Visit (HOSPITAL_COMMUNITY): Payer: Self-pay

## 2021-01-07 MED ORDER — IBUPROFEN 800 MG PO TABS
800.0000 mg | ORAL_TABLET | Freq: Three times a day (TID) | ORAL | 1 refills | Status: DC | PRN
  Filled 2021-01-07: qty 90, 30d supply, fill #0
  Filled 2021-03-30: qty 90, 30d supply, fill #1

## 2021-01-09 ENCOUNTER — Other Ambulatory Visit: Payer: Self-pay

## 2021-01-09 ENCOUNTER — Other Ambulatory Visit (HOSPITAL_COMMUNITY): Payer: Self-pay

## 2021-01-09 ENCOUNTER — Other Ambulatory Visit: Payer: Self-pay | Admitting: Internal Medicine

## 2021-01-10 ENCOUNTER — Other Ambulatory Visit (HOSPITAL_COMMUNITY): Payer: Self-pay

## 2021-01-10 MED FILL — Lorazepam Tab 1 MG: ORAL | 10 days supply | Qty: 30 | Fill #0 | Status: AC

## 2021-01-10 NOTE — Telephone Encounter (Signed)
Dr. Annamaria Boots, please advise if you are okay refilling med.  Allergies  Allergen Reactions   Influenza A (H1n1) [place] Vac     Reaction: systemic reaction   Omalizumab     Arvid Right* REACTION: angioedema-looses airway   Promethazine Hcl     REACTION: hallucinations with too high of dose   Topiramate     Current Outpatient Medications:    albuterol (PROVENTIL) (2.5 MG/3ML) 0.083% nebulizer solution, USE 1 VIAL VIA NEBULIZER EVERY 6 HOURS AS NEEDED FOR WHEEZING OR SHORTNESS OF BREATH, Disp: 90 mL, Rfl: 12   amLODipine (NORVASC) 10 MG tablet, TAKE 1 TABLET BY MOUTH ONCE A DAY, Disp: 30 tablet, Rfl: 5   Ascorbic Acid (VITAMIN C) 500 MG CAPS, Take by mouth daily., Disp: , Rfl:    Azelastine-Fluticasone (DYMISTA) 137-50 MCG/ACT SUSP, Use 1 spray in each nostril twice daily as directed, Disp: 23 g, Rfl: 5   benzonatate (TESSALON) 200 MG capsule, Take 1 capsule (200 mg total) by mouth 3 (three) times daily as needed for cough., Disp: 30 capsule, Rfl: 1   Budeson-Glycopyrrol-Formoterol (BREZTRI AEROSPHERE) 160-9-4.8 MCG/ACT AERO, Inhale 2 puffs into the lungs 2 (two) times daily., Disp: 10.7 g, Rfl: 0   chlorpheniramine-HYDROcodone (TUSSIONEX PENNKINETIC ER) 10-8 MG/5ML SUER, Take 5 mLs by mouth every 12 (twelve) hours as needed for cough., Disp: 115 mL, Rfl: 0   Cholecalciferol (VITAMIN D-3) 125 MCG (5000 UT) TABS, Take by mouth daily., Disp: , Rfl:    COVID-19 At Home Antigen Test [facility] COVID-19 HOME TEST) KIT, Use as directed, Disp: 4 each, Rfl: 0   cyclobenzaprine (FLEXERIL) 5 MG tablet, Take 1 tablet by mouth three times daily if needed for spasms., Disp: 90 tablet, Rfl: 1   dextromethorphan-guaiFENesin (MUCINEX DM) 30-600 MG 12hr tablet, Take 1 tablet by mouth 2 (two) times daily. (Patient taking differently: Take 1 tablet by mouth as needed.), Disp: 10 tablet, Rfl: 0   econazole nitrate 1 % cream, Apply 1 application topically daily as needed. , Disp: , Rfl: 2   EPINEPHRINE 0.3 mg/0.3 mL IJ  SOAJ injection, INJECT INTO THIGH FOR SEVERE ASTHMA/ ALLERGIC REACTION, Disp: 2 each, Rfl: 12   esomeprazole (NEXIUM) 20 MG capsule, Take 1 capsule by mouth daily before a meal, Disp: 31 capsule, Rfl: 5   furosemide (LASIX) 40 MG tablet, TAKE 1 TABLET BY MOUTH ONCE DAILY AS NEEDED (Patient taking differently: Take 40 mg by mouth daily as needed for fluid.), Disp: 30 tablet, Rfl: 5   ibuprofen (ADVIL) 800 MG tablet, TAKE 1 TABLET BY MOUTH 3 TIMES DAILY AS NEEDED, Disp: 90 tablet, Rfl: 1   levocetirizine (XYZAL) 5 MG tablet, Take 1 tablet (5 mg total) by mouth every evening., Disp: 30 tablet, Rfl: 5   metoprolol succinate (TOPROL-XL) 25 MG 24 hr tablet, Take 1 tablet (25 mg total) by mouth daily., Disp: 30 tablet, Rfl: 5   montelukast (SINGULAIR) 10 MG tablet, Take 1 tablet (10 mg total) by mouth at bedtime., Disp: 30 tablet, Rfl: 10   olopatadine (PATANOL) 0.1 % ophthalmic solution, INSTILL 1 DROP INTO BOTH EYES TWO TIMES DAILY AS NEEDED FOR ALLERGIES, Disp: 5 mL, Rfl: 5   Semaglutide,0.25 or 0.5MG/DOS, (OZEMPIC, 0.25 OR 0.5 MG/DOSE,) 2 MG/1.5ML SOPN, Inject 0.5 mg subcutaneously once weekly, Disp: 1.5 mL, Rfl: 2   Spacer/Aero-Holding Chambers (AEROCHAMBER MV) inhaler, Use as instructed, Disp: 1 each, Rfl: 0   traMADol (ULTRAM) 50 MG tablet, TAKE 1 TABLET BY MOUTH EVERY 4 HOURS AS NEEDED FOR PAIN, Disp:  30 tablet, Rfl: 5   TRANSDERM-SCOP, 1.5 MG, 1 MG/3DAYS, APPLY 1 PATCH ONTO THE SKIN EVERY 3 DAYS., Disp: 10 patch, Rfl: 12   urea (CARMOL) 40 % CREA, Apply 1 application topically daily as needed. , Disp: , Rfl: 2   albuterol (VENTOLIN HFA) 108 (90 Base) MCG/ACT inhaler, Inhale 2 puffs into the lungs every 6 hours as needed for wheezing, Disp: 18 g, Rfl: 12   vitamin E 1000 UNIT capsule, Take 1,000 Units by mouth daily., Disp: , Rfl:    zinc gluconate 50 MG tablet, Take 50 mg by mouth at bedtime., Disp: , Rfl:

## 2021-01-10 NOTE — Telephone Encounter (Signed)
Lorazepam refilled

## 2021-01-11 ENCOUNTER — Other Ambulatory Visit (HOSPITAL_COMMUNITY): Payer: Self-pay

## 2021-01-11 NOTE — Telephone Encounter (Signed)
Received notification from [facility] regarding a prior authorization for [place]. Authorization has been APPROVED and is split into 2 parts: Auth# [id number] is for the loading dose and allows ONE fill of 50mL as a 28 days supply between 01/10/2021 and 02/08/2021.  Auth# [id number] is the maintenance allowing 58mL every 28 days from 02/15/2021 until 05/17/2021.  Per test claim, copay for loading dose is $1,000.  Patient must fill through [place]: [phone]   Authorization # [id number], [id number]   Pt signed up for copay card and info has been entered into [facility]. RxBIN: [id number] RxPCN: [id number] RxGRP: [id number] ID: [id number]

## 2021-01-12 ENCOUNTER — Other Ambulatory Visit (HOSPITAL_COMMUNITY): Payer: Self-pay

## 2021-01-12 NOTE — Telephone Encounter (Signed)
ATC patient to schedule Dupixent new start. Unable to reach and VM box is full. Will send MyChart message to request call  Knox Saliva, PharmD, MPH, BCPS Clinical Pharmacist (Rheumatology and Pulmonology)

## 2021-01-16 ENCOUNTER — Other Ambulatory Visit (HOSPITAL_COMMUNITY): Payer: Self-pay

## 2021-01-18 NOTE — Telephone Encounter (Signed)
ATC patient to schedule Dupixent new start. Unable to reach. VM box is full and cannot accept new VM. Will try again on Friday, 01/20/21

## 2021-01-20 NOTE — Telephone Encounter (Signed)
ATC #3 patient to schedule Dupixent new start. Unable to reach patient. VM box is still full and cannot accept new VM. Will make one final attempt upcoming week. If no response, will notify Dr. Annamaria Boots. She has f/u with Dr. Annamaria Boots on 06/28/21  Knox Saliva, PharmD, MPH, BCPS Clinical Pharmacist (Rheumatology and Pulmonology)

## 2021-01-24 ENCOUNTER — Other Ambulatory Visit: Payer: Self-pay | Admitting: Internal Medicine

## 2021-01-24 ENCOUNTER — Other Ambulatory Visit (HOSPITAL_COMMUNITY): Payer: Self-pay

## 2021-01-25 NOTE — Telephone Encounter (Signed)
Spoke with patient about Dupixent approval. She states that she is nervous to start [place] because of her previous angioedema reaction to Xolair at first dose. She states that it occurred right at the 2 hour mark. We reviewed that the risk of anaphylaxis with Dupixent is rare, whereas Xolair has BBW associated with anaphylactic reaction. Reviewed that we will still monitor her for reaction for 2 hours if she'd prefer though we do not expect a similar reaction to Dupixent as she had with Xolair.  We reviewed the goals of therapy: reduce oral corticosteroid use, frequency of exacerbations, and reduce frequency rescue inhaler use.   We reviewed every other week dosing. We reviewed that the first dose in clinic is loading dose that requires two injections  She states she is on board with starting but has initial nervousness that she has to overcome.  MyChart message to patient with direct office number to schedule new start visit since patient was driving and didn't have her schedule on hand  Knox Saliva, PharmD, MPH, BCPS Clinical Pharmacist (Rheumatology and Pulmonology)

## 2021-01-30 NOTE — Telephone Encounter (Signed)
Submitted a Prior Authorization request to [facility] for DUPIXENT via CoverMyMeds. Will update once we receive a response.  Advised plan, patient will need loading doses re-approved.   (Key: [id number]) - [id number]

## 2021-01-30 NOTE — Telephone Encounter (Signed)
ATC patient to schedule Dupixent new start. Unable to reach and VM box is full  Knox Saliva, PharmD, MPH, BCPS Clinical Pharmacist (Rheumatology and Pulmonology)

## 2021-02-01 ENCOUNTER — Other Ambulatory Visit (HOSPITAL_COMMUNITY): Payer: Self-pay

## 2021-02-01 MED ORDER — DUPIXENT 300 MG/2ML ~~LOC~~ SOAJ
SUBCUTANEOUS | 0 refills | Status: DC
  Filled 2021-02-01: qty 8, fill #0
  Filled 2021-02-02 (×2): qty 8, 28d supply, fill #0

## 2021-02-01 NOTE — Telephone Encounter (Signed)
Plan marked PA extension for loading doses as duplicate- can we go ahead and send her rx to [facility] to fill loading doses?- PA expires 02/08/21.

## 2021-02-01 NOTE — Telephone Encounter (Signed)
Rx for Dupixent sent to [facility] for 25mL.  Directions: 600mg  at Week 0, then 300mg  every 14 days thereafter  Routing to [place] to coordinate shipment  Knox Saliva, PharmD, MPH, BCPS Clinical Pharmacist (Rheumatology and Pulmonology)

## 2021-02-02 ENCOUNTER — Other Ambulatory Visit (HOSPITAL_COMMUNITY): Payer: Self-pay

## 2021-02-02 ENCOUNTER — Telehealth: Payer: Self-pay | Admitting: Pharmacist

## 2021-02-02 NOTE — Telephone Encounter (Signed)
Called patient to schedule an appointment for the [employer] [facility]. I was unable to reach the patient so I left a HIPAA-compliant message requesting that the patient return my call.   Luke Van Ausdall, PharmD, BCACP, CPP Clinical Pharmacist [facility] [phone]  

## 2021-02-02 NOTE — Telephone Encounter (Signed)
ATC patient to schedule Dupixent new start. Unable to reach and VM box is full  Knox Saliva, PharmD, MPH, BCPS Clinical Pharmacist (Rheumatology and Pulmonology)

## 2021-02-02 NOTE — Telephone Encounter (Signed)
Zero copay with copay card. Shipment will courier to clinic by 10/17

## 2021-02-03 ENCOUNTER — Other Ambulatory Visit (HOSPITAL_COMMUNITY): Payer: Self-pay

## 2021-02-03 MED ORDER — METHYLPREDNISOLONE 4 MG PO TBPK
ORAL_TABLET | ORAL | 0 refills | Status: DC
  Filled 2021-02-03: qty 21, 6d supply, fill #0

## 2021-02-03 MED ORDER — NEOMYCIN-POLYMYXIN-DEXAMETH 3.5-10000-0.1 OP OINT
TOPICAL_OINTMENT | OPHTHALMIC | 0 refills | Status: DC
  Filled 2021-02-03: qty 3.5, 5d supply, fill #0

## 2021-02-03 MED ORDER — CEPHALEXIN 500 MG PO CAPS
ORAL_CAPSULE | ORAL | 0 refills | Status: DC
  Filled 2021-02-03: qty 20, 10d supply, fill #0

## 2021-02-03 MED ORDER — PREDNISOLONE ACETATE 1 % OP SUSP
OPHTHALMIC | 0 refills | Status: DC
  Filled 2021-02-03: qty 10, 21d supply, fill #0

## 2021-02-03 MED ORDER — FLUCONAZOLE 150 MG PO TABS
ORAL_TABLET | ORAL | 0 refills | Status: DC
  Filled 2021-02-03: qty 1, 1d supply, fill #0

## 2021-02-13 ENCOUNTER — Emergency Department (HOSPITAL_BASED_OUTPATIENT_CLINIC_OR_DEPARTMENT_OTHER)
Admission: EM | Admit: 2021-02-13 | Discharge: 2021-02-13 | Disposition: A | Discharge status: Home / Self Care | Payer: 59 | Attending: Emergency Medicine | Admitting: Emergency Medicine

## 2021-02-13 ENCOUNTER — Emergency Department (HOSPITAL_BASED_OUTPATIENT_CLINIC_OR_DEPARTMENT_OTHER): Payer: 59

## 2021-02-13 ENCOUNTER — Encounter (HOSPITAL_BASED_OUTPATIENT_CLINIC_OR_DEPARTMENT_OTHER): Payer: Self-pay

## 2021-02-13 ENCOUNTER — Other Ambulatory Visit: Payer: Self-pay

## 2021-02-13 ENCOUNTER — Emergency Department (HOSPITAL_BASED_OUTPATIENT_CLINIC_OR_DEPARTMENT_OTHER)
Admission: EM | Admit: 2021-02-13 | Discharge: 2021-02-13 | Disposition: A | Discharge status: Home / Self Care | Payer: 59 | Source: Home / Self Care | Attending: Emergency Medicine | Admitting: Emergency Medicine

## 2021-02-13 ENCOUNTER — Encounter (HOSPITAL_BASED_OUTPATIENT_CLINIC_OR_DEPARTMENT_OTHER): Payer: Self-pay | Admitting: Emergency Medicine

## 2021-02-13 DIAGNOSIS — Z79899 Other long term (current) drug therapy: Secondary | ICD-10-CM | POA: Insufficient documentation

## 2021-02-13 DIAGNOSIS — J4551 Severe persistent asthma with (acute) exacerbation: Secondary | ICD-10-CM | POA: Insufficient documentation

## 2021-02-13 DIAGNOSIS — R2241 Localized swelling, mass and lump, right lower limb: Secondary | ICD-10-CM | POA: Insufficient documentation

## 2021-02-13 DIAGNOSIS — R000 Tachycardia, unspecified: Secondary | ICD-10-CM | POA: Insufficient documentation

## 2021-02-13 DIAGNOSIS — J181 Lobar pneumonia, unspecified organism: Secondary | ICD-10-CM | POA: Insufficient documentation

## 2021-02-13 DIAGNOSIS — R1110 Vomiting, unspecified: Secondary | ICD-10-CM | POA: Insufficient documentation

## 2021-02-13 DIAGNOSIS — M7918 Myalgia, other site: Secondary | ICD-10-CM | POA: Insufficient documentation

## 2021-02-13 DIAGNOSIS — R509 Fever, unspecified: Secondary | ICD-10-CM | POA: Insufficient documentation

## 2021-02-13 DIAGNOSIS — Z8615 Personal history of latent tuberculosis infection: Secondary | ICD-10-CM | POA: Diagnosis not present

## 2021-02-13 DIAGNOSIS — Z5321 Procedure and treatment not carried out due to patient leaving prior to being seen by health care provider: Secondary | ICD-10-CM | POA: Insufficient documentation

## 2021-02-13 DIAGNOSIS — J189 Pneumonia, unspecified organism: Secondary | ICD-10-CM | POA: Diagnosis not present

## 2021-02-13 DIAGNOSIS — R0602 Shortness of breath: Secondary | ICD-10-CM | POA: Diagnosis not present

## 2021-02-13 DIAGNOSIS — Z7951 Long term (current) use of inhaled steroids: Secondary | ICD-10-CM | POA: Insufficient documentation

## 2021-02-13 DIAGNOSIS — Z20822 Contact with and (suspected) exposure to covid-19: Secondary | ICD-10-CM | POA: Insufficient documentation

## 2021-02-13 DIAGNOSIS — M7989 Other specified soft tissue disorders: Secondary | ICD-10-CM | POA: Diagnosis not present

## 2021-02-13 DIAGNOSIS — R112 Nausea with vomiting, unspecified: Secondary | ICD-10-CM | POA: Insufficient documentation

## 2021-02-13 DIAGNOSIS — I10 Essential (primary) hypertension: Secondary | ICD-10-CM | POA: Insufficient documentation

## 2021-02-13 DIAGNOSIS — Z8616 Personal history of COVID-19: Secondary | ICD-10-CM | POA: Insufficient documentation

## 2021-02-13 LAB — CBC WITH DIFFERENTIAL/PLATELET
Abs Immature Granulocytes: 0.16 10*3/uL — ABNORMAL HIGH (ref 0.00–0.07)
Basophils Absolute: 0.1 10*3/uL (ref 0.0–0.1)
Basophils Relative: 0 %
Eosinophils Absolute: 0.1 10*3/uL (ref 0.0–0.5)
Eosinophils Relative: 0 %
HCT: 39.3 % (ref 36.0–46.0)
Hemoglobin: 12.5 g/dL (ref 12.0–15.0)
Immature Granulocytes: 1 %
Lymphocytes Relative: 9 %
Lymphs Abs: 2 10*3/uL (ref 0.7–4.0)
MCH: 26 pg (ref 26.0–34.0)
MCHC: 31.8 g/dL (ref 30.0–36.0)
MCV: 81.7 fL (ref 80.0–100.0)
Monocytes Absolute: 1 10*3/uL (ref 0.1–1.0)
Monocytes Relative: 5 %
Neutro Abs: 18.7 10*3/uL — ABNORMAL HIGH (ref 1.7–7.7)
Neutrophils Relative %: 85 %
Platelets: 338 10*3/uL (ref 150–400)
RBC: 4.81 MIL/uL (ref 3.87–5.11)
RDW: 14.5 % (ref 11.5–15.5)
WBC: 21.9 10*3/uL — ABNORMAL HIGH (ref 4.0–10.5)
nRBC: 0 % (ref 0.0–0.2)

## 2021-02-13 LAB — RESP PANEL BY RT-PCR (FLU A&B, COVID) ARPGX2
Influenza A by PCR: NEGATIVE
Influenza B by PCR: NEGATIVE
SARS Coronavirus 2 by RT PCR: NEGATIVE

## 2021-02-13 LAB — BASIC METABOLIC PANEL
Anion gap: 10 (ref 5–15)
BUN: 18 mg/dL (ref 6–20)
CO2: 27 mmol/L (ref 22–32)
Calcium: 9.4 mg/dL (ref 8.9–10.3)
Chloride: 101 mmol/L (ref 98–111)
Creatinine, Ser: 1.31 mg/dL — ABNORMAL HIGH (ref 0.44–1.00)
GFR, Estimated: 49 mL/min — ABNORMAL LOW (ref >60)
Glucose, Bld: 117 mg/dL — ABNORMAL HIGH (ref 70–99)
Potassium: 3.8 mmol/L (ref 3.5–5.1)
Sodium: 138 mmol/L (ref 135–145)

## 2021-02-13 MED ORDER — AZITHROMYCIN 250 MG PO TABS
250.0000 mg | ORAL_TABLET | Freq: Once | ORAL | Status: DC
  Filled 2021-02-13: qty 1

## 2021-02-13 MED ORDER — ACETAMINOPHEN 325 MG PO TABS
650.0000 mg | ORAL_TABLET | Freq: Once | ORAL | Status: AC
  Administered 2021-02-13: 650 mg via ORAL
  Filled 2021-02-13: qty 2

## 2021-02-13 MED ORDER — SODIUM CHLORIDE 0.9 % IV SOLN
1.0000 g | Freq: Once | INTRAVENOUS | Status: AC
  Administered 2021-02-13: 1 g via INTRAVENOUS
  Filled 2021-02-13: qty 10

## 2021-02-13 MED ORDER — DEXTROSE 5 % IV SOLN: 250.0000 mg | Freq: Once | INTRAVENOUS | Status: DC

## 2021-02-13 MED ORDER — AZITHROMYCIN 250 MG PO TABS
250.0000 mg | ORAL_TABLET | Freq: Once | ORAL | Status: AC
  Administered 2021-02-13: 250 mg via ORAL

## 2021-02-13 MED ORDER — ONDANSETRON 4 MG PO TBDP
4.0000 mg | ORAL_TABLET | Freq: Once | ORAL | Status: AC
  Administered 2021-02-13: 4 mg via ORAL
  Filled 2021-02-13: qty 1

## 2021-02-13 MED ORDER — AMOXICILLIN-POT CLAVULANATE 875-125 MG PO TABS
1.0000 | ORAL_TABLET | Freq: Two times a day (BID) | ORAL | 0 refills | Status: DC
  Filled 2021-02-13: qty 14, 7d supply, fill #0

## 2021-02-13 NOTE — Discharge Instructions (Addendum)
Please take your entire course of antibiotics, you can take ibuprofen and Tylenol as needed for fever, body aches. I encourage rest, plenty of fluids. Please return if you begin to experience worsening shortness of breath, chest pain.

## 2021-02-13 NOTE — ED Triage Notes (Signed)
Pt c/o headache, fever, vomiting, body aches and sore throat since yesterday. Seen at [facility] and left AMA. States unable to keep anything down

## 2021-02-13 NOTE — ED Notes (Signed)
Pt is presently at [facility]
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
## 2021-02-13 NOTE — ED Notes (Signed)
Pt has come back in requesting to speak with CN.

## 2021-02-13 NOTE — ED Triage Notes (Signed)
Pt reports generalized flu-like symptoms, including fever, n/v, body aches since yesterday.  Pt took ibuprofen 800mg  at approx 6am today.

## 2021-02-13 NOTE — ED Notes (Signed)
Upon going out to speak with pt as requested they were not present.

## 2021-02-13 NOTE — ED Notes (Signed)
Pt told registration she is leaving and left the building.

## 2021-02-14 ENCOUNTER — Other Ambulatory Visit (HOSPITAL_COMMUNITY): Payer: Self-pay

## 2021-02-20 ENCOUNTER — Other Ambulatory Visit (HOSPITAL_COMMUNITY): Payer: Self-pay

## 2021-02-20 DIAGNOSIS — G4733 Obstructive sleep apnea (adult) (pediatric): Secondary | ICD-10-CM | POA: Diagnosis not present

## 2021-02-20 DIAGNOSIS — E782 Mixed hyperlipidemia: Secondary | ICD-10-CM | POA: Diagnosis not present

## 2021-02-20 DIAGNOSIS — Z6841 Body Mass Index (BMI) 40.0 and over, adult: Secondary | ICD-10-CM | POA: Diagnosis not present

## 2021-02-20 DIAGNOSIS — J4550 Severe persistent asthma, uncomplicated: Secondary | ICD-10-CM | POA: Diagnosis not present

## 2021-02-20 DIAGNOSIS — R7303 Prediabetes: Secondary | ICD-10-CM | POA: Diagnosis not present

## 2021-02-20 DIAGNOSIS — K219 Gastro-esophageal reflux disease without esophagitis: Secondary | ICD-10-CM | POA: Diagnosis not present

## 2021-02-20 DIAGNOSIS — I10 Essential (primary) hypertension: Secondary | ICD-10-CM | POA: Diagnosis not present

## 2021-02-20 MED ORDER — PREDNISONE 20 MG PO TABS
ORAL_TABLET | ORAL | 0 refills | Status: DC
  Filled 2021-02-20: qty 10, 5d supply, fill #0

## 2021-02-20 MED ORDER — BENZONATATE 100 MG PO CAPS
ORAL_CAPSULE | ORAL | 0 refills | Status: DC
  Filled 2021-02-20: qty 15, 5d supply, fill #0

## 2021-02-20 MED ORDER — DILTIAZEM HCL ER 120 MG PO CP12
ORAL_CAPSULE | ORAL | 5 refills | Status: DC
  Filled 2021-02-20: qty 60, 30d supply, fill #0
  Filled 2021-04-06: qty 60, 30d supply, fill #1

## 2021-02-21 ENCOUNTER — Other Ambulatory Visit (HOSPITAL_COMMUNITY): Payer: Self-pay

## 2021-02-22 ENCOUNTER — Other Ambulatory Visit (HOSPITAL_COMMUNITY): Payer: Self-pay

## 2021-02-28 ENCOUNTER — Other Ambulatory Visit (HOSPITAL_COMMUNITY): Payer: Self-pay

## 2021-02-28 DIAGNOSIS — R7303 Prediabetes: Secondary | ICD-10-CM | POA: Diagnosis not present

## 2021-02-28 DIAGNOSIS — Z6841 Body Mass Index (BMI) 40.0 and over, adult: Secondary | ICD-10-CM | POA: Diagnosis not present

## 2021-02-28 MED ORDER — OZEMPIC (0.25 OR 0.5 MG/DOSE) 2 MG/1.5ML ~~LOC~~ SOPN
PEN_INJECTOR | SUBCUTANEOUS | 2 refills | Status: DC
  Filled 2021-04-06: qty 1.5, 28d supply, fill #0
  Filled 2021-05-09: qty 1.5, 28d supply, fill #1

## 2021-03-02 ENCOUNTER — Other Ambulatory Visit: Payer: Self-pay

## 2021-03-02 ENCOUNTER — Other Ambulatory Visit (HOSPITAL_COMMUNITY): Payer: Self-pay

## 2021-03-03 ENCOUNTER — Other Ambulatory Visit (HOSPITAL_COMMUNITY): Payer: Self-pay

## 2021-03-04 ENCOUNTER — Other Ambulatory Visit: Payer: Self-pay

## 2021-03-04 ENCOUNTER — Other Ambulatory Visit (HOSPITAL_COMMUNITY): Payer: Self-pay

## 2021-03-06 ENCOUNTER — Other Ambulatory Visit (HOSPITAL_COMMUNITY): Payer: Self-pay

## 2021-03-10 ENCOUNTER — Other Ambulatory Visit (HOSPITAL_COMMUNITY): Payer: Self-pay

## 2021-03-14 ENCOUNTER — Other Ambulatory Visit (HOSPITAL_COMMUNITY): Payer: Self-pay

## 2021-03-15 ENCOUNTER — Other Ambulatory Visit (HOSPITAL_COMMUNITY): Payer: Self-pay

## 2021-03-15 DIAGNOSIS — M1712 Unilateral primary osteoarthritis, left knee: Secondary | ICD-10-CM | POA: Diagnosis not present

## 2021-03-15 DIAGNOSIS — M1711 Unilateral primary osteoarthritis, right knee: Secondary | ICD-10-CM | POA: Diagnosis not present

## 2021-03-15 DIAGNOSIS — M170 Bilateral primary osteoarthritis of knee: Secondary | ICD-10-CM | POA: Diagnosis not present

## 2021-03-22 DIAGNOSIS — M1711 Unilateral primary osteoarthritis, right knee: Secondary | ICD-10-CM | POA: Diagnosis not present

## 2021-03-22 DIAGNOSIS — M25561 Pain in right knee: Secondary | ICD-10-CM | POA: Diagnosis not present

## 2021-03-23 ENCOUNTER — Other Ambulatory Visit (HOSPITAL_COMMUNITY): Payer: Self-pay

## 2021-03-24 ENCOUNTER — Other Ambulatory Visit (HOSPITAL_COMMUNITY): Payer: Self-pay

## 2021-03-27 ENCOUNTER — Other Ambulatory Visit (HOSPITAL_COMMUNITY): Payer: Self-pay

## 2021-03-29 ENCOUNTER — Other Ambulatory Visit (HOSPITAL_COMMUNITY): Payer: Self-pay

## 2021-03-30 ENCOUNTER — Other Ambulatory Visit (HOSPITAL_COMMUNITY): Payer: Self-pay

## 2021-03-30 MED ORDER — CYCLOBENZAPRINE HCL 5 MG PO TABS
ORAL_TABLET | ORAL | 1 refills | Status: DC
  Filled 2021-03-30: qty 90, 30d supply, fill #0

## 2021-04-03 ENCOUNTER — Other Ambulatory Visit: Payer: Self-pay

## 2021-04-03 ENCOUNTER — Other Ambulatory Visit (HOSPITAL_COMMUNITY): Payer: Self-pay

## 2021-04-03 ENCOUNTER — Emergency Department (HOSPITAL_BASED_OUTPATIENT_CLINIC_OR_DEPARTMENT_OTHER)
Admission: EM | Admit: 2021-04-03 | Discharge: 2021-04-03 | Disposition: A | Discharge status: Home / Self Care | Payer: 59 | Attending: Emergency Medicine | Admitting: Emergency Medicine

## 2021-04-03 ENCOUNTER — Encounter (HOSPITAL_BASED_OUTPATIENT_CLINIC_OR_DEPARTMENT_OTHER): Payer: Self-pay

## 2021-04-03 ENCOUNTER — Emergency Department (HOSPITAL_BASED_OUTPATIENT_CLINIC_OR_DEPARTMENT_OTHER): Payer: 59

## 2021-04-03 DIAGNOSIS — I10 Essential (primary) hypertension: Secondary | ICD-10-CM | POA: Diagnosis not present

## 2021-04-03 DIAGNOSIS — Z79899 Other long term (current) drug therapy: Secondary | ICD-10-CM | POA: Insufficient documentation

## 2021-04-03 DIAGNOSIS — Z7951 Long term (current) use of inhaled steroids: Secondary | ICD-10-CM | POA: Diagnosis not present

## 2021-04-03 DIAGNOSIS — Z041 Encounter for examination and observation following transport accident: Secondary | ICD-10-CM | POA: Diagnosis not present

## 2021-04-03 DIAGNOSIS — R29890 Loss of height: Secondary | ICD-10-CM | POA: Diagnosis not present

## 2021-04-03 DIAGNOSIS — J4550 Severe persistent asthma, uncomplicated: Secondary | ICD-10-CM | POA: Insufficient documentation

## 2021-04-03 DIAGNOSIS — M25511 Pain in right shoulder: Secondary | ICD-10-CM | POA: Insufficient documentation

## 2021-04-03 DIAGNOSIS — Z8616 Personal history of COVID-19: Secondary | ICD-10-CM | POA: Diagnosis not present

## 2021-04-03 DIAGNOSIS — Y92410 Unspecified street and highway as the place of occurrence of the external cause: Secondary | ICD-10-CM | POA: Diagnosis not present

## 2021-04-03 DIAGNOSIS — M2578 Osteophyte, vertebrae: Secondary | ICD-10-CM | POA: Diagnosis not present

## 2021-04-03 DIAGNOSIS — M546 Pain in thoracic spine: Secondary | ICD-10-CM | POA: Insufficient documentation

## 2021-04-03 DIAGNOSIS — R0789 Other chest pain: Secondary | ICD-10-CM | POA: Diagnosis not present

## 2021-04-03 DIAGNOSIS — M549 Dorsalgia, unspecified: Secondary | ICD-10-CM | POA: Diagnosis not present

## 2021-04-03 MED ORDER — ACETAMINOPHEN 500 MG PO TABS
1000.0000 mg | ORAL_TABLET | Freq: Once | ORAL | Status: AC
  Administered 2021-04-03: 1000 mg via ORAL
  Filled 2021-04-03: qty 2

## 2021-04-03 MED ORDER — CYCLOBENZAPRINE HCL 10 MG PO TABS
10.0000 mg | ORAL_TABLET | Freq: Two times a day (BID) | ORAL | 0 refills | Status: DC | PRN
  Filled 2021-04-03: qty 20, 10d supply, fill #0
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
## 2021-04-03 NOTE — Discharge Instructions (Addendum)
Take Tylenol as needed for pain.  You can take the Flexeril at night for muscle relaxing, do not take it before you drive as it will make you very drowsy.  Hope you feel better soon, follow-up with your PCP if still having pain in the next few weeks.
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
## 2021-04-03 NOTE — ED Notes (Signed)
Patient transported to X-ray 

## 2021-04-04 DIAGNOSIS — M2550 Pain in unspecified joint: Secondary | ICD-10-CM | POA: Diagnosis not present

## 2021-04-04 DIAGNOSIS — I10 Essential (primary) hypertension: Secondary | ICD-10-CM | POA: Diagnosis not present
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
## 2021-04-06 ENCOUNTER — Encounter: Payer: Self-pay | Admitting: Physical Therapy

## 2021-04-06 ENCOUNTER — Ambulatory Visit: Payer: 59 | Attending: Internal Medicine | Admitting: Physical Therapy

## 2021-04-06 ENCOUNTER — Telehealth: Payer: Self-pay | Admitting: Physical Therapy

## 2021-04-06 ENCOUNTER — Other Ambulatory Visit: Payer: Self-pay

## 2021-04-06 ENCOUNTER — Other Ambulatory Visit (HOSPITAL_COMMUNITY): Payer: Self-pay

## 2021-04-06 DIAGNOSIS — M25551 Pain in right hip: Secondary | ICD-10-CM | POA: Insufficient documentation

## 2021-04-06 DIAGNOSIS — G8929 Other chronic pain: Secondary | ICD-10-CM | POA: Insufficient documentation

## 2021-04-06 DIAGNOSIS — M25511 Pain in right shoulder: Secondary | ICD-10-CM | POA: Insufficient documentation

## 2021-04-06 DIAGNOSIS — M545 Low back pain, unspecified: Secondary | ICD-10-CM | POA: Diagnosis not present

## 2021-04-06 DIAGNOSIS — M546 Pain in thoracic spine: Secondary | ICD-10-CM | POA: Insufficient documentation

## 2021-04-06 DIAGNOSIS — M542 Cervicalgia: Secondary | ICD-10-CM | POA: Insufficient documentation

## 2021-04-06 NOTE — Patient Instructions (Signed)
Access Code: [email or web address] URL: [email or web address] Date: 04/06/2021 Prepared by: Hilda Blades  Exercises Supine Shoulder Flexion AAROM with Hands Clasped - 3 x daily - 7 x weekly - 5 reps Supine Lower Trunk Rotation - 3 x daily - 7 x weekly - 3 sets - 5 reps - 5 seconds hold Hooklying Single Knee to Chest Stretch - 3 x daily - 7 x weekly - 3 reps - 20 seconds hold Modified Sidelying Thoracic Rotation - 3 x daily - 7 x weekly - 5 reps - 5 seconds hold Clamshell - 3 x daily - 7 x weekly - 5 reps Seated Scapular Retraction - 3 x daily - 7 x weekly - 5 reps - 5 seconds hold Gentle Upper Trap Stretch - 3 x daily - 7 x weekly - 3 reps - 20 seconds hold

## 2021-04-06 NOTE — Telephone Encounter (Signed)
Spoke with Mrs Cousin regarding scheduling and expectations of staff and patient interactions, and that asking questions are encouraged to get the most of out treatment and what to expect with future visits.  I discussed that our scheduling policy is to schedule out about 4 weeks in advanced but her plan of care may extend beyond that and we can schedule more visits once she gets closer to that time.   Also, that as soon as we have some availability in the pool we will reach out to her to get her scheduled for more visits.  Cameryn Chrisley PT, DPT, LAT, ATC  04/06/21  1:16 PM

## 2021-04-07 ENCOUNTER — Other Ambulatory Visit (HOSPITAL_COMMUNITY): Payer: Self-pay

## 2021-04-11 ENCOUNTER — Encounter: Payer: Self-pay | Admitting: Physical Therapy
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
## 2021-04-12 ENCOUNTER — Encounter: Payer: Self-pay | Admitting: Physical Therapy

## 2021-04-12 ENCOUNTER — Other Ambulatory Visit: Payer: Self-pay

## 2021-04-12 ENCOUNTER — Ambulatory Visit: Payer: 59 | Admitting: Physical Therapy

## 2021-04-12 DIAGNOSIS — M546 Pain in thoracic spine: Secondary | ICD-10-CM

## 2021-04-12 DIAGNOSIS — M25511 Pain in right shoulder: Secondary | ICD-10-CM | POA: Diagnosis not present

## 2021-04-12 DIAGNOSIS — G8929 Other chronic pain: Secondary | ICD-10-CM | POA: Diagnosis not present

## 2021-04-12 DIAGNOSIS — M542 Cervicalgia: Secondary | ICD-10-CM

## 2021-04-12 DIAGNOSIS — M25551 Pain in right hip: Secondary | ICD-10-CM | POA: Diagnosis not present

## 2021-04-12 DIAGNOSIS — M545 Low back pain, unspecified: Secondary | ICD-10-CM | POA: Diagnosis not present

## 2021-04-13 ENCOUNTER — Encounter: Payer: Self-pay | Admitting: Physical Therapy

## 2021-04-13 ENCOUNTER — Other Ambulatory Visit: Payer: Self-pay

## 2021-04-13 ENCOUNTER — Ambulatory Visit: Payer: 59 | Admitting: Physical Therapy

## 2021-04-13 DIAGNOSIS — M25511 Pain in right shoulder: Secondary | ICD-10-CM | POA: Diagnosis not present

## 2021-04-13 DIAGNOSIS — M545 Low back pain, unspecified: Secondary | ICD-10-CM

## 2021-04-13 DIAGNOSIS — M546 Pain in thoracic spine: Secondary | ICD-10-CM | POA: Diagnosis not present

## 2021-04-13 DIAGNOSIS — M25551 Pain in right hip: Secondary | ICD-10-CM | POA: Diagnosis not present

## 2021-04-13 DIAGNOSIS — G8929 Other chronic pain: Secondary | ICD-10-CM | POA: Diagnosis not present

## 2021-04-13 DIAGNOSIS — M542 Cervicalgia: Secondary | ICD-10-CM
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
## 2021-04-14 ENCOUNTER — Ambulatory Visit: Payer: 59

## 2021-04-20 ENCOUNTER — Telehealth: Payer: Self-pay | Admitting: Rehabilitative and Restorative Service Providers"

## 2021-04-20 ENCOUNTER — Ambulatory Visit: Payer: 59 | Admitting: Rehabilitative and Restorative Service Providers"

## 2021-04-20 NOTE — Telephone Encounter (Signed)
Pt no showed for appt; PT called pt and she was apologetic and stated it was a late add on appt and she completely forgot. PT reminded her of her next appt; she stated she would be there.
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
## 2021-04-26 ENCOUNTER — Ambulatory Visit: Payer: 59 | Attending: Internal Medicine | Admitting: Physical Therapy

## 2021-04-26 ENCOUNTER — Other Ambulatory Visit: Payer: Self-pay

## 2021-04-26 ENCOUNTER — Encounter: Payer: Self-pay | Admitting: Physical Therapy

## 2021-04-26 DIAGNOSIS — M25551 Pain in right hip: Secondary | ICD-10-CM | POA: Insufficient documentation

## 2021-04-26 DIAGNOSIS — M542 Cervicalgia: Secondary | ICD-10-CM | POA: Diagnosis present

## 2021-04-26 DIAGNOSIS — G8929 Other chronic pain: Secondary | ICD-10-CM | POA: Diagnosis not present

## 2021-04-26 DIAGNOSIS — M546 Pain in thoracic spine: Secondary | ICD-10-CM | POA: Insufficient documentation

## 2021-04-26 DIAGNOSIS — M25511 Pain in right shoulder: Secondary | ICD-10-CM | POA: Insufficient documentation

## 2021-04-26 DIAGNOSIS — M545 Low back pain, unspecified: Secondary | ICD-10-CM | POA: Insufficient documentation
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
## 2021-05-01 ENCOUNTER — Encounter: Payer: Self-pay | Admitting: Physical Therapy

## 2021-05-01 ENCOUNTER — Other Ambulatory Visit: Payer: Self-pay

## 2021-05-01 ENCOUNTER — Ambulatory Visit: Payer: 59 | Admitting: Physical Therapy

## 2021-05-01 DIAGNOSIS — M546 Pain in thoracic spine: Secondary | ICD-10-CM | POA: Diagnosis not present

## 2021-05-01 DIAGNOSIS — G8929 Other chronic pain: Secondary | ICD-10-CM | POA: Diagnosis not present

## 2021-05-01 DIAGNOSIS — M25511 Pain in right shoulder: Secondary | ICD-10-CM

## 2021-05-01 DIAGNOSIS — M542 Cervicalgia: Secondary | ICD-10-CM | POA: Diagnosis not present

## 2021-05-01 DIAGNOSIS — M25551 Pain in right hip: Secondary | ICD-10-CM

## 2021-05-01 DIAGNOSIS — M545 Low back pain, unspecified: Secondary | ICD-10-CM
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
## 2021-05-03 ENCOUNTER — Encounter: Payer: Self-pay | Admitting: Physical Therapy

## 2021-05-03 ENCOUNTER — Other Ambulatory Visit: Payer: Self-pay

## 2021-05-03 ENCOUNTER — Ambulatory Visit: Payer: 59 | Admitting: Physical Therapy

## 2021-05-03 ENCOUNTER — Ambulatory Visit (HOSPITAL_BASED_OUTPATIENT_CLINIC_OR_DEPARTMENT_OTHER): Payer: 59 | Admitting: Physical Therapy

## 2021-05-03 DIAGNOSIS — M545 Low back pain, unspecified: Secondary | ICD-10-CM | POA: Diagnosis not present

## 2021-05-03 DIAGNOSIS — M542 Cervicalgia: Secondary | ICD-10-CM | POA: Diagnosis not present

## 2021-05-03 DIAGNOSIS — G8929 Other chronic pain: Secondary | ICD-10-CM

## 2021-05-03 DIAGNOSIS — M25551 Pain in right hip: Secondary | ICD-10-CM | POA: Diagnosis not present

## 2021-05-03 DIAGNOSIS — M546 Pain in thoracic spine: Secondary | ICD-10-CM | POA: Diagnosis not present

## 2021-05-03 DIAGNOSIS — M25511 Pain in right shoulder: Secondary | ICD-10-CM | POA: Diagnosis not present
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
## 2021-05-09 ENCOUNTER — Other Ambulatory Visit (HOSPITAL_COMMUNITY): Payer: Self-pay

## 2021-05-11 DIAGNOSIS — M79661 Pain in right lower leg: Secondary | ICD-10-CM | POA: Diagnosis not present
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
## 2021-05-12 ENCOUNTER — Ambulatory Visit: Payer: 59 | Admitting: Physical Therapy

## 2021-05-12 ENCOUNTER — Other Ambulatory Visit: Payer: Self-pay

## 2021-05-12 ENCOUNTER — Encounter: Payer: Self-pay | Admitting: Physical Therapy

## 2021-05-12 DIAGNOSIS — M546 Pain in thoracic spine: Secondary | ICD-10-CM

## 2021-05-12 DIAGNOSIS — M542 Cervicalgia: Secondary | ICD-10-CM

## 2021-05-12 DIAGNOSIS — G8929 Other chronic pain: Secondary | ICD-10-CM

## 2021-05-12 DIAGNOSIS — M545 Low back pain, unspecified: Secondary | ICD-10-CM

## 2021-05-12 DIAGNOSIS — M25511 Pain in right shoulder: Secondary | ICD-10-CM | POA: Diagnosis not present

## 2021-05-12 DIAGNOSIS — M25551 Pain in right hip: Secondary | ICD-10-CM | POA: Diagnosis not present
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
## 2021-05-12 NOTE — Patient Instructions (Signed)
Access Code: [email or web address] URL: [email or web address] Date: 05/12/2021 Prepared by: Hilda Blades  Exercises Supine Shoulder Flexion AAROM with Hands Clasped - 3 x daily - 7 x weekly - 5 reps Supine Lower Trunk Rotation - 3 x daily - 7 x weekly - 3 sets - 5 reps - 5 seconds hold Hooklying Single Knee to Chest Stretch - 3 x daily - 7 x weekly - 3 reps - 20 seconds hold Modified Sidelying Thoracic Rotation - 3 x daily - 7 x weekly - 5 reps - 5 seconds hold Clamshell - 3 x daily - 7 x weekly - 5 reps Seated Scapular Retraction - 3 x daily - 7 x weekly - 5 reps - 5 seconds hold Gentle Upper Trap Stretch - 3 x daily - 7 x weekly - 3 reps - 20 seconds hold Sidelying Shoulder Abduction Palm Forward - 1 x daily - 7 x weekly - 2 sets - 10 reps - 5 seconds hold Standing shoulder flexion wall slides - 1 x daily - 7 x weekly - 2 sets - 10 reps - 5 seconds hold

## 2021-05-15 ENCOUNTER — Ambulatory Visit: Payer: 59 | Admitting: Physical Therapy
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
## 2021-05-18 ENCOUNTER — Encounter: Payer: Self-pay | Admitting: Physical Therapy

## 2021-05-18 ENCOUNTER — Other Ambulatory Visit: Payer: Self-pay

## 2021-05-18 ENCOUNTER — Ambulatory Visit: Payer: 59 | Admitting: Physical Therapy

## 2021-05-18 DIAGNOSIS — M542 Cervicalgia: Secondary | ICD-10-CM | POA: Diagnosis not present

## 2021-05-18 DIAGNOSIS — M545 Low back pain, unspecified: Secondary | ICD-10-CM | POA: Diagnosis not present

## 2021-05-18 DIAGNOSIS — G8929 Other chronic pain: Secondary | ICD-10-CM

## 2021-05-18 DIAGNOSIS — M25551 Pain in right hip: Secondary | ICD-10-CM | POA: Diagnosis not present

## 2021-05-18 DIAGNOSIS — M25511 Pain in right shoulder: Secondary | ICD-10-CM | POA: Diagnosis not present

## 2021-05-18 DIAGNOSIS — M546 Pain in thoracic spine: Secondary | ICD-10-CM | POA: Diagnosis not present
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
## 2021-05-21 DIAGNOSIS — H5203 Hypermetropia, bilateral: Secondary | ICD-10-CM | POA: Diagnosis not present

## 2021-05-22 ENCOUNTER — Ambulatory Visit: Payer: 59 | Admitting: Physical Therapy

## 2021-05-22 ENCOUNTER — Telehealth: Payer: Self-pay | Admitting: Physical Therapy

## 2021-05-22 NOTE — Telephone Encounter (Signed)
Spoke with patient regarding missed PT appointment. She stated she initially came at the wrong time and then was called into a mandatory meeting during the appointment time so was unable to come. Patient elected to reschedule the appointment for Feb 3rd at 8:30am. Patient reminded of attendance policy and she expressed understanding.  Hilda Blades, PT, DPT, LAT, ATC 05/22/21  2:59 PM Phone: [phone] Fax: [phone]

## 2021-05-23 ENCOUNTER — Other Ambulatory Visit (HOSPITAL_COMMUNITY): Payer: Self-pay

## 2021-05-23 DIAGNOSIS — J4540 Moderate persistent asthma, uncomplicated: Secondary | ICD-10-CM | POA: Diagnosis not present

## 2021-05-23 DIAGNOSIS — Z6841 Body Mass Index (BMI) 40.0 and over, adult: Secondary | ICD-10-CM | POA: Diagnosis not present

## 2021-05-23 DIAGNOSIS — R7303 Prediabetes: Secondary | ICD-10-CM | POA: Diagnosis not present

## 2021-05-23 MED ORDER — OZEMPIC (1 MG/DOSE) 4 MG/3ML ~~LOC~~ SOPN
PEN_INJECTOR | SUBCUTANEOUS | 0 refills | Status: DC
  Filled 2021-05-23: qty 9, 84d supply, fill #0

## 2021-05-24 ENCOUNTER — Ambulatory Visit: Payer: 59 | Admitting: Physical Therapy

## 2021-05-24 ENCOUNTER — Other Ambulatory Visit (HOSPITAL_COMMUNITY): Payer: Self-pay
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
## 2021-05-26 ENCOUNTER — Other Ambulatory Visit: Payer: Self-pay

## 2021-05-26 ENCOUNTER — Ambulatory Visit: Payer: 59 | Attending: Internal Medicine | Admitting: Physical Therapy

## 2021-05-26 ENCOUNTER — Encounter: Payer: Self-pay | Admitting: Physical Therapy

## 2021-05-26 DIAGNOSIS — M546 Pain in thoracic spine: Secondary | ICD-10-CM | POA: Insufficient documentation

## 2021-05-26 DIAGNOSIS — M545 Low back pain, unspecified: Secondary | ICD-10-CM | POA: Insufficient documentation

## 2021-05-26 DIAGNOSIS — M25511 Pain in right shoulder: Secondary | ICD-10-CM | POA: Insufficient documentation

## 2021-05-26 DIAGNOSIS — G8929 Other chronic pain: Secondary | ICD-10-CM | POA: Insufficient documentation

## 2021-05-26 DIAGNOSIS — M25551 Pain in right hip: Secondary | ICD-10-CM | POA: Insufficient documentation

## 2021-05-26 DIAGNOSIS — M542 Cervicalgia: Secondary | ICD-10-CM | POA: Insufficient documentation

## 2021-05-29 ENCOUNTER — Ambulatory Visit: Payer: 59 | Admitting: Physical Therapy

## 2021-05-29 ENCOUNTER — Other Ambulatory Visit: Payer: Self-pay

## 2021-05-29 ENCOUNTER — Encounter: Payer: Self-pay | Admitting: Physical Therapy

## 2021-05-29 DIAGNOSIS — M25551 Pain in right hip: Secondary | ICD-10-CM | POA: Diagnosis not present

## 2021-05-29 DIAGNOSIS — M546 Pain in thoracic spine: Secondary | ICD-10-CM | POA: Diagnosis not present

## 2021-05-29 DIAGNOSIS — M542 Cervicalgia: Secondary | ICD-10-CM | POA: Diagnosis not present

## 2021-05-29 DIAGNOSIS — M545 Low back pain, unspecified: Secondary | ICD-10-CM | POA: Diagnosis not present

## 2021-05-29 DIAGNOSIS — M25511 Pain in right shoulder: Secondary | ICD-10-CM | POA: Diagnosis not present

## 2021-05-29 DIAGNOSIS — G8929 Other chronic pain: Secondary | ICD-10-CM

## 2021-05-29 NOTE — Patient Instructions (Signed)
Access Code: [email or web address] URL: [email or web address] Date: 05/29/2021 Prepared by: Hilda Blades  Exercises Supine Lower Trunk Rotation - 3 x daily - 7 x weekly - 3 sets - 5 reps - 5 seconds hold Hooklying Single Knee to Chest Stretch - 3 x daily - 7 x weekly - 3 reps - 20 seconds hold Clamshell - 3 x daily - 7 x weekly - 5 reps Gentle Upper Trap Stretch - 3 x daily - 7 x weekly - 3 reps - 20 seconds hold Sidelying Shoulder Abduction Palm Forward - 1 x daily - 7 x weekly - 2 sets - 10 reps - 5 seconds hold Standing shoulder flexion wall slides - 1 x daily - 7 x weekly - 2 sets - 10 reps - 5 seconds hold Standing Row with Anchored Resistance - 1 x daily - 7 x weekly - 2 sets - 15 reps Shoulder External Rotation with Anchored Resistance - 1 x daily - 7 x weekly - 2 sets - 10 reps Wall Push Up - 1 x daily - 7 x weekly - 3 sets - 5 reps
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
## 2021-05-31 ENCOUNTER — Other Ambulatory Visit: Payer: Self-pay

## 2021-05-31 ENCOUNTER — Ambulatory Visit: Payer: 59 | Admitting: Physical Therapy

## 2021-05-31 ENCOUNTER — Encounter: Payer: Self-pay | Admitting: Physical Therapy

## 2021-05-31 DIAGNOSIS — M546 Pain in thoracic spine: Secondary | ICD-10-CM | POA: Diagnosis not present

## 2021-05-31 DIAGNOSIS — M545 Low back pain, unspecified: Secondary | ICD-10-CM | POA: Diagnosis not present

## 2021-05-31 DIAGNOSIS — M542 Cervicalgia: Secondary | ICD-10-CM | POA: Diagnosis not present

## 2021-05-31 DIAGNOSIS — M25511 Pain in right shoulder: Secondary | ICD-10-CM | POA: Diagnosis not present

## 2021-05-31 DIAGNOSIS — M25551 Pain in right hip: Secondary | ICD-10-CM

## 2021-05-31 DIAGNOSIS — G8929 Other chronic pain: Secondary | ICD-10-CM | POA: Diagnosis not present

## 2021-05-31 NOTE — Patient Instructions (Signed)
Access Code: [email or web address] URL: [email or web address] Date: 05/30/2021 Prepared by: Voncille Lo  Exercises Bilateral Shoulder Horizontal Abduction Adduction AROM - 1 x daily - 7 x weekly - 3 sets - 10 reps Bilateral Shoulder Flexion Extension with Hand Floats at [employer] - 1 x daily - 7 x weekly - 3 sets - 10 reps Reach and Pull - 1 x daily - 7 x weekly - 3 sets - 10 reps Standing Hip Abduction Adduction at [place] - 1 x daily - 7 x weekly - 3 sets - 10 reps Standing Hip Flexion Extension at [employer] - 1 x daily - 7 x weekly - 3 sets - 10 reps Abdominal Curls Center with Upper Extremity Flotation - 1 x daily - 7 x weekly - 3 sets - 10 reps Squat - 1 x daily - 7 x weekly - 3 sets - 10 reps Alternating Forward Lunge - 1 x daily - 7 x weekly - 3 sets - 10 reps  Voncille Lo, PT, [place] Certified Exercise Expert for the Aging Adult  05/31/21 1:39 PM Phone: [phone] Fax: [phone]

## 2021-06-05 ENCOUNTER — Ambulatory Visit: Payer: 59 | Admitting: Physical Therapy

## 2021-06-05 ENCOUNTER — Other Ambulatory Visit: Payer: Self-pay

## 2021-06-05 ENCOUNTER — Encounter: Payer: Self-pay | Admitting: Physical Therapy

## 2021-06-05 DIAGNOSIS — M545 Low back pain, unspecified: Secondary | ICD-10-CM | POA: Diagnosis not present

## 2021-06-05 DIAGNOSIS — M542 Cervicalgia: Secondary | ICD-10-CM

## 2021-06-05 DIAGNOSIS — M25511 Pain in right shoulder: Secondary | ICD-10-CM | POA: Diagnosis not present

## 2021-06-05 DIAGNOSIS — M25551 Pain in right hip: Secondary | ICD-10-CM | POA: Diagnosis not present

## 2021-06-05 DIAGNOSIS — G8929 Other chronic pain: Secondary | ICD-10-CM | POA: Diagnosis not present

## 2021-06-05 DIAGNOSIS — M546 Pain in thoracic spine: Secondary | ICD-10-CM | POA: Diagnosis not present
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
## 2021-06-07 ENCOUNTER — Other Ambulatory Visit: Payer: Self-pay

## 2021-06-07 ENCOUNTER — Ambulatory Visit: Payer: 59 | Admitting: Physical Therapy

## 2021-06-07 ENCOUNTER — Encounter: Payer: Self-pay | Admitting: Physical Therapy

## 2021-06-07 DIAGNOSIS — M546 Pain in thoracic spine: Secondary | ICD-10-CM

## 2021-06-07 DIAGNOSIS — M542 Cervicalgia: Secondary | ICD-10-CM

## 2021-06-07 DIAGNOSIS — M25511 Pain in right shoulder: Secondary | ICD-10-CM

## 2021-06-07 DIAGNOSIS — M545 Low back pain, unspecified: Secondary | ICD-10-CM

## 2021-06-07 DIAGNOSIS — G8929 Other chronic pain: Secondary | ICD-10-CM

## 2021-06-07 DIAGNOSIS — M25551 Pain in right hip: Secondary | ICD-10-CM
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
## 2021-06-12 ENCOUNTER — Other Ambulatory Visit: Payer: Self-pay

## 2021-06-12 ENCOUNTER — Encounter: Payer: Self-pay | Admitting: Physical Therapy

## 2021-06-12 ENCOUNTER — Ambulatory Visit: Payer: 59 | Admitting: Physical Therapy

## 2021-06-12 DIAGNOSIS — M25551 Pain in right hip: Secondary | ICD-10-CM

## 2021-06-12 DIAGNOSIS — M545 Low back pain, unspecified: Secondary | ICD-10-CM

## 2021-06-12 DIAGNOSIS — M546 Pain in thoracic spine: Secondary | ICD-10-CM | POA: Diagnosis not present

## 2021-06-12 DIAGNOSIS — G8929 Other chronic pain: Secondary | ICD-10-CM

## 2021-06-12 DIAGNOSIS — M542 Cervicalgia: Secondary | ICD-10-CM | POA: Diagnosis not present

## 2021-06-12 DIAGNOSIS — M25511 Pain in right shoulder: Secondary | ICD-10-CM | POA: Diagnosis not present

## 2021-06-13 ENCOUNTER — Telehealth: Payer: Self-pay | Admitting: Internal Medicine

## 2021-06-13 ENCOUNTER — Other Ambulatory Visit (HOSPITAL_COMMUNITY): Payer: Self-pay

## 2021-06-13 MED ORDER — HYDROCOD POLI-CHLORPHE POLI ER 10-8 MG/5ML PO SUER
ORAL | 0 refills | Status: DC
  Filled 2021-06-13: qty 115, 12d supply, fill #0

## 2021-06-13 MED ORDER — IBUPROFEN 800 MG PO TABS
800.0000 mg | ORAL_TABLET | Freq: Three times a day (TID) | ORAL | 1 refills | Status: DC | PRN
  Filled 2021-06-13: qty 90, 30d supply, fill #0
  Filled 2021-12-17: qty 90, 30d supply, fill #1

## 2021-06-13 MED FILL — Lorazepam Tab 1 MG: ORAL | 10 days supply | Qty: 30 | Fill #1 | Status: AC
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
## 2021-06-13 NOTE — Telephone Encounter (Signed)
[facility] pharmacy reported request for Tussionex refill - done

## 2021-06-14 ENCOUNTER — Other Ambulatory Visit: Payer: Self-pay

## 2021-06-14 ENCOUNTER — Encounter: Payer: Self-pay | Admitting: Physical Therapy

## 2021-06-14 ENCOUNTER — Ambulatory Visit: Payer: 59 | Admitting: Physical Therapy

## 2021-06-14 ENCOUNTER — Other Ambulatory Visit (HOSPITAL_COMMUNITY): Payer: Self-pay

## 2021-06-14 DIAGNOSIS — M545 Low back pain, unspecified: Secondary | ICD-10-CM | POA: Diagnosis not present

## 2021-06-14 DIAGNOSIS — M25511 Pain in right shoulder: Secondary | ICD-10-CM

## 2021-06-14 DIAGNOSIS — M25551 Pain in right hip: Secondary | ICD-10-CM | POA: Diagnosis not present

## 2021-06-14 DIAGNOSIS — M546 Pain in thoracic spine: Secondary | ICD-10-CM

## 2021-06-14 DIAGNOSIS — M542 Cervicalgia: Secondary | ICD-10-CM

## 2021-06-14 DIAGNOSIS — G8929 Other chronic pain: Secondary | ICD-10-CM

## 2021-06-15 ENCOUNTER — Other Ambulatory Visit (HOSPITAL_COMMUNITY): Payer: Self-pay
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
## 2021-06-19 ENCOUNTER — Encounter: Payer: Self-pay | Admitting: Physical Therapy

## 2021-06-19 ENCOUNTER — Other Ambulatory Visit: Payer: Self-pay

## 2021-06-19 ENCOUNTER — Ambulatory Visit: Payer: 59 | Admitting: Physical Therapy

## 2021-06-19 DIAGNOSIS — M25511 Pain in right shoulder: Secondary | ICD-10-CM | POA: Diagnosis not present

## 2021-06-19 DIAGNOSIS — G8929 Other chronic pain: Secondary | ICD-10-CM

## 2021-06-19 DIAGNOSIS — M546 Pain in thoracic spine: Secondary | ICD-10-CM | POA: Diagnosis not present

## 2021-06-19 DIAGNOSIS — M542 Cervicalgia: Secondary | ICD-10-CM | POA: Diagnosis not present

## 2021-06-19 DIAGNOSIS — M545 Low back pain, unspecified: Secondary | ICD-10-CM | POA: Diagnosis not present

## 2021-06-19 DIAGNOSIS — M25551 Pain in right hip: Secondary | ICD-10-CM
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
## 2021-06-21 ENCOUNTER — Ambulatory Visit: Payer: 59 | Attending: Internal Medicine | Admitting: Physical Therapy

## 2021-06-21 ENCOUNTER — Other Ambulatory Visit: Payer: Self-pay

## 2021-06-21 ENCOUNTER — Encounter: Payer: Self-pay | Admitting: Physical Therapy

## 2021-06-21 DIAGNOSIS — G8929 Other chronic pain: Secondary | ICD-10-CM | POA: Diagnosis present

## 2021-06-21 DIAGNOSIS — M25511 Pain in right shoulder: Secondary | ICD-10-CM | POA: Insufficient documentation

## 2021-06-21 DIAGNOSIS — M545 Low back pain, unspecified: Secondary | ICD-10-CM | POA: Diagnosis present

## 2021-06-21 DIAGNOSIS — M546 Pain in thoracic spine: Secondary | ICD-10-CM | POA: Insufficient documentation

## 2021-06-21 DIAGNOSIS — M25551 Pain in right hip: Secondary | ICD-10-CM | POA: Diagnosis not present

## 2021-06-21 DIAGNOSIS — M542 Cervicalgia: Secondary | ICD-10-CM | POA: Diagnosis not present

## 2021-06-23 ENCOUNTER — Other Ambulatory Visit (HOSPITAL_COMMUNITY): Payer: Self-pay

## 2021-06-23 DIAGNOSIS — R7303 Prediabetes: Secondary | ICD-10-CM | POA: Diagnosis not present

## 2021-06-23 DIAGNOSIS — J4550 Severe persistent asthma, uncomplicated: Secondary | ICD-10-CM | POA: Diagnosis not present

## 2021-06-23 DIAGNOSIS — T07XXXA Unspecified multiple injuries, initial encounter: Secondary | ICD-10-CM | POA: Diagnosis not present

## 2021-06-23 DIAGNOSIS — M2550 Pain in unspecified joint: Secondary | ICD-10-CM | POA: Diagnosis not present

## 2021-06-23 DIAGNOSIS — K219 Gastro-esophageal reflux disease without esophagitis: Secondary | ICD-10-CM | POA: Diagnosis not present

## 2021-06-23 DIAGNOSIS — I10 Essential (primary) hypertension: Secondary | ICD-10-CM | POA: Diagnosis not present

## 2021-06-23 DIAGNOSIS — E782 Mixed hyperlipidemia: Secondary | ICD-10-CM | POA: Diagnosis not present

## 2021-06-23 MED ORDER — HYDROCHLOROTHIAZIDE 25 MG PO TABS
ORAL_TABLET | ORAL | 1 refills | Status: DC
  Filled 2021-06-23: qty 90, 90d supply, fill #0
  Filled 2021-09-12: qty 90, 90d supply, fill #1

## 2021-06-23 MED ORDER — PANTOPRAZOLE SODIUM 40 MG PO TBEC
DELAYED_RELEASE_TABLET | ORAL | 3 refills | Status: DC
  Filled 2021-06-23: qty 90, 90d supply, fill #0

## 2021-06-23 MED ORDER — DILTIAZEM HCL ER COATED BEADS 300 MG PO CP24
ORAL_CAPSULE | ORAL | 1 refills | Status: DC
  Filled 2021-06-23: qty 90, 90d supply, fill #0

## 2021-06-26 ENCOUNTER — Other Ambulatory Visit (HOSPITAL_COMMUNITY): Payer: Self-pay

## 2021-06-26 ENCOUNTER — Ambulatory Visit: Payer: 59 | Admitting: Physical Therapy

## 2021-06-26 MED FILL — Lorazepam Tab 1 MG: ORAL | 10 days supply | Qty: 30 | Fill #2 | Status: AC
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
## 2021-06-27 ENCOUNTER — Other Ambulatory Visit (HOSPITAL_COMMUNITY): Payer: Self-pay

## 2021-06-27 ENCOUNTER — Telehealth: Payer: Self-pay | Admitting: Physical Therapy
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
## 2021-06-27 NOTE — Telephone Encounter (Signed)
Attempted to contact patient regarding missed PT appointment on 06/26/2021. Left VM informing patient of missed appointment, reminder of next scheduled appointment, and advised of attendance policy. ? ?Hilda Blades, PT, DPT, LAT, ATC ?06/27/21  7:53 AM ?Phone: [phone] ?Fax: [phone] ? ?

## 2021-06-28 ENCOUNTER — Ambulatory Visit: Payer: 59 | Admitting: Internal Medicine

## 2021-06-28 ENCOUNTER — Ambulatory Visit: Payer: 59 | Admitting: Physical Therapy
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
## 2021-06-29 ENCOUNTER — Other Ambulatory Visit (HOSPITAL_COMMUNITY): Payer: Self-pay

## 2021-06-29 ENCOUNTER — Other Ambulatory Visit: Payer: Self-pay

## 2021-06-29 ENCOUNTER — Encounter: Payer: Self-pay | Admitting: Internal Medicine

## 2021-06-29 ENCOUNTER — Ambulatory Visit (INDEPENDENT_AMBULATORY_CARE_PROVIDER_SITE_OTHER): Payer: 59 | Admitting: Internal Medicine

## 2021-06-29 DIAGNOSIS — J3089 Other allergic rhinitis: Secondary | ICD-10-CM

## 2021-06-29 DIAGNOSIS — J4550 Severe persistent asthma, uncomplicated: Secondary | ICD-10-CM | POA: Diagnosis not present

## 2021-06-29 DIAGNOSIS — J302 Other seasonal allergic rhinitis: Secondary | ICD-10-CM

## 2021-06-29 DIAGNOSIS — U099 Post covid-19 condition, unspecified: Secondary | ICD-10-CM | POA: Diagnosis not present

## 2021-06-29 DIAGNOSIS — R438 Other disturbances of smell and taste: Secondary | ICD-10-CM | POA: Diagnosis not present

## 2021-06-29 MED ORDER — EPINEPHRINE 0.3 MG/0.3ML IJ SOAJ
INTRAMUSCULAR | 12 refills | Status: DC
  Filled 2021-06-29 – 2021-12-17 (×3): qty 2, 30d supply, fill #0

## 2021-06-29 MED ORDER — AZELASTINE-FLUTICASONE 137-50 MCG/ACT NA SUSP
NASAL | 5 refills | Status: DC
  Filled 2021-06-29: qty 23, 30d supply, fill #0

## 2021-06-29 MED ORDER — ESOMEPRAZOLE MAGNESIUM 20 MG PO CPDR
DELAYED_RELEASE_CAPSULE | ORAL | 5 refills | Status: DC
  Filled 2021-06-29 – 2021-12-17 (×2): qty 30, 30d supply, fill #0
  Filled 2022-04-19: qty 30, 30d supply, fill #1

## 2021-06-29 MED ORDER — BREZTRI AEROSPHERE 160-9-4.8 MCG/ACT IN AERO
2.0000 | INHALATION_SPRAY | Freq: Two times a day (BID) | RESPIRATORY_TRACT | 12 refills | Status: DC
  Filled 2021-06-29: qty 10.7, 30d supply, fill #0

## 2021-06-29 MED ORDER — ALBUTEROL SULFATE (2.5 MG/3ML) 0.083% IN NEBU
INHALATION_SOLUTION | RESPIRATORY_TRACT | 12 refills | Status: DC
  Filled 2021-06-29: qty 90, 7d supply, fill #0

## 2021-06-29 MED ORDER — OLOPATADINE HCL 0.1 % OP SOLN
OPHTHALMIC | 5 refills | Status: DC
  Filled 2021-06-29: qty 5, 25d supply, fill #0
  Filled 2021-12-17 – 2021-12-18 (×2): qty 5, 30d supply, fill #0

## 2021-06-29 MED ORDER — LEVOCETIRIZINE DIHYDROCHLORIDE 5 MG PO TABS
5.0000 mg | ORAL_TABLET | Freq: Every evening | ORAL | 5 refills | Status: DC
  Filled 2021-06-29: qty 30, 30d supply, fill #0
  Filled 2021-12-17: qty 30, 30d supply, fill #1

## 2021-06-29 MED ORDER — MONTELUKAST SODIUM 10 MG PO TABS
10.0000 mg | ORAL_TABLET | Freq: Every day | ORAL | 10 refills | Status: DC
  Filled 2021-06-29: qty 30, 30d supply, fill #0
  Filled 2021-12-17: qty 30, 30d supply, fill #1

## 2021-06-29 MED ORDER — HYDROCOD POLI-CHLORPHE POLI ER 10-8 MG/5ML PO SUER
ORAL | 0 refills | Status: DC
  Filled 2021-06-29: qty 115, 12d supply, fill #0

## 2021-06-29 MED ORDER — BENZONATATE 100 MG PO CAPS
ORAL_CAPSULE | ORAL | 0 refills | Status: DC
  Filled 2021-06-29: qty 15, 5d supply, fill #0

## 2021-06-29 MED ORDER — DM-GUAIFENESIN ER 30-600 MG PO TB12
1.0000 | ORAL_TABLET | Freq: Two times a day (BID) | ORAL | 0 refills | Status: DC
  Filled 2021-06-29: qty 10, 5d supply, fill #0

## 2021-06-29 MED ORDER — ALBUTEROL SULFATE HFA 108 (90 BASE) MCG/ACT IN AERS
INHALATION_SPRAY | RESPIRATORY_TRACT | 12 refills | Status: AC
  Filled 2021-06-29: qty 18, 25d supply, fill #0

## 2021-06-29 MED ORDER — CYCLOBENZAPRINE HCL 5 MG PO TABS
5.0000 mg | ORAL_TABLET | Freq: Three times a day (TID) | ORAL | 1 refills | Status: DC | PRN
  Filled 2021-06-29: qty 30, 10d supply, fill #0
  Filled 2021-11-28: qty 30, 10d supply, fill #1

## 2021-06-29 NOTE — Patient Instructions (Signed)
Meds refilled  Please call if we can help 

## 2021-06-30 ENCOUNTER — Other Ambulatory Visit (HOSPITAL_COMMUNITY): Payer: Self-pay

## 2021-06-30 DIAGNOSIS — M25511 Pain in right shoulder: Secondary | ICD-10-CM | POA: Diagnosis not present

## 2021-07-03 ENCOUNTER — Other Ambulatory Visit (HOSPITAL_COMMUNITY): Payer: Self-pay
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
## 2021-07-04 ENCOUNTER — Ambulatory Visit: Payer: 59 | Admitting: Physical Therapy

## 2021-07-04 ENCOUNTER — Telehealth: Payer: Self-pay | Admitting: Pharmacy Technician

## 2021-07-04 ENCOUNTER — Other Ambulatory Visit (HOSPITAL_COMMUNITY): Payer: Self-pay

## 2021-07-04 NOTE — Telephone Encounter (Signed)
Patient Advocate Encounter ? ?Received notification from [facility]) that prior authorization for [facility] is required. ?  ?PA submitted on 3.14.23 ?Key [id number] ?Status is pending ?  ? Clinic will continue to follow ? ?Sequoya Hogsett R Trinna Kunst, CPhT ?Patient Advocate ?Phone: [phone] ?Fax:  [phone] ? ?
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
## 2021-07-05 ENCOUNTER — Ambulatory Visit: Payer: 59 | Admitting: Physical Therapy

## 2021-07-05 ENCOUNTER — Other Ambulatory Visit (HOSPITAL_COMMUNITY): Payer: Self-pay

## 2021-07-06 ENCOUNTER — Encounter: Payer: Self-pay | Admitting: Physical Therapy

## 2021-07-06 ENCOUNTER — Other Ambulatory Visit: Payer: Self-pay

## 2021-07-06 ENCOUNTER — Ambulatory Visit: Payer: 59 | Admitting: Physical Therapy

## 2021-07-06 ENCOUNTER — Other Ambulatory Visit (HOSPITAL_COMMUNITY): Payer: Self-pay

## 2021-07-06 DIAGNOSIS — M542 Cervicalgia: Secondary | ICD-10-CM | POA: Diagnosis not present

## 2021-07-06 DIAGNOSIS — M545 Low back pain, unspecified: Secondary | ICD-10-CM

## 2021-07-06 DIAGNOSIS — M546 Pain in thoracic spine: Secondary | ICD-10-CM

## 2021-07-06 DIAGNOSIS — M25551 Pain in right hip: Secondary | ICD-10-CM | POA: Diagnosis not present

## 2021-07-06 DIAGNOSIS — G8929 Other chronic pain: Secondary | ICD-10-CM

## 2021-07-06 DIAGNOSIS — M25511 Pain in right shoulder: Secondary | ICD-10-CM | POA: Diagnosis not present

## 2021-07-07 ENCOUNTER — Other Ambulatory Visit (HOSPITAL_COMMUNITY): Payer: Self-pay

## 2021-07-10 ENCOUNTER — Other Ambulatory Visit (HOSPITAL_COMMUNITY): Payer: Self-pay

## 2021-07-10 NOTE — Telephone Encounter (Signed)
Patient Advocate Encounter ? ?Prior Authorization for Azelastine-Fluticasone 137-65mg has been approved.   ? ?PA# [id number]? ?Effective dates: 07/08/21 through 07/08/22 ? ?Per Test Claim Patients co-pay is $15.  ? ?Spoke with Pharmacy to Process. ? ?Patient Advocate ?Fax: [phone] ?

## 2021-07-11 ENCOUNTER — Other Ambulatory Visit (HOSPITAL_COMMUNITY): Payer: Self-pay

## 2021-07-11 ENCOUNTER — Encounter: Payer: 59 | Admitting: Physical Therapy
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
## 2021-07-12 ENCOUNTER — Ambulatory Visit: Payer: 59 | Admitting: Physical Therapy

## 2021-07-12 ENCOUNTER — Other Ambulatory Visit (HOSPITAL_COMMUNITY): Payer: Self-pay

## 2021-07-13 ENCOUNTER — Encounter: Payer: 59 | Admitting: Physical Therapy

## 2021-07-13 ENCOUNTER — Other Ambulatory Visit (HOSPITAL_COMMUNITY): Payer: Self-pay

## 2021-07-14 ENCOUNTER — Other Ambulatory Visit (HOSPITAL_COMMUNITY): Payer: Self-pay

## 2021-07-14 ENCOUNTER — Other Ambulatory Visit: Payer: Self-pay

## 2021-07-14 ENCOUNTER — Encounter: Payer: Self-pay | Admitting: Physical Therapy

## 2021-07-14 ENCOUNTER — Ambulatory Visit: Payer: 59 | Admitting: Physical Therapy

## 2021-07-14 DIAGNOSIS — M546 Pain in thoracic spine: Secondary | ICD-10-CM | POA: Diagnosis not present

## 2021-07-14 DIAGNOSIS — M25551 Pain in right hip: Secondary | ICD-10-CM

## 2021-07-14 DIAGNOSIS — M545 Low back pain, unspecified: Secondary | ICD-10-CM | POA: Diagnosis not present

## 2021-07-14 DIAGNOSIS — G8929 Other chronic pain: Secondary | ICD-10-CM

## 2021-07-14 DIAGNOSIS — M542 Cervicalgia: Secondary | ICD-10-CM

## 2021-07-14 DIAGNOSIS — M25511 Pain in right shoulder: Secondary | ICD-10-CM | POA: Diagnosis not present
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
## 2021-07-18 ENCOUNTER — Encounter: Payer: Self-pay | Admitting: Physical Therapy

## 2021-07-18 ENCOUNTER — Other Ambulatory Visit: Payer: Self-pay

## 2021-07-18 ENCOUNTER — Ambulatory Visit: Payer: 59 | Admitting: Physical Therapy

## 2021-07-18 DIAGNOSIS — M545 Low back pain, unspecified: Secondary | ICD-10-CM

## 2021-07-18 DIAGNOSIS — M546 Pain in thoracic spine: Secondary | ICD-10-CM | POA: Diagnosis not present

## 2021-07-18 DIAGNOSIS — M25551 Pain in right hip: Secondary | ICD-10-CM | POA: Diagnosis not present

## 2021-07-18 DIAGNOSIS — G8929 Other chronic pain: Secondary | ICD-10-CM

## 2021-07-18 DIAGNOSIS — M25511 Pain in right shoulder: Secondary | ICD-10-CM | POA: Diagnosis not present

## 2021-07-18 DIAGNOSIS — M542 Cervicalgia: Secondary | ICD-10-CM

## 2021-07-19 ENCOUNTER — Ambulatory Visit: Payer: 59 | Admitting: Physical Therapy

## 2021-07-21 ENCOUNTER — Other Ambulatory Visit (HOSPITAL_COMMUNITY): Payer: Self-pay

## 2021-07-22 ENCOUNTER — Encounter: Payer: Self-pay | Admitting: Internal Medicine

## 2021-07-22 NOTE — Assessment & Plan Note (Signed)
Brain fog better, but no recovery of smell, taste so far. ?

## 2021-07-22 NOTE — Assessment & Plan Note (Signed)
Xyzal and occasional Flonase currently sufficient ?

## 2021-07-22 NOTE — Assessment & Plan Note (Signed)
Moderate persistent uncomplicated ?Medications reviewed and extensive refills requested. ?
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
## 2021-07-27 ENCOUNTER — Other Ambulatory Visit: Payer: Self-pay

## 2021-07-27 ENCOUNTER — Ambulatory Visit: Payer: 59 | Attending: Internal Medicine | Admitting: Physical Therapy

## 2021-07-27 ENCOUNTER — Encounter: Payer: Self-pay | Admitting: Physical Therapy

## 2021-07-27 DIAGNOSIS — M545 Low back pain, unspecified: Secondary | ICD-10-CM | POA: Diagnosis not present

## 2021-07-27 DIAGNOSIS — G8929 Other chronic pain: Secondary | ICD-10-CM | POA: Insufficient documentation

## 2021-07-27 DIAGNOSIS — M25551 Pain in right hip: Secondary | ICD-10-CM | POA: Insufficient documentation

## 2021-07-27 DIAGNOSIS — M542 Cervicalgia: Secondary | ICD-10-CM | POA: Insufficient documentation

## 2021-07-27 DIAGNOSIS — M546 Pain in thoracic spine: Secondary | ICD-10-CM | POA: Insufficient documentation

## 2021-07-27 DIAGNOSIS — M25511 Pain in right shoulder: Secondary | ICD-10-CM | POA: Diagnosis present

## 2021-08-04 ENCOUNTER — Other Ambulatory Visit (HOSPITAL_COMMUNITY): Payer: Self-pay

## 2021-08-04 MED ORDER — DIAZEPAM 5 MG PO TABS
ORAL_TABLET | ORAL | 0 refills | Status: DC
  Filled 2021-08-04: qty 2, 1d supply, fill #0
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
## 2021-08-09 ENCOUNTER — Other Ambulatory Visit (HOSPITAL_COMMUNITY): Payer: Self-pay

## 2021-08-11 ENCOUNTER — Other Ambulatory Visit: Payer: Self-pay

## 2021-08-11 ENCOUNTER — Other Ambulatory Visit (HOSPITAL_COMMUNITY): Payer: Self-pay

## 2021-08-11 ENCOUNTER — Encounter: Payer: Self-pay | Admitting: Physical Therapy

## 2021-08-11 ENCOUNTER — Ambulatory Visit: Payer: 59 | Admitting: Physical Therapy

## 2021-08-11 DIAGNOSIS — M545 Low back pain, unspecified: Secondary | ICD-10-CM

## 2021-08-11 DIAGNOSIS — M546 Pain in thoracic spine: Secondary | ICD-10-CM

## 2021-08-11 DIAGNOSIS — G8929 Other chronic pain: Secondary | ICD-10-CM

## 2021-08-11 DIAGNOSIS — M542 Cervicalgia: Secondary | ICD-10-CM

## 2021-08-11 DIAGNOSIS — M25551 Pain in right hip: Secondary | ICD-10-CM

## 2021-08-11 DIAGNOSIS — M25511 Pain in right shoulder: Secondary | ICD-10-CM | POA: Diagnosis not present

## 2021-08-12 ENCOUNTER — Other Ambulatory Visit (HOSPITAL_COMMUNITY): Payer: Self-pay

## 2021-08-12 MED ORDER — ALPRAZOLAM 1 MG PO TABS
ORAL_TABLET | ORAL | 0 refills | Status: DC
  Filled 2021-08-12: qty 15, 10d supply, fill #0

## 2021-08-15 ENCOUNTER — Other Ambulatory Visit (HOSPITAL_COMMUNITY): Payer: Self-pay

## 2021-08-15 MED ORDER — OZEMPIC (1 MG/DOSE) 4 MG/3ML ~~LOC~~ SOPN
PEN_INJECTOR | SUBCUTANEOUS | 0 refills | Status: DC
  Filled 2021-08-15: qty 3, 28d supply, fill #0

## 2021-08-19 ENCOUNTER — Other Ambulatory Visit (HOSPITAL_COMMUNITY): Payer: Self-pay
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
## 2021-08-23 ENCOUNTER — Other Ambulatory Visit (HOSPITAL_COMMUNITY): Payer: Self-pay

## 2021-08-24 ENCOUNTER — Other Ambulatory Visit (HOSPITAL_COMMUNITY): Payer: Self-pay

## 2021-08-25 ENCOUNTER — Encounter: Payer: Self-pay | Admitting: Physical Therapy

## 2021-08-25 ENCOUNTER — Ambulatory Visit: Payer: 59 | Attending: Internal Medicine | Admitting: Physical Therapy

## 2021-08-25 ENCOUNTER — Other Ambulatory Visit: Payer: Self-pay

## 2021-08-25 DIAGNOSIS — M545 Low back pain, unspecified: Secondary | ICD-10-CM | POA: Diagnosis not present

## 2021-08-25 DIAGNOSIS — M25511 Pain in right shoulder: Secondary | ICD-10-CM | POA: Diagnosis present

## 2021-08-25 DIAGNOSIS — G8929 Other chronic pain: Secondary | ICD-10-CM | POA: Diagnosis not present

## 2021-08-25 DIAGNOSIS — M25551 Pain in right hip: Secondary | ICD-10-CM

## 2021-08-25 DIAGNOSIS — M542 Cervicalgia: Secondary | ICD-10-CM

## 2021-08-25 DIAGNOSIS — M546 Pain in thoracic spine: Secondary | ICD-10-CM | POA: Diagnosis present

## 2021-08-29 ENCOUNTER — Other Ambulatory Visit (HOSPITAL_COMMUNITY): Payer: Self-pay

## 2021-09-01 ENCOUNTER — Encounter: Payer: Self-pay | Admitting: Physical Therapy

## 2021-09-01 ENCOUNTER — Other Ambulatory Visit: Payer: Self-pay

## 2021-09-01 ENCOUNTER — Ambulatory Visit: Payer: 59 | Admitting: Physical Therapy

## 2021-09-01 DIAGNOSIS — M546 Pain in thoracic spine: Secondary | ICD-10-CM

## 2021-09-01 DIAGNOSIS — M25551 Pain in right hip: Secondary | ICD-10-CM | POA: Diagnosis not present

## 2021-09-01 DIAGNOSIS — M545 Low back pain, unspecified: Secondary | ICD-10-CM | POA: Diagnosis not present

## 2021-09-01 DIAGNOSIS — M542 Cervicalgia: Secondary | ICD-10-CM | POA: Diagnosis not present

## 2021-09-01 DIAGNOSIS — M25511 Pain in right shoulder: Secondary | ICD-10-CM | POA: Diagnosis not present

## 2021-09-01 DIAGNOSIS — G8929 Other chronic pain: Secondary | ICD-10-CM | POA: Diagnosis not present
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
## 2021-09-06 ENCOUNTER — Other Ambulatory Visit (HOSPITAL_COMMUNITY): Payer: Self-pay

## 2021-09-06 ENCOUNTER — Encounter: Payer: Self-pay | Admitting: Physical Therapy

## 2021-09-06 ENCOUNTER — Other Ambulatory Visit: Payer: Self-pay

## 2021-09-06 ENCOUNTER — Ambulatory Visit: Payer: 59 | Admitting: Physical Therapy

## 2021-09-06 DIAGNOSIS — M25511 Pain in right shoulder: Secondary | ICD-10-CM | POA: Diagnosis not present

## 2021-09-06 DIAGNOSIS — M542 Cervicalgia: Secondary | ICD-10-CM | POA: Diagnosis not present

## 2021-09-06 DIAGNOSIS — G8929 Other chronic pain: Secondary | ICD-10-CM

## 2021-09-06 DIAGNOSIS — M546 Pain in thoracic spine: Secondary | ICD-10-CM | POA: Diagnosis not present

## 2021-09-06 DIAGNOSIS — M25551 Pain in right hip: Secondary | ICD-10-CM

## 2021-09-06 DIAGNOSIS — M545 Low back pain, unspecified: Secondary | ICD-10-CM | POA: Diagnosis not present

## 2021-09-07 ENCOUNTER — Other Ambulatory Visit (HOSPITAL_COMMUNITY): Payer: Self-pay

## 2021-09-07 DIAGNOSIS — G932 Benign intracranial hypertension: Secondary | ICD-10-CM | POA: Diagnosis not present

## 2021-09-07 DIAGNOSIS — Z6841 Body Mass Index (BMI) 40.0 and over, adult: Secondary | ICD-10-CM | POA: Diagnosis not present

## 2021-09-07 DIAGNOSIS — R7303 Prediabetes: Secondary | ICD-10-CM | POA: Diagnosis not present

## 2021-09-07 MED ORDER — WEGOVY 1.7 MG/0.75ML ~~LOC~~ SOAJ
1.7000 mg | SUBCUTANEOUS | 0 refills | Status: DC
  Filled 2021-09-12: qty 3, 28d supply, fill #0

## 2021-09-07 MED ORDER — WEGOVY 1 MG/0.5ML ~~LOC~~ SOAJ
1.0000 mg | SUBCUTANEOUS | 0 refills | Status: DC
  Filled 2021-09-07: qty 2, 28d supply, fill #0

## 2021-09-11 ENCOUNTER — Other Ambulatory Visit (HOSPITAL_COMMUNITY): Payer: Self-pay

## 2021-09-12 ENCOUNTER — Other Ambulatory Visit (HOSPITAL_COMMUNITY): Payer: Self-pay

## 2021-09-12 ENCOUNTER — Other Ambulatory Visit: Payer: Self-pay

## 2021-09-26 ENCOUNTER — Other Ambulatory Visit (HOSPITAL_COMMUNITY): Payer: Self-pay

## 2021-09-27 ENCOUNTER — Other Ambulatory Visit (HOSPITAL_COMMUNITY): Payer: Self-pay
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
## 2021-09-28 ENCOUNTER — Other Ambulatory Visit (HOSPITAL_COMMUNITY): Payer: Self-pay

## 2021-09-29 ENCOUNTER — Other Ambulatory Visit: Payer: Self-pay

## 2021-09-29 ENCOUNTER — Ambulatory Visit: Payer: 59 | Attending: Internal Medicine | Admitting: Physical Therapy

## 2021-09-29 ENCOUNTER — Encounter: Payer: Self-pay | Admitting: Physical Therapy

## 2021-09-29 DIAGNOSIS — M546 Pain in thoracic spine: Secondary | ICD-10-CM | POA: Insufficient documentation

## 2021-09-29 DIAGNOSIS — M25511 Pain in right shoulder: Secondary | ICD-10-CM | POA: Insufficient documentation

## 2021-09-29 DIAGNOSIS — G8929 Other chronic pain: Secondary | ICD-10-CM | POA: Diagnosis not present

## 2021-09-29 DIAGNOSIS — M545 Low back pain, unspecified: Secondary | ICD-10-CM | POA: Diagnosis not present

## 2021-09-29 DIAGNOSIS — M25551 Pain in right hip: Secondary | ICD-10-CM | POA: Insufficient documentation

## 2021-09-29 DIAGNOSIS — M542 Cervicalgia: Secondary | ICD-10-CM | POA: Insufficient documentation

## 2021-10-02 DIAGNOSIS — Z01419 Encounter for gynecological examination (general) (routine) without abnormal findings: Secondary | ICD-10-CM | POA: Diagnosis not present

## 2021-10-02 DIAGNOSIS — Z6841 Body Mass Index (BMI) 40.0 and over, adult: Secondary | ICD-10-CM | POA: Diagnosis not present

## 2021-10-02 DIAGNOSIS — Z90711 Acquired absence of uterus with remaining cervical stump: Secondary | ICD-10-CM | POA: Diagnosis not present

## 2021-10-02 DIAGNOSIS — Z1211 Encounter for screening for malignant neoplasm of colon: Secondary | ICD-10-CM | POA: Diagnosis not present

## 2021-10-02 DIAGNOSIS — Z1231 Encounter for screening mammogram for malignant neoplasm of breast: Secondary | ICD-10-CM | POA: Diagnosis not present

## 2021-10-02 DIAGNOSIS — N761 Subacute and chronic vaginitis: Secondary | ICD-10-CM | POA: Diagnosis not present

## 2021-10-03 ENCOUNTER — Other Ambulatory Visit (HOSPITAL_COMMUNITY): Payer: Self-pay

## 2021-10-03 MED ORDER — TERCONAZOLE 0.4 % VA CREA
TOPICAL_CREAM | VAGINAL | 2 refills | Status: DC
  Filled 2021-10-03: qty 45, 7d supply, fill #0
  Filled 2021-12-17: qty 45, 7d supply, fill #1
  Filled 2022-09-30: qty 45, 7d supply, fill #2

## 2021-10-03 MED ORDER — METRONIDAZOLE 0.75 % VA GEL
VAGINAL | 2 refills | Status: DC
  Filled 2021-10-03: qty 70, 14d supply, fill #0
  Filled 2021-12-17: qty 70, 5d supply, fill #1
  Filled 2022-09-30: qty 70, 5d supply, fill #2

## 2021-10-03 MED ORDER — FLUCONAZOLE 150 MG PO TABS
ORAL_TABLET | ORAL | 2 refills | Status: DC
  Filled 2021-10-03: qty 1, 1d supply, fill #0

## 2021-10-04 ENCOUNTER — Other Ambulatory Visit (HOSPITAL_COMMUNITY): Payer: Self-pay

## 2021-10-04 MED ORDER — WEGOVY 2.4 MG/0.75ML ~~LOC~~ SOAJ
SUBCUTANEOUS | 0 refills | Status: DC
  Filled 2021-10-04: qty 3, 28d supply, fill #0

## 2021-10-06 ENCOUNTER — Other Ambulatory Visit (HOSPITAL_COMMUNITY): Payer: Self-pay

## 2021-10-08 DIAGNOSIS — M25511 Pain in right shoulder: Secondary | ICD-10-CM | POA: Diagnosis not present

## 2021-10-10 ENCOUNTER — Encounter: Payer: Self-pay | Admitting: Physical Therapy

## 2021-10-12 ENCOUNTER — Ambulatory Visit: Payer: 59 | Admitting: Physical Therapy

## 2021-10-12 DIAGNOSIS — M75101 Unspecified rotator cuff tear or rupture of right shoulder, not specified as traumatic: Secondary | ICD-10-CM | POA: Diagnosis not present

## 2021-10-17 ENCOUNTER — Other Ambulatory Visit: Payer: Self-pay | Admitting: Internal Medicine

## 2021-10-17 ENCOUNTER — Other Ambulatory Visit (HOSPITAL_COMMUNITY): Payer: Self-pay

## 2021-10-17 DIAGNOSIS — Z136 Encounter for screening for cardiovascular disorders: Secondary | ICD-10-CM | POA: Diagnosis not present

## 2021-10-17 DIAGNOSIS — E782 Mixed hyperlipidemia: Secondary | ICD-10-CM | POA: Diagnosis not present

## 2021-10-17 DIAGNOSIS — K219 Gastro-esophageal reflux disease without esophagitis: Secondary | ICD-10-CM | POA: Diagnosis not present

## 2021-10-17 DIAGNOSIS — R7303 Prediabetes: Secondary | ICD-10-CM | POA: Diagnosis not present

## 2021-10-17 DIAGNOSIS — Z0001 Encounter for general adult medical examination with abnormal findings: Secondary | ICD-10-CM | POA: Diagnosis not present

## 2021-10-17 DIAGNOSIS — I10 Essential (primary) hypertension: Secondary | ICD-10-CM | POA: Diagnosis not present

## 2021-10-17 DIAGNOSIS — Z1329 Encounter for screening for other suspected endocrine disorder: Secondary | ICD-10-CM | POA: Diagnosis not present

## 2021-10-17 DIAGNOSIS — J4550 Severe persistent asthma, uncomplicated: Secondary | ICD-10-CM | POA: Diagnosis not present

## 2021-10-17 DIAGNOSIS — Z131 Encounter for screening for diabetes mellitus: Secondary | ICD-10-CM | POA: Diagnosis not present

## 2021-10-17 MED ORDER — LOSARTAN POTASSIUM-HCTZ 50-12.5 MG PO TABS
ORAL_TABLET | ORAL | 1 refills | Status: DC
  Filled 2021-10-17 – 2021-12-17 (×2): qty 90, 90d supply, fill #0
  Filled 2022-04-19: qty 90, 90d supply, fill #1

## 2021-10-17 MED ORDER — PANTOPRAZOLE SODIUM 40 MG PO TBEC
DELAYED_RELEASE_TABLET | ORAL | 3 refills | Status: DC
  Filled 2021-10-17: qty 90, 90d supply, fill #0

## 2021-10-17 MED ORDER — BUDESONIDE-FORMOTEROL FUMARATE 160-4.5 MCG/ACT IN AERO
INHALATION_SPRAY | RESPIRATORY_TRACT | 9 refills | Status: DC
  Filled 2021-10-17: qty 10.2, 30d supply, fill #0

## 2021-10-17 MED ORDER — LEVALBUTEROL HCL 1.25 MG/0.5ML IN NEBU
INHALATION_SOLUTION | RESPIRATORY_TRACT | 6 refills | Status: DC
  Filled 2021-10-17: qty 30, 20d supply, fill #0

## 2021-10-17 MED ORDER — LEVALBUTEROL TARTRATE 45 MCG/ACT IN AERO
INHALATION_SPRAY | RESPIRATORY_TRACT | 9 refills | Status: DC
  Filled 2021-10-17: qty 15, 16d supply, fill #0

## 2021-10-17 MED ORDER — DILTIAZEM HCL ER COATED BEADS 300 MG PO CP24
ORAL_CAPSULE | ORAL | 1 refills | Status: DC
  Filled 2021-10-17: qty 90, 90d supply, fill #0

## 2021-10-18 ENCOUNTER — Other Ambulatory Visit (HOSPITAL_COMMUNITY): Payer: Self-pay

## 2021-10-28 ENCOUNTER — Other Ambulatory Visit (HOSPITAL_COMMUNITY): Payer: Self-pay

## 2021-10-31 ENCOUNTER — Ambulatory Visit: Payer: 59 | Admitting: Physical Therapy

## 2021-11-07 ENCOUNTER — Ambulatory Visit: Payer: 59 | Attending: Internal Medicine | Admitting: Physical Therapy

## 2021-11-07 ENCOUNTER — Encounter: Payer: Self-pay | Admitting: Physical Therapy

## 2021-11-07 ENCOUNTER — Other Ambulatory Visit: Payer: Self-pay

## 2021-11-07 DIAGNOSIS — M25551 Pain in right hip: Secondary | ICD-10-CM | POA: Insufficient documentation

## 2021-11-07 DIAGNOSIS — M545 Low back pain, unspecified: Secondary | ICD-10-CM | POA: Insufficient documentation

## 2021-11-07 DIAGNOSIS — M542 Cervicalgia: Secondary | ICD-10-CM | POA: Diagnosis not present

## 2021-11-07 DIAGNOSIS — M25511 Pain in right shoulder: Secondary | ICD-10-CM | POA: Insufficient documentation

## 2021-11-07 DIAGNOSIS — M546 Pain in thoracic spine: Secondary | ICD-10-CM | POA: Insufficient documentation

## 2021-11-07 DIAGNOSIS — G8929 Other chronic pain: Secondary | ICD-10-CM | POA: Insufficient documentation
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
## 2021-11-11 ENCOUNTER — Other Ambulatory Visit (INDEPENDENT_AMBULATORY_CARE_PROVIDER_SITE_OTHER): Payer: Self-pay | Admitting: Family Medicine

## 2021-11-11 ENCOUNTER — Other Ambulatory Visit (HOSPITAL_COMMUNITY): Payer: Self-pay

## 2021-11-13 ENCOUNTER — Other Ambulatory Visit (HOSPITAL_COMMUNITY): Payer: Self-pay

## 2021-11-13 DIAGNOSIS — S46111A Strain of muscle, fascia and tendon of long head of biceps, right arm, initial encounter: Secondary | ICD-10-CM | POA: Diagnosis not present

## 2021-11-13 DIAGNOSIS — S46011A Strain of muscle(s) and tendon(s) of the rotator cuff of right shoulder, initial encounter: Secondary | ICD-10-CM | POA: Diagnosis not present

## 2021-11-13 DIAGNOSIS — S43431A Superior glenoid labrum lesion of right shoulder, initial encounter: Secondary | ICD-10-CM | POA: Diagnosis not present

## 2021-11-13 DIAGNOSIS — G8918 Other acute postprocedural pain: Secondary | ICD-10-CM | POA: Diagnosis not present

## 2021-11-13 DIAGNOSIS — M24111 Other articular cartilage disorders, right shoulder: Secondary | ICD-10-CM | POA: Diagnosis not present

## 2021-11-13 MED ORDER — TRAMADOL HCL 50 MG PO TABS
ORAL_TABLET | ORAL | 0 refills | Status: DC
  Filled 2021-11-13: qty 40, 5d supply, fill #0

## 2021-11-13 MED ORDER — ONDANSETRON HCL 4 MG PO TABS
ORAL_TABLET | ORAL | 0 refills | Status: DC
  Filled 2021-11-13: qty 20, 5d supply, fill #0

## 2021-11-14 ENCOUNTER — Other Ambulatory Visit (HOSPITAL_COMMUNITY): Payer: Self-pay

## 2021-11-14 MED ORDER — HYDROCODONE-ACETAMINOPHEN 5-325 MG PO TABS
ORAL_TABLET | ORAL | 0 refills | Status: DC
  Filled 2021-11-14: qty 60, 5d supply, fill #0

## 2021-11-16 DIAGNOSIS — M25511 Pain in right shoulder: Secondary | ICD-10-CM | POA: Diagnosis not present

## 2021-11-16 DIAGNOSIS — M25611 Stiffness of right shoulder, not elsewhere classified: Secondary | ICD-10-CM | POA: Diagnosis not present

## 2021-11-21 DIAGNOSIS — M25611 Stiffness of right shoulder, not elsewhere classified: Secondary | ICD-10-CM | POA: Diagnosis not present

## 2021-11-21 DIAGNOSIS — M25511 Pain in right shoulder: Secondary | ICD-10-CM | POA: Diagnosis not present

## 2021-11-28 ENCOUNTER — Other Ambulatory Visit: Payer: Self-pay | Admitting: Internal Medicine

## 2021-11-28 ENCOUNTER — Other Ambulatory Visit (HOSPITAL_COMMUNITY): Payer: Self-pay

## 2021-11-28 MED ORDER — LORAZEPAM 1 MG PO TABS
1.0000 mg | ORAL_TABLET | Freq: Three times a day (TID) | ORAL | 5 refills | Status: DC | PRN
  Filled 2021-11-28: qty 30, 10d supply, fill #0
  Filled 2022-02-02: qty 30, 10d supply, fill #1

## 2021-11-28 NOTE — Telephone Encounter (Signed)
Lorazepam refilled

## 2021-11-29 ENCOUNTER — Encounter (INDEPENDENT_AMBULATORY_CARE_PROVIDER_SITE_OTHER): Payer: Self-pay

## 2021-11-29 DIAGNOSIS — M25611 Stiffness of right shoulder, not elsewhere classified: Secondary | ICD-10-CM | POA: Diagnosis not present

## 2021-11-29 DIAGNOSIS — M25511 Pain in right shoulder: Secondary | ICD-10-CM | POA: Diagnosis not present

## 2021-12-08 DIAGNOSIS — M25511 Pain in right shoulder: Secondary | ICD-10-CM | POA: Diagnosis not present

## 2021-12-08 DIAGNOSIS — M25611 Stiffness of right shoulder, not elsewhere classified: Secondary | ICD-10-CM | POA: Diagnosis not present

## 2021-12-11 DIAGNOSIS — Z0289 Encounter for other administrative examinations: Secondary | ICD-10-CM

## 2021-12-12 ENCOUNTER — Ambulatory Visit (INDEPENDENT_AMBULATORY_CARE_PROVIDER_SITE_OTHER): Payer: 59 | Admitting: Family Medicine

## 2021-12-12 ENCOUNTER — Other Ambulatory Visit (HOSPITAL_COMMUNITY): Payer: Self-pay

## 2021-12-12 ENCOUNTER — Encounter (INDEPENDENT_AMBULATORY_CARE_PROVIDER_SITE_OTHER): Payer: Self-pay | Admitting: Family Medicine

## 2021-12-12 VITALS — BP 146/88 | HR 81 | Temp 97.9°F | Ht 63.0 in | Wt 230.0 lb

## 2021-12-12 DIAGNOSIS — J4550 Severe persistent asthma, uncomplicated: Secondary | ICD-10-CM | POA: Diagnosis not present

## 2021-12-12 DIAGNOSIS — F32A Depression, unspecified: Secondary | ICD-10-CM | POA: Insufficient documentation

## 2021-12-12 DIAGNOSIS — E669 Obesity, unspecified: Secondary | ICD-10-CM | POA: Diagnosis not present

## 2021-12-12 DIAGNOSIS — R5383 Other fatigue: Secondary | ICD-10-CM | POA: Diagnosis not present

## 2021-12-12 DIAGNOSIS — R7303 Prediabetes: Secondary | ICD-10-CM | POA: Diagnosis not present

## 2021-12-12 DIAGNOSIS — R0602 Shortness of breath: Secondary | ICD-10-CM | POA: Diagnosis not present

## 2021-12-12 DIAGNOSIS — I10 Essential (primary) hypertension: Secondary | ICD-10-CM | POA: Diagnosis not present

## 2021-12-12 DIAGNOSIS — F3289 Other specified depressive episodes: Secondary | ICD-10-CM | POA: Diagnosis not present

## 2021-12-12 DIAGNOSIS — M25611 Stiffness of right shoulder, not elsewhere classified: Secondary | ICD-10-CM | POA: Diagnosis not present

## 2021-12-12 DIAGNOSIS — E66813 Obesity, class 3: Secondary | ICD-10-CM

## 2021-12-12 DIAGNOSIS — Z6841 Body Mass Index (BMI) 40.0 and over, adult: Secondary | ICD-10-CM

## 2021-12-12 DIAGNOSIS — M25511 Pain in right shoulder: Secondary | ICD-10-CM | POA: Diagnosis not present

## 2021-12-12 MED ORDER — WEGOVY 1.7 MG/0.75ML ~~LOC~~ SOAJ
1.7000 mg | SUBCUTANEOUS | 0 refills | Status: DC
  Filled 2021-12-12: qty 3, 28d supply, fill #0

## 2021-12-13 ENCOUNTER — Encounter (INDEPENDENT_AMBULATORY_CARE_PROVIDER_SITE_OTHER): Payer: Self-pay | Admitting: Family Medicine

## 2021-12-13 DIAGNOSIS — E538 Deficiency of other specified B group vitamins: Secondary | ICD-10-CM | POA: Insufficient documentation

## 2021-12-13 LAB — CBC WITH DIFFERENTIAL/PLATELET
Basophils Absolute: 0 10*3/uL (ref 0.0–0.2)
Basos: 1 %
EOS (ABSOLUTE): 0.4 10*3/uL (ref 0.0–0.4)
Eos: 7 %
Hematocrit: 37.7 % (ref 34.0–46.6)
Hemoglobin: 12.2 g/dL (ref 11.1–15.9)
Immature Grans (Abs): 0 10*3/uL (ref 0.0–0.1)
Immature Granulocytes: 0 %
Lymphocytes Absolute: 2.7 10*3/uL (ref 0.7–3.1)
Lymphs: 43 %
MCH: 26.5 pg — ABNORMAL LOW (ref 26.6–33.0)
MCHC: 32.4 g/dL (ref 31.5–35.7)
MCV: 82 fL (ref 79–97)
Monocytes Absolute: 0.4 10*3/uL (ref 0.1–0.9)
Monocytes: 6 %
Neutrophils Absolute: 2.7 10*3/uL (ref 1.4–7.0)
Neutrophils: 43 %
Platelets: 425 10*3/uL (ref 150–450)
RBC: 4.61 x10E6/uL (ref 3.77–5.28)
RDW: 13.9 % (ref 11.7–15.4)
WBC: 6.2 10*3/uL (ref 3.4–10.8)

## 2021-12-13 LAB — COMPREHENSIVE METABOLIC PANEL
ALT: 13 IU/L (ref 0–32)
AST: 16 IU/L (ref 0–40)
Albumin/Globulin Ratio: 1.8 (ref 1.2–2.2)
Albumin: 4.4 g/dL (ref 3.8–4.9)
Alkaline Phosphatase: 107 IU/L (ref 44–121)
BUN/Creatinine Ratio: 17 (ref 9–23)
BUN: 21 mg/dL (ref 6–24)
Bilirubin Total: 0.3 mg/dL (ref 0.0–1.2)
CO2: 25 mmol/L (ref 20–29)
Calcium: 9.6 mg/dL (ref 8.7–10.2)
Chloride: 102 mmol/L (ref 96–106)
Creatinine, Ser: 1.25 mg/dL — ABNORMAL HIGH (ref 0.57–1.00)
Globulin, Total: 2.4 g/dL (ref 1.5–4.5)
Glucose: 91 mg/dL (ref 70–99)
Potassium: 4.1 mmol/L (ref 3.5–5.2)
Sodium: 140 mmol/L (ref 134–144)
Total Protein: 6.8 g/dL (ref 6.0–8.5)
eGFR: 51 mL/min/{1.73_m2} — ABNORMAL LOW (ref >59)

## 2021-12-13 LAB — LIPID PANEL
Chol/HDL Ratio: 3.4 ratio (ref 0.0–4.4)
Cholesterol, Total: 193 mg/dL (ref 100–199)
HDL: 57 mg/dL (ref >39)
LDL Chol Calc (NIH): 122 mg/dL — ABNORMAL HIGH (ref 0–99)
Triglycerides: 75 mg/dL (ref 0–149)
VLDL Cholesterol Cal: 14 mg/dL (ref 5–40)

## 2021-12-13 LAB — HEMOGLOBIN A1C
Est. average glucose Bld gHb Est-mCnc: 123 mg/dL
Hgb A1c MFr Bld: 5.9 % — ABNORMAL HIGH (ref 4.8–5.6)

## 2021-12-13 LAB — INSULIN, RANDOM: INSULIN: 9.2 u[IU]/mL (ref 2.6–24.9)

## 2021-12-13 LAB — VITAMIN B12: Vitamin B-12: 72 pg/mL — ABNORMAL LOW (ref 232–1245)

## 2021-12-13 LAB — T4, FREE: Free T4: 1.32 ng/dL (ref 0.82–1.77)

## 2021-12-13 LAB — TSH: TSH: 1.42 u[IU]/mL (ref 0.450–4.500)

## 2021-12-13 LAB — VITAMIN D 25 HYDROXY (VIT D DEFICIENCY, FRACTURES): Vit D, 25-Hydroxy: 21.7 ng/mL — ABNORMAL LOW (ref 30.0–100.0)

## 2021-12-14 DIAGNOSIS — M25611 Stiffness of right shoulder, not elsewhere classified: Secondary | ICD-10-CM | POA: Diagnosis not present

## 2021-12-14 DIAGNOSIS — M25511 Pain in right shoulder: Secondary | ICD-10-CM | POA: Diagnosis not present

## 2021-12-17 ENCOUNTER — Other Ambulatory Visit (HOSPITAL_COMMUNITY): Payer: Self-pay

## 2021-12-17 ENCOUNTER — Other Ambulatory Visit: Payer: Self-pay | Admitting: Internal Medicine

## 2021-12-18 ENCOUNTER — Other Ambulatory Visit (HOSPITAL_COMMUNITY): Payer: Self-pay

## 2021-12-19 ENCOUNTER — Other Ambulatory Visit (HOSPITAL_COMMUNITY): Payer: Self-pay

## 2021-12-19 DIAGNOSIS — M25511 Pain in right shoulder: Secondary | ICD-10-CM | POA: Diagnosis not present

## 2021-12-19 DIAGNOSIS — M25611 Stiffness of right shoulder, not elsewhere classified: Secondary | ICD-10-CM | POA: Diagnosis not present

## 2021-12-19 MED ORDER — TRAMADOL HCL 50 MG PO TABS
100.0000 mg | ORAL_TABLET | Freq: Four times a day (QID) | ORAL | 0 refills | Status: DC | PRN
  Filled 2021-12-19: qty 40, 5d supply, fill #0

## 2021-12-19 NOTE — Progress Notes (Unsigned)
Chief Complaint:   OBESITY [name] (MR# [id number]) is a 56 y.o. female who presents for evaluation and treatment of obesity and related comorbidities. Current BMI is Body mass index is 40.74 kg/m. [name] has been struggling with her weight for many years and has been unsuccessful in either losing weight, maintaining weight loss, or reaching her healthy weight goal.  [name] is currently in the action stage of change and ready to dedicate time achieving and maintaining a healthier weight. [name] is interested in becoming our patient and working on intensive lifestyle modifications including (but not limited to) diet and exercise for weight loss.  [name]'s habits were reviewed today and are as follows: {MWM WT HABITS:23461}.  Depression Screen [name]'s Food and Mood (modified PHQ-9) score was ***.     12/12/2021    8:53 AM  Depression screen PHQ 2/9  Decreased Interest 1  Down, Depressed, Hopeless 1  PHQ - 2 Score 2  Altered sleeping 1  Tired, decreased energy 2  Change in appetite 2  Feeling bad or failure about yourself  1  Trouble concentrating 0  Moving slowly or fidgety/restless 0  Suicidal thoughts 0  PHQ-9 Score 8  Difficult doing work/chores Not difficult at all   Subjective:   1. Other fatigue [name] {Actions; denies/reports/admits to:19208} daytime somnolence and {Actions; denies/reports/admits to:19208} waking up still tired. Patient has a history of symptoms of {OSA Sx:17850}. [name] generally gets {numbers (fuzzy):14653} hours of sleep per night, and states that she has {sleep quality:17851}. Snoring {is/are not:32546} present. Apneic episodes {is/are not:32546} present. Epworth Sleepiness Score is ***.   2. SOBOE (shortness of breath on exertion) [name] notes increasing shortness of breath with exercising and seems to be worsening over time with weight gain. She notes getting out of breath sooner with activity than she used to. This has not gotten worse  recently. [name] denies shortness of breath at rest or orthopnea.  3. Essential hypertension ***  4. Allergic asthma, severe persistent, uncomplicated ***  5. Prediabetes ***  6. Other depression, with emotional eating ***  Assessment/Plan:   1. Other fatigue [name] does feel that her weight is causing her energy to be lower than it should be. Fatigue may be related to obesity, depression or many other causes. Labs will be ordered, and in the meanwhile, Aveline will focus on self care including making healthy food choices, increasing physical activity and focusing on stress reduction.  - EKG 12-Lead - Vitamin B12 - CBC with Differential/Platelet - Comprehensive metabolic panel - Hemoglobin A1c - Insulin, random - Lipid panel - T4, free - TSH - VITAMIN D 25 Hydroxy (Vit-D Deficiency, Fractures)  2. SOBOE (shortness of breath on exertion) [name] does feel that she gets out of breath more easily that she used to when she exercises. [name]'s shortness of breath appears to be obesity related and exercise induced. She has agreed to work on weight loss and gradually increase exercise to treat her exercise induced shortness of breath. Will continue to monitor closely.  3. Essential hypertension ***  4. Allergic asthma, severe persistent, uncomplicated ***  5. Prediabetes ***  6. Other depression, with emotional eating ***  7. Obesity, current BMI 40.8 [name] is currently in the action stage of change and her goal is to continue with weight loss efforts. I recommend [name] begin the structured treatment plan as follows:  She has agreed to the Category 3 Plan.  We discussed various medication options to help [name] with her  weight loss efforts and we both agreed to ***.  - Semaglutide-Weight Management (WEGOVY) 1.7 MG/0.75ML SOAJ; Inject 1.7 mg into the skin once a week.  Dispense: 3 mL; Refill: 0  Exercise goals: As is.    Behavioral modification strategies: increasing  lean protein intake, increasing vegetables, increasing water intake, no skipping meals, and decreasing junk food.  She was informed of the importance of frequent follow-up visits to maximize her success with intensive lifestyle modifications for her multiple health conditions. She was informed we would discuss her lab results at her next visit unless there is a critical issue that needs to be addressed sooner. [name] agreed to keep her next visit at the agreed upon time to discuss these results.  Objective:   Blood pressure (!) 146/88, pulse 81, temperature 97.9 F (36.6 C), height '5\' 3"'$  (1.6 m), weight 230 lb (104.3 kg), SpO2 97 %. Body mass index is 40.74 kg/m.  EKG: Normal sinus rhythm, rate 77 BPM.  Indirect Calorimeter completed today shows a VO2 of 307 and a REE of 2117.  Her calculated basal metabolic rate is 8182 thus her basal metabolic rate is better than expected.  General: Cooperative, alert, well developed, in no acute distress. HEENT: Conjunctivae and lids unremarkable. Cardiovascular: Regular rhythm.  Lungs: Normal work of breathing. Neurologic: No focal deficits.   Lab Results  Component Value Date   CREATININE 1.25 (H) 12/12/2021   BUN 21 12/12/2021   NA 140 12/12/2021   K 4.1 12/12/2021   CL 102 12/12/2021   CO2 25 12/12/2021   Lab Results  Component Value Date   ALT 13 12/12/2021   AST 16 12/12/2021   ALKPHOS 107 12/12/2021   BILITOT 0.3 12/12/2021   Lab Results  Component Value Date   HGBA1C 5.9 (H) 12/12/2021   Lab Results  Component Value Date   INSULIN 9.2 12/12/2021   Lab Results  Component Value Date   TSH 1.420 12/12/2021   Lab Results  Component Value Date   CHOL 193 12/12/2021   HDL 57 12/12/2021   LDLCALC 122 (H) 12/12/2021   TRIG 75 12/12/2021   CHOLHDL 3.4 12/12/2021   Lab Results  Component Value Date   WBC 6.2 12/12/2021   HGB 12.2 12/12/2021   HCT 37.7 12/12/2021   MCV 82 12/12/2021   PLT 425 12/12/2021   Lab Results   Component Value Date   FERRITIN 188 03/16/2019   Attestation Statements:   Reviewed by clinician on day of visit: allergies, medications, problem list, medical history, surgical history, family history, social history, and previous encounter notes.   Wilhemena Durie, am acting as transcriptionist for Loyal Gambler, DO.  I have reviewed the above documentation for accuracy and completeness, and I agree with the above. - ***

## 2021-12-21 DIAGNOSIS — M25611 Stiffness of right shoulder, not elsewhere classified: Secondary | ICD-10-CM | POA: Diagnosis not present

## 2021-12-21 DIAGNOSIS — M25511 Pain in right shoulder: Secondary | ICD-10-CM | POA: Diagnosis not present

## 2021-12-21 MED ORDER — HYDROCOD POLI-CHLORPHE POLI ER 10-8 MG/5ML PO SUER
ORAL | 0 refills | Status: DC
  Filled 2021-12-21: qty 115, 12d supply, fill #0

## 2021-12-21 NOTE — Telephone Encounter (Signed)
Please advise on med refill.  Allergies  Allergen Reactions   Influenza A (H1n1) Monoval Vac     Reaction: systemic reaction   Omalizumab     Arvid Right* REACTION: angioedema-looses airway   Promethazine Hcl     REACTION: hallucinations with too high of dose   Topiramate      Current Outpatient Medications:    albuterol (PROVENTIL) (2.5 MG/3ML) 0.083% nebulizer solution, USE 1 VIAL VIA NEBULIZER EVERY 6 HOURS AS NEEDED FOR WHEEZING OR SHORTNESS OF BREATH, Disp: 90 mL, Rfl: 12   albuterol (VENTOLIN HFA) 108 (90 Base) MCG/ACT inhaler, Inhale 2 puffs into the lungs every 6 hours as needed for wheezing, Disp: 18 g, Rfl: 12   ALPRAZolam (XANAX) 1 MG tablet, take 1/2 tablets by mouth every 8 hours as needed for anxiety and 30 mins before flight 10 days (Patient not taking: Reported on 12/12/2021), Disp: 15 tablet, Rfl: 0   amLODipine (NORVASC) 10 MG tablet, Take 10 mg by mouth daily., Disp: , Rfl:    Azelastine-Fluticasone (DYMISTA) 137-50 MCG/ACT SUSP, Use 1 spray in each nostril twice daily as directed, Disp: 23 g, Rfl: 5   benzonatate (TESSALON PERLES) 100 MG capsule, Take 1 capsule by mouth 3 times a day for 5 days (Patient not taking: Reported on 12/12/2021), Disp: 15 capsule, Rfl: 0   budesonide-formoterol (SYMBICORT) 160-4.5 MCG/ACT inhaler, Inhale 2 puff(s) into the lungs 2 times a day, Disp: 10.2 g, Rfl: 9   chlorpheniramine-HYDROcodone (TUSSIONEX PENNKINETIC ER) 10-8 MG/5ML, Take 5 mls by mouth every 12 hours if needed for cough (Patient not taking: Reported on 12/12/2021), Disp: 115 mL, Rfl: 0   cyclobenzaprine (FLEXERIL) 5 MG tablet, Take 1 tablet (5 mg total) by mouth 3 (three) times daily as needed., Disp: 30 tablet, Rfl: 1   EPINEPHrine 0.3 mg/0.3 mL IJ SOAJ injection, Inject into thigh as directed for severe allergic reaction/asthma., Disp: 2 each, Rfl: 12   esomeprazole (NEXIUM) 20 MG capsule, Take 1 capsule by mouth daily before a meal, Disp: 31 capsule, Rfl: 5   furosemide (LASIX) 40  MG tablet, TAKE 1 TABLET BY MOUTH ONCE DAILY AS NEEDED (Patient taking differently: Take 40 mg by mouth daily as needed for fluid.), Disp: 30 tablet, Rfl: 5   hydrochlorothiazide (HYDRODIURIL) 25 MG tablet, Take 1 tablet by mouth once a day for 90 days, Disp: 90 tablet, Rfl: 1   ibuprofen (ADVIL) 800 MG tablet, TAKE 1 TABLET BY MOUTH 3 TIMES DAILY AS NEEDED, Disp: 90 tablet, Rfl: 1   levalbuterol (XOPENEX HFA) 45 MCG/ACT inhaler, Inhale 2 puffs into the lungs every 4 hours, Disp: 15 g, Rfl: 9   levalbuterol (XOPENEX) 1.25 MG/0.5ML nebulizer solution, Inhale 1 vial (0.5 mLs) via nebulizer 3 times daily, Disp: 45 mL, Rfl: 6   levocetirizine (XYZAL) 5 MG tablet, Take 1 tablet (5 mg total) by mouth every evening., Disp: 30 tablet, Rfl: 5   LORazepam (ATIVAN) 1 MG tablet, Take 1 tablet (1 mg total) by mouth every 8 (eight) hours as needed for anxiety (Patient not taking: Reported on 12/12/2021), Disp: 30 tablet, Rfl: 5   losartan-hydrochlorothiazide (HYZAAR) 50-12.5 MG tablet, Take 1 tablet by mouth daily, Disp: 90 tablet, Rfl: 1   metroNIDAZOLE (METROGEL VAGINAL) 0.75 % vaginal gel, Insert 1 applicatorful in vagina every night for 5 nights as directed as needed., Disp: 70 g, Rfl: 2   montelukast (SINGULAIR) 10 MG tablet, Take 1 tablet (10 mg total) by mouth at bedtime., Disp: 30 tablet, Rfl: 10  olopatadine (PATANOL) 0.1 % ophthalmic solution, INSTILL 1 DROP INTO BOTH EYES TWO TIMES DAILY AS NEEDED FOR ALLERGIES (Patient not taking: Reported on 12/12/2021), Disp: 5 mL, Rfl: 5   Semaglutide-Weight Management (WEGOVY) 1.7 MG/0.75ML SOAJ, Inject 1.7 mg into the skin once a week., Disp: 3 mL, Rfl: 0   Spacer/Aero-Holding Chambers (AEROCHAMBER MV) inhaler, Use as instructed, Disp: 1 each, Rfl: 0   terconazole (TERAZOL 7) 0.4 % vaginal cream, Insert 1 applicatorful in vagina every night for 7 days as directed as needed (Patient not taking: Reported on 12/12/2021), Disp: 45 g, Rfl: 2   traMADol (ULTRAM) 50 MG  tablet, Take 2 tablets by mouth every 6 hours as needed., Disp: 40 tablet, Rfl: 0   TRANSDERM-SCOP, 1.5 MG, 1 MG/3DAYS, APPLY 1 PATCH ONTO THE SKIN EVERY 3 DAYS., Disp: 10 patch, Rfl: 12

## 2021-12-21 NOTE — Telephone Encounter (Signed)
Tussionex refilled 

## 2021-12-22 ENCOUNTER — Other Ambulatory Visit (HOSPITAL_COMMUNITY): Payer: Self-pay

## 2021-12-26 ENCOUNTER — Encounter (INDEPENDENT_AMBULATORY_CARE_PROVIDER_SITE_OTHER): Payer: Self-pay | Admitting: Family Medicine

## 2021-12-26 ENCOUNTER — Other Ambulatory Visit (HOSPITAL_COMMUNITY): Payer: Self-pay

## 2021-12-26 ENCOUNTER — Ambulatory Visit (INDEPENDENT_AMBULATORY_CARE_PROVIDER_SITE_OTHER): Payer: 59 | Admitting: Family Medicine

## 2021-12-26 VITALS — BP 167/97 | HR 85 | Temp 98.4°F | Ht 63.0 in | Wt 229.0 lb

## 2021-12-26 DIAGNOSIS — E559 Vitamin D deficiency, unspecified: Secondary | ICD-10-CM | POA: Diagnosis not present

## 2021-12-26 DIAGNOSIS — R7303 Prediabetes: Secondary | ICD-10-CM

## 2021-12-26 DIAGNOSIS — E669 Obesity, unspecified: Secondary | ICD-10-CM

## 2021-12-26 DIAGNOSIS — E538 Deficiency of other specified B group vitamins: Secondary | ICD-10-CM

## 2021-12-26 DIAGNOSIS — E66813 Obesity, class 3: Secondary | ICD-10-CM

## 2021-12-26 DIAGNOSIS — Z6841 Body Mass Index (BMI) 40.0 and over, adult: Secondary | ICD-10-CM

## 2021-12-26 MED ORDER — ONDANSETRON 4 MG PO TBDP
4.0000 mg | ORAL_TABLET | Freq: Three times a day (TID) | ORAL | 1 refills | Status: DC | PRN
  Filled 2021-12-26: qty 20, 7d supply, fill #0

## 2021-12-26 MED ORDER — VITAMIN D (ERGOCALCIFEROL) 1.25 MG (50000 UNIT) PO CAPS
50000.0000 [IU] | ORAL_CAPSULE | ORAL | 0 refills | Status: DC
  Filled 2021-12-26: qty 5, 35d supply, fill #0

## 2021-12-26 MED ORDER — WEGOVY 2.4 MG/0.75ML ~~LOC~~ SOAJ
2.4000 mg | SUBCUTANEOUS | 0 refills | Status: DC
  Filled 2021-12-26: qty 3, 28d supply, fill #0

## 2021-12-26 MED ORDER — VITAMIN B-12 1000 MCG PO TABS
1000.0000 ug | ORAL_TABLET | Freq: Every day | ORAL | 0 refills | Status: DC
  Filled 2021-12-26 (×2): qty 30, 30d supply, fill #0

## 2021-12-27 ENCOUNTER — Other Ambulatory Visit (HOSPITAL_COMMUNITY): Payer: Self-pay

## 2021-12-28 ENCOUNTER — Other Ambulatory Visit (HOSPITAL_COMMUNITY): Payer: Self-pay

## 2021-12-28 DIAGNOSIS — M25611 Stiffness of right shoulder, not elsewhere classified: Secondary | ICD-10-CM | POA: Diagnosis not present

## 2021-12-28 DIAGNOSIS — M25511 Pain in right shoulder: Secondary | ICD-10-CM | POA: Diagnosis not present

## 2021-12-29 ENCOUNTER — Other Ambulatory Visit (HOSPITAL_COMMUNITY): Payer: Self-pay

## 2022-01-02 DIAGNOSIS — M25611 Stiffness of right shoulder, not elsewhere classified: Secondary | ICD-10-CM | POA: Diagnosis not present

## 2022-01-02 DIAGNOSIS — M25511 Pain in right shoulder: Secondary | ICD-10-CM | POA: Diagnosis not present

## 2022-01-02 NOTE — Progress Notes (Signed)
Chief Complaint:   OBESITY [name] is here to discuss her progress with her obesity treatment plan along with follow-up of her obesity related diagnoses. [name] is on the Category 3 Plan and states she is following her eating plan approximately 67% of the time. [name] states she is not exercising.   Today's visit was #: 2 Starting weight: 230 lbs Starting date: 12/12/2021 Today's weight: 229 lbs Today's date: 12/26/2021 Total lbs lost to date: 1 lb Total lbs lost since last in-office visit: 1 lb  Interim History: working on getting in breakfast and not meal skipping.  Substitutes a protein shake some mornings. On Wegovy 1.7 mg with satiety, less nausea.  Trying to make time for snack at work.  Still in shoulder sling S/P rotator cuff surgery.    Subjective:   1. Vitamin D deficiency Discussed labs with patient today. Vitamin D level 21.7, complains of fatigue.    2. B12 deficiency New diagnosis.  Discussed labs with patient today. B12 level low at 72, complains of fatigue.  No history of B12 deficiency.   3. Prediabetes Discussed labs with patient today. A1c 5.9, has decreased intake of sugar.    Assessment/Plan:   1. Vitamin D deficiency Low Vitamin D level contributes to fatigue and are associated with obesity, breast, and colon cancer. She agrees to continue to take prescription Vitamin D '@50'$ ,000 IU every week and will follow-up for routine testing of Vitamin D, at least 2-3 times per year to avoid over-replacement. Recheck Vitamin D level in 3 months.   Begin - Vitamin D, Ergocalciferol, (DRISDOL) 1.25 MG (50000 UNIT) CAPS capsule; Take 1 capsule (50,000 Units total) by mouth every 7 (seven) days.  Dispense: 5 capsule; Refill: 0  2. B12 deficiency Recheck B12 in 2 months.  Begin - cyanocobalamin (VITAMIN B12) 1000 MCG tablet; Take 1 tablet (1,000 mcg total) by mouth daily.  Dispense: 30 tablet; Refill: 0  3. Prediabetes Continue plan for weight loss, increase  exercise and GLP-1 agonist.   4. Obesity, current BMI 40.6 Refill- ondansetron (ZOFRAN-ODT) 4 MG disintegrating tablet; Dissolve  1 tablet (4 mg total) by mouth every 8 (eight) hours as needed for nausea or vomiting.  Dispense: 20 tablet; Refill: 1  Increase - Semaglutide-Weight Management (WEGOVY) 2.4 MG/0.75ML SOAJ; Inject 2.4 mg into the skin once a week.  Dispense: 9 mL; Refill: 0 [name] is currently in the action stage of change. As such, her goal is to continue with weight loss efforts. She has agreed to the Category 3 Plan+ 90 grams protein daily.   Exercise goals:  As is.   Behavioral modification strategies: increasing lean protein intake, increasing vegetables, increasing water intake, decreasing eating out, no skipping meals, better snacking choices, and decreasing junk food.  [name] has agreed to follow-up with our clinic in 3 weeks. She was informed of the importance of frequent follow-up visits to maximize her success with intensive lifestyle modifications for her multiple health conditions.   Objective:   Blood pressure (!) 167/97, pulse 85, temperature 98.4 F (36.9 C), height '5\' 3"'$  (1.6 m), weight 229 lb (103.9 kg), SpO2 98 %. Body mass index is 40.57 kg/m.  General: Cooperative, alert, well developed, in no acute distress. HEENT: Conjunctivae and lids unremarkable. Cardiovascular: Regular rhythm.  Lungs: Normal work of breathing. Neurologic: No focal deficits.   Lab Results  Component Value Date   CREATININE 1.25 (H) 12/12/2021   BUN 21 12/12/2021   NA 140 12/12/2021   K  4.1 12/12/2021   CL 102 12/12/2021   CO2 25 12/12/2021   Lab Results  Component Value Date   ALT 13 12/12/2021   AST 16 12/12/2021   ALKPHOS 107 12/12/2021   BILITOT 0.3 12/12/2021   Lab Results  Component Value Date   HGBA1C 5.9 (H) 12/12/2021   Lab Results  Component Value Date   INSULIN 9.2 12/12/2021   Lab Results  Component Value Date   TSH 1.420 12/12/2021   Lab Results   Component Value Date   CHOL 193 12/12/2021   HDL 57 12/12/2021   LDLCALC 122 (H) 12/12/2021   TRIG 75 12/12/2021   CHOLHDL 3.4 12/12/2021   Lab Results  Component Value Date   VD25OH 21.7 (L) 12/12/2021   Lab Results  Component Value Date   WBC 6.2 12/12/2021   HGB 12.2 12/12/2021   HCT 37.7 12/12/2021   MCV 82 12/12/2021   PLT 425 12/12/2021   Lab Results  Component Value Date   FERRITIN 188 03/16/2019   Attestation Statements:   Reviewed by clinician on day of visit: allergies, medications, problem list, medical history, surgical history, family history, social history, and previous encounter notes.  I, Davy Pique, am acting as Location manager for Loyal Gambler, DO.  I have reviewed the above documentation for accuracy and completeness, and I agree with the above. Dell Ponto, DO

## 2022-01-05 ENCOUNTER — Ambulatory Visit (INDEPENDENT_AMBULATORY_CARE_PROVIDER_SITE_OTHER): Payer: 59 | Admitting: Pulmonary Disease

## 2022-01-05 ENCOUNTER — Other Ambulatory Visit: Payer: Self-pay | Admitting: Pulmonary Disease

## 2022-01-05 ENCOUNTER — Encounter: Payer: Self-pay | Admitting: Pulmonary Disease

## 2022-01-05 ENCOUNTER — Telehealth: Payer: Self-pay | Admitting: Internal Medicine

## 2022-01-05 ENCOUNTER — Other Ambulatory Visit (HOSPITAL_COMMUNITY): Payer: Self-pay

## 2022-01-05 VITALS — BP 170/90 | HR 110 | Temp 98.4°F | Ht 63.0 in | Wt 234.2 lb

## 2022-01-05 DIAGNOSIS — J4541 Moderate persistent asthma with (acute) exacerbation: Secondary | ICD-10-CM | POA: Diagnosis not present

## 2022-01-05 DIAGNOSIS — J4520 Mild intermittent asthma, uncomplicated: Secondary | ICD-10-CM

## 2022-01-05 MED ORDER — AZITHROMYCIN 250 MG PO TABS
ORAL_TABLET | ORAL | 0 refills | Status: DC
  Filled 2022-01-05: qty 6, fill #0
  Filled 2022-01-08: qty 6, 5d supply, fill #0

## 2022-01-05 MED ORDER — PREDNISONE 10 MG PO TABS
ORAL_TABLET | ORAL | 0 refills | Status: DC
  Filled 2022-01-05: qty 42, 12d supply, fill #0

## 2022-01-05 MED ORDER — METHYLPREDNISOLONE ACETATE 80 MG/ML IJ SUSP
120.0000 mg | Freq: Once | INTRAMUSCULAR | Status: AC
  Administered 2022-01-05: 120 mg via INTRAMUSCULAR
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
## 2022-01-05 NOTE — Telephone Encounter (Signed)
Spoke with pt who states she had an asthma flare last night and is requesting an OV. Dr. Vaughan Browner has agreed to she pt as an acute visit. Pt stated she would be here.   Routing to Dr. Vaughan Browner as Juluis Rainier

## 2022-01-05 NOTE — Patient Instructions (Signed)
We will give you steroid injection today Prednisone taper starting at 60 mg.  Reduce dose by 10 mg every 2 days Prescribe Z-Pak Continue Symbicort Follow-up with Dr. Annamaria Boots or nurse practitioner in 3 months.

## 2022-01-08 ENCOUNTER — Other Ambulatory Visit (HOSPITAL_COMMUNITY): Payer: Self-pay

## 2022-01-09 ENCOUNTER — Other Ambulatory Visit (HOSPITAL_COMMUNITY): Payer: Self-pay

## 2022-01-12 ENCOUNTER — Encounter: Payer: Self-pay | Admitting: Internal Medicine

## 2022-01-12 ENCOUNTER — Ambulatory Visit (INDEPENDENT_AMBULATORY_CARE_PROVIDER_SITE_OTHER): Payer: 59 | Admitting: Internal Medicine

## 2022-01-12 ENCOUNTER — Other Ambulatory Visit (HOSPITAL_COMMUNITY): Payer: Self-pay

## 2022-01-12 VITALS — BP 132/88 | HR 94 | Temp 98.1°F | Ht 63.0 in | Wt 239.4 lb

## 2022-01-12 DIAGNOSIS — J45901 Unspecified asthma with (acute) exacerbation: Secondary | ICD-10-CM

## 2022-01-12 MED ORDER — IPRATROPIUM-ALBUTEROL 0.5-2.5 (3) MG/3ML IN SOLN
RESPIRATORY_TRACT | 12 refills | Status: DC
  Filled 2022-01-12: qty 90, 5d supply, fill #0
  Filled 2022-04-19: qty 90, 5d supply, fill #1

## 2022-01-12 MED ORDER — METHYLPREDNISOLONE ACETATE 80 MG/ML IJ SUSP
80.0000 mg | Freq: Once | INTRAMUSCULAR | Status: AC
  Administered 2022-01-12: 80 mg via INTRAMUSCULAR

## 2022-01-12 MED ORDER — HYDROCOD POLI-CHLORPHE POLI ER 10-8 MG/5ML PO SUER
5.0000 mL | Freq: Two times a day (BID) | ORAL | 0 refills | Status: DC | PRN
  Filled 2022-01-12: qty 115, 12d supply, fill #0

## 2022-01-12 NOTE — Patient Instructions (Addendum)
Order- depo 80   dx asthma exacerbation  Refills sent for Tussionex and Duoneb  Hope you feel better soon  Keep Dec 18 appointment
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
## 2022-01-13 NOTE — Assessment & Plan Note (Signed)
Slow to resolve. Plan- Tussionex, Duoneb refill, depomedrol

## 2022-01-13 NOTE — Assessment & Plan Note (Signed)
Ongoing problem. She will need to decide how much she is prepared to do about this.

## 2022-01-15 ENCOUNTER — Other Ambulatory Visit (HOSPITAL_COMMUNITY): Payer: Self-pay

## 2022-01-16 DIAGNOSIS — M25611 Stiffness of right shoulder, not elsewhere classified: Secondary | ICD-10-CM | POA: Diagnosis not present

## 2022-01-16 DIAGNOSIS — M25511 Pain in right shoulder: Secondary | ICD-10-CM | POA: Diagnosis not present

## 2022-01-18 DIAGNOSIS — M25611 Stiffness of right shoulder, not elsewhere classified: Secondary | ICD-10-CM | POA: Diagnosis not present

## 2022-01-18 DIAGNOSIS — M25511 Pain in right shoulder: Secondary | ICD-10-CM | POA: Diagnosis not present

## 2022-01-22 ENCOUNTER — Telehealth (INDEPENDENT_AMBULATORY_CARE_PROVIDER_SITE_OTHER): Payer: 59 | Admitting: Family Medicine

## 2022-01-23 ENCOUNTER — Other Ambulatory Visit (HOSPITAL_COMMUNITY): Payer: Self-pay

## 2022-01-25 DIAGNOSIS — M25511 Pain in right shoulder: Secondary | ICD-10-CM | POA: Diagnosis not present

## 2022-01-25 DIAGNOSIS — M25611 Stiffness of right shoulder, not elsewhere classified: Secondary | ICD-10-CM | POA: Diagnosis not present

## 2022-01-29 ENCOUNTER — Ambulatory Visit (INDEPENDENT_AMBULATORY_CARE_PROVIDER_SITE_OTHER): Payer: 59 | Admitting: Family Medicine

## 2022-01-29 ENCOUNTER — Encounter (INDEPENDENT_AMBULATORY_CARE_PROVIDER_SITE_OTHER): Payer: Self-pay | Admitting: Family Medicine

## 2022-01-29 VITALS — BP 160/93 | HR 84 | Temp 98.1°F | Ht 63.0 in | Wt 229.0 lb

## 2022-01-29 DIAGNOSIS — J45909 Unspecified asthma, uncomplicated: Secondary | ICD-10-CM | POA: Diagnosis not present

## 2022-01-29 DIAGNOSIS — E559 Vitamin D deficiency, unspecified: Secondary | ICD-10-CM

## 2022-01-29 DIAGNOSIS — R7303 Prediabetes: Secondary | ICD-10-CM | POA: Diagnosis not present

## 2022-01-29 DIAGNOSIS — R430 Anosmia: Secondary | ICD-10-CM | POA: Diagnosis not present

## 2022-01-29 DIAGNOSIS — Z6841 Body Mass Index (BMI) 40.0 and over, adult: Secondary | ICD-10-CM | POA: Diagnosis not present

## 2022-01-29 DIAGNOSIS — E669 Obesity, unspecified: Secondary | ICD-10-CM

## 2022-01-29 DIAGNOSIS — E538 Deficiency of other specified B group vitamins: Secondary | ICD-10-CM | POA: Diagnosis not present

## 2022-01-29 DIAGNOSIS — E66813 Obesity, class 3: Secondary | ICD-10-CM

## 2022-01-29 MED ORDER — WEGOVY 2.4 MG/0.75ML ~~LOC~~ SOAJ
2.4000 mg | SUBCUTANEOUS | 0 refills | Status: DC
  Filled 2022-01-29: qty 3, 28d supply, fill #0

## 2022-01-29 MED ORDER — VITAMIN D (ERGOCALCIFEROL) 1.25 MG (50000 UNIT) PO CAPS
50000.0000 [IU] | ORAL_CAPSULE | ORAL | 0 refills | Status: DC
  Filled 2022-01-29: qty 5, 35d supply, fill #0

## 2022-01-30 ENCOUNTER — Other Ambulatory Visit (HOSPITAL_COMMUNITY): Payer: Self-pay

## 2022-01-30 DIAGNOSIS — M25511 Pain in right shoulder: Secondary | ICD-10-CM | POA: Diagnosis not present

## 2022-01-30 DIAGNOSIS — M25611 Stiffness of right shoulder, not elsewhere classified: Secondary | ICD-10-CM | POA: Diagnosis not present

## 2022-02-01 DIAGNOSIS — M25611 Stiffness of right shoulder, not elsewhere classified: Secondary | ICD-10-CM | POA: Diagnosis not present

## 2022-02-01 DIAGNOSIS — M25511 Pain in right shoulder: Secondary | ICD-10-CM | POA: Diagnosis not present

## 2022-02-02 ENCOUNTER — Other Ambulatory Visit (INDEPENDENT_AMBULATORY_CARE_PROVIDER_SITE_OTHER): Payer: Self-pay | Admitting: Family Medicine

## 2022-02-02 ENCOUNTER — Other Ambulatory Visit (HOSPITAL_COMMUNITY): Payer: Self-pay

## 2022-02-02 DIAGNOSIS — E538 Deficiency of other specified B group vitamins: Secondary | ICD-10-CM

## 2022-02-05 ENCOUNTER — Other Ambulatory Visit (HOSPITAL_COMMUNITY): Payer: Self-pay

## 2022-02-06 DIAGNOSIS — M25611 Stiffness of right shoulder, not elsewhere classified: Secondary | ICD-10-CM | POA: Diagnosis not present

## 2022-02-06 DIAGNOSIS — M25511 Pain in right shoulder: Secondary | ICD-10-CM | POA: Diagnosis not present

## 2022-02-07 NOTE — Progress Notes (Unsigned)
Chief Complaint:   OBESITY [name] is here to discuss her progress with her obesity treatment plan along with follow-up of her obesity related diagnoses. [name] is on Category 3 Plan+ 90 grams protein daily and states she is following her eating plan approximately 50% of the time. [name] states she has not been exercising.  Today's visit was #: 3 Starting weight: 230 lbs Starting date: 12/12/2021 Today's weight: 229 lbs Today's date: 01/29/22 Total lbs lost to date: -1 Total lbs lost since last in-office visit: 0  Interim History: Has had an asthma exacerbation since her last visit-required oral and IM steroid.  Drank mostly ginger ale but did not eat much.  Increased Wegovy to 2.4 mg subcu weekly.  Has seen improvements in appetite.  Slight nausea, on Zofran as needed.  Started physical therapy following right rotator cuff surgery.  Subjective:   1. Anosmia Post COVID in 2020. Sense of smell and taste has not improved since November 2020. Able to eat all foods.  2. Vitamin D deficiency On prescription vitamin D 50,000 IU weekly. Last vitamin D level 21.7 on 12/12/2021.  3. Vitamin B12 deficiency Started vitamin B12 1000 mcg daily. Last B12 level low at 72. Still has fatigue.  4. Pre-diabetes A1c 5.9 on 12/12/2021. Did increase intake of soda while sick with asthma exacerbation.  5. Asthma, unspecified asthma severity, unspecified whether complicated, unspecified whether persistent Seeing Dr. Rogelia Boga on current regimen. Recent flareup triggered by allergens, required steroids and antibiotics.  Assessment/Plan:   1. Anosmia Continue to be mindful of food quantities, sugar and salt intake that can be worsened by anosmia.  2. Vitamin D deficiency Refilled: - Vitamin D, Ergocalciferol, (DRISDOL) 1.25 MG (50000 UNIT) CAPS capsule; Take 1 capsule by mouth every 7 days.  Dispense: 5 capsule; Refill: 0 Recheck level in 6 weeks.  3. Vitamin B12  deficiency Continue B12 1000 mcg daily. Recheck B12 level in 6 weeks.  4. Pre-diabetes Reduce intake of all SSBs and look for A1c improvements in the next 2 mos.  5. Asthma, unspecified asthma severity, unspecified whether complicated, unspecified whether persistent Continue treatment plan per Dr. Annamaria Boots. Reintroduce exercise as breathing improves.  6. Obesity, current BMI 40.7 1.  Resume category 3 meal plan. 2.  Refilled: - Semaglutide-Weight Management (WEGOVY) 2.4 MG/0.75ML SOAJ; Inject 2.4 mg into the skin once a week.  Dispense: 3 mL; Refill: 0  [name] is currently in the action stage of change. As such, her goal is to continue with weight loss efforts. She has agreed to Category 3 Plan+ 90 grams protein daily.   Exercise goals: As tolerated-in right shoulder sling.  Behavioral modification strategies: increasing lean protein intake, increasing vegetables, increasing water intake, decreasing eating out, no skipping meals, meal planning and cooking strategies, keeping healthy foods in the home, better snacking choices, and decreasing junk food.  [name] has agreed to follow-up with our clinic in 3-4 weeks. She was informed of the importance of frequent follow-up visits to maximize her success with intensive lifestyle modifications for her multiple health conditions.    Objective:   Blood pressure (!) 160/93, pulse 84, temperature 98.1 F (36.7 C), height '5\' 3"'$  (1.6 m), weight 229 lb (103.9 kg), SpO2 99 %. Body mass index is 40.57 kg/m.  General: Cooperative, alert, well developed, in no acute distress. HEENT: Conjunctivae and lids unremarkable. Cardiovascular: Regular rhythm.  Lungs: Normal work of breathing. Neurologic: No focal deficits.   Lab Results  Component Value Date  CREATININE 1.25 (H) 12/12/2021   BUN 21 12/12/2021   NA 140 12/12/2021   K 4.1 12/12/2021   CL 102 12/12/2021   CO2 25 12/12/2021   Lab Results  Component Value Date   ALT 13 12/12/2021    AST 16 12/12/2021   ALKPHOS 107 12/12/2021   BILITOT 0.3 12/12/2021   Lab Results  Component Value Date   HGBA1C 5.9 (H) 12/12/2021   Lab Results  Component Value Date   INSULIN 9.2 12/12/2021   Lab Results  Component Value Date   TSH 1.420 12/12/2021   Lab Results  Component Value Date   CHOL 193 12/12/2021   HDL 57 12/12/2021   LDLCALC 122 (H) 12/12/2021   TRIG 75 12/12/2021   CHOLHDL 3.4 12/12/2021   Lab Results  Component Value Date   VD25OH 21.7 (L) 12/12/2021   Lab Results  Component Value Date   WBC 6.2 12/12/2021   HGB 12.2 12/12/2021   HCT 37.7 12/12/2021   MCV 82 12/12/2021   PLT 425 12/12/2021   Lab Results  Component Value Date   FERRITIN 188 03/16/2019   Attestation Statements:   Reviewed by clinician on day of visit: allergies, medications, problem list, medical history, surgical history, family history, social history, and previous encounter notes.  I, Georgianne Fick, FNP, am acting as transcriptionist for Dr. Loyal Gambler.  I have reviewed the above documentation for accuracy and completeness, and I agree with the above. Dell Ponto, DO

## 2022-02-08 DIAGNOSIS — M25511 Pain in right shoulder: Secondary | ICD-10-CM | POA: Diagnosis not present

## 2022-02-08 DIAGNOSIS — M25611 Stiffness of right shoulder, not elsewhere classified: Secondary | ICD-10-CM | POA: Diagnosis not present

## 2022-02-09 ENCOUNTER — Other Ambulatory Visit (HOSPITAL_COMMUNITY): Payer: Self-pay

## 2022-02-13 DIAGNOSIS — M25511 Pain in right shoulder: Secondary | ICD-10-CM | POA: Diagnosis not present

## 2022-02-13 DIAGNOSIS — M25611 Stiffness of right shoulder, not elsewhere classified: Secondary | ICD-10-CM | POA: Diagnosis not present

## 2022-02-19 ENCOUNTER — Other Ambulatory Visit (HOSPITAL_COMMUNITY): Payer: Self-pay

## 2022-02-20 DIAGNOSIS — M25511 Pain in right shoulder: Secondary | ICD-10-CM | POA: Diagnosis not present

## 2022-02-20 DIAGNOSIS — M25611 Stiffness of right shoulder, not elsewhere classified: Secondary | ICD-10-CM | POA: Diagnosis not present

## 2022-02-22 ENCOUNTER — Other Ambulatory Visit (HOSPITAL_COMMUNITY): Payer: Self-pay

## 2022-02-27 ENCOUNTER — Ambulatory Visit (INDEPENDENT_AMBULATORY_CARE_PROVIDER_SITE_OTHER): Payer: 59 | Admitting: Family Medicine

## 2022-02-27 ENCOUNTER — Other Ambulatory Visit (HOSPITAL_COMMUNITY): Payer: Self-pay

## 2022-02-27 ENCOUNTER — Encounter (INDEPENDENT_AMBULATORY_CARE_PROVIDER_SITE_OTHER): Payer: Self-pay | Admitting: Family Medicine

## 2022-02-27 VITALS — HR 72 | Temp 98.1°F | Ht 63.0 in | Wt 228.0 lb

## 2022-02-27 DIAGNOSIS — E559 Vitamin D deficiency, unspecified: Secondary | ICD-10-CM

## 2022-02-27 DIAGNOSIS — E669 Obesity, unspecified: Secondary | ICD-10-CM

## 2022-02-27 DIAGNOSIS — E538 Deficiency of other specified B group vitamins: Secondary | ICD-10-CM | POA: Diagnosis not present

## 2022-02-27 DIAGNOSIS — M75101 Unspecified rotator cuff tear or rupture of right shoulder, not specified as traumatic: Secondary | ICD-10-CM | POA: Insufficient documentation

## 2022-02-27 DIAGNOSIS — M25511 Pain in right shoulder: Secondary | ICD-10-CM | POA: Diagnosis not present

## 2022-02-27 DIAGNOSIS — I10 Essential (primary) hypertension: Secondary | ICD-10-CM

## 2022-02-27 DIAGNOSIS — M25611 Stiffness of right shoulder, not elsewhere classified: Secondary | ICD-10-CM | POA: Diagnosis not present

## 2022-02-27 DIAGNOSIS — E66813 Obesity, class 3: Secondary | ICD-10-CM

## 2022-02-27 DIAGNOSIS — Z6841 Body Mass Index (BMI) 40.0 and over, adult: Secondary | ICD-10-CM

## 2022-02-27 DIAGNOSIS — R7303 Prediabetes: Secondary | ICD-10-CM

## 2022-02-27 MED ORDER — WEGOVY 1.7 MG/0.75ML ~~LOC~~ SOAJ
1.7000 mg | SUBCUTANEOUS | 0 refills | Status: DC
  Filled 2022-02-27: qty 3, 28d supply, fill #0

## 2022-02-27 MED ORDER — VITAMIN D (ERGOCALCIFEROL) 1.25 MG (50000 UNIT) PO CAPS
50000.0000 [IU] | ORAL_CAPSULE | ORAL | 0 refills | Status: DC
  Filled 2022-02-27: qty 5, 35d supply, fill #0

## 2022-02-27 MED ORDER — VITAMIN B-12 1000 MCG PO TABS
1000.0000 ug | ORAL_TABLET | Freq: Every day | ORAL | 0 refills | Status: DC
  Filled 2022-02-27: qty 30, 30d supply, fill #0

## 2022-02-27 MED ORDER — METFORMIN HCL ER 500 MG PO TB24
500.0000 mg | ORAL_TABLET | Freq: Every day | ORAL | 0 refills | Status: DC
  Filled 2022-02-27: qty 30, 30d supply, fill #0

## 2022-02-27 MED ORDER — ONDANSETRON 4 MG PO TBDP
4.0000 mg | ORAL_TABLET | Freq: Three times a day (TID) | ORAL | 1 refills | Status: DC | PRN
  Filled 2022-02-27: qty 20, 7d supply, fill #0

## 2022-03-01 DIAGNOSIS — M1711 Unilateral primary osteoarthritis, right knee: Secondary | ICD-10-CM | POA: Diagnosis not present

## 2022-03-06 DIAGNOSIS — M25511 Pain in right shoulder: Secondary | ICD-10-CM | POA: Diagnosis not present

## 2022-03-06 DIAGNOSIS — M25611 Stiffness of right shoulder, not elsewhere classified: Secondary | ICD-10-CM | POA: Diagnosis not present

## 2022-03-08 DIAGNOSIS — Z4789 Encounter for other orthopedic aftercare: Secondary | ICD-10-CM | POA: Diagnosis not present

## 2022-03-08 DIAGNOSIS — M25511 Pain in right shoulder: Secondary | ICD-10-CM | POA: Diagnosis not present

## 2022-03-08 DIAGNOSIS — M1711 Unilateral primary osteoarthritis, right knee: Secondary | ICD-10-CM | POA: Diagnosis not present

## 2022-03-08 DIAGNOSIS — M25611 Stiffness of right shoulder, not elsewhere classified: Secondary | ICD-10-CM | POA: Diagnosis not present

## 2022-03-08 NOTE — Progress Notes (Signed)
Chief Complaint:   OBESITY [name] is here to discuss her progress with her obesity treatment plan along with follow-up of her obesity related diagnoses. [name] is on the Category 3 Plan+90 protein and states she is following her eating plan approximately 60-70% of the time. [name] states she is not exercising.   Today's visit was #: 4 Starting weight: 230 lbs Starting date: 12/12/2021 Today's weight: 228 lbs Today's date: 02/27/2022 Total lbs lost to date: 2 lbs Total lbs lost since last in-office visit: 1 lb  Interim History: She has been craving for more candy.  She is on Wegovy 2.4 mg weekly  and not wanting much for dinner.  Snacking on candy and cookies.  Sometimes skips breakfast.  Has not been eating the bread on her meal plan.  Her mother recently completed breast cancer treatment.  In PT for right rotator cuff surgery.    Subjective:   1. Pre-diabetes Last A1c 5.9, consuming more sweets.  Had prednisone last month for asthma excerbation.    2. Vitamin D deficiency She is currently taking prescription vitamin D 50,000 IU each week. She denies nausea, vomiting or muscle weakness. Energy level improving.   3. B12 deficiency She is on B12, 1,000 mcg daily.  Last level 2 months agol, 72.   4. Tear of right rotator cuff, unspecified tear extent, unspecified whether traumatic She is healing from right rotator cuff surgery  and is currently in PT.  She has follow up scheduled with ortho.   5. Essential hypertension Blood pressures at work are 130's/80's.  She is taking amlodipine, HCTZ, and Hyzaar daily.   Assessment/Plan:   1. Pre-diabetes Cut out sweets, recheck A1c next visit.   Begin - metFORMIN (GLUCOPHAGE-XR) 500 MG 24 hr tablet; Take 1 tablet (500 mg total) by mouth daily with supper.  Dispense: 30 tablet; Refill: 0  2. Vitamin D deficiency Recheck Vitamin D level in 3-4 weeks.   Refill - Vitamin D, Ergocalciferol, (DRISDOL) 1.25 MG (50000 UNIT) CAPS capsule;  Take 1 capsule by mouth every 7 days.  Dispense: 5 capsule; Refill: 0  3. B12 deficiency Refill - cyanocobalamin (VITAMIN B12) 1000 MCG tablet; Take 1 tablet (1,000 mcg total) by mouth daily.  Dispense: 30 tablet; Refill: 0  4. Tear of right rotator cuff, unspecified tear extent, unspecified whether traumatic Plans to get back on [employer] bike and increase walking.   5. Essential hypertension Continue current medications and monitoring blood pressures at work.   6. Obesity, current BMI 40.5 1) Reduce Wegovy to 1.7 mg weekly to improve focus on meals.  2) Swap out candy for greek yogurt, string cheese, almonds.  3) Refilled Zofran 4 mg ODT to use prn for nausea.   Refill- ondansetron (ZOFRAN-ODT) 4 MG disintegrating tablet; Dissolve  1 tablet (4 mg total) by mouth every 8 (eight) hours as needed for nausea or vomiting.  Dispense: 20 tablet; Refill: 1  Refill - Semaglutide-Weight Management (WEGOVY) 1.7 MG/0.75ML SOAJ; Inject 1.7 mg into the skin once a week.  Dispense: 3 mL; Refill: 0  [name] is currently in the action stage of change. As such, her goal is to continue with weight loss efforts. She has agreed to the Category 3 Plan+90 protein daily.     Exercise goals:  Add in more regular walking.   Behavioral modification strategies: increasing lean protein intake, increasing vegetables, increasing water intake, no skipping meals, better snacking choices, avoiding temptations, planning for success, and decreasing junk food.  [name]  has agreed to follow-up with our clinic in 3-4 weeks. She was informed of the importance of frequent follow-up visits to maximize her success with intensive lifestyle modifications for her multiple health conditions.   Objective:   Pulse 72, temperature 98.1 F (36.7 C), height '5\' 3"'$  (1.6 m), weight 228 lb (103.4 kg), SpO2 97 %. Body mass index is 40.39 kg/m.  General: Cooperative, alert, well developed, in no acute distress. HEENT: Conjunctivae and lids  unremarkable. Cardiovascular: Regular rhythm.  Lungs: Normal work of breathing. Neurologic: No focal deficits.   Lab Results  Component Value Date   CREATININE 1.25 (H) 12/12/2021   BUN 21 12/12/2021   NA 140 12/12/2021   K 4.1 12/12/2021   CL 102 12/12/2021   CO2 25 12/12/2021   Lab Results  Component Value Date   ALT 13 12/12/2021   AST 16 12/12/2021   ALKPHOS 107 12/12/2021   BILITOT 0.3 12/12/2021   Lab Results  Component Value Date   HGBA1C 5.9 (H) 12/12/2021   Lab Results  Component Value Date   INSULIN 9.2 12/12/2021   Lab Results  Component Value Date   TSH 1.420 12/12/2021   Lab Results  Component Value Date   CHOL 193 12/12/2021   HDL 57 12/12/2021   LDLCALC 122 (H) 12/12/2021   TRIG 75 12/12/2021   CHOLHDL 3.4 12/12/2021   Lab Results  Component Value Date   VD25OH 21.7 (L) 12/12/2021   Lab Results  Component Value Date   WBC 6.2 12/12/2021   HGB 12.2 12/12/2021   HCT 37.7 12/12/2021   MCV 82 12/12/2021   PLT 425 12/12/2021   Lab Results  Component Value Date   FERRITIN 188 03/16/2019   Attestation Statements:   Reviewed by clinician on day of visit: allergies, medications, problem list, medical history, surgical history, family history, social history, and previous encounter notes.  I have personally spent 40 minutes total time today in preparation, patient care, and documentation for this visit, including the following: review of clinical lab tests; review of medical tests/procedures/services.    I, Davy Pique, am acting as Location manager for Loyal Gambler, DO.  I have reviewed the above documentation for accuracy and completeness, and I agree with the above. Dell Ponto, DO

## 2022-03-12 ENCOUNTER — Other Ambulatory Visit (HOSPITAL_COMMUNITY): Payer: Self-pay

## 2022-03-12 DIAGNOSIS — M25611 Stiffness of right shoulder, not elsewhere classified: Secondary | ICD-10-CM | POA: Diagnosis not present

## 2022-03-12 DIAGNOSIS — M25511 Pain in right shoulder: Secondary | ICD-10-CM | POA: Diagnosis not present

## 2022-03-14 DIAGNOSIS — M1711 Unilateral primary osteoarthritis, right knee: Secondary | ICD-10-CM | POA: Diagnosis not present

## 2022-03-27 ENCOUNTER — Ambulatory Visit (INDEPENDENT_AMBULATORY_CARE_PROVIDER_SITE_OTHER): Payer: 59 | Admitting: Family Medicine

## 2022-03-30 DIAGNOSIS — M25511 Pain in right shoulder: Secondary | ICD-10-CM | POA: Diagnosis not present

## 2022-03-30 DIAGNOSIS — M25611 Stiffness of right shoulder, not elsewhere classified: Secondary | ICD-10-CM | POA: Diagnosis not present

## 2022-04-03 ENCOUNTER — Ambulatory Visit (INDEPENDENT_AMBULATORY_CARE_PROVIDER_SITE_OTHER): Payer: 59 | Admitting: Family Medicine

## 2022-04-03 ENCOUNTER — Encounter (INDEPENDENT_AMBULATORY_CARE_PROVIDER_SITE_OTHER): Payer: Self-pay | Admitting: Family Medicine

## 2022-04-03 ENCOUNTER — Other Ambulatory Visit (HOSPITAL_COMMUNITY): Payer: Self-pay

## 2022-04-03 VITALS — BP 157/92 | HR 65 | Temp 98.4°F | Ht 63.0 in | Wt 232.0 lb

## 2022-04-03 DIAGNOSIS — R7303 Prediabetes: Secondary | ICD-10-CM | POA: Diagnosis not present

## 2022-04-03 DIAGNOSIS — E559 Vitamin D deficiency, unspecified: Secondary | ICD-10-CM

## 2022-04-03 DIAGNOSIS — M25611 Stiffness of right shoulder, not elsewhere classified: Secondary | ICD-10-CM | POA: Diagnosis not present

## 2022-04-03 DIAGNOSIS — Z6841 Body Mass Index (BMI) 40.0 and over, adult: Secondary | ICD-10-CM | POA: Diagnosis not present

## 2022-04-03 DIAGNOSIS — R632 Polyphagia: Secondary | ICD-10-CM

## 2022-04-03 DIAGNOSIS — E669 Obesity, unspecified: Secondary | ICD-10-CM | POA: Diagnosis not present

## 2022-04-03 DIAGNOSIS — M25511 Pain in right shoulder: Secondary | ICD-10-CM | POA: Diagnosis not present

## 2022-04-03 DIAGNOSIS — I10 Essential (primary) hypertension: Secondary | ICD-10-CM

## 2022-04-03 DIAGNOSIS — E538 Deficiency of other specified B group vitamins: Secondary | ICD-10-CM

## 2022-04-03 DIAGNOSIS — E66813 Obesity, class 3: Secondary | ICD-10-CM

## 2022-04-03 MED ORDER — VITAMIN B-12 1000 MCG PO TABS
1000.0000 ug | ORAL_TABLET | Freq: Every day | ORAL | 0 refills | Status: DC
  Filled 2022-04-03: qty 30, 30d supply, fill #0

## 2022-04-03 MED ORDER — ONDANSETRON 4 MG PO TBDP
4.0000 mg | ORAL_TABLET | Freq: Three times a day (TID) | ORAL | 1 refills | Status: DC | PRN
  Filled 2022-04-03: qty 20, 7d supply, fill #0
  Filled 2022-04-19: qty 20, 7d supply, fill #1

## 2022-04-03 MED ORDER — WEGOVY 1.7 MG/0.75ML ~~LOC~~ SOAJ
1.7000 mg | SUBCUTANEOUS | 0 refills | Status: DC
  Filled 2022-04-03: qty 3, 28d supply, fill #0

## 2022-04-03 MED ORDER — VITAMIN D (ERGOCALCIFEROL) 1.25 MG (50000 UNIT) PO CAPS
50000.0000 [IU] | ORAL_CAPSULE | ORAL | 0 refills | Status: DC
  Filled 2022-04-03: qty 5, 35d supply, fill #0

## 2022-04-04 ENCOUNTER — Other Ambulatory Visit (HOSPITAL_COMMUNITY): Payer: Self-pay

## 2022-04-07 LAB — HEMOGLOBIN A1C
Est. average glucose Bld gHb Est-mCnc: 123 mg/dL
Hgb A1c MFr Bld: 5.9 % — ABNORMAL HIGH (ref 4.8–5.6)

## 2022-04-07 LAB — COMPREHENSIVE METABOLIC PANEL
ALT: 18 IU/L (ref 0–32)
AST: 20 IU/L (ref 0–40)
Albumin/Globulin Ratio: 2.1 (ref 1.2–2.2)
Albumin: 4.5 g/dL (ref 3.8–4.9)
Alkaline Phosphatase: 96 IU/L (ref 44–121)
BUN/Creatinine Ratio: 18 (ref 9–23)
BUN: 24 mg/dL (ref 6–24)
Bilirubin Total: 0.2 mg/dL (ref 0.0–1.2)
CO2: 29 mmol/L (ref 20–29)
Calcium: 10 mg/dL (ref 8.7–10.2)
Chloride: 102 mmol/L (ref 96–106)
Creatinine, Ser: 1.31 mg/dL — ABNORMAL HIGH (ref 0.57–1.00)
Globulin, Total: 2.1 g/dL (ref 1.5–4.5)
Glucose: 83 mg/dL (ref 70–99)
Potassium: 4 mmol/L (ref 3.5–5.2)
Sodium: 144 mmol/L (ref 134–144)
Total Protein: 6.6 g/dL (ref 6.0–8.5)
eGFR: 48 mL/min/{1.73_m2} — ABNORMAL LOW (ref >59)

## 2022-04-07 LAB — VITAMIN B12: Vitamin B-12: 287 pg/mL (ref 232–1245)

## 2022-04-07 LAB — VITAMIN D, 25-HYDROXY, TOTAL: Vitamin D, 25-Hydroxy, Serum: 43 ng/mL

## 2022-04-08 NOTE — Progress Notes (Signed)
Subjective:    Patient ID: [name], female    DOB: 03-26-1966, 56 y.o.   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 FVC 2.39/87%, FEV1 2.04/89%, FEV1/FVC 2.04, FEF 25-75 percent 2.59/87%. Unattended Home Sleep Test 07/15/16-[facility] 13.9/hour, desaturation to 74%, body weight 240 pounds Resp Allergy Profile 06/29/16- Pos dog and timothy grass, IgE 41 HST 10/07/17-[facility] 6.9/hour, desaturation to 81%, body weight 249 pounds -----------------------------------------------------------------------.  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 Green sputum now white. Says Duoneb and depomedrol seem to work best.  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 ACT score 11. Using neb every 4 hours. Doesn't recognize fever or obvious infection. Questions  weather. Discussed potential to evolve more obvious viral syndrome.  ROS-see HPI    + = positive Constitutional:     weight loss, night sweats, fevers, chills, +fatigue, lassitude. HEENT:   +headaches, difficulty swallowing, tooth/dental problems, sore throat,       No-  sneezing, itching, ear ache, nasal congestion, post nasal drip,  CV:    chest pain, orthopnea, PND, swelling in lower extremities, anasarca,  dizziness, +palpitations Resp: + shortness of breath with exertion or at rest.                + productive cough, non-productive cough,  No- coughing up of blood.                  change in color of mucus.  wheezing.   Skin: No-   rash or lesions. GI:  No-   heartburn, indigestion, abdominal pain, nausea, vomiting,  GU:  MS:  No-   joint pain or swelling.   Neuro-     nothing unusual Psych:  No- change in mood or affect. + depression or anxiety.  No memory loss.  OBJ- Physical Exam General- Alert, Oriented, Affect -appropriatel, Distress- none acute,  + morbidly obese Skin- rash-none, lesions- none, excoriation- none Lymphadenopathy- none Head- atraumatic            Eyes- Gross vision intact, PERRLA, conjunctivae and secretions clear            Ears- Hearing, canals-normal            Nose- +stuffy, no-Septal dev, mucus, polyps, erosion, perforation             Throat- Mallampati III , mucosa clear , drainage- none, tonsils- atrophic Neck- flexible , trachea midline, no stridor , thyroid nl, carotid no bruit Chest - symmetrical excursion ,  unlabored           Heart/CV- RRR , no murmur , no gallop  , no rub, nl s1 s2                           - JVD- none , edema- none, stasis changes- none, varices- none           Lung-  +few scattered crackles, wheeze -none, cough+dry, dullness-none, rub- none           Chest wall-  Abd-  Br/ Gen/ Rectal- Not done, not indicated Extrem- cyanosis- none, clubbing, none, atrophy- none, strength- nl, cane Neuro- grossly intact to observation     Assessment & Plan:

## 2022-04-09 ENCOUNTER — Ambulatory Visit (INDEPENDENT_AMBULATORY_CARE_PROVIDER_SITE_OTHER): Payer: 59 | Admitting: Internal Medicine

## 2022-04-09 ENCOUNTER — Other Ambulatory Visit (HOSPITAL_COMMUNITY): Payer: Self-pay

## 2022-04-09 ENCOUNTER — Encounter: Payer: Self-pay | Admitting: Internal Medicine

## 2022-04-09 VITALS — BP 114/78 | HR 79 | Ht 63.0 in | Wt 241.2 lb

## 2022-04-09 DIAGNOSIS — J45901 Unspecified asthma with (acute) exacerbation: Secondary | ICD-10-CM

## 2022-04-09 DIAGNOSIS — R438 Other disturbances of smell and taste: Secondary | ICD-10-CM

## 2022-04-09 DIAGNOSIS — U099 Post covid-19 condition, unspecified: Secondary | ICD-10-CM | POA: Diagnosis not present

## 2022-04-09 MED ORDER — HYDROCOD POLI-CHLORPHE POLI ER 10-8 MG/5ML PO SUER
5.0000 mL | Freq: Two times a day (BID) | ORAL | 0 refills | Status: DC | PRN
  Filled 2022-04-09: qty 115, 12d supply, fill #0

## 2022-04-09 MED ORDER — METHYLPREDNISOLONE ACETATE 80 MG/ML IJ SUSP
80.0000 mg | Freq: Once | INTRAMUSCULAR | Status: AC
  Administered 2022-04-09: 80 mg via INTRAMUSCULAR

## 2022-04-09 NOTE — Patient Instructions (Signed)
Order- Depo 42  Script sent refilling Tussionex

## 2022-04-12 NOTE — Progress Notes (Signed)
Chief Complaint:   OBESITY [name] is here to discuss her progress with her obesity treatment plan along with follow-up of her obesity related diagnoses. [name] is on the Category 3 Plan and states she is following her eating plan approximately 60% of the time. [name] states she is not exercising.  Today's visit was #: 5 Starting weight: 230 LBS Starting date: 12/12/2021 Today's weight: 232 LBS Today's date: 04/03/2022 Total lbs lost to date: 0 Total lbs lost since last in-office visit: +4 LBS  Interim History: Patient has replaced breakfast with a protein shake and fresh fruit.  Working days.  Her mother has just finished chemo and radiation.  She is eating 3 meals per day.  She denies stress eating.  She has been meal  skipping less on the  lower dose of Wegovy.  Subjective:   1. Pre-diabetes Patient retry metformin 500 mg XR once a day but had to discontinue due to diarrhea.  She is working on reducing sugar intake.  She has cut out candy intake.  2. Polyphagia Wegovy dose was decreased from 2.4 mg to 1.7 mg weekly at last visit.  This has helped her to not skip meals and increase protein intake.  She denies GI upset from [facility] with Zofran as needed.  3. Vitamin D deficiency Patient is on prescription vitamin D weekly.  Last vitamin D level was 21.7 on 12/12/2021.  4. B12 deficiency Patient is on B12 1000 mcg daily.  Energy level is stable.  5. Essential hypertension Blood pressure at work 120s-130s/70s -80s.  Blood pressure is elevated today.  Patient states HCTZ 25 mg daily and Hyzaar 50/12.5 mg daily.  Assessment/Plan:   1. Pre-diabetes Continue a low sugar diet.  Discontinue metformin due to GI intolerance.  Check labs today. - Hemoglobin A1c  2. Polyphagia Refill- Semaglutide-Weight Management (WEGOVY) 1.7 MG/0.75ML SOAJ; Inject 1.7 mg into the skin once a week.  Dispense: 3 mL; Refill: 0  Refill- ondansetron (ZOFRAN-ODT) 4 MG disintegrating tablet; Dissolve 1  tablet (4 mg total) by mouth every 8 (eight) hours as needed for nausea or vomiting.  Dispense: 20 tablet; Refill: 1  3. Vitamin D deficiency Recheck vitamin D level today.  - Vitamin D, 25-Hydroxy, Total  Refill- Vitamin D, Ergocalciferol, (DRISDOL) 1.25 MG (50000 UNIT) CAPS capsule; Take 1 capsule by mouth every 7 days.  Dispense: 5 capsule; Refill: 0  4. B12 deficiency Check B12 level today.  - Vitamin B12  Refill- cyanocobalamin (VITAMIN B12) 1000 MCG tablet; Take 1 tablet (1,000 mcg total) by mouth daily.  Dispense: 30 tablet; Refill: 0  5. Essential hypertension Continue current blood pressure medication.  Contact PCP if blood pressures are consistently greater than 140/90.  Check labs today.  - Comprehensive metabolic panel  6. Obesity, current BMI 41.1 Firm up plan to add in exercise 3 days/week.  Refill- Semaglutide-Weight Management (WEGOVY) 1.7 MG/0.75ML SOAJ; Inject 1.7 mg into the skin once a week.  Dispense: 3 mL; Refill: 0  Refill- ondansetron (ZOFRAN-ODT) 4 MG disintegrating tablet; Dissolve 1 tablet (4 mg total) by mouth every 8 (eight) hours as needed for nausea or vomiting.  Dispense: 20 tablet; Refill: 1  [name] is currently in the action stage of change. As such, her goal is to continue with weight loss efforts. She has agreed to the Category 3 Plan.   Exercise goals:  As is.  Behavioral modification strategies: increasing lean protein intake, increasing water intake, decreasing eating out, no skipping meals, meal planning  and cooking strategies, holiday eating strategies , and planning for success.  Ivan has agreed to follow-up with our clinic in 4 weeks. She was informed of the importance of frequent follow-up visits to maximize her success with intensive lifestyle modifications for her multiple health conditions.   Objective:   Blood pressure (!) 157/92, pulse 65, temperature 98.4 F (36.9 C), height '5\' 3"'$  (1.6 m), weight 232 lb (105.2 kg), SpO2 90  %. Body mass index is 41.1 kg/m.  General: Cooperative, alert, well developed, in no acute distress. HEENT: Conjunctivae and lids unremarkable. Cardiovascular: Regular rhythm.  Lungs: Normal work of breathing. Neurologic: No focal deficits.   Lab Results  Component Value Date   CREATININE 1.31 (H) 04/03/2022   BUN 24 04/03/2022   NA 144 04/03/2022   K 4.0 04/03/2022   CL 102 04/03/2022   CO2 29 04/03/2022   Lab Results  Component Value Date   ALT 18 04/03/2022   AST 20 04/03/2022   ALKPHOS 96 04/03/2022   BILITOT 0.2 04/03/2022   Lab Results  Component Value Date   HGBA1C 5.9 (H) 04/03/2022   HGBA1C 5.9 (H) 12/12/2021   Lab Results  Component Value Date   INSULIN 9.2 12/12/2021   Lab Results  Component Value Date   TSH 1.420 12/12/2021   Lab Results  Component Value Date   CHOL 193 12/12/2021   HDL 57 12/12/2021   LDLCALC 122 (H) 12/12/2021   TRIG 75 12/12/2021   CHOLHDL 3.4 12/12/2021   Lab Results  Component Value Date   VD25OH 21.7 (L) 12/12/2021   Lab Results  Component Value Date   WBC 6.2 12/12/2021   HGB 12.2 12/12/2021   HCT 37.7 12/12/2021   MCV 82 12/12/2021   PLT 425 12/12/2021   Lab Results  Component Value Date   FERRITIN 188 03/16/2019   Attestation Statements:   Reviewed by clinician on day of visit: allergies, medications, problem list, medical history, surgical history, family history, social history, and previous encounter notes.  I, Davy Pique, am acting as Location manager for Loyal Gambler, DO.  I have reviewed the above documentation for accuracy and completeness, and I agree with the above. Dell Ponto, DO

## 2022-04-18 DIAGNOSIS — M170 Bilateral primary osteoarthritis of knee: Secondary | ICD-10-CM | POA: Diagnosis not present

## 2022-04-19 ENCOUNTER — Other Ambulatory Visit (HOSPITAL_COMMUNITY): Payer: Self-pay

## 2022-04-19 ENCOUNTER — Other Ambulatory Visit (INDEPENDENT_AMBULATORY_CARE_PROVIDER_SITE_OTHER): Payer: Self-pay | Admitting: Family Medicine

## 2022-04-19 DIAGNOSIS — E66813 Obesity, class 3: Secondary | ICD-10-CM

## 2022-04-20 ENCOUNTER — Other Ambulatory Visit (HOSPITAL_COMMUNITY): Payer: Self-pay

## 2022-04-20 MED ORDER — CYCLOBENZAPRINE HCL 5 MG PO TABS
5.0000 mg | ORAL_TABLET | Freq: Three times a day (TID) | ORAL | 1 refills | Status: DC | PRN
  Filled 2022-04-20: qty 90, 30d supply, fill #0
  Filled 2022-06-30: qty 90, 30d supply, fill #1

## 2022-04-20 MED ORDER — TRAMADOL HCL 50 MG PO TABS
100.0000 mg | ORAL_TABLET | Freq: Four times a day (QID) | ORAL | 0 refills | Status: DC | PRN
  Filled 2022-04-20: qty 40, 5d supply, fill #0

## 2022-04-20 MED ORDER — IBUPROFEN 800 MG PO TABS
800.0000 mg | ORAL_TABLET | Freq: Three times a day (TID) | ORAL | 1 refills | Status: DC | PRN
  Filled 2022-04-20: qty 90, 30d supply, fill #0
  Filled 2022-06-30: qty 90, 30d supply, fill #1

## 2022-04-26 ENCOUNTER — Other Ambulatory Visit (HOSPITAL_COMMUNITY): Payer: Self-pay

## 2022-04-26 MED ORDER — TRAMADOL HCL 50 MG PO TABS
50.0000 mg | ORAL_TABLET | Freq: Four times a day (QID) | ORAL | 0 refills | Status: DC
  Filled 2022-04-26: qty 30, 8d supply, fill #0

## 2022-04-28 ENCOUNTER — Other Ambulatory Visit (HOSPITAL_COMMUNITY): Payer: Self-pay

## 2022-04-30 ENCOUNTER — Other Ambulatory Visit (HOSPITAL_COMMUNITY): Payer: Self-pay

## 2022-05-01 ENCOUNTER — Other Ambulatory Visit (HOSPITAL_COMMUNITY): Payer: Self-pay

## 2022-05-06 ENCOUNTER — Encounter: Payer: Self-pay | Admitting: Internal Medicine

## 2022-05-06 NOTE — Assessment & Plan Note (Addendum)
Treating this for now as asthma exacerbation. Discussed use of meds she already has. Plan- depo 80, refill tussionex. Consider trying another Biologic

## 2022-05-06 NOTE — Assessment & Plan Note (Signed)
Not improved. Discussed steroid strategies.

## 2022-05-08 ENCOUNTER — Encounter (INDEPENDENT_AMBULATORY_CARE_PROVIDER_SITE_OTHER): Payer: Self-pay | Admitting: Family Medicine

## 2022-05-08 ENCOUNTER — Ambulatory Visit (INDEPENDENT_AMBULATORY_CARE_PROVIDER_SITE_OTHER): Payer: Commercial Managed Care - PPO | Admitting: Family Medicine

## 2022-05-08 VITALS — BP 163/86 | HR 96 | Temp 98.2°F | Ht 63.0 in | Wt 228.0 lb

## 2022-05-08 DIAGNOSIS — R7303 Prediabetes: Secondary | ICD-10-CM

## 2022-05-08 DIAGNOSIS — N1831 Chronic kidney disease, stage 3a: Secondary | ICD-10-CM | POA: Diagnosis not present

## 2022-05-08 DIAGNOSIS — Z6841 Body Mass Index (BMI) 40.0 and over, adult: Secondary | ICD-10-CM

## 2022-05-08 DIAGNOSIS — E538 Deficiency of other specified B group vitamins: Secondary | ICD-10-CM

## 2022-05-08 DIAGNOSIS — I129 Hypertensive chronic kidney disease with stage 1 through stage 4 chronic kidney disease, or unspecified chronic kidney disease: Secondary | ICD-10-CM

## 2022-05-08 DIAGNOSIS — E669 Obesity, unspecified: Secondary | ICD-10-CM

## 2022-05-08 DIAGNOSIS — R430 Anosmia: Secondary | ICD-10-CM

## 2022-05-08 DIAGNOSIS — R632 Polyphagia: Secondary | ICD-10-CM

## 2022-05-08 DIAGNOSIS — E559 Vitamin D deficiency, unspecified: Secondary | ICD-10-CM

## 2022-05-08 DIAGNOSIS — E66813 Obesity, class 3: Secondary | ICD-10-CM

## 2022-05-08 DIAGNOSIS — U099 Post covid-19 condition, unspecified: Secondary | ICD-10-CM

## 2022-05-08 DIAGNOSIS — I10 Essential (primary) hypertension: Secondary | ICD-10-CM

## 2022-05-08 MED ORDER — VITAMIN D (ERGOCALCIFEROL) 1.25 MG (50000 UNIT) PO CAPS
50000.0000 [IU] | ORAL_CAPSULE | ORAL | 0 refills | Status: DC
  Filled 2022-05-08 – 2022-05-24 (×2): qty 5, 35d supply, fill #0

## 2022-05-08 MED ORDER — WEGOVY 1 MG/0.5ML ~~LOC~~ SOAJ
1.0000 mg | SUBCUTANEOUS | 0 refills | Status: DC
  Filled 2022-05-08 – 2022-06-01 (×2): qty 2, 28d supply, fill #0

## 2022-05-08 MED ORDER — ONDANSETRON 4 MG PO TBDP
4.0000 mg | ORAL_TABLET | Freq: Three times a day (TID) | ORAL | 1 refills | Status: DC | PRN
  Filled 2022-05-08 – 2022-05-24 (×2): qty 20, 7d supply, fill #0
  Filled 2022-09-30: qty 20, 7d supply, fill #1

## 2022-05-09 ENCOUNTER — Other Ambulatory Visit (HOSPITAL_COMMUNITY): Payer: Self-pay

## 2022-05-09 DIAGNOSIS — U099 Post covid-19 condition, unspecified: Secondary | ICD-10-CM | POA: Insufficient documentation

## 2022-05-09 DIAGNOSIS — N1831 Chronic kidney disease, stage 3a: Secondary | ICD-10-CM | POA: Insufficient documentation

## 2022-05-15 ENCOUNTER — Other Ambulatory Visit (HOSPITAL_COMMUNITY): Payer: Self-pay

## 2022-05-18 ENCOUNTER — Other Ambulatory Visit (HOSPITAL_COMMUNITY): Payer: Self-pay

## 2022-05-24 ENCOUNTER — Other Ambulatory Visit (HOSPITAL_COMMUNITY): Payer: Self-pay

## 2022-05-24 ENCOUNTER — Other Ambulatory Visit: Payer: Self-pay

## 2022-05-28 ENCOUNTER — Encounter (INDEPENDENT_AMBULATORY_CARE_PROVIDER_SITE_OTHER): Payer: Self-pay | Admitting: *Deleted

## 2022-05-28 ENCOUNTER — Other Ambulatory Visit (HOSPITAL_COMMUNITY): Payer: Self-pay

## 2022-05-28 DIAGNOSIS — E782 Mixed hyperlipidemia: Secondary | ICD-10-CM | POA: Diagnosis not present

## 2022-05-28 DIAGNOSIS — R7303 Prediabetes: Secondary | ICD-10-CM | POA: Diagnosis not present

## 2022-05-28 DIAGNOSIS — J4550 Severe persistent asthma, uncomplicated: Secondary | ICD-10-CM | POA: Diagnosis not present

## 2022-05-28 DIAGNOSIS — K219 Gastro-esophageal reflux disease without esophagitis: Secondary | ICD-10-CM | POA: Diagnosis not present

## 2022-05-28 DIAGNOSIS — I10 Essential (primary) hypertension: Secondary | ICD-10-CM | POA: Diagnosis not present

## 2022-05-28 MED ORDER — LEVALBUTEROL TARTRATE 45 MCG/ACT IN AERO
2.0000 | INHALATION_SPRAY | RESPIRATORY_TRACT | 9 refills | Status: DC
  Filled 2022-05-28: qty 15, 30d supply, fill #0

## 2022-05-28 MED ORDER — DILTIAZEM HCL ER COATED BEADS 180 MG PO CP24
360.0000 mg | ORAL_CAPSULE | Freq: Every day | ORAL | 1 refills | Status: DC
  Filled 2022-05-28: qty 180, 90d supply, fill #0
  Filled 2022-09-30: qty 180, 90d supply, fill #1

## 2022-05-28 MED ORDER — LEVALBUTEROL HCL 1.25 MG/0.5ML IN NEBU
1.2500 mg | INHALATION_SOLUTION | Freq: Three times a day (TID) | RESPIRATORY_TRACT | 6 refills | Status: AC
  Filled 2022-05-28: qty 45, 30d supply, fill #0

## 2022-05-28 MED ORDER — HYDROCHLOROTHIAZIDE 25 MG PO TABS
25.0000 mg | ORAL_TABLET | Freq: Every day | ORAL | 1 refills | Status: DC
  Filled 2022-05-28: qty 90, 90d supply, fill #0
  Filled 2022-09-30: qty 90, 90d supply, fill #1

## 2022-05-28 MED ORDER — BUDESONIDE-FORMOTEROL FUMARATE 160-4.5 MCG/ACT IN AERO
INHALATION_SPRAY | RESPIRATORY_TRACT | 9 refills | Status: DC
  Filled 2022-05-28: qty 10.2, 30d supply, fill #0

## 2022-05-31 ENCOUNTER — Telehealth (INDEPENDENT_AMBULATORY_CARE_PROVIDER_SITE_OTHER): Payer: Self-pay | Admitting: *Deleted

## 2022-05-31 NOTE — Telephone Encounter (Signed)
Prior authorization done via cover my meds for patients Wegovy. Waiting on determination.  [name] (Key: [id number]) Mancel Parsons '1MG'$ /0.5ML auto-

## 2022-05-31 NOTE — Progress Notes (Signed)
Chief Complaint:   OBESITY [name] is here to discuss her progress with her obesity treatment plan along with follow-up of her obesity related diagnoses. [name] is on the Category 3 Plan and states she is following her eating plan approximately 60% of the time. [name] states she is riding her stationary bike for 2-3 miles 30-45 minutes 3-5 times per week.  Today's visit was #: 6 Starting weight: 230 lbs Starting date: 12/12/2021 Today's weight: 228 lbs Today's date: 05/08/2022 Total lbs lost to date: 2 lbs Total lbs lost since last in-office visit: 4 lbs  Interim History: Patient is still doing some meal skipping.  Had one episode of vomiting after eating some of a donut. Some sugar carvings.  Reduced Wegovy from 2.4 mg to 1.7 mg, but has not seen much change.  Mom has finished chemo.  Has work stress.   Subjective:   1. Pre-diabetes Last A1c was 5.9.  Patient has stopped metformin due to diarrhea.  Patient continues to consume sweets related to high stress.  2. Vitamin D deficiency Discussed labs with patient today. Improving.  Vitamin D level 41.7 to 43 prescription vitamin D 50,000 IU weekly.  Lab reviewed from 04/03/2022.  3. Vitamin B12 deficiency Discussed labs with patient today. B12 level improvement 72 to 287.  Patient is taking OTC vitamin B12 1000 mcg daily.  Energy level is improving.  4. Stage 3a chronic kidney disease ([name]) Worsening. Discussed labs with patient today. Taking ibuprofen 800 mg BID, everyday and sometimes without food.  Creatinine 1.3, GFR 48.  She is taking ibuprofen for knee arthritis.  She has hypertension, poorly controlled.    5. 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  Patient started to taste a little bit.  Fatigue starting to improve.  Long COVID symptoms have been a barrier to her progress without loss due to fatigue and with lack of exercise.  6. Essential hypertension Worsening.  Blood pressure elevated  today.  She blames work stress.  Patient is taking amlodipine 10 mg daily, HCTZ 25 mg daily, Hyzaar 50/12.5 mg daily.  Patient denies headaches or chest pain today.  Assessment/Plan:   1. Pre-diabetes Look for A1c improvement following Wegovy.  Trade out high sugar snacks for more protein and fiber, reviewed options with patient today.  2. Vitamin D deficiency Continue prescription vitamin D 50,000 IU weekly.  Refill- Vitamin D, Ergocalciferol, (DRISDOL) 1.25 MG (50000 UNIT) CAPS capsule; Take 1 capsule by mouth every 7 days.  Dispense: 5 capsule; Refill: 0  3. Vitamin B12 deficiency Continue vitamin B12 1000 mcg daily.  Recheck in 3 months.  4. Stage 3a chronic kidney disease ([place]) Work on improving blood pressure control.  Monitor blood pressure at work.  Discontinue ibuprofen.  Try Tylenol arthritis 2-3 times per day as needed.  Recheck renal function in 1 to 2 months.  5. Long COVID Continue to focus on proper sleep and taking recommended supplements and good nutrition.  6. Essential hypertension Monitor blood pressure at rest, she can check at work 2 times per week with a goal of<140/90.  Take all blood pressure medication as prescribed.  Follow-up with PCP.  7. Obesity, current BMI 40.5 Use a protein shake or bar in place of meal skipping.   Reduce Wegovy 1 mg.  Weekly due to meal skipping and nausea.   Start - ondansetron (ZOFRAN-ODT) 4 MG disintegrating tablet; Dissolve 1 tablet (4 mg total) by mouth every 8 (eight) hours  as needed for nausea or vomiting.  Dispense: 20 tablet; Refill: 1  Refill - Semaglutide-Weight Management (WEGOVY) 1 MG/0.5ML SOAJ; Inject 1 mg into the skin once a week.  Dispense: 2 mL; Refill: 0  [name] is currently in the action stage of change. As such, her goal is to continue with weight loss efforts. She has agreed to the Category 3 Plan.   Exercise goals:  As is.   Behavioral modification strategies: increasing lean protein intake, increasing  water intake, no skipping meals, meal planning and cooking strategies, emotional eating strategies, and planning for success.  [name] has agreed to follow-up with our clinic in 4 weeks. She was informed of the importance of frequent follow-up visits to maximize her success with intensive lifestyle modifications for her multiple health conditions.   Objective:   Blood pressure (!) 163/86, pulse 96, temperature 98.2 F (36.8 C), height 5' 3"$  (1.6 m), weight 228 lb (103.4 kg), SpO2 97 %. Body mass index is 40.39 kg/m.  General: Cooperative, alert, well developed, in no acute distress. HEENT: Conjunctivae and lids unremarkable. Cardiovascular: Regular rhythm.  Lungs: Normal work of breathing. Neurologic: No focal deficits.   Lab Results  Component Value Date   CREATININE 1.31 (H) 04/03/2022   BUN 24 04/03/2022   NA 144 04/03/2022   K 4.0 04/03/2022   CL 102 04/03/2022   CO2 29 04/03/2022   Lab Results  Component Value Date   ALT 18 04/03/2022   AST 20 04/03/2022   ALKPHOS 96 04/03/2022   BILITOT 0.2 04/03/2022   Lab Results  Component Value Date   HGBA1C 5.9 (H) 04/03/2022   HGBA1C 5.9 (H) 12/12/2021   Lab Results  Component Value Date   INSULIN 9.2 12/12/2021   Lab Results  Component Value Date   TSH 1.420 12/12/2021   Lab Results  Component Value Date   CHOL 193 12/12/2021   HDL 57 12/12/2021   LDLCALC 122 (H) 12/12/2021   TRIG 75 12/12/2021   CHOLHDL 3.4 12/12/2021   Lab Results  Component Value Date   VD25OH 21.7 (L) 12/12/2021   Lab Results  Component Value Date   WBC 6.2 12/12/2021   HGB 12.2 12/12/2021   HCT 37.7 12/12/2021   MCV 82 12/12/2021   PLT 425 12/12/2021   Lab Results  Component Value Date   FERRITIN 188 03/16/2019   Attestation Statements:   Reviewed by clinician on day of visit: allergies, medications, problem list, medical history, surgical history, family history, social history, and previous encounter notes.  I have  personally spent 40 minutes total time today in preparation, patient care,nutritional counseling and documentation for this visit, including the following: review of clinical lab tests; review of medical tests/procedures/services.    I, Davy Pique, am acting as Location manager for Loyal Gambler, DO.  I have reviewed the above documentation for accuracy and completeness, and I agree with the above. Dell Ponto, DO

## 2022-06-01 ENCOUNTER — Other Ambulatory Visit (HOSPITAL_COMMUNITY): Payer: Self-pay

## 2022-06-02 ENCOUNTER — Other Ambulatory Visit (HOSPITAL_COMMUNITY): Payer: Self-pay

## 2022-06-04 NOTE — Telephone Encounter (Signed)
Prior authorization for patients [facility] approved. Patient notified.  05/31/2022 to 05/31/2023

## 2022-06-05 ENCOUNTER — Encounter (INDEPENDENT_AMBULATORY_CARE_PROVIDER_SITE_OTHER): Payer: Self-pay | Admitting: Family Medicine

## 2022-06-05 ENCOUNTER — Ambulatory Visit (INDEPENDENT_AMBULATORY_CARE_PROVIDER_SITE_OTHER): Payer: Commercial Managed Care - PPO | Admitting: Family Medicine

## 2022-06-05 ENCOUNTER — Other Ambulatory Visit (HOSPITAL_COMMUNITY): Payer: Self-pay

## 2022-06-05 VITALS — BP 170/91 | HR 74 | Temp 98.4°F | Ht 63.0 in | Wt 232.0 lb

## 2022-06-05 DIAGNOSIS — M170 Bilateral primary osteoarthritis of knee: Secondary | ICD-10-CM

## 2022-06-05 DIAGNOSIS — E559 Vitamin D deficiency, unspecified: Secondary | ICD-10-CM

## 2022-06-05 DIAGNOSIS — I10 Essential (primary) hypertension: Secondary | ICD-10-CM | POA: Diagnosis not present

## 2022-06-05 DIAGNOSIS — K5900 Constipation, unspecified: Secondary | ICD-10-CM

## 2022-06-05 DIAGNOSIS — E538 Deficiency of other specified B group vitamins: Secondary | ICD-10-CM | POA: Diagnosis not present

## 2022-06-05 DIAGNOSIS — R7303 Prediabetes: Secondary | ICD-10-CM

## 2022-06-05 DIAGNOSIS — Z6841 Body Mass Index (BMI) 40.0 and over, adult: Secondary | ICD-10-CM | POA: Diagnosis not present

## 2022-06-05 DIAGNOSIS — E66813 Obesity, class 3: Secondary | ICD-10-CM

## 2022-06-05 MED ORDER — WEGOVY 1 MG/0.5ML ~~LOC~~ SOAJ
1.0000 mg | SUBCUTANEOUS | 0 refills | Status: DC
  Filled 2022-06-20 – 2022-06-23 (×2): qty 2, 28d supply, fill #0

## 2022-06-05 NOTE — Assessment & Plan Note (Signed)
Worsened with a higher protein diet Has miralax for prn use Consumes some fruits and veggies daily  Increase water intake to > 64 oz/ day Use Miralax once daily prn  Increase non starchy veggies

## 2022-06-05 NOTE — Progress Notes (Signed)
Office: [phone]  /  Fax: [phone]  WEIGHT SUMMARY AND BIOMETRICS  Medical Weight Loss Height: 5' 3"$  (1.6 m) Weight: 232 lb (105.2 kg) Temp: 98.4 F (36.9 C) Pulse Rate: 74 BP: (!) 170/91 SpO2: 99 % Fasting: no Labs: no Today's Visit #: 7 Weight at Last VIsit: 228lb Weight Lost Since Last Visit: +4  Body Fat %: 47.2 % Fat Mass (lbs): 109.6 lbs Muscle Mass (lbs): 116.2 lbs Total Body Water (lbs): 90.4 lbs Visceral Fat Rating : 15 Starting Date: 12/12/21 Starting Weight: 230 Total Weight Loss (lbs): 0 lb (0 kg)    HPI  Chief Complaint: OBESITY  [place] is here to discuss her progress with her obesity treatment plan. She is on the the Category 3 Plan and states she is following her eating plan approximately 70 % of the time. She states she is walking 20-30 minutes 5 times per week.   Interval History:  Since last office visit she up 4 lb since her last visit.  She did reduce Wegovy down to 1 mg dose.  Nausea has improved with dose reduction though she had GI upset after eating ice cream.  Still skipping meals and has job stress but this is improving. Plans to get outside and walk more in the coming weeks.     Pharmacotherapy: Wegovy 1 mg [place] weekly  PHYSICAL EXAM:  Blood pressure (!) 170/91, pulse 74, temperature 98.4 F (36.9 C), height 5' 3"$  (1.6 m), weight 232 lb (105.2 kg), SpO2 99 %. Body mass index is 41.1 kg/m.  General: She is overweight, cooperative, alert, well developed, and in no acute distress. PSYCH: Has normal mood, affect and thought process.   HEENT: EOMI, sclerae are anicteric. Lungs: Normal breathing effort, no conversational dyspnea. Extremities: No edema.  Neurologic: No gross sensory or motor deficits. No tremors or fasciculations noted.    DIAGNOSTIC DATA REVIEWED:  BMET    Component Value Date/Time   NA 144 04/03/2022 1455   K 4.0 04/03/2022 1455   CL 102 04/03/2022 1455   CO2 29 04/03/2022 1455   GLUCOSE 83 04/03/2022  1455   GLUCOSE 117 (H) 02/13/2021 1437   BUN 24 04/03/2022 1455   CREATININE 1.31 (H) 04/03/2022 1455   CALCIUM 10.0 04/03/2022 1455   GFRNONAA 49 (L) 02/13/2021 1437   GFRAA 53 (L) 03/16/2019 1834   Lab Results  Component Value Date   HGBA1C 5.9 (H) 04/03/2022   HGBA1C 5.9 (H) 12/12/2021   Lab Results  Component Value Date   INSULIN 9.2 12/12/2021   Lab Results  Component Value Date   TSH 1.420 12/12/2021   CBC    Component Value Date/Time   WBC 6.2 12/12/2021 0947   WBC 21.9 (H) 02/13/2021 1437   RBC 4.61 12/12/2021 0947   RBC 4.81 02/13/2021 1437   HGB 12.2 12/12/2021 0947   HCT 37.7 12/12/2021 0947   PLT 425 12/12/2021 0947   MCV 82 12/12/2021 0947   MCH 26.5 (L) 12/12/2021 0947   MCH 26.0 02/13/2021 1437   MCHC 32.4 12/12/2021 0947   MCHC 31.8 02/13/2021 1437   RDW 13.9 12/12/2021 0947   Iron Studies    Component Value Date/Time   FERRITIN 188 03/16/2019 1834   Lipid Panel     Component Value Date/Time   CHOL 193 12/12/2021 0947   TRIG 75 12/12/2021 0947   HDL 57 12/12/2021 0947   CHOLHDL 3.4 12/12/2021 0947   LDLCALC 122 (H) 12/12/2021 0947   Hepatic Function Panel  Component Value Date/Time   PROT 6.6 04/03/2022 1455   ALBUMIN 4.5 04/03/2022 1455   AST 20 04/03/2022 1455   ALT 18 04/03/2022 1455   ALKPHOS 96 04/03/2022 1455   BILITOT 0.2 04/03/2022 1455      Component Value Date/Time   TSH 1.420 12/12/2021 0947   Nutritional Lab Results  Component Value Date   VD25OH 21.7 (L) 12/12/2021     ASSESSMENT AND PLAN  TREATMENT PLAN FOR OBESITY:  Recommended Dietary Goals  Gabby is currently in the action stage of change. As such, her goal is to continue weight management plan. She has agreed to the Category 3 Plan.  Behavioral Intervention  We discussed the following Behavioral Modification Strategies today: increasing lean protein intake, increasing vegetables, increase water intake, work on meal planning and easy cooking  plans, and think about ways to increase physical activity.  Additional resources provided today: NA  Recommended Physical Activity Goals  Ethelmae has been advised to work up to 150 minutes of moderate intensity aerobic activity a week and strengthening exercises 2-3 times per week for cardiovascular health, weight loss maintenance and preservation of muscle mass.   She has agreed to Will begin regular aerobic exercise 30 minutes, 3 times per week. Chosen activity walking.   Pharmacotherapy We discussed various medication options to help Chestine with her weight loss efforts and we both agreed to continuing Wegovy 1 mg once weekly injection.  ASSOCIATED CONDITIONS ADDRESSED TODAY  Essential hypertension Assessment & Plan: BP is elevated Met with PCP recently and BP meds have been adjusted Take all BP meds as prescribed Limit sodium intake   Obesity,current BMI 41.1 Assessment & Plan: Patient has net weight loss of 0 pounds and starting year in August though did lose weight prior to coming to this practice.  She feels like she is losing inches and has had struggles taking to her prescribed meal plan.  Exercise has been limited due to job stress.  We discussed adding in more regular walking, making time for self-care, meal planning and caring lunch to work.  Avoid high sugar dried fruits, juices and ice cream.  100-calorie snack list provided today.  Orders: -     Wegovy; Inject 1 mg into the skin once a week.  Dispense: 2 mL; Refill: 0  Morbid obesity ([place]) Assessment & Plan: Patient has net weight loss of 0 pounds and starting year in August though did lose weight prior to coming to this practice.  She feels like she is losing inches and has had struggles taking to her prescribed meal plan.  Exercise has been limited due to job stress.  We discussed adding in more regular walking, making time for self-care, meal planning and caring lunch to work.  Avoid high sugar dried fruits, juices  and ice cream.  100-calorie snack list provided today.   Constipation, unspecified constipation type Assessment & Plan: Worsened with a higher protein diet Has miralax for prn use Consumes some fruits and veggies daily  Increase water intake to > 64 oz/ day Use Miralax once daily prn  Increase non starchy veggies   Vitamin B12 deficiency Assessment & Plan: Taking OTC vitamin B12 1000 mcg daily.  Energy level is improving.  Denies paresthesias or neuropathy.  Recheck B12 level in 2 to 3 months  Lab Results  Component Value Date   VITAMINB12 287 04/03/2022      Prediabetes Assessment & Plan: Continuing to monitor sugar intake though has some dried fruit, juice and ice  cream on occasion.  Counseled patient on choosing lower sugar options, focus more on protein and fiber with meals and snacks.  Reviewed specific examples.  Off metformin due to adverse side effects.  Last A1c 5.9.  Plan to recheck in April.   Primary osteoarthritis of both knees Assessment & Plan: Bilateral knee pain is limiting some of her exercise.  She has not seen much reduction in knee pain lately though she has seen some weight loss.  She has cut out NSAIDs due to elevated creatinine level.  She has switched to Tylenol arthritis and plans to see Dr. Alvan Dame back for follow-up in the near future.  Continue walking as tolerated.  Continue active plan for weight loss.  Consider water exercise.   Vitamin D deficiency Assessment & Plan: Taking prescription vitamin D 50,000 IU once weekly.  Last vitamin D level in December 43.  Target vitamin D level 50-70.  Energy level is improving.  Recheck vitamin D level in 2 to 3 months.        No follow-ups on file.Marland Kitchen She was informed of the importance of frequent follow up visits to maximize her success with intensive lifestyle modifications for her multiple health conditions.   ATTESTASTION STATEMENTS:  Reviewed by clinician on day of visit: allergies, medications,  problem list, medical history, surgical history, family history, social history, and previous encounter notes.   Time spent on visit including pre-visit chart review and post-visit care and charting was 30 minutes.    Dell Ponto, DO

## 2022-06-05 NOTE — Assessment & Plan Note (Signed)
Taking prescription vitamin D 50,000 IU once weekly.  Last vitamin D level in December 43.  Target vitamin D level 50-70.  Energy level is improving.  Recheck vitamin D level in 2 to 3 months.

## 2022-06-05 NOTE — Assessment & Plan Note (Signed)
Patient has net weight loss of 0 pounds and starting year in August though did lose weight prior to coming to this practice.  She feels like she is losing inches and has had struggles taking to her prescribed meal plan.  Exercise has been limited due to job stress.  We discussed adding in more regular walking, making time for self-care, meal planning and caring lunch to work.  Avoid high sugar dried fruits, juices and ice cream.  100-calorie snack list provided today.

## 2022-06-05 NOTE — Assessment & Plan Note (Signed)
Continuing to monitor sugar intake though has some dried fruit, juice and ice cream on occasion.  Counseled patient on choosing lower sugar options, focus more on protein and fiber with meals and snacks.  Reviewed specific examples.  Off metformin due to adverse side effects.  Last A1c 5.9.  Plan to recheck in April.

## 2022-06-05 NOTE — Assessment & Plan Note (Signed)
BP is elevated Met with PCP recently and BP meds have been adjusted Take all BP meds as prescribed Limit sodium intake

## 2022-06-05 NOTE — Assessment & Plan Note (Signed)
Taking OTC vitamin B12 1000 mcg daily.  Energy level is improving.  Denies paresthesias or neuropathy.  Recheck B12 level in 2 to 3 months  Lab Results  Component Value Date   VITAMINB12 287 04/03/2022

## 2022-06-05 NOTE — Assessment & Plan Note (Signed)
Bilateral knee pain is limiting some of her exercise.  She has not seen much reduction in knee pain lately though she has seen some weight loss.  She has cut out NSAIDs due to elevated creatinine level.  She has switched to Tylenol arthritis and plans to see Dr. Alvan Dame back for follow-up in the near future.  Continue walking as tolerated.  Continue active plan for weight loss.  Consider water exercise.

## 2022-06-07 ENCOUNTER — Other Ambulatory Visit (HOSPITAL_COMMUNITY): Payer: Self-pay

## 2022-06-07 DIAGNOSIS — M25511 Pain in right shoulder: Secondary | ICD-10-CM | POA: Diagnosis not present

## 2022-06-12 ENCOUNTER — Other Ambulatory Visit (HOSPITAL_COMMUNITY): Payer: Self-pay

## 2022-06-20 ENCOUNTER — Other Ambulatory Visit (HOSPITAL_COMMUNITY): Payer: Self-pay

## 2022-06-23 ENCOUNTER — Other Ambulatory Visit (HOSPITAL_COMMUNITY): Payer: Self-pay

## 2022-06-26 ENCOUNTER — Other Ambulatory Visit (HOSPITAL_COMMUNITY): Payer: Self-pay

## 2022-06-30 ENCOUNTER — Other Ambulatory Visit (HOSPITAL_COMMUNITY): Payer: Self-pay

## 2022-07-02 ENCOUNTER — Other Ambulatory Visit (HOSPITAL_COMMUNITY): Payer: Self-pay

## 2022-07-02 ENCOUNTER — Encounter (INDEPENDENT_AMBULATORY_CARE_PROVIDER_SITE_OTHER): Payer: Self-pay | Admitting: Family Medicine

## 2022-07-02 ENCOUNTER — Ambulatory Visit (INDEPENDENT_AMBULATORY_CARE_PROVIDER_SITE_OTHER): Payer: Commercial Managed Care - PPO | Admitting: Family Medicine

## 2022-07-02 VITALS — BP 156/90 | HR 77 | Temp 99.0°F | Ht 63.0 in | Wt 234.0 lb

## 2022-07-02 DIAGNOSIS — Z6841 Body Mass Index (BMI) 40.0 and over, adult: Secondary | ICD-10-CM | POA: Diagnosis not present

## 2022-07-02 DIAGNOSIS — E119 Type 2 diabetes mellitus without complications: Secondary | ICD-10-CM | POA: Insufficient documentation

## 2022-07-02 DIAGNOSIS — I129 Hypertensive chronic kidney disease with stage 1 through stage 4 chronic kidney disease, or unspecified chronic kidney disease: Secondary | ICD-10-CM | POA: Diagnosis not present

## 2022-07-02 DIAGNOSIS — I10 Essential (primary) hypertension: Secondary | ICD-10-CM

## 2022-07-02 DIAGNOSIS — Z7985 Long-term (current) use of injectable non-insulin antidiabetic drugs: Secondary | ICD-10-CM | POA: Diagnosis not present

## 2022-07-02 DIAGNOSIS — N1831 Chronic kidney disease, stage 3a: Secondary | ICD-10-CM | POA: Diagnosis not present

## 2022-07-02 MED ORDER — SEMAGLUTIDE (1 MG/DOSE) 4 MG/3ML ~~LOC~~ SOPN
1.0000 mg | PEN_INJECTOR | SUBCUTANEOUS | 0 refills | Status: DC
  Filled 2022-07-02 – 2022-07-20 (×2): qty 3, 28d supply, fill #0

## 2022-07-02 NOTE — Assessment & Plan Note (Addendum)
Checking BPs at work and running <140/90 Continue HCTZ 25 mg once daily and losartan/HCTZ 50/12.5 mg once daily And Cardizem CD 360 mg once daily

## 2022-07-02 NOTE — Progress Notes (Signed)
Office: [phone]  /  Fax: [phone]  WEIGHT SUMMARY AND BIOMETRICS  Vitals Temp: 99 F (37.2 C) BP: (!) 156/90 Pulse Rate: 77 SpO2: 98 %   Anthropometric Measurements Height: '5\' 3"'$  (1.6 m) Weight: 234 lb (106.1 kg) BMI (Calculated): 41.46 Weight at Last Visit: 232lb Weight Lost Since Last Visit: +2 Starting Weight: 230lb Total Weight Loss (lbs): 0 lb (0 kg)   Body Composition  Body Fat %: 48.2 % Fat Mass (lbs): 113 lbs Muscle Mass (lbs): 115.4 lbs Total Body Water (lbs): 89.6 lbs Visceral Fat Rating : 15   Other Clinical Data Fasting: no Labs: no Today's Visit #: 8 Starting Date: 12/12/21     HPI  Chief Complaint: OBESITY  Avika is here to discuss her progress with her obesity treatment plan. She is on the the Category 3 Plan and states she is following her eating plan approximately 70 % of the time. She states she is exercising 30-60 minutes 3 times per week.   Interval History:  Since last office visit she is up 2 pounds since her last visit This gives her a net weight gain of 4 pounds in the past 7 months of medically supervised weight management. Patient was previously seeing me at my prior practice and that has maintained 44 pounds of weight loss over the past 1-1/2 years of medically supervised weight management.  She has taken both Ozempic and Wegovy with some success but had some GI upset on higher doses of Wegovy.  She is still losing inches.  Job stress has been a barrier to her progress lately. She struggles to get in all of the food on her meal plan.   Pharmacotherapy: Changed to Ozempic 1 mg once weekly injection  PHYSICAL EXAM:  Blood pressure (!) 156/90, pulse 77, temperature 99 F (37.2 C), height '5\' 3"'$  (1.6 m), weight 234 lb (106.1 kg), SpO2 98 %. Body mass index is 41.45 kg/m.  General: She is overweight, cooperative, alert, well developed, and in no acute distress. PSYCH: Has normal mood, affect and thought process.    Lungs: Normal breathing effort, no conversational dyspnea.   ASSESSMENT AND PLAN  TREATMENT PLAN FOR OBESITY:  Recommended Dietary Goals  Sanju is currently in the action stage of change. As such, her goal is to continue weight management plan. She has agreed to the Category 3 Plan.  Behavioral Intervention  We discussed the following Behavioral Modification Strategies today: increasing lean protein intake, increasing vegetables, increasing water intake, work on meal planning and easy cooking plans, and reading food labels .  Additional resources provided today: NA  Recommended Physical Activity Goals  Jauna has been advised to work up to 150 minutes of moderate intensity aerobic activity a week and strengthening exercises 2-3 times per week for cardiovascular health, weight loss maintenance and preservation of muscle mass.   She has agreed to increase physical activity in their day and reduce sedentary time (increase NEAT).  and Will begin resistance exercise 20 minutes, 3 times per week. Chosen activity weight training.  Pharmacotherapy changes for the treatment of obesity:   ASSOCIATED CONDITIONS ADDRESSED TODAY  Type 2 diabetes mellitus without complication, without long-term current use of insulin ([place]) Assessment & Plan: Will change patient back over to Ozempic at 1 mg once weekly injection.  She has previously had success on Ozempic in the past with less GI side effect.  She has actively been working on reducing intake of added sugar and refined carbohydrates, more regular exercise and  weight reduction.    Orders: -     Ambulatory referral to Nephrology -     Semaglutide (1 MG/DOSE); Inject 1 mg as directed once a week.  Dispense: 9 mL; Refill: 0  Essential hypertension Assessment & Plan: Checking BPs at work and running <140/90 Continue HCTZ 25 mg once daily and losartan/HCTZ 50/12.5 mg once daily And Cardizem CD 360 mg once daily  Orders: -     Ambulatory  referral to Nephrology  Stage 3a chronic kidney disease [facility]) Assessment & Plan: Reviewed most recent chemistry panels and patient has consistent renal dysfunction following into stage IIIa CKD.  She has never seen a nephrologist.  Her risk factors for renal disease include hypertension, type 2 diabetes and family history for end-stage renal disease.  She has recently reduced her high intake of NSAIDs.  Referral to nephrology placed.  Remain off NSAIDs.  Orders: -     Ambulatory referral to Nephrology  Morbid obesity ([place])  BMI 40.0-44.9, adult [facility])      She was informed of the importance of frequent follow up visits to maximize her success with intensive lifestyle modifications for her multiple health conditions.   ATTESTASTION STATEMENTS:  Reviewed by clinician on day of visit: allergies, medications, problem list, medical history, surgical history, family history, social history, and previous encounter notes pertinent to obesity diagnosis.   I have personally spent 30 minutes total time today in preparation, patient care, nutritional counseling and documentation for this visit, including the following: review of clinical lab tests; review of medical tests/procedures/services.      Dell Ponto, DO DABFM, DABOM [facility] [address] [place], [place] [address] [phone]

## 2022-07-02 NOTE — Assessment & Plan Note (Signed)
Reviewed most recent chemistry panels and patient has consistent renal dysfunction following into stage IIIa CKD.  She has never seen a nephrologist.  Her risk factors for renal disease include hypertension, type 2 diabetes and family history for end-stage renal disease.  She has recently reduced her high intake of NSAIDs.  Referral to nephrology placed.  Remain off NSAIDs.

## 2022-07-02 NOTE — Assessment & Plan Note (Signed)
Will change patient back over to Ozempic at 1 mg once weekly injection.  She has previously had success on Ozempic in the past with less GI side effect.  She has actively been working on reducing intake of added sugar and refined carbohydrates, more regular exercise and weight reduction.

## 2022-07-20 ENCOUNTER — Other Ambulatory Visit (HOSPITAL_COMMUNITY): Payer: Self-pay

## 2022-07-25 ENCOUNTER — Other Ambulatory Visit (HOSPITAL_COMMUNITY): Payer: Self-pay

## 2022-07-25 ENCOUNTER — Other Ambulatory Visit: Payer: Self-pay

## 2022-07-25 ENCOUNTER — Ambulatory Visit (INDEPENDENT_AMBULATORY_CARE_PROVIDER_SITE_OTHER): Payer: Commercial Managed Care - PPO | Admitting: Student

## 2022-07-25 ENCOUNTER — Encounter: Payer: Self-pay | Admitting: Student

## 2022-07-25 ENCOUNTER — Other Ambulatory Visit: Payer: Self-pay | Admitting: Student

## 2022-07-25 ENCOUNTER — Other Ambulatory Visit (INDEPENDENT_AMBULATORY_CARE_PROVIDER_SITE_OTHER): Payer: Commercial Managed Care - PPO

## 2022-07-25 VITALS — BP 146/90 | HR 85 | Temp 98.6°F | Ht 63.0 in | Wt 231.0 lb

## 2022-07-25 DIAGNOSIS — J45901 Unspecified asthma with (acute) exacerbation: Secondary | ICD-10-CM

## 2022-07-25 LAB — CBC WITH DIFFERENTIAL/PLATELET
Basophils Absolute: 0.1 10*3/uL (ref 0.0–0.1)
Basophils Relative: 1.2 % (ref 0.0–3.0)
Eosinophils Absolute: 0.3 10*3/uL (ref 0.0–0.7)
Eosinophils Relative: 5.1 % — ABNORMAL HIGH (ref 0.0–5.0)
HCT: 35.8 % — ABNORMAL LOW (ref 36.0–46.0)
Hemoglobin: 11.7 g/dL — ABNORMAL LOW (ref 12.0–15.0)
Lymphocytes Relative: 33.8 % (ref 12.0–46.0)
Lymphs Abs: 1.9 10*3/uL (ref 0.7–4.0)
MCHC: 32.8 g/dL (ref 30.0–36.0)
MCV: 80.6 fl (ref 78.0–100.0)
Monocytes Absolute: 0.6 10*3/uL (ref 0.1–1.0)
Monocytes Relative: 10.3 % (ref 3.0–12.0)
Neutro Abs: 2.8 10*3/uL (ref 1.4–7.7)
Neutrophils Relative %: 49.6 % (ref 43.0–77.0)
Platelets: 311 10*3/uL (ref 150.0–400.0)
RBC: 4.44 Mil/uL (ref 3.87–5.11)
RDW: 14.4 % (ref 11.5–15.5)
WBC: 5.7 10*3/uL (ref 4.0–10.5)

## 2022-07-25 MED ORDER — METHYLPREDNISOLONE ACETATE 80 MG/ML IJ SUSP: 80.0000 mg | Freq: Once | INTRAMUSCULAR | Status: DC

## 2022-07-25 MED ORDER — BENZONATATE 200 MG PO CAPS
200.0000 mg | ORAL_CAPSULE | Freq: Three times a day (TID) | ORAL | 1 refills | Status: DC | PRN
  Filled 2022-07-25: qty 30, 10d supply, fill #0

## 2022-07-25 MED ORDER — PREDNISONE 10 MG PO TABS
ORAL_TABLET | ORAL | 0 refills | Status: DC
  Filled 2022-07-25: qty 30, 12d supply, fill #0

## 2022-07-25 MED ORDER — HYDROCOD POLI-CHLORPHE POLI ER 10-8 MG/5ML PO SUER
5.0000 mL | Freq: Two times a day (BID) | ORAL | 0 refills | Status: DC | PRN
  Filled 2022-07-25: qty 115, 12d supply, fill #0

## 2022-07-25 MED ORDER — METHYLPREDNISOLONE ACETATE 80 MG/ML IJ SUSP
80.0000 mg | Freq: Once | INTRAMUSCULAR | Status: AC
Start: 2022-07-25 — End: 2022-07-25
  Administered 2022-07-25: 80 mg via INTRAMUSCULAR

## 2022-07-25 NOTE — Addendum Note (Signed)
Addended by: Alvin Critchley on: 07/25/2022 11:16 AM   Modules accepted: Orders

## 2022-07-25 NOTE — Addendum Note (Signed)
Addended by: Rosana Berger on: 07/25/2022 09:51 AM   Modules accepted: Orders

## 2022-07-25 NOTE — Addendum Note (Signed)
Addended by: Maryjane Hurter on: 07/25/2022 09:50 AM   Modules accepted: Orders

## 2022-07-25 NOTE — Patient Instructions (Addendum)
-   labs at [facility] - swab for viruses today - IM medrol and prednisone taper - dupixent process started - continue symbicort, singulair, nexium
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
## 2022-07-26 ENCOUNTER — Telehealth: Payer: Self-pay | Admitting: Pharmacist

## 2022-07-26 LAB — IGE: IgE (Immunoglobulin E), Serum: 143 kU/L — ABNORMAL HIGH (ref <=114)

## 2022-07-26 NOTE — Telephone Encounter (Signed)
Submitted a Prior Authorization request to [facility] for DUPIXENT via CoverMyMeds. Will update once we receive a response.  Key: [id number]  Knox Saliva, PharmD, MPH, BCPS, CPP Clinical Pharmacist (Rheumatology and Pulmonology)

## 2022-07-26 NOTE — Telephone Encounter (Signed)
Received new start paperwork for [place]. Patient was supposed to start on [place] in 2022 but she had ended up deferring   Please start Dupixent BIV  Dose: 600mg  at Day 0 then 300mg  SQ every 14 days thereafter  Knox Saliva, PharmD, MPH, BCPS, CPP Clinical Pharmacist (Rheumatology and Pulmonology)

## 2022-07-27 LAB — COVID-19, FLU A+B AND RSV
Influenza A, NAA: NOT DETECTED
Influenza B, NAA: NOT DETECTED
RSV, NAA: NOT DETECTED
SARS-CoV-2, NAA: NOT DETECTED

## 2022-07-27 LAB — SPECIMEN STATUS REPORT

## 2022-07-27 NOTE — Telephone Encounter (Signed)
PA still pending.  

## 2022-07-30 ENCOUNTER — Other Ambulatory Visit (HOSPITAL_COMMUNITY): Payer: Self-pay

## 2022-08-01 ENCOUNTER — Other Ambulatory Visit (HOSPITAL_COMMUNITY): Payer: Self-pay

## 2022-08-01 NOTE — Telephone Encounter (Signed)
Received notification from [facility] regarding a prior authorization for DUPIXENT. Authorization has been APPROVED from 07/30/22 to 11/20/2022. Approval letter sent to scan center.   Unable to run test claim for loading or maintenance.   Patient must fill through [facility]: [phone]   Authorization # [id number] (for 8 mL per 28 days) is active from 07/30/22 through 08/29/22. Maintenance dose is approved for 39mL per 28 days (#79892) from 08/22/22 through 11/20/22  Phone # [phone]  Had to call [employer] to inquire why claim is not processing. Per rep, the loading dose was entered for 200mg  pen and not 300mg  pen even though key on CMM was submitted correctly and approval letter clearly states Berkley Harvey is for 300mg . Had to reinitiate PA over the phone. Turnaround time is 24-72 hours.  Request # [id number]  Chesley Mires, PharmD, MPH, BCPS, CPP Clinical Pharmacist (Rheumatology and Pulmonology)

## 2022-08-06 ENCOUNTER — Other Ambulatory Visit (HOSPITAL_COMMUNITY): Payer: Self-pay

## 2022-08-06 NOTE — Telephone Encounter (Signed)
Received updated authorization approval from [employer] for patient's Dupixent. Authorization was ended for PFS instead of PEN. Claim will only go through for PFS  Copay for 28 day supply is $1000 (for loading dose)  Loading dose is approved from 08/03/22 - 08/31/2022 (#[id number]) Maintenance dose is approved from 09/07/22 - 12/06/22 (#[id number])  I called insurance yet again. Rep also uable to get test claim to go through. Rep is sending an email to get PA addended again.  Phone # [phone]  Chesley Mires, PharmD, MPH, BCPS, CPP Clinical Pharmacist (Rheumatology and Pulmonology)

## 2022-08-09 ENCOUNTER — Other Ambulatory Visit (HOSPITAL_COMMUNITY): Payer: Self-pay

## 2022-08-09 ENCOUNTER — Encounter (INDEPENDENT_AMBULATORY_CARE_PROVIDER_SITE_OTHER): Payer: Self-pay | Admitting: Family Medicine

## 2022-08-09 ENCOUNTER — Ambulatory Visit (INDEPENDENT_AMBULATORY_CARE_PROVIDER_SITE_OTHER): Payer: Commercial Managed Care - PPO | Admitting: Family Medicine

## 2022-08-09 VITALS — BP 160/97 | HR 72 | Temp 98.4°F | Ht 63.0 in | Wt 237.0 lb

## 2022-08-09 DIAGNOSIS — N1831 Chronic kidney disease, stage 3a: Secondary | ICD-10-CM

## 2022-08-09 DIAGNOSIS — Z6841 Body Mass Index (BMI) 40.0 and over, adult: Secondary | ICD-10-CM | POA: Diagnosis not present

## 2022-08-09 DIAGNOSIS — E559 Vitamin D deficiency, unspecified: Secondary | ICD-10-CM | POA: Diagnosis not present

## 2022-08-09 DIAGNOSIS — E538 Deficiency of other specified B group vitamins: Secondary | ICD-10-CM | POA: Diagnosis not present

## 2022-08-09 DIAGNOSIS — I129 Hypertensive chronic kidney disease with stage 1 through stage 4 chronic kidney disease, or unspecified chronic kidney disease: Secondary | ICD-10-CM | POA: Diagnosis not present

## 2022-08-09 DIAGNOSIS — I10 Essential (primary) hypertension: Secondary | ICD-10-CM

## 2022-08-09 DIAGNOSIS — E1122 Type 2 diabetes mellitus with diabetic chronic kidney disease: Secondary | ICD-10-CM

## 2022-08-09 DIAGNOSIS — E119 Type 2 diabetes mellitus without complications: Secondary | ICD-10-CM | POA: Diagnosis not present

## 2022-08-09 DIAGNOSIS — Z7985 Long-term (current) use of injectable non-insulin antidiabetic drugs: Secondary | ICD-10-CM

## 2022-08-09 MED ORDER — VITAMIN D (ERGOCALCIFEROL) 1.25 MG (50000 UNIT) PO CAPS
50000.0000 [IU] | ORAL_CAPSULE | ORAL | 0 refills | Status: DC
Start: 2022-08-09 — End: 2022-09-13
  Filled 2022-08-09: qty 5, 35d supply, fill #0

## 2022-08-09 MED ORDER — SEMAGLUTIDE (1 MG/DOSE) 4 MG/3ML ~~LOC~~ SOPN
1.0000 mg | PEN_INJECTOR | SUBCUTANEOUS | 0 refills | Status: DC
Start: 2022-08-09 — End: 2022-09-13
  Filled 2022-08-09: qty 3, 28d supply, fill #0

## 2022-08-09 NOTE — Assessment & Plan Note (Addendum)
Lab Results  Component Value Date   HGBA1C 5.9 (H) 04/03/2022   Doing well on Ozempic 1 mg once weekly injection with improved satiety.  She denies adverse side effects.  She has been consuming more sugar lately after a round of prednisone for an asthma flare.  She plans added more regular exercise.  Plan: Continue Ozempic 1 mg once weekly injection.  Check CMP and A1c today.  Continue working on a low sugar/low carbohydrate diet.  Plan to increase exercise as asthma improves.

## 2022-08-09 NOTE — Assessment & Plan Note (Signed)
Last vitamin D Lab Results  Component Value Date   VD25OH 21.7 (L) 12/12/2021   She has been taking prescription vitamin D 50,00 IU once weekly.  Her energy level is starting to improve.  She is due for recheck of vitamin D level today.

## 2022-08-09 NOTE — Assessment & Plan Note (Signed)
Blood pressure continues to run high.  She reports good compliance with taking diltiazem CD1 180 mg once daily and losartan/HCTZ 50/12.5 mg once daily.  She denies any increased leg edema, chest pain or headaches.  Plan: Continue current medications with follow-up by primary care.  Recommend tracking home blood pressure readings and bring in log next visit.

## 2022-08-09 NOTE — Assessment & Plan Note (Signed)
A referral to [facility] was placed last visit but she has not yet set this up.  Her last estimated GFR fill into the class IIIa category.  She has a family history for kidney disease and risk factors of hypertension and type 2 diabetes.

## 2022-08-09 NOTE — Assessment & Plan Note (Signed)
She has been taking vitamin B12 1000 mcg once daily.  She denies paresthesias.  Her energy level is starting to improve.  Recheck B12 level today

## 2022-08-09 NOTE — Progress Notes (Signed)
Office: [phone]  /  Fax: [phone]  WEIGHT SUMMARY AND BIOMETRICS  Starting Date: 12/12/21  Starting Weight: 230lb   Weight Lost Since Last Visit: 0   Vitals Temp: 98.4 F (36.9 C) BP: (!) 160/97 Pulse Rate: 72 SpO2: 100 %   Body Composition  Body Fat %: 48.2 % Fat Mass (lbs): 114.4 lbs Muscle Mass (lbs): 116.8 lbs Total Body Water (lbs): 92.4 lbs Visceral Fat Rating : 15     HPI  Chief Complaint: OBESITY  [name] is here to discuss her progress with her obesity treatment plan. She is on the the Category 3 Plan and states she is following her eating plan approximately 40 % of the time. She states she is exercising 0 minutes 0 times per week.   Interval History:  Since last office visit she is is up 3 lb She had a flare of asthma needing prednisone and was wanting soda and crackers She is no longer craving sweets Work stress is better She would like to add in outdoor walking and use of a stair stepper that she just bought She has a good support system at home   Pharmacotherapy: Ozempic 1 mg once weekly injection  PHYSICAL EXAM:  Blood pressure (!) 160/97, pulse 72, temperature 98.4 F (36.9 C), height  (1.6 m), weight 237 lb (107.5 kg), SpO2 100 %. Body mass index is 41.98 kg/m.  General: She is overweight, cooperative, alert, well developed, and in no acute distress. PSYCH: Has normal mood, affect and thought process.   Lungs: Normal breathing effort, no conversational dyspnea.   ASSESSMENT AND PLAN  TREATMENT PLAN FOR OBESITY:  Recommended Dietary Goals  [name] is currently in the action stage of change. As such, her goal is to continue weight management plan. She has agreed to keeping a food journal and adhering to recommended goals of 1200 calories and 85 g of protein.  Behavioral Intervention  We discussed the following Behavioral Modification Strategies today: increasing lean protein intake, decreasing simple carbohydrates ,  increasing vegetables, increasing lower glycemic fruits, increasing fiber rich foods, avoiding skipping meals, increasing water intake, work on managing stress, creating time for self-care and relaxation measures, avoiding temptations and identifying enticing environmental cues, continue to practice mindfulness when eating, and planning for success.  Additional resources provided today: NA  Recommended Physical Activity Goals  [name] has been advised to work up to 150 minutes of moderate intensity aerobic activity a week and strengthening exercises 2-3 times per week for cardiovascular health, weight loss maintenance and preservation of muscle mass.   She has agreed to Think about ways to increase physical activity  Pharmacotherapy changes for the treatment of obesity:   ASSOCIATED CONDITIONS ADDRESSED TODAY  Type 2 diabetes mellitus without complication, without long-term current use of insulin Assessment & Plan: Lab Results  Component Value Date   HGBA1C 5.9 (H) 04/03/2022   Doing well on Ozempic 1 mg once weekly injection with improved satiety.  She denies adverse side effects.  She has been consuming more sugar lately after a round of prednisone for an asthma flare.  She plans added more regular exercise.  Plan: Continue Ozempic 1 mg once weekly injection.  Check CMP and A1c today.  Continue working on a low sugar/low carbohydrate diet.  Plan to increase exercise as asthma improves.  Orders: -     Semaglutide (1 MG/DOSE); Inject 1 mg as directed once a week.  Dispense: 9 mL; Refill: 0 -     Hemoglobin A1c -  Comprehensive metabolic panel  Morbid obesity Assessment & Plan: Patient has had a fairly stable weight since starting here in August 2023 but had lost 40 pounds in 2022 with medically supervised weight management.  She is happy to see inches lost and is still mindful of her food choices, portion sizes and is still working on eating on a schedule.  We discussed cutting out  sugar sweetened beverages and finding some indoor exercises given her limitations of allergies and asthma.  Recommend tracking daily caloric intake using the my fitness pal app with a target goal 1200 kcal/day which should include 85 g of protein daily.   BMI 40.0-44.9, adult  Stage 3a chronic kidney disease Assessment & Plan: A referral to [facility] was placed last visit but she has not yet set this up.  Her last estimated GFR fill into the class IIIa category.  She has a family history for kidney disease and risk factors of hypertension and type 2 diabetes.   Essential hypertension Assessment & Plan: Blood pressure continues to run high.  She reports good compliance with taking diltiazem CD1 180 mg once daily and losartan/HCTZ 50/12.5 mg once daily.  She denies any increased leg edema, chest pain or headaches.  Plan: Continue current medications with follow-up by primary care.  Recommend tracking home blood pressure readings and bring in log next visit.   Vitamin D deficiency Assessment & Plan: Last vitamin D Lab Results  Component Value Date   VD25OH 21.7 (L) 12/12/2021   She has been taking prescription vitamin D 50,00 IU once weekly.  Her energy level is starting to improve.  She is due for recheck of vitamin D level today.  Orders: -     VITAMIN D 25 Hydroxy (Vit-D Deficiency, Fractures) -     Vitamin D (Ergocalciferol); Take 1 capsule by mouth every 7 days.  Dispense: 5 capsule; Refill: 0  Vitamin B12 deficiency Assessment & Plan: She has been taking vitamin B12 1000 mcg once daily.  She denies paresthesias.  Her energy level is starting to improve.  Recheck B12 level today  Orders: -     Vitamin B12      She was informed of the importance of frequent follow up visits to maximize her success with intensive lifestyle modifications for her multiple health conditions.   ATTESTASTION STATEMENTS:  Reviewed by clinician on day of visit: allergies, medications,  problem list, medical history, surgical history, family history, social history, and previous encounter notes pertinent to obesity diagnosis.   I have personally spent 30 minutes total time today in preparation, patient care, nutritional counseling and documentation for this visit, including the following: review of clinical lab tests; review of medical tests/procedures/services.      [name], DO DABFM, DABOM [facility] [address] [place], [place] [address] [phone]

## 2022-08-09 NOTE — Assessment & Plan Note (Signed)
Patient has had a fairly stable weight since starting here in August 2023 but had lost 40 pounds in 2022 with medically supervised weight management.  She is happy to see inches lost and is still mindful of her food choices, portion sizes and is still working on eating on a schedule.  We discussed cutting out sugar sweetened beverages and finding some indoor exercises given her limitations of allergies and asthma.  Recommend tracking daily caloric intake using the my fitness pal app with a target goal 1200 kcal/day which should include 85 g of protein daily.

## 2022-08-10 LAB — COMPREHENSIVE METABOLIC PANEL
ALT: 15 IU/L (ref 0–32)
AST: 18 IU/L (ref 0–40)
Albumin/Globulin Ratio: 2.1 (ref 1.2–2.2)
Albumin: 4.6 g/dL (ref 3.8–4.9)
Alkaline Phosphatase: 102 IU/L (ref 44–121)
BUN/Creatinine Ratio: 18 (ref 9–23)
BUN: 22 mg/dL (ref 6–24)
Bilirubin Total: 0.3 mg/dL (ref 0.0–1.2)
CO2: 22 mmol/L (ref 20–29)
Calcium: 9.7 mg/dL (ref 8.7–10.2)
Chloride: 101 mmol/L (ref 96–106)
Creatinine, Ser: 1.23 mg/dL — ABNORMAL HIGH (ref 0.57–1.00)
Globulin, Total: 2.2 g/dL (ref 1.5–4.5)
Glucose: 78 mg/dL (ref 70–99)
Potassium: 4.1 mmol/L (ref 3.5–5.2)
Sodium: 139 mmol/L (ref 134–144)
Total Protein: 6.8 g/dL (ref 6.0–8.5)
eGFR: 52 mL/min/{1.73_m2} — ABNORMAL LOW (ref >59)

## 2022-08-10 LAB — HEMOGLOBIN A1C
Est. average glucose Bld gHb Est-mCnc: 126 mg/dL
Hgb A1c MFr Bld: 6 % — ABNORMAL HIGH (ref 4.8–5.6)

## 2022-08-10 LAB — VITAMIN B12: Vitamin B-12: 96 pg/mL — ABNORMAL LOW (ref 232–1245)

## 2022-08-10 LAB — VITAMIN D 25 HYDROXY (VIT D DEFICIENCY, FRACTURES): Vit D, 25-Hydroxy: 23.4 ng/mL — ABNORMAL LOW (ref 30.0–100.0)

## 2022-08-13 ENCOUNTER — Telehealth (INDEPENDENT_AMBULATORY_CARE_PROVIDER_SITE_OTHER): Payer: Self-pay | Admitting: *Deleted

## 2022-08-13 ENCOUNTER — Other Ambulatory Visit (HOSPITAL_COMMUNITY): Payer: Self-pay

## 2022-08-13 NOTE — Telephone Encounter (Signed)
Left another voicemail at [facility] [phone] to try to get patient an appointment per Dr. Cathey Endow.

## 2022-08-16 ENCOUNTER — Other Ambulatory Visit (HOSPITAL_COMMUNITY): Payer: Self-pay

## 2022-08-17 ENCOUNTER — Other Ambulatory Visit (HOSPITAL_COMMUNITY): Payer: Self-pay

## 2022-08-17 NOTE — Telephone Encounter (Signed)
Claim for Dupixent 300mg  pens finally processed. Copay is $1000 for loading dose. Will need to enroll patient into Dupixent copay card to help with copay. Loading dose authorization should be active through 08/31/2022  Chesley Mires, PharmD, MPH, BCPS, CPP Clinical Pharmacist (Rheumatology and Pulmonology)

## 2022-08-21 NOTE — Telephone Encounter (Signed)
Dupixent copay card per previous BIV RxBIN: [id number] RxPCN: [id number] RxGRP: [id number] ID: [id number]  ATC patient to schedule Dupixent new start. Unable to reach and unable to leave VM because VM box is full. MyChart message sent to pt  Chesley Mires, PharmD, MPH, BCPS, CPP Clinical Pharmacist (Rheumatology and Pulmonology)

## 2022-08-24 NOTE — Telephone Encounter (Signed)
ATC patient to schedule Dupixent new start. Unable to reach and again unable to leave VM due to VM box being full. Will ATC one more time  Chesley Mires, PharmD, MPH, BCPS, CPP Clinical Pharmacist (Rheumatology and Pulmonology)

## 2022-08-29 NOTE — Telephone Encounter (Signed)
ATC patient to schedule Dupixent new start. Unable to reach and unable to leave VM as VM box is still full  Letter has been sent to patient today. If no response by 09/12/22, will close encounter  Chesley Mires, PharmD, MPH, BCPS, CPP Clinical Pharmacist (Rheumatology and Pulmonology)

## 2022-09-01 ENCOUNTER — Other Ambulatory Visit: Payer: Self-pay

## 2022-09-01 ENCOUNTER — Other Ambulatory Visit (HOSPITAL_COMMUNITY): Payer: Self-pay

## 2022-09-07 ENCOUNTER — Other Ambulatory Visit (HOSPITAL_COMMUNITY): Payer: Self-pay

## 2022-09-12 NOTE — Telephone Encounter (Addendum)
Closing encounter as patient has not followed up with pharmacy team regarding Dupixent new start. Routing back to Dr. Thora Lance as [name]

## 2022-09-13 ENCOUNTER — Telehealth (INDEPENDENT_AMBULATORY_CARE_PROVIDER_SITE_OTHER): Payer: Self-pay | Admitting: *Deleted

## 2022-09-13 ENCOUNTER — Other Ambulatory Visit (HOSPITAL_COMMUNITY): Payer: Self-pay

## 2022-09-13 ENCOUNTER — Encounter (INDEPENDENT_AMBULATORY_CARE_PROVIDER_SITE_OTHER): Payer: Self-pay | Admitting: Family Medicine

## 2022-09-13 ENCOUNTER — Ambulatory Visit (INDEPENDENT_AMBULATORY_CARE_PROVIDER_SITE_OTHER): Payer: Commercial Managed Care - PPO | Admitting: Family Medicine

## 2022-09-13 VITALS — BP 171/98 | HR 74 | Temp 98.7°F | Ht 63.0 in | Wt 238.0 lb

## 2022-09-13 DIAGNOSIS — N1831 Chronic kidney disease, stage 3a: Secondary | ICD-10-CM | POA: Diagnosis not present

## 2022-09-13 DIAGNOSIS — E538 Deficiency of other specified B group vitamins: Secondary | ICD-10-CM

## 2022-09-13 DIAGNOSIS — Z6841 Body Mass Index (BMI) 40.0 and over, adult: Secondary | ICD-10-CM

## 2022-09-13 DIAGNOSIS — I10 Essential (primary) hypertension: Secondary | ICD-10-CM

## 2022-09-13 DIAGNOSIS — Z7985 Long-term (current) use of injectable non-insulin antidiabetic drugs: Secondary | ICD-10-CM | POA: Diagnosis not present

## 2022-09-13 DIAGNOSIS — E119 Type 2 diabetes mellitus without complications: Secondary | ICD-10-CM

## 2022-09-13 DIAGNOSIS — E559 Vitamin D deficiency, unspecified: Secondary | ICD-10-CM | POA: Diagnosis not present

## 2022-09-13 MED ORDER — INSULIN SYRINGE-NEEDLE U-100 30G X 1/2" 1 ML MISC
0 refills | Status: DC
Start: 2022-09-13 — End: 2023-06-21
  Filled 2022-09-13: qty 90, 90d supply, fill #0

## 2022-09-13 MED ORDER — CYANOCOBALAMIN 1000 MCG/ML IJ SOLN
1000.0000 ug | INTRAMUSCULAR | 0 refills | Status: DC
Start: 2022-09-13 — End: 2022-10-16
  Filled 2022-09-13: qty 10, 70d supply, fill #0

## 2022-09-13 MED ORDER — VITAMIN D (ERGOCALCIFEROL) 1.25 MG (50000 UNIT) PO CAPS
50000.0000 [IU] | ORAL_CAPSULE | ORAL | 0 refills | Status: DC
Start: 2022-09-13 — End: 2022-10-16
  Filled 2022-09-13: qty 5, 35d supply, fill #0

## 2022-09-13 MED ORDER — OZEMPIC (2 MG/DOSE) 8 MG/3ML ~~LOC~~ SOPN
PEN_INJECTOR | SUBCUTANEOUS | 0 refills | Status: DC
Start: 2022-09-13 — End: 2022-10-03
  Filled 2022-09-13: qty 3, 28d supply, fill #0

## 2022-09-13 NOTE — Telephone Encounter (Signed)
Phone call to [facility] to try to get a referral for patient per Dr.Bowen I have left multiple messages for the referral coordinator at that office. Phone number for [facility] given to patient today for her to try and call to get appointment. [phone]

## 2022-09-13 NOTE — Assessment & Plan Note (Signed)
Lab Results  Component Value Date   HGBA1C 6.0 (H) 08/09/2022   Reviewed labs from last visit.  Her A1c is stable at 6.0.  She is actively working on weight reduction, reducing added sugar and refined carbohydrates.  She has been fairly consistent with regular exercise including both cardio and resistance training.  She is doing well on Ozempic 1 mg once weekly injection but her weight loss has plateaued.  She denies GI side effects.  Continue prescribed dietary plan.  Increase Ozempic to 2 mg once weekly injection

## 2022-09-13 NOTE — Assessment & Plan Note (Signed)
Lab Results  Component Value Date   CREATININE 1.23 (H) 08/09/2022   CREATININE 1.31 (H) 04/03/2022   CREATININE 1.25 (H) 12/12/2021    Reviewed lab from last visit.  Her GFR is stable at 52.  She has underlying hypertension and type 2 diabetes.  A referral to nephrology has been placed but she has not yet been scheduled.  A fax was sent in to [facility] today.  Keep upcoming visit with [facility].  Avoid nephrotoxic medications and hydrate well with water

## 2022-09-13 NOTE — Progress Notes (Signed)
Office: [phone]  /  Fax: [phone]  WEIGHT SUMMARY AND BIOMETRICS  Starting Date: 12/12/21  Starting Weight: 230lb   Weight Lost Since Last Visit: 0   Vitals Temp: 98.7 F (37.1 C) BP: (!) 171/98 Pulse Rate: 74 SpO2: 98 %   Body Composition  Body Fat %: 48.6 % Fat Mass (lbs): 115.8 lbs Muscle Mass (lbs): 116.2 lbs Total Body Water (lbs): 90.4 lbs Visceral Fat Rating : 16     HPI  Chief Complaint: OBESITY  [name] is here to discuss her progress with her obesity treatment plan. She is on the the Category 3 Plan and states she is following her eating plan approximately 60 % of the time. She states she is exercising 60 minutes 3 times per week.   Interval History:  Since last office visit she is up 1 lb She has a net weight gain of 8 pounds in the past 9 months She has lost about 40 pounds in the past 2 years She has been checking hr Bps at work and she is running 120s/ 80s at work She is taking all of her meds as directed Ozempic 1 mg is helping with appetite with occasional slight nausea She is eating most of the food on her category 3 meal plan High stress levels at work remains a barrier to her progress She is working out with a Psychologist, educational 3 x a week doing weight training Walking outdoors 1 hr 4 x a week  Pharmacotherapy: Ozempic 1 mg weekly  PHYSICAL EXAM:  Blood pressure (!) 171/98, pulse 74, temperature 98.7 F (37.1 C), height 5\' 3"  (1.6 m), weight 238 lb (108 kg), SpO2 98 %. Body mass index is 42.16 kg/m.  General: She is overweight, cooperative, alert, well developed, and in no acute distress. PSYCH: Has normal mood, affect and thought process.   Lungs: Normal breathing effort, no conversational dyspnea.   ASSESSMENT AND PLAN  TREATMENT PLAN FOR OBESITY:  Recommended Dietary Goals  [name] is currently in the action stage of change. As such, her goal is to continue weight management plan. She has agreed to the Category 3  Plan.  Behavioral Intervention  We discussed the following Behavioral Modification Strategies today: increasing lean protein intake, decreasing simple carbohydrates , increasing vegetables, increasing lower glycemic fruits, increasing water intake, keeping healthy foods at home, work on managing stress, creating time for self-care and relaxation measures, continue to practice mindfulness when eating, and planning for success.  Additional resources provided today: NA  Recommended Physical Activity Goals  [name] has been advised to work up to 150 minutes of moderate intensity aerobic activity a week and strengthening exercises 2-3 times per week for cardiovascular health, weight loss maintenance and preservation of muscle mass.   She has agreed to Increase the intensity, frequency or duration of strengthening exercises   Pharmacotherapy changes for the treatment of obesity: Ozempic increased to 2 mg once weekly injection  ASSOCIATED CONDITIONS ADDRESSED TODAY  Vitamin D deficiency Assessment & Plan: Last vitamin D Lab Results  Component Value Date   VD25OH 23.4 (L) 08/09/2022   She is doing well on vitamin D 50,000 IU once weekly.  Her level has not improved over the past few months.  She does complain of fatigue.  Reviewed labs from last visit.  Continue prescription vitamin D 50,000 IU once weekly.  Add on over-the-counter vitamin D 2000 IU once daily in addition  Recheck level in 3 to 4 months  Orders: -  Vitamin D (Ergocalciferol); Take 1 capsule by mouth every 7 days.  Dispense: 5 capsule; Refill: 0  Type 2 diabetes mellitus without complication, without long-term current use of insulin ([name]) Assessment & Plan: Lab Results  Component Value Date   HGBA1C 6.0 (H) 08/09/2022   Reviewed labs from last visit.  Her A1c is stable at 6.0.  She is actively working on weight reduction, reducing added sugar and refined carbohydrates.  She has been fairly consistent with regular  exercise including both cardio and resistance training.  She is doing well on Ozempic 1 mg once weekly injection but her weight loss has plateaued.  She denies GI side effects.  Continue prescribed dietary plan.  Increase Ozempic to 2 mg once weekly injection  Orders: -     Ozempic (2 MG/DOSE); Inject 8 mg (3ml) into the skin once weekly.  Dispense: 3 mL; Refill: 0  Morbid obesity ([name]) with starting BMI 40  BMI 40.0-44.9, adult ([name])  Stage 3a chronic kidney disease ([name]) Assessment & Plan: Lab Results  Component Value Date   CREATININE 1.23 (H) 08/09/2022   CREATININE 1.31 (H) 04/03/2022   CREATININE 1.25 (H) 12/12/2021    Reviewed lab from last visit.  Her GFR is stable at 52.  She has underlying hypertension and type 2 diabetes.  A referral to nephrology has been placed but she has not yet been scheduled.  A fax was sent in to [facility] today.  Keep upcoming visit with [facility].  Avoid nephrotoxic medications and hydrate well with water   Essential hypertension Assessment & Plan: Blood pressure remains elevated.  She reports good compliance taking Cardizem CD1 180 mg once daily and HCTZ 25 mg daily and losartan/HCTZ 50/12.5 mg daily.  She has been referred to nephrology for CKD and uncontrolled hypertension but has not yet scheduled her visit.  She denies chest pain or headache.  She has been monitoring her blood pressure while at work and reports blood pressure readings of 120s over 80s.  Continue current medication and keep upcoming visit as scheduled with nephrology.   B12 deficiency Assessment & Plan: Worsening.  Reviewed labs from last visit.  Her B12 was very low at 96 even on oral B12 1000 mcg daily but she does report poor compliance and taking oral B12.  Her B12 has previously been very low.  She denies paresthesias or brain fog.  She denies a history of neuropathy.  Though she is resistant to B12 injections, will begin B12 injections 1000 mcg weekly for 6  weeks then recheck level.  Consider a referral to hematology to rule out pernicious anemia  Orders: -     Cyanocobalamin; Inject 1 mL (1,000 mcg total) into the muscle once a week.  Dispense: 10 mL; Refill: 0 -     Insulin Syringe-Needle U-100; Use once weekly as directed  Dispense: 90 each; Refill: 0      She was informed of the importance of frequent follow up visits to maximize her success with intensive lifestyle modifications for her multiple health conditions.   ATTESTASTION STATEMENTS:  Reviewed by clinician on day of visit: allergies, medications, problem list, medical history, surgical history, family history, social history, and previous encounter notes pertinent to obesity diagnosis.   I have personally spent 30 minutes total time today in preparation, patient care, nutritional counseling and documentation for this visit, including the following: review of clinical lab tests; review of medical tests/procedures/services.      [name], DO DABFM,  DABOM [facility] [address] [place], [place] [address] [phone]

## 2022-09-13 NOTE — Assessment & Plan Note (Signed)
Worsening.  Reviewed labs from last visit.  Her B12 was very low at 96 even on oral B12 1000 mcg daily but she does report poor compliance and taking oral B12.  Her B12 has previously been very low.  She denies paresthesias or brain fog.  She denies a history of neuropathy.  Though she is resistant to B12 injections, will begin B12 injections 1000 mcg weekly for 6 weeks then recheck level.  Consider a referral to hematology to rule out pernicious anemia

## 2022-09-13 NOTE — Assessment & Plan Note (Signed)
Blood pressure remains elevated.  She reports good compliance taking Cardizem CD1 180 mg once daily and HCTZ 25 mg daily and losartan/HCTZ 50/12.5 mg daily.  She has been referred to nephrology for CKD and uncontrolled hypertension but has not yet scheduled her visit.  She denies chest pain or headache.  She has been monitoring her blood pressure while at work and reports blood pressure readings of 120s over 80s.  Continue current medication and keep upcoming visit as scheduled with nephrology.

## 2022-09-13 NOTE — Telephone Encounter (Signed)
Phone call to [facility], spoke to one of the front staff and was able to get fax number. I sent over patients last 2 office notes and her most recent labs, insurance information and demographics. Patient was also given the phone number for the practice so if they do not call she can call them to check on status of her referral.

## 2022-09-13 NOTE — Assessment & Plan Note (Signed)
Last vitamin D Lab Results  Component Value Date   VD25OH 23.4 (L) 08/09/2022   She is doing well on vitamin D 50,000 IU once weekly.  Her level has not improved over the past few months.  She does complain of fatigue.  Reviewed labs from last visit.  Continue prescription vitamin D 50,000 IU once weekly.  Add on over-the-counter vitamin D 2000 IU once daily in addition  Recheck level in 3 to 4 months

## 2022-09-14 ENCOUNTER — Other Ambulatory Visit (HOSPITAL_COMMUNITY): Payer: Self-pay

## 2022-09-15 ENCOUNTER — Other Ambulatory Visit (HOSPITAL_COMMUNITY): Payer: Self-pay

## 2022-09-27 ENCOUNTER — Other Ambulatory Visit (HOSPITAL_COMMUNITY): Payer: Self-pay

## 2022-09-27 ENCOUNTER — Other Ambulatory Visit (INDEPENDENT_AMBULATORY_CARE_PROVIDER_SITE_OTHER): Payer: Self-pay | Admitting: Family Medicine

## 2022-09-27 DIAGNOSIS — E119 Type 2 diabetes mellitus without complications: Secondary | ICD-10-CM

## 2022-09-28 ENCOUNTER — Other Ambulatory Visit (HOSPITAL_COMMUNITY): Payer: Self-pay

## 2022-09-30 ENCOUNTER — Other Ambulatory Visit (HOSPITAL_COMMUNITY): Payer: Self-pay

## 2022-09-30 ENCOUNTER — Other Ambulatory Visit: Payer: Self-pay | Admitting: Student

## 2022-09-30 ENCOUNTER — Other Ambulatory Visit (INDEPENDENT_AMBULATORY_CARE_PROVIDER_SITE_OTHER): Payer: Self-pay | Admitting: Family Medicine

## 2022-09-30 ENCOUNTER — Other Ambulatory Visit: Payer: Self-pay | Admitting: Internal Medicine

## 2022-09-30 DIAGNOSIS — E538 Deficiency of other specified B group vitamins: Secondary | ICD-10-CM

## 2022-09-30 DIAGNOSIS — E559 Vitamin D deficiency, unspecified: Secondary | ICD-10-CM

## 2022-10-01 ENCOUNTER — Other Ambulatory Visit (HOSPITAL_COMMUNITY): Payer: Self-pay

## 2022-10-01 ENCOUNTER — Other Ambulatory Visit: Payer: Self-pay

## 2022-10-01 MED ORDER — IBUPROFEN 800 MG PO TABS
ORAL_TABLET | ORAL | 1 refills | Status: DC
  Filled 2022-10-01: qty 90, 30d supply, fill #0

## 2022-10-02 ENCOUNTER — Other Ambulatory Visit (HOSPITAL_COMMUNITY): Payer: Self-pay

## 2022-10-02 ENCOUNTER — Telehealth (INDEPENDENT_AMBULATORY_CARE_PROVIDER_SITE_OTHER): Payer: Self-pay | Admitting: Family Medicine

## 2022-10-02 MED ORDER — TRAMADOL HCL 50 MG PO TABS
ORAL_TABLET | ORAL | 0 refills | Status: DC
  Filled 2022-10-02: qty 30, 7d supply, fill #0

## 2022-10-02 NOTE — Telephone Encounter (Signed)
Patient stated the instructions were wrong for Ozempic medication. Please advise.

## 2022-10-03 ENCOUNTER — Other Ambulatory Visit (INDEPENDENT_AMBULATORY_CARE_PROVIDER_SITE_OTHER): Payer: Self-pay | Admitting: Family Medicine

## 2022-10-03 ENCOUNTER — Other Ambulatory Visit (HOSPITAL_COMMUNITY): Payer: Self-pay

## 2022-10-03 DIAGNOSIS — E119 Type 2 diabetes mellitus without complications: Secondary | ICD-10-CM

## 2022-10-03 MED ORDER — OZEMPIC (2 MG/DOSE) 8 MG/3ML ~~LOC~~ SOPN
2.0000 mg | PEN_INJECTOR | SUBCUTANEOUS | 0 refills | Status: DC
Start: 2022-10-03 — End: 2022-10-16
  Filled 2022-10-03: qty 3, 28d supply, fill #0

## 2022-10-03 NOTE — Telephone Encounter (Signed)
Phone call to patient to let her know that Dr.Bowen has fixed her Ozempic prescription and should be able to get it filled now.

## 2022-10-05 ENCOUNTER — Other Ambulatory Visit (HOSPITAL_COMMUNITY): Payer: Self-pay

## 2022-10-05 DIAGNOSIS — Z1211 Encounter for screening for malignant neoplasm of colon: Secondary | ICD-10-CM | POA: Diagnosis not present

## 2022-10-05 DIAGNOSIS — Z1231 Encounter for screening mammogram for malignant neoplasm of breast: Secondary | ICD-10-CM | POA: Diagnosis not present

## 2022-10-05 DIAGNOSIS — R030 Elevated blood-pressure reading, without diagnosis of hypertension: Secondary | ICD-10-CM | POA: Diagnosis not present

## 2022-10-05 DIAGNOSIS — Z01419 Encounter for gynecological examination (general) (routine) without abnormal findings: Secondary | ICD-10-CM | POA: Diagnosis not present

## 2022-10-05 DIAGNOSIS — Z6841 Body Mass Index (BMI) 40.0 and over, adult: Secondary | ICD-10-CM | POA: Diagnosis not present

## 2022-10-05 DIAGNOSIS — N898 Other specified noninflammatory disorders of vagina: Secondary | ICD-10-CM | POA: Diagnosis not present

## 2022-10-05 DIAGNOSIS — Z90711 Acquired absence of uterus with remaining cervical stump: Secondary | ICD-10-CM | POA: Diagnosis not present

## 2022-10-05 DIAGNOSIS — Z139 Encounter for screening, unspecified: Secondary | ICD-10-CM | POA: Diagnosis not present

## 2022-10-05 MED ORDER — TERCONAZOLE 0.4 % VA CREA: 1.0000 | TOPICAL_CREAM | Freq: Every evening | VAGINAL | 2 refills | Status: AC

## 2022-10-05 MED ORDER — ESOMEPRAZOLE MAGNESIUM 20 MG PO CPDR
20.0000 mg | DELAYED_RELEASE_CAPSULE | Freq: Every day | ORAL | 5 refills | Status: DC
  Filled 2022-10-05: qty 30, 30d supply, fill #0

## 2022-10-05 MED ORDER — LORAZEPAM 1 MG PO TABS
1.0000 mg | ORAL_TABLET | Freq: Three times a day (TID) | ORAL | 5 refills | Status: AC | PRN
  Filled 2022-10-05 – 2022-12-13 (×2): qty 30, 10d supply, fill #0
  Filled 2023-03-22: qty 30, 10d supply, fill #1

## 2022-10-05 NOTE — Telephone Encounter (Signed)
Med refilled as requested

## 2022-10-15 ENCOUNTER — Other Ambulatory Visit (HOSPITAL_COMMUNITY): Payer: Self-pay

## 2022-10-16 ENCOUNTER — Encounter (INDEPENDENT_AMBULATORY_CARE_PROVIDER_SITE_OTHER): Payer: Self-pay | Admitting: Family Medicine

## 2022-10-16 ENCOUNTER — Ambulatory Visit (INDEPENDENT_AMBULATORY_CARE_PROVIDER_SITE_OTHER): Payer: Commercial Managed Care - PPO | Admitting: Family Medicine

## 2022-10-16 ENCOUNTER — Other Ambulatory Visit: Payer: Self-pay

## 2022-10-16 ENCOUNTER — Other Ambulatory Visit (HOSPITAL_COMMUNITY): Payer: Self-pay

## 2022-10-16 VITALS — BP 169/98 | HR 65 | Temp 97.9°F | Ht 63.0 in | Wt 240.0 lb

## 2022-10-16 DIAGNOSIS — N1831 Chronic kidney disease, stage 3a: Secondary | ICD-10-CM

## 2022-10-16 DIAGNOSIS — Z7985 Long-term (current) use of injectable non-insulin antidiabetic drugs: Secondary | ICD-10-CM | POA: Diagnosis not present

## 2022-10-16 DIAGNOSIS — E538 Deficiency of other specified B group vitamins: Secondary | ICD-10-CM

## 2022-10-16 DIAGNOSIS — E119 Type 2 diabetes mellitus without complications: Secondary | ICD-10-CM

## 2022-10-16 DIAGNOSIS — Z6841 Body Mass Index (BMI) 40.0 and over, adult: Secondary | ICD-10-CM

## 2022-10-16 DIAGNOSIS — E559 Vitamin D deficiency, unspecified: Secondary | ICD-10-CM

## 2022-10-16 MED ORDER — OZEMPIC (2 MG/DOSE) 8 MG/3ML ~~LOC~~ SOPN
2.0000 mg | PEN_INJECTOR | SUBCUTANEOUS | 0 refills | Status: DC
Start: 2022-10-16 — End: 2023-03-14
  Filled 2022-10-16 – 2022-11-16 (×2): qty 3, 28d supply, fill #0

## 2022-10-16 MED ORDER — VITAMIN D (ERGOCALCIFEROL) 1.25 MG (50000 UNIT) PO CAPS
50000.0000 [IU] | ORAL_CAPSULE | ORAL | 0 refills | Status: DC
Start: 2022-10-16 — End: 2023-03-14
  Filled 2022-10-16 – 2023-01-05 (×2): qty 5, 35d supply, fill #0

## 2022-10-16 MED ORDER — CYANOCOBALAMIN 1000 MCG/ML IJ SOLN
1000.0000 ug | INTRAMUSCULAR | 0 refills | Status: DC
Start: 2022-10-16 — End: 2023-02-11
  Filled 2022-10-16 – 2023-01-05 (×2): qty 10, 70d supply, fill #0

## 2022-10-16 NOTE — Assessment & Plan Note (Signed)
Lab Results  Component Value Date   HGBA1C 6.0 (H) 08/09/2022   She is doing well with dose increase of Ozempic to 2 mg weekly. She is not checking glucose readings Weight has been stable  She has been non adherent with her diet with meal skipping, drinking regular soda, eating candy on a regular basis  We discussed the importance of reading labels on food and drink for sugar.  Avoid products with > 8 g of added sugar per serving.  Avoid skipping meals on Ozempic, adding in a protein drink in place of skipping.  Opt for sugar free drink choices.  Continue Ozempic 2 mg weekly

## 2022-10-16 NOTE — Assessment & Plan Note (Signed)
Doing well on RX vitamin B12 1,000 mcg weekly, started 4 weeks ago Energy level improving Denies brain fog or paresthesias  Recheck B12 level next visit

## 2022-10-16 NOTE — Progress Notes (Signed)
Office: [phone]  /  Fax: [phone]  WEIGHT SUMMARY AND BIOMETRICS  Starting Date: 12/12/21  Starting Weight: 230lb   Weight Lost Since Last Visit: 0   Vitals Temp: 97.9 F (36.6 C) BP: (!) 169/98 Pulse Rate: 65 SpO2: 100 %   Body Composition  Body Fat %: 48.6 % Fat Mass (lbs): 117 lbs Muscle Mass (lbs): 117.4 lbs Total Body Water (lbs): 91 lbs Visceral Fat Rating : 16     HPI  Chief Complaint: OBESITY  [name] is here to discuss her progress with her obesity treatment plan. She is on the the Category 3 Plan and states she is following her eating plan approximately 50 % of the time. She states she is exercising 30-60 minutes 4 times per week.   Interval History:  Since last office visit she is up 2 lb She gained 1.2 lb of muscle mass and gained 1.2 lb of muscle mass She has a net GAIN of 10 lb in the past 10 mos She did go up on Ozempic to 2 mg weekly -- has done 2 injections with some meal skiipping due to lack of appetite She is feeling improved satiety She did start on B12 injections weekly -- due for a level in a month.  Energy level has improved She is on RX vitamin D weekly Dealing with asthma for the past 2 weeks She has been drinking regular ginger ale She has yet to be seen by nephrology She is still eating candy  Pharmacotherapy: Ozempic 2 mg weekly  PHYSICAL EXAM:  Blood pressure (!) 169/98, pulse 65, temperature 97.9 F (36.6 C), height 5\' 3"  (1.6 m), weight 240 lb (108.9 kg), SpO2 100 %. Body mass index is 42.51 kg/m.  General: She is overweight, cooperative, alert, well developed, and in no acute distress. PSYCH: Has normal mood, affect and thought process.   Lungs: Normal breathing effort, no conversational dyspnea.   ASSESSMENT AND PLAN  TREATMENT PLAN FOR OBESITY:  Recommended Dietary Goals  [name] is currently in the action stage of change. As such, her goal is to continue weight management plan. She has agreed to the  Category 3 Plan. - avoid foods and drinks with over 8 g of sugar per serving - do not skip meals - can do 1 UP protein powder + water or vitamin 20, greek yogurt, protein shake in place of skipping meaing - limit JUICE and avoid CANDY  Behavioral Intervention  We discussed the following Behavioral Modification Strategies today: increasing lean protein intake, decreasing simple carbohydrates , increasing vegetables, increasing lower glycemic fruits, increasing water intake, reading food labels , avoiding temptations and identifying enticing environmental cues, continue to practice mindfulness when eating, and planning for success.  Additional resources provided today: NA  Recommended Physical Activity Goals  Evan has been advised to work up to 150 minutes of moderate intensity aerobic activity a week and strengthening exercises 2-3 times per week for cardiovascular health, weight loss maintenance and preservation of muscle mass.   She has agreed to [employer] strengthening exercises with a goal of 2-3 sessions a week   Pharmacotherapy changes for the treatment of obesity: Ozempic 2 mg weekly  ASSOCIATED CONDITIONS ADDRESSED TODAY  Type 2 diabetes mellitus without complication, without long-term current use of insulin ([name]) Assessment & Plan: Lab Results  Component Value Date   HGBA1C 6.0 (H) 08/09/2022   She is doing well with dose increase of Ozempic to 2 mg weekly. She is not checking glucose readings Weight has  been stable  She has been non adherent with her diet with meal skipping, drinking regular soda, eating candy on a regular basis  We discussed the importance of reading labels on food and drink for sugar.  Avoid products with > 8 g of added sugar per serving.  Avoid skipping meals on Ozempic, adding in a protein drink in place of skipping.  Opt for sugar free drink choices.  Continue Ozempic 2 mg weekly  Orders: -     Ozempic (2 MG/DOSE); Inject 2 mg into the skin once a week  as directed  Dispense: 3 mL; Refill: 0  Morbid obesity ([name]) with starting BMI 40  BMI 40.0-44.9, adult ([name])  Vitamin D deficiency Assessment & Plan: Last vitamin D Lab Results  Component Value Date   VD25OH 23.4 (L) 08/09/2022   Doing well on RX vitamin D weekly.  Energy level starting to improve.  Denies adverse SE  Recheck level next visit  Orders: -     Vitamin D (Ergocalciferol); Take 1 capsule by mouth every 7 days.  Dispense: 5 capsule; Refill: 0  Stage 3a chronic kidney disease ([name]) Assessment & Plan: Her BP continues to run high here but has been normal at other doctor's visits.  She agrees to check BP at work in the next week and send me 3 readings of resting BP.  She has been referred to [facility] for CKD stage 3 a but has not yet been scheduled.  Asked pt to call their office and set up visit.   Vitamin B12 deficiency Assessment & Plan: Doing well on RX vitamin B12    B12 deficiency Assessment & Plan: Doing well on RX vitamin B12 1,000 mcg weekly, started 4 weeks ago Energy level improving Denies brain fog or paresthesias  Recheck B12 level next visit  Orders: -     Cyanocobalamin; Inject 1 mL (1,000 mcg total) into the muscle once a week.  Dispense: 10 mL; Refill: 0      She was informed of the importance of frequent follow up visits to maximize her success with intensive lifestyle modifications for her multiple health conditions.   ATTESTASTION STATEMENTS:  Reviewed by clinician on day of visit: allergies, medications, problem list, medical history, surgical history, family history, social history, and previous encounter notes pertinent to obesity diagnosis.   I have personally spent 30 minutes total time today in preparation, patient care, nutritional counseling and documentation for this visit, including the following: review of clinical lab tests; review of medical tests/procedures/services.      [name], DO DABFM, DABOM [facility] [address] [place], [place] [address] [phone]

## 2022-10-16 NOTE — Assessment & Plan Note (Signed)
Last vitamin D Lab Results  Component Value Date   VD25OH 23.4 (L) 08/09/2022   Doing well on RX vitamin D weekly.  Energy level starting to improve.  Denies adverse SE  Recheck level next visit

## 2022-10-16 NOTE — Assessment & Plan Note (Signed)
Her BP continues to run high here but has been normal at other doctor's visits.  She agrees to check BP at work in the next week and send me 3 readings of resting BP.  She has been referred to [facility] for CKD stage 3 a but has not yet been scheduled.  Asked pt to call their office and set up visit.

## 2022-10-16 NOTE — Assessment & Plan Note (Signed)
Doing well on RX vitamin B12

## 2022-10-26 ENCOUNTER — Other Ambulatory Visit (HOSPITAL_COMMUNITY): Payer: Self-pay

## 2022-11-08 ENCOUNTER — Emergency Department (HOSPITAL_BASED_OUTPATIENT_CLINIC_OR_DEPARTMENT_OTHER)
Admission: EM | Admit: 2022-11-08 | Discharge: 2022-11-08 | Disposition: A | Discharge status: Home / Self Care | Payer: Commercial Managed Care - PPO | Attending: Emergency Medicine | Admitting: Emergency Medicine

## 2022-11-08 ENCOUNTER — Encounter (HOSPITAL_BASED_OUTPATIENT_CLINIC_OR_DEPARTMENT_OTHER): Payer: Self-pay

## 2022-11-08 ENCOUNTER — Other Ambulatory Visit (HOSPITAL_COMMUNITY): Payer: Self-pay

## 2022-11-08 ENCOUNTER — Other Ambulatory Visit: Payer: Self-pay

## 2022-11-08 ENCOUNTER — Emergency Department (HOSPITAL_BASED_OUTPATIENT_CLINIC_OR_DEPARTMENT_OTHER): Payer: Commercial Managed Care - PPO

## 2022-11-08 DIAGNOSIS — M545 Low back pain, unspecified: Secondary | ICD-10-CM | POA: Diagnosis not present

## 2022-11-08 DIAGNOSIS — M542 Cervicalgia: Secondary | ICD-10-CM | POA: Diagnosis not present

## 2022-11-08 DIAGNOSIS — Z79899 Other long term (current) drug therapy: Secondary | ICD-10-CM | POA: Insufficient documentation

## 2022-11-08 DIAGNOSIS — I10 Essential (primary) hypertension: Secondary | ICD-10-CM | POA: Diagnosis not present

## 2022-11-08 DIAGNOSIS — M438X5 Other specified deforming dorsopathies, thoracolumbar region: Secondary | ICD-10-CM | POA: Diagnosis not present

## 2022-11-08 DIAGNOSIS — Z794 Long term (current) use of insulin: Secondary | ICD-10-CM | POA: Diagnosis not present

## 2022-11-08 DIAGNOSIS — M47816 Spondylosis without myelopathy or radiculopathy, lumbar region: Secondary | ICD-10-CM | POA: Diagnosis not present

## 2022-11-08 DIAGNOSIS — M25511 Pain in right shoulder: Secondary | ICD-10-CM | POA: Diagnosis not present

## 2022-11-08 DIAGNOSIS — R519 Headache, unspecified: Secondary | ICD-10-CM | POA: Diagnosis not present

## 2022-11-08 DIAGNOSIS — J45909 Unspecified asthma, uncomplicated: Secondary | ICD-10-CM | POA: Diagnosis not present

## 2022-11-08 MED ORDER — ACETAMINOPHEN 325 MG PO TABS
650.0000 mg | ORAL_TABLET | Freq: Once | ORAL | Status: AC
  Administered 2022-11-08: 650 mg via ORAL
  Filled 2022-11-08: qty 2

## 2022-11-08 MED ORDER — ONDANSETRON HCL 4 MG PO TABS
4.0000 mg | ORAL_TABLET | Freq: Four times a day (QID) | ORAL | 0 refills | Status: DC | PRN
  Filled 2022-11-08 – 2023-01-05 (×2): qty 12, 3d supply, fill #0

## 2022-11-08 MED ORDER — ONDANSETRON 4 MG PO TBDP
4.0000 mg | ORAL_TABLET | Freq: Once | ORAL | Status: AC
  Administered 2022-11-08: 4 mg via ORAL
  Filled 2022-11-08: qty 1

## 2022-11-08 MED ORDER — CYCLOBENZAPRINE HCL 10 MG PO TABS
10.0000 mg | ORAL_TABLET | Freq: Two times a day (BID) | ORAL | 0 refills | Status: DC | PRN
  Filled 2022-11-08 – 2022-12-13 (×3): qty 20, 10d supply, fill #0

## 2022-11-08 MED ORDER — KETOROLAC TROMETHAMINE 15 MG/ML IJ SOLN
15.0000 mg | Freq: Once | INTRAMUSCULAR | Status: AC
  Administered 2022-11-08: 15 mg via INTRAMUSCULAR
  Filled 2022-11-08: qty 1
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
## 2022-11-08 NOTE — ED Notes (Signed)
Reviewed discharge instructions , and medications with pt. Pt states understanding. Pain has improved. Ambulatory at discharge
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
## 2022-11-14 ENCOUNTER — Other Ambulatory Visit (HOSPITAL_COMMUNITY): Payer: Self-pay

## 2022-11-16 ENCOUNTER — Other Ambulatory Visit (HOSPITAL_COMMUNITY): Payer: Self-pay

## 2022-11-19 ENCOUNTER — Other Ambulatory Visit (HOSPITAL_COMMUNITY): Payer: Self-pay

## 2022-11-19 ENCOUNTER — Ambulatory Visit (INDEPENDENT_AMBULATORY_CARE_PROVIDER_SITE_OTHER): Payer: Commercial Managed Care - PPO | Admitting: Family Medicine

## 2022-11-19 DIAGNOSIS — M546 Pain in thoracic spine: Secondary | ICD-10-CM | POA: Diagnosis not present

## 2022-11-19 DIAGNOSIS — M542 Cervicalgia: Secondary | ICD-10-CM | POA: Diagnosis not present

## 2022-11-19 MED ORDER — IBUPROFEN 800 MG PO TABS
800.0000 mg | ORAL_TABLET | Freq: Three times a day (TID) | ORAL | 2 refills | Status: DC
  Filled 2022-11-19: qty 30, 10d supply, fill #0
  Filled 2023-03-22: qty 30, 10d supply, fill #1
  Filled 2023-10-15: qty 30, 10d supply, fill #2

## 2022-11-23 ENCOUNTER — Other Ambulatory Visit (HOSPITAL_COMMUNITY): Payer: Self-pay

## 2022-11-28 ENCOUNTER — Other Ambulatory Visit: Payer: Self-pay

## 2022-11-28 ENCOUNTER — Ambulatory Visit: Payer: Commercial Managed Care - PPO | Attending: Orthopedic Surgery

## 2022-11-28 DIAGNOSIS — G8929 Other chronic pain: Secondary | ICD-10-CM | POA: Diagnosis present

## 2022-11-28 DIAGNOSIS — M542 Cervicalgia: Secondary | ICD-10-CM | POA: Insufficient documentation

## 2022-11-28 DIAGNOSIS — M25511 Pain in right shoulder: Secondary | ICD-10-CM | POA: Insufficient documentation

## 2022-11-28 DIAGNOSIS — M6281 Muscle weakness (generalized): Secondary | ICD-10-CM | POA: Diagnosis present
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
## 2022-12-01 ENCOUNTER — Other Ambulatory Visit (HOSPITAL_COMMUNITY): Payer: Self-pay

## 2022-12-05 ENCOUNTER — Ambulatory Visit: Payer: Commercial Managed Care - PPO

## 2022-12-05 DIAGNOSIS — M542 Cervicalgia: Secondary | ICD-10-CM | POA: Diagnosis not present

## 2022-12-05 DIAGNOSIS — G8929 Other chronic pain: Secondary | ICD-10-CM

## 2022-12-05 DIAGNOSIS — M6281 Muscle weakness (generalized): Secondary | ICD-10-CM
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
## 2022-12-06 ENCOUNTER — Ambulatory Visit: Payer: Commercial Managed Care - PPO | Admitting: Physical Therapy

## 2022-12-10 ENCOUNTER — Other Ambulatory Visit: Payer: Self-pay

## 2022-12-10 ENCOUNTER — Other Ambulatory Visit (HOSPITAL_COMMUNITY): Payer: Self-pay

## 2022-12-10 DIAGNOSIS — E782 Mixed hyperlipidemia: Secondary | ICD-10-CM | POA: Diagnosis not present

## 2022-12-10 DIAGNOSIS — I10 Essential (primary) hypertension: Secondary | ICD-10-CM | POA: Diagnosis not present

## 2022-12-10 DIAGNOSIS — K219 Gastro-esophageal reflux disease without esophagitis: Secondary | ICD-10-CM | POA: Diagnosis not present

## 2022-12-10 DIAGNOSIS — N1831 Chronic kidney disease, stage 3a: Secondary | ICD-10-CM | POA: Diagnosis not present

## 2022-12-10 DIAGNOSIS — Z0001 Encounter for general adult medical examination with abnormal findings: Secondary | ICD-10-CM | POA: Diagnosis not present

## 2022-12-10 DIAGNOSIS — Z131 Encounter for screening for diabetes mellitus: Secondary | ICD-10-CM | POA: Diagnosis not present

## 2022-12-10 DIAGNOSIS — E559 Vitamin D deficiency, unspecified: Secondary | ICD-10-CM | POA: Diagnosis not present

## 2022-12-10 DIAGNOSIS — R7303 Prediabetes: Secondary | ICD-10-CM | POA: Diagnosis not present

## 2022-12-10 DIAGNOSIS — J4550 Severe persistent asthma, uncomplicated: Secondary | ICD-10-CM | POA: Diagnosis not present

## 2022-12-10 DIAGNOSIS — E538 Deficiency of other specified B group vitamins: Secondary | ICD-10-CM | POA: Diagnosis not present

## 2022-12-10 MED ORDER — LEVALBUTEROL TARTRATE 45 MCG/ACT IN AERO
2.0000 | INHALATION_SPRAY | RESPIRATORY_TRACT | 9 refills | Status: DC
  Filled 2022-12-10 (×2): qty 15, 30d supply, fill #0

## 2022-12-10 MED ORDER — LEVALBUTEROL HCL 1.25 MG/0.5ML IN NEBU
INHALATION_SOLUTION | RESPIRATORY_TRACT | 6 refills | Status: DC
  Filled 2022-12-10 – 2022-12-11 (×2): qty 45, 30d supply, fill #0

## 2022-12-10 MED ORDER — HYDROCHLOROTHIAZIDE 25 MG PO TABS
25.0000 mg | ORAL_TABLET | Freq: Every day | ORAL | 1 refills | Status: DC
  Filled 2022-12-10: qty 90, 90d supply, fill #0
  Filled 2023-04-29: qty 90, 90d supply, fill #1

## 2022-12-10 MED ORDER — PANTOPRAZOLE SODIUM 40 MG PO TBEC
40.0000 mg | DELAYED_RELEASE_TABLET | Freq: Every day | ORAL | 3 refills | Status: DC
  Filled 2022-12-10 (×2): qty 90, 90d supply, fill #0

## 2022-12-10 MED ORDER — VITAMIN B-12 1000 MCG PO TABS: 1000.0000 ug | ORAL_TABLET | Freq: Every day | ORAL | 1 refills | Status: DC

## 2022-12-10 MED ORDER — BUDESONIDE-FORMOTEROL FUMARATE 160-4.5 MCG/ACT IN AERO
2.0000 | INHALATION_SPRAY | Freq: Two times a day (BID) | RESPIRATORY_TRACT | 9 refills | Status: DC
  Filled 2022-12-10 (×2): qty 10.2, 30d supply, fill #0

## 2022-12-10 MED ORDER — DILTIAZEM HCL ER BEADS 360 MG PO CP24
360.0000 mg | ORAL_CAPSULE | Freq: Every day | ORAL | 1 refills | Status: DC
  Filled 2022-12-10 (×2): qty 90, 90d supply, fill #0

## 2022-12-10 MED ORDER — D-3-5 125 MCG (5000 UT) PO CAPS: 5000.0000 [IU] | ORAL_CAPSULE | Freq: Every day | ORAL | 1 refills | Status: DC

## 2022-12-11 ENCOUNTER — Other Ambulatory Visit (HOSPITAL_COMMUNITY): Payer: Self-pay

## 2022-12-11 ENCOUNTER — Ambulatory Visit: Payer: Commercial Managed Care - PPO | Admitting: Physical Therapy

## 2022-12-11 ENCOUNTER — Encounter: Payer: Self-pay | Admitting: Physical Therapy

## 2022-12-11 ENCOUNTER — Other Ambulatory Visit: Payer: Self-pay

## 2022-12-11 VITALS — BP 166/120 | HR 78

## 2022-12-11 DIAGNOSIS — G8929 Other chronic pain: Secondary | ICD-10-CM

## 2022-12-11 DIAGNOSIS — M542 Cervicalgia: Secondary | ICD-10-CM

## 2022-12-11 DIAGNOSIS — M6281 Muscle weakness (generalized): Secondary | ICD-10-CM
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
## 2022-12-11 NOTE — Therapy (Signed)
[facility] [address] [place], [place], [address] Phone: [phone]   Fax:  [phone]  Patient Details  Name: [name] MRN: [id number] Date of Birth: [birth year] Referring Provider:  Jackie Plum, MD  Encounter Date: 12/11/2022  Session arrive no charge; BP out of range. Patient reports that she has not been taking her BP medication regularly but is back on it. Per chart review, BP elevated yesterday and again elevated today outside of cutoff range with diastolic > 100 when rechecked after initial elevated readings. Therapist encouraged patient to follow up with PCP about safe ranges and recommended patient go to ED if became symptomatic. Patient reports headache related to neck pain that is normal for her but denies any symptoms. Therapist recommend further evaluation in ED for safety but patient refused. Will continue POC as able.   Vitals:   12/11/22 0819 12/11/22 0824  BP: (!) 179/98 (!) 166/120  Pulse:  78     Carmelia Bake, PT, DPT 12/11/2022, 8:40 AM   [facility] [address] [place], [place], [address] Phone: [phone]   Fax:  [phone]

## 2022-12-13 ENCOUNTER — Other Ambulatory Visit (HOSPITAL_COMMUNITY): Payer: Self-pay

## 2022-12-13 ENCOUNTER — Ambulatory Visit: Payer: Commercial Managed Care - PPO | Admitting: Physical Therapy

## 2022-12-13 ENCOUNTER — Encounter: Payer: Self-pay | Admitting: Physical Therapy

## 2022-12-13 VITALS — BP 121/80 | HR 86

## 2022-12-13 DIAGNOSIS — M6281 Muscle weakness (generalized): Secondary | ICD-10-CM

## 2022-12-13 DIAGNOSIS — G8929 Other chronic pain: Secondary | ICD-10-CM

## 2022-12-13 DIAGNOSIS — M542 Cervicalgia: Secondary | ICD-10-CM | POA: Diagnosis not present

## 2022-12-13 MED ORDER — TRAMADOL HCL 50 MG PO TABS
50.0000 mg | ORAL_TABLET | Freq: Four times a day (QID) | ORAL | 0 refills | Status: DC
  Filled 2022-12-13: qty 30, 8d supply, fill #0
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
## 2022-12-14 ENCOUNTER — Ambulatory Visit: Payer: Commercial Managed Care - PPO | Admitting: Physical Therapy
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
## 2022-12-17 ENCOUNTER — Ambulatory Visit: Payer: Commercial Managed Care - PPO | Admitting: Physical Therapy

## 2022-12-17 ENCOUNTER — Encounter: Payer: Self-pay | Admitting: Physical Therapy

## 2022-12-17 VITALS — BP 159/93 | HR 91

## 2022-12-17 DIAGNOSIS — M542 Cervicalgia: Secondary | ICD-10-CM | POA: Diagnosis not present

## 2022-12-17 DIAGNOSIS — M6281 Muscle weakness (generalized): Secondary | ICD-10-CM

## 2022-12-17 DIAGNOSIS — G8929 Other chronic pain: Secondary | ICD-10-CM
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
## 2022-12-18 ENCOUNTER — Ambulatory Visit: Payer: Commercial Managed Care - PPO | Admitting: Internal Medicine

## 2022-12-18 ENCOUNTER — Other Ambulatory Visit (HOSPITAL_BASED_OUTPATIENT_CLINIC_OR_DEPARTMENT_OTHER): Payer: Self-pay

## 2022-12-18 ENCOUNTER — Encounter: Payer: Self-pay | Admitting: Internal Medicine

## 2022-12-18 ENCOUNTER — Other Ambulatory Visit (HOSPITAL_COMMUNITY): Payer: Self-pay

## 2022-12-18 VITALS — BP 138/88 | HR 80 | Temp 97.2°F | Ht 63.0 in | Wt 237.0 lb

## 2022-12-18 DIAGNOSIS — U099 Post covid-19 condition, unspecified: Secondary | ICD-10-CM | POA: Diagnosis not present

## 2022-12-18 DIAGNOSIS — R438 Other disturbances of smell and taste: Secondary | ICD-10-CM | POA: Diagnosis not present

## 2022-12-18 DIAGNOSIS — J4550 Severe persistent asthma, uncomplicated: Secondary | ICD-10-CM

## 2022-12-18 MED ORDER — ESOMEPRAZOLE MAGNESIUM 40 MG PO CPDR
40.0000 mg | DELAYED_RELEASE_CAPSULE | Freq: Every morning | ORAL | 5 refills | Status: AC
  Filled 2022-12-18: qty 30, 30d supply, fill #0

## 2022-12-18 NOTE — Patient Instructions (Signed)
Order- cancel Dupixent application  Script sent refilling Nexium

## 2022-12-18 NOTE — Assessment & Plan Note (Signed)
Currently uncomplicated.  She is satisfied with Symbicort for maintenance control.  We are going to defer Dupixent for now and can reconsider that or an alternative later based on need.

## 2022-12-18 NOTE — Assessment & Plan Note (Signed)
She reports she has not regained smell or taste

## 2022-12-19 ENCOUNTER — Other Ambulatory Visit (HOSPITAL_COMMUNITY): Payer: Self-pay

## 2022-12-20 ENCOUNTER — Telehealth: Payer: Self-pay | Admitting: Physical Therapy

## 2022-12-20 ENCOUNTER — Ambulatory Visit: Payer: Commercial Managed Care - PPO | Admitting: Physical Therapy

## 2022-12-20 NOTE — Telephone Encounter (Signed)
Called patient and patient informed therapist that she thought appointment was on Friday. Therapist will call patient back if there is an opening on Friday to get her back on the schedule; otherwise will see patient next Tuesday.  Maryruth Eve, PT, DPT

## 2022-12-25 ENCOUNTER — Encounter: Payer: Self-pay | Admitting: Physical Therapy

## 2022-12-25 ENCOUNTER — Ambulatory Visit: Payer: Commercial Managed Care - PPO | Attending: Internal Medicine | Admitting: Physical Therapy

## 2022-12-25 VITALS — BP 127/80 | HR 78

## 2022-12-25 DIAGNOSIS — M25511 Pain in right shoulder: Secondary | ICD-10-CM | POA: Diagnosis present

## 2022-12-25 DIAGNOSIS — M6281 Muscle weakness (generalized): Secondary | ICD-10-CM | POA: Insufficient documentation

## 2022-12-25 DIAGNOSIS — M542 Cervicalgia: Secondary | ICD-10-CM | POA: Insufficient documentation

## 2022-12-25 DIAGNOSIS — G8929 Other chronic pain: Secondary | ICD-10-CM | POA: Insufficient documentation
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
## 2022-12-28 ENCOUNTER — Ambulatory Visit: Payer: Commercial Managed Care - PPO | Admitting: Physical Therapy

## 2022-12-28 DIAGNOSIS — M6281 Muscle weakness (generalized): Secondary | ICD-10-CM

## 2022-12-28 DIAGNOSIS — M542 Cervicalgia: Secondary | ICD-10-CM

## 2022-12-28 DIAGNOSIS — G8929 Other chronic pain: Secondary | ICD-10-CM

## 2022-12-28 NOTE — Therapy (Signed)
[facility] [address] [place], [place], [address] Phone: [phone]   Fax:  [phone]  Patient Details  Name: [name] MRN: [id number] Date of Birth: 1965/06/01 Referring Provider:  Jackie Plum, MD  Encounter Date: 12/28/2022  Patient arrives to session in mask and stating she is not feeling well. Reports aches over whole body. She has tested negative for covid but states that would like to hold on visit so she doesn't expose other patients. Will continue POC at next visit.   Carmelia Bake, PT, DPT 12/28/2022, 8:19 AM  [facility] [address] [place], [place], [address] Phone: [phone]   Fax:  [phone]

## 2023-01-01 ENCOUNTER — Encounter: Payer: Self-pay | Admitting: Physical Therapy

## 2023-01-01 ENCOUNTER — Ambulatory Visit: Payer: Commercial Managed Care - PPO | Admitting: Physical Therapy

## 2023-01-01 VITALS — BP 146/96 | HR 84

## 2023-01-01 DIAGNOSIS — M6281 Muscle weakness (generalized): Secondary | ICD-10-CM

## 2023-01-01 DIAGNOSIS — M542 Cervicalgia: Secondary | ICD-10-CM | POA: Diagnosis not present
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
## 2023-01-02 ENCOUNTER — Other Ambulatory Visit (HOSPITAL_COMMUNITY): Payer: Self-pay

## 2023-01-02 MED ORDER — LOSARTAN POTASSIUM 25 MG PO TABS
25.0000 mg | ORAL_TABLET | Freq: Every day | ORAL | 3 refills | Status: DC
  Filled 2023-01-02: qty 90, 90d supply, fill #0
  Filled 2023-03-22: qty 90, 90d supply, fill #1

## 2023-01-03 ENCOUNTER — Ambulatory Visit: Payer: Commercial Managed Care - PPO

## 2023-01-03 ENCOUNTER — Other Ambulatory Visit: Payer: Self-pay | Admitting: Nephrology

## 2023-01-03 DIAGNOSIS — M542 Cervicalgia: Secondary | ICD-10-CM

## 2023-01-03 DIAGNOSIS — N1831 Chronic kidney disease, stage 3a: Secondary | ICD-10-CM

## 2023-01-03 DIAGNOSIS — G8929 Other chronic pain: Secondary | ICD-10-CM

## 2023-01-03 DIAGNOSIS — M6281 Muscle weakness (generalized): Secondary | ICD-10-CM
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
## 2023-01-04 ENCOUNTER — Other Ambulatory Visit (HOSPITAL_COMMUNITY): Payer: Self-pay

## 2023-01-04 ENCOUNTER — Ambulatory Visit: Payer: Commercial Managed Care - PPO

## 2023-01-05 ENCOUNTER — Other Ambulatory Visit (HOSPITAL_COMMUNITY): Payer: Self-pay

## 2023-01-07 ENCOUNTER — Encounter: Payer: Self-pay | Admitting: Family Medicine

## 2023-01-07 ENCOUNTER — Ambulatory Visit (INDEPENDENT_AMBULATORY_CARE_PROVIDER_SITE_OTHER): Payer: Commercial Managed Care - PPO | Admitting: Family Medicine

## 2023-01-07 VITALS — BP 146/85 | HR 65 | Temp 98.1°F | Ht 63.0 in | Wt 236.0 lb

## 2023-01-07 DIAGNOSIS — N1831 Chronic kidney disease, stage 3a: Secondary | ICD-10-CM

## 2023-01-07 DIAGNOSIS — E559 Vitamin D deficiency, unspecified: Secondary | ICD-10-CM | POA: Diagnosis not present

## 2023-01-07 DIAGNOSIS — Z6841 Body Mass Index (BMI) 40.0 and over, adult: Secondary | ICD-10-CM

## 2023-01-07 DIAGNOSIS — E1122 Type 2 diabetes mellitus with diabetic chronic kidney disease: Secondary | ICD-10-CM | POA: Diagnosis not present

## 2023-01-07 DIAGNOSIS — E538 Deficiency of other specified B group vitamins: Secondary | ICD-10-CM

## 2023-01-07 DIAGNOSIS — E119 Type 2 diabetes mellitus without complications: Secondary | ICD-10-CM

## 2023-01-07 DIAGNOSIS — Z7985 Long-term (current) use of injectable non-insulin antidiabetic drugs: Secondary | ICD-10-CM

## 2023-01-07 NOTE — Assessment & Plan Note (Signed)
Notes unavailable for review by nephrology.  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  This has prompted her to thinking about a future date for bilateral knee arthroplasty with Dr. Charlann Boxer.  She is currently using Tylenol as needed for pain.  Her knee pain has limited her ability to increase exercise for weight reduction.  Continue current plan of care per nephrology.  Avoid nephrotoxic medications.  Continue daily protein target around 85 g/day

## 2023-01-07 NOTE — Assessment & Plan Note (Signed)
Lab Results  Component Value Date   HGBA1C 6.0 (H) 08/09/2022   She is doing well on Ozempic 2 mg once weekly injection.  She is working on dietary change, weight reduction and increase levels of physical activity currently limited by DJD pain in both knees  Continue prescribed meal plan.  Avoid foods and drinks with high sugar. Continue active plan for weight reduction and check A1c today

## 2023-01-07 NOTE — Assessment & Plan Note (Signed)
Last vitamin D Lab Results  Component Value Date   VD25OH 23.4 (L) 08/09/2022   She has been taking vitamin D 50,000 IU once weekly.  Her energy level is starting to improve  Check vitamin D level today

## 2023-01-07 NOTE — Assessment & Plan Note (Signed)
She has been using B12 weekly injections for a profound vitamin B12 deficiency.  Her energy level is starting to improve.  Paresthesias and brain fog have improved.  Recheck B12 level today
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
## 2023-01-08 LAB — VITAMIN B12: Vitamin B-12: 1885 pg/mL — ABNORMAL HIGH (ref 232–1245)

## 2023-01-08 LAB — HEMOGLOBIN A1C
Est. average glucose Bld gHb Est-mCnc: 131 mg/dL
Hgb A1c MFr Bld: 6.2 % — ABNORMAL HIGH (ref 4.8–5.6)

## 2023-01-08 LAB — VITAMIN D 25 HYDROXY (VIT D DEFICIENCY, FRACTURES): Vit D, 25-Hydroxy: 36 ng/mL (ref 30.0–100.0)

## 2023-01-10 ENCOUNTER — Ambulatory Visit: Payer: Commercial Managed Care - PPO

## 2023-01-14 ENCOUNTER — Ambulatory Visit
Admission: RE | Admit: 2023-01-14 | Discharge: 2023-01-14 | Disposition: A | Discharge status: Home / Self Care | Payer: Commercial Managed Care - PPO | Source: Ambulatory Visit | Attending: Nephrology | Admitting: Nephrology

## 2023-01-14 DIAGNOSIS — N1831 Chronic kidney disease, stage 3a: Secondary | ICD-10-CM

## 2023-01-17 ENCOUNTER — Ambulatory Visit: Payer: Commercial Managed Care - PPO

## 2023-01-30 ENCOUNTER — Other Ambulatory Visit (INDEPENDENT_AMBULATORY_CARE_PROVIDER_SITE_OTHER): Payer: Self-pay | Admitting: Family Medicine

## 2023-01-30 DIAGNOSIS — E119 Type 2 diabetes mellitus without complications: Secondary | ICD-10-CM

## 2023-01-31 ENCOUNTER — Other Ambulatory Visit (HOSPITAL_COMMUNITY): Payer: Self-pay

## 2023-02-01 ENCOUNTER — Other Ambulatory Visit (HOSPITAL_COMMUNITY): Payer: Self-pay

## 2023-02-09 ENCOUNTER — Telehealth: Payer: Commercial Managed Care - PPO | Admitting: Physician Assistant

## 2023-02-09 ENCOUNTER — Other Ambulatory Visit (HOSPITAL_COMMUNITY): Payer: Self-pay

## 2023-02-09 DIAGNOSIS — U071 COVID-19: Secondary | ICD-10-CM | POA: Diagnosis not present

## 2023-02-09 MED ORDER — BENZONATATE 100 MG PO CAPS
100.0000 mg | ORAL_CAPSULE | Freq: Three times a day (TID) | ORAL | 0 refills | Status: DC | PRN
Start: 2023-02-09 — End: 2023-12-12
  Filled 2023-02-09: qty 30, 10d supply, fill #0

## 2023-02-09 MED ORDER — NIRMATRELVIR/RITONAVIR (PAXLOVID) TABLET (RENAL DOSING)
2.0000 | ORAL_TABLET | Freq: Two times a day (BID) | ORAL | 0 refills | Status: AC
Start: 2023-02-09 — End: 2023-02-14
  Filled 2023-02-09: qty 20, 5d supply, fill #0

## 2023-02-09 NOTE — Patient Instructions (Signed)
Erroll Luna, thank you for joining Piedad Climes, PA-C for today's virtual visit.  While this provider is not your primary care provider (PCP), if your PCP is located in our provider database this encounter information will be shared with them immediately following your visit.   A [facility] MyChart account gives you access to today's visit and all your visits, tests, and labs performed at [facility] " click here if you don't have a [facility] MyChart account or go to [email or web address]  Consent: (Patient) [name] provided verbal consent for this virtual visit at the beginning of the encounter.  Current Medications:  Current Outpatient Medications:    albuterol (PROVENTIL) (2.5 MG/3ML) 0.083% nebulizer solution, USE 1 VIAL VIA NEBULIZER EVERY 6 HOURS AS NEEDED FOR WHEEZING OR SHORTNESS OF BREATH, Disp: 90 mL, Rfl: 12   albuterol (VENTOLIN HFA) 108 (90 Base) MCG/ACT inhaler, Inhale 2 puffs into the lungs every 6 hours as needed for wheezing, Disp: 18 g, Rfl: 12   ALPRAZolam (XANAX) 1 MG tablet, take 1/2 tablets by mouth every 8 hours as needed for anxiety and 30 mins before flight 10 days, Disp: 15 tablet, Rfl: 0   Azelastine-Fluticasone (DYMISTA) 137-50 MCG/ACT SUSP, Use 1 spray in each nostril twice daily as directed, Disp: 23 g, Rfl: 5   benzonatate (TESSALON) 200 MG capsule, Take 1 capsule (200 mg total) by mouth 3 (three) times daily as needed for cough., Disp: 30 capsule, Rfl: 1   budesonide-formoterol (SYMBICORT) 160-4.5 MCG/ACT inhaler, Inhale 2 puff(s) into the lungs 2 times daily., Disp: 10.2 g, Rfl: 9   chlorpheniramine-HYDROcodone (TUSSIONEX) 10-8 MG/5ML, Take 5 mLs by mouth every 12 (twelve) hours as needed for cough., Disp: 115 mL, Rfl: 0   Cholecalciferol (D-3-5) 125 MCG (5000 UT) capsule, Take 1 capsule (5,000 Units total) by mouth daily., Disp: 90 capsule, Rfl: 1   cyanocobalamin (VITAMIN B12) 1000 MCG tablet, Take 1 tablet (1,000 mcg total)  by mouth daily., Disp: 90 tablet, Rfl: 1   cyanocobalamin (VITAMIN B12) 1000 MCG/ML injection, Inject 1 mL (1,000 mcg total) into the muscle once a week., Disp: 10 mL, Rfl: 0   cyclobenzaprine (FLEXERIL) 10 MG tablet, Take 1 tablet (10 mg total) by mouth 2 (two) times daily as needed for muscle spasms., Disp: 20 tablet, Rfl: 0   diltiazem (TIAZAC) 360 MG 24 hr capsule, Take 1 capsule (360 mg total) by mouth daily., Disp: 90 capsule, Rfl: 1   EPINEPHrine 0.3 mg/0.3 mL IJ SOAJ injection, Inject into thigh as directed for severe allergic reaction/asthma., Disp: 2 each, Rfl: 12   esomeprazole (NEXIUM) 40 MG capsule, Take 1 capsule (40 mg total) by mouth every morning., Disp: 30 capsule, Rfl: 5   furosemide (LASIX) 40 MG tablet, TAKE 1 TABLET BY MOUTH ONCE DAILY AS NEEDED (Patient taking differently: Take 40 mg by mouth daily as needed for fluid.), Disp: 30 tablet, Rfl: 5   hydrochlorothiazide (HYDRODIURIL) 25 MG tablet, Take 1 tablet (25 mg total) by mouth daily., Disp: 90 tablet, Rfl: 1   ibuprofen (ADVIL) 800 MG tablet, Take 1 tablet (800 mg total) by mouth 3 (three) times daily for 10 days., Disp: 30 tablet, Rfl: 2   Insulin Syringe-Needle U-100 (SAFETY INSULIN SYRINGES) 30G X 1/2" 1 ML MISC, Use once weekly as directed, Disp: 90 each, Rfl: 0   ipratropium-albuterol (DUONEB) 0.5-2.5 (3) MG/3ML SOLN, Inhale 1 vial via nebulizer every 4 hours if needed., Disp: 360 mL, Rfl: 12   levalbuterol (XOPENEX HFA)  45 MCG/ACT inhaler, Inhale 2 puffs into the lungs every 4 (four) hours., Disp: 15 g, Rfl: 9   levalbuterol (XOPENEX) 1.25 MG/0.5ML nebulizer solution, Inhale 1.25 mg into the lungs via nebulizer 3 (three) times daily., Disp: 45 mL, Rfl: 6   levalbuterol (XOPENEX) 1.25 MG/0.5ML nebulizer solution, Inhale 1 vial via nebulizer 3 times a day, Disp: 45 mL, Rfl: 6   levocetirizine (XYZAL) 5 MG tablet, Take 1 tablet (5 mg total) by mouth every evening., Disp: 30 tablet, Rfl: 5   LORazepam (ATIVAN) 1 MG tablet,  Take 1 tablet (1 mg total) by mouth every 8 (eight) hours as needed for anxiety, Disp: 30 tablet, Rfl: 5   losartan (COZAAR) 25 MG tablet, Take 1 tablet (25 mg total) by mouth at bedtime., Disp: 90 tablet, Rfl: 3   metroNIDAZOLE (METROGEL VAGINAL) 0.75 % vaginal gel, Insert 1 applicatorful in vagina every night for 5 nights as directed as needed., Disp: 70 g, Rfl: 2   montelukast (SINGULAIR) 10 MG tablet, Take 1 tablet (10 mg total) by mouth at bedtime., Disp: 30 tablet, Rfl: 10   olopatadine (PATANOL) 0.1 % ophthalmic solution, INSTILL 1 DROP INTO BOTH EYES TWO TIMES DAILY AS NEEDED FOR ALLERGIES, Disp: 5 mL, Rfl: 5   ondansetron (ZOFRAN) 4 MG tablet, Take 1 tablet (4 mg total) by mouth every 6 (six) hours as needed for nausea or vomiting., Disp: 12 tablet, Rfl: 0   ondansetron (ZOFRAN-ODT) 4 MG disintegrating tablet, Dissolve 1 tablet (4 mg total) by mouth every 8 (eight) hours as needed for nausea or vomiting., Disp: 20 tablet, Rfl: 1   predniSONE (DELTASONE) 10 MG tablet, Take 4 tabs by mouth for 3 days, then 3 for 3 days, 2 for 3 days, 1 for 3 days and stop, Disp: 30 tablet, Rfl: 0   Semaglutide, 2 MG/DOSE, (OZEMPIC, 2 MG/DOSE,) 8 MG/3ML SOPN, Inject 2 mg into the skin once a week as directed, Disp: 3 mL, Rfl: 0   Spacer/Aero-Holding Chambers (AEROCHAMBER MV) inhaler, Use as instructed, Disp: 1 each, Rfl: 0   traMADol (ULTRAM) 50 MG tablet, Take 1 tablet (50 mg total) by mouth every 6 (six) hours., Disp: 30 tablet, Rfl: 0   TRANSDERM-SCOP, 1.5 MG, 1 MG/3DAYS, APPLY 1 PATCH ONTO THE SKIN EVERY 3 DAYS., Disp: 10 patch, Rfl: 12   Vitamin D, Ergocalciferol, (DRISDOL) 1.25 MG (50000 UNIT) CAPS capsule, Take 1 capsule by mouth every 7 days., Disp: 5 capsule, Rfl: 0  Current Facility-Administered Medications:    methylPREDNISolone acetate (DEPO-MEDROL) injection 80 mg, 80 mg, Intramuscular, Once, Omar Person, MD   Medications ordered in this encounter:  No orders of the defined types were  placed in this encounter.    *If you need refills on other medications prior to your next appointment, please contact your pharmacy*  Follow-Up: Call back or seek an in-person evaluation if the symptoms worsen or if the condition fails to improve as anticipated.  [facility] [phone]  Care Instructions: Please keep well-hydrated and get plenty of rest. Start a saline nasal rinse to flush out your nasal passages. You can use plain Mucinex to help thin congestion. If you have a humidifier, running in the bedroom at night. I want you to start OTC vitamin D3 1000 units daily, vitamin C 1000 mg daily, and a zinc supplement. Please take prescribed medications as directed.    Isolation Instructions: You are to isolate at home until you have been fever free for at least 24  hours without a fever-reducing medication, and symptoms have been steadily improving for 24 hours. At that time,  you can end isolation but need to mask for an additional 5 days.   If you must be around other household members who do not have symptoms, you need to make sure that both you and the family members are masking consistently with a high-quality mask.  If you note any worsening of symptoms despite treatment, please seek an in-person evaluation ASAP. If you note any significant shortness of breath or any chest pain, please seek ER evaluation. Please do not delay care!   COVID-19: What to Do if You Are Sick If you test positive and are an older adult or someone who is at high risk of getting very sick from COVID-19, treatment may be available. Contact a healthcare provider right away after a positive test to determine if you are eligible, even if your symptoms are mild right now. You can also visit a Test to Treat location and, if eligible, receive a prescription from a provider. Don't delay: Treatment must be started within the first few days to be effective. If you have a fever, cough, or other  symptoms, you might have COVID-19. Most people have mild illness and are able to recover at home. If you are sick: Keep track of your symptoms. If you have an emergency warning sign (including trouble breathing), call 911. Steps to help prevent the spread of COVID-19 if you are sick If you are sick with COVID-19 or think you might have COVID-19, follow the steps below to care for yourself and to help protect other people in your home and community. Stay home except to get medical care Stay home. Most people with COVID-19 have mild illness and can recover at home without medical care. Do not leave your home, except to get medical care. Do not visit public areas and do not go to places where you are unable to wear a mask. Take care of yourself. Get rest and stay hydrated. Take over-the-counter medicines, such as acetaminophen, to help you feel better. Stay in touch with your doctor. Call before you get medical care. Be sure to get care if you have trouble breathing, or have any other emergency warning signs, or if you think it is an emergency. Avoid public transportation, ride-sharing, or taxis if possible. Get tested If you have symptoms of COVID-19, get tested. While waiting for test results, stay away from others, including staying apart from those living in your household. Get tested as soon as possible after your symptoms start. Treatments may be available for people with COVID-19 who are at risk for becoming very sick. Don't delay: Treatment must be started early to be effective--some treatments must begin within 5 days of your first symptoms. Contact your healthcare provider right away if your test result is positive to determine if you are eligible. Self-tests are one of several options for testing for the virus that causes COVID-19 and may be more convenient than laboratory-based tests and point-of-care tests. Ask your healthcare provider or your local health department if you need help  interpreting your test results. You can visit your state, tribal, local, and territorial health department's website to look for the latest local information on testing sites. Separate yourself from other people As much as possible, stay in a specific room and away from other people and pets in your home. If possible, you should use a separate bathroom. If you need to be around other people or  animals in or outside of the home, wear a well-fitting mask. Tell your close contacts that they may have been exposed to COVID-19. An infected person can spread COVID-19 starting 48 hours (or 2 days) before the person has any symptoms or tests positive. By letting your close contacts know they may have been exposed to COVID-19, you are helping to protect everyone. See COVID-19 and Animals if you have questions about pets. If you are diagnosed with COVID-19, someone from the health department may call you. Answer the call to slow the spread. Monitor your symptoms Symptoms of COVID-19 include fever, cough, or other symptoms. Follow care instructions from your healthcare provider and local health department. Your local health authorities may give instructions on checking your symptoms and reporting information. When to seek emergency medical attention Look for emergency warning signs* for COVID-19. If someone is showing any of these signs, seek emergency medical care immediately: Trouble breathing Persistent pain or pressure in the chest New confusion Inability to wake or stay awake Pale, gray, or blue-colored skin, lips, or nail beds, depending on skin tone *This list is not all possible symptoms. Please call your medical provider for any other symptoms that are severe or concerning to you. Call 911 or call ahead to your local emergency facility: Notify the operator that you are seeking care for someone who has or may have COVID-19. Call ahead before visiting your doctor Call ahead. Many medical visits for  routine care are being postponed or done by phone or telemedicine. If you have a medical appointment that cannot be postponed, call your doctor's office, and tell them you have or may have COVID-19. This will help the office protect themselves and other patients. If you are sick, wear a well-fitting mask You should wear a mask if you must be around other people or animals, including pets (even at home). Wear a mask with the best fit, protection, and comfort for you. You don't need to wear the mask if you are alone. If you can't put on a mask (because of trouble breathing, for example), cover your coughs and sneezes in some other way. Try to stay at least 6 feet away from other people. This will help protect the people around you. Masks should not be placed on young children under age 48 years, anyone who has trouble breathing, or anyone who is not able to remove the mask without help. Cover your coughs and sneezes Cover your mouth and nose with a tissue when you cough or sneeze. Throw away used tissues in a lined trash can. Immediately wash your hands with soap and water for at least 20 seconds. If soap and water are not available, clean your hands with an alcohol-based hand sanitizer that contains at least 60% alcohol. Clean your hands often Wash your hands often with soap and water for at least 20 seconds. This is especially important after blowing your nose, coughing, or sneezing; going to the bathroom; and before eating or preparing food. Use hand sanitizer if soap and water are not available. Use an alcohol-based hand sanitizer with at least 60% alcohol, covering all surfaces of your hands and rubbing them together until they feel dry. Soap and water are the best option, especially if hands are visibly dirty. Avoid touching your eyes, nose, and mouth with unwashed hands. Handwashing Tips Avoid sharing personal household items Do not share dishes, drinking glasses, cups, eating utensils, towels,  or bedding with other people in your home. Wash these items thoroughly after  using them with soap and water or put in the dishwasher. Clean surfaces in your home regularly Clean and disinfect high-touch surfaces (for example, doorknobs, tables, handles, light switches, and countertops) in your "sick room" and bathroom. In shared spaces, you should clean and disinfect surfaces and items after each use by the person who is ill. If you are sick and cannot clean, a caregiver or other person should only clean and disinfect the area around you (such as your bedroom and bathroom) on an as needed basis. Your caregiver/other person should wait as long as possible (at least several hours) and wear a mask before entering, cleaning, and disinfecting shared spaces that you use. Clean and disinfect areas that may have blood, stool, or body fluids on them. Use household cleaners and disinfectants. Clean visible dirty surfaces with household cleaners containing soap or detergent. Then, use a household disinfectant. Use a product from [employer] List N: Disinfectants for Coronavirus (COVID-19). Be sure to follow the instructions on the label to ensure safe and effective use of the product. Many products recommend keeping the surface wet with a disinfectant for a certain period of time (look at "contact time" on the product label). You may also need to wear personal protective equipment, such as gloves, depending on the directions on the product label. Immediately after disinfecting, wash your hands with soap and water for 20 seconds. For completed guidance on cleaning and disinfecting your home, visit Complete Disinfection Guidance. Take steps to improve ventilation at home Improve ventilation (air flow) at home to help prevent from spreading COVID-19 to other people in your household. Clear out COVID-19 virus particles in the air by opening windows, using air filters, and turning on fans in your home. Use this interactive  tool to learn how to improve air flow in your home. When you can be around others after being sick with COVID-19 Deciding when you can be around others is different for different situations. Find out when you can safely end home isolation. For any additional questions about your care, contact your healthcare provider or state or local health department. 07/12/2020 Content source: [facility] for Immunization and Respiratory Diseases ([facility]), Division of Viral Diseases This information is not intended to replace advice given to you by your health care provider. Make sure you discuss any questions you have with your health care provider. Document Revised: 08/25/2020 Document Reviewed: 08/25/2020 Elsevier Patient Education  2022 [employer].  If you have been instructed to have an in-person evaluation today at a local Urgent Care facility, please use the link below. It will take you to a list of all of our available [facility], including address, phone number and hours of operation. Please do not delay care.  [facility]  If you or a family member do not have a primary care provider, use the link below to schedule a visit and establish care. When you choose a [facility] primary care physician or advanced practice provider, you gain a long-term partner in health. Find a Primary Care Provider  Learn more about [facility] in-office and virtual care options: [facility] - Get Care Now

## 2023-02-09 NOTE — Progress Notes (Signed)
Virtual Visit Consent   [name], you are scheduled for a virtual visit with a [facility] provider today. Just as with appointments in the office, your consent must be obtained to participate. Your consent will be active for this visit and any virtual visit you may have with one of our providers in the next 365 days. If you have a MyChart account, a copy of this consent can be sent to you electronically.  As this is a virtual visit, video technology does not allow for your provider to perform a traditional examination. This may limit your provider's ability to fully assess your condition. If your provider identifies any concerns that need to be evaluated in person or the need to arrange testing (such as labs, EKG, etc.), we will make arrangements to do so. Although advances in technology are sophisticated, we cannot ensure that it will always work on either your end or our end. If the connection with a video visit is poor, the visit may have to be switched to a telephone visit. With either a video or telephone visit, we are not always able to ensure that we have a secure connection.  By engaging in this virtual visit, you consent to the provision of healthcare and authorize for your insurance to be billed (if applicable) for the services provided during this visit. Depending on your insurance coverage, you may receive a charge related to this service.  I need to obtain your verbal consent now. Are you willing to proceed with your visit today? [name] has provided verbal consent on 02/09/2023 for a virtual visit (video or telephone). [name], [place]  Date: 02/09/2023 2:27 PM  Virtual Visit via Video Note   I, [name], connected with  [name]  ([id number], [birth year]) on 02/09/23 at  2:30 PM EDT by a video-enabled telemedicine application and verified that I am speaking with the correct person using two identifiers.  Location: Patient: Virtual Visit Location  Patient: Home Provider: Virtual Visit Location Provider: Home Office   I discussed the limitations of evaluation and management by telemedicine and the availability of in person appointments. The patient expressed understanding and agreed to proceed.    History of Present Illness: [name] is a 57 y.o. who identifies as a female who was assigned female at birth, and is being seen today for COVID-19. Endorses couple of days of fatigue, sore throat, start of a mild cough that is dry. Denies chest pain or SOB. Has history of asthma, noting some chest tightness. Known work exposure to [employer] at work so took a test today that was positive  Took old script for Tussionex for her cough.  eGFr 52 -- 07/2022  HPI: HPI  Problems:  Patient Active Problem List   Diagnosis Date Noted   Type 2 diabetes mellitus without complication, without long-term current use of insulin ([name]) 07/02/2022   Constipation 06/05/2022   Primary osteoarthritis of both knees 06/05/2022   Stage 3a chronic kidney disease ([name]) 05/09/2022   Long COVID 05/09/2022   Polyphagia 04/03/2022   Tear of right rotator cuff 02/27/2022   Anosmia 01/29/2022   Vitamin B12 deficiency 01/29/2022   Asthma 01/29/2022   Vitamin D deficiency 12/26/2021   B12 deficiency 12/13/2021   Other fatigue 12/12/2021   SOBOE (shortness of breath on exertion) 12/12/2021   Morbid obesity ([name]) with starting BMI 40 12/12/2021   Depression 12/12/2021   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 09/23/2020   Multiple lung nodules on CT 07/07/2019   COVID-19 virus infection 03/04/2019   Prolapsed internal hemorrhoids, grade 2 09/23/2017   OSA (obstructive sleep apnea) 09/19/2016   Thrush 05/05/2016   Allergic asthma, severe persistent, uncomplicated 05/02/2016   Steroid-induced hyperglycemia 05/08/2013   Pain of left lower extremity 05/08/2013   Moderate persistent asthma with exacerbation 05/07/2013   DYSPHAGIA 11/12/2007    Dyskinesia of esophagus 10/20/2007   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 04/30/2007   PSEUDOTUMOR CEREBRI 04/30/2007   Seasonal and perennial allergic rhinitis 04/30/2007   Bronchitis 04/30/2007   UTI'S, HX OF 04/30/2007   Essential hypertension 01/28/2007   GERD 01/28/2007    Allergies:  Allergies  Allergen Reactions   Influenza A (H1n1) Monoval Vac     Reaction: systemic reaction   Omalizumab     Geoffry Paradise* REACTION: angioedema-looses airway   Promethazine Hcl     REACTION: hallucinations with too high of dose   Topiramate    Medications:  Current Outpatient Medications:    benzonatate (TESSALON) 100 MG capsule, Take 1 capsule (100 mg total) by mouth 3 (three) times daily as needed for cough., Disp: 30 capsule, Rfl: 0   nirmatrelvir/ritonavir, renal dosing, (PAXLOVID) 10 x 150 MG & 10 x 100MG  TABS, Take 2 tablets by mouth 2 (two) times daily for 5 days. (Take nirmatrelvir 150 mg one tablet twice daily for 5 days and ritonavir 100 mg one tablet twice daily for 5 days) Patient GFR is 52, Disp: 20 tablet, Rfl: 0   albuterol (VENTOLIN HFA) 108 (90 Base) MCG/ACT inhaler, Inhale 2 puffs into the lungs every 6 hours as needed for wheezing, Disp: 18 g, Rfl: 12   ALPRAZolam (XANAX) 1 MG tablet, take 1/2 tablets by mouth every 8 hours as needed for anxiety and 30 mins before flight 10 days, Disp: 15 tablet, Rfl: 0   Azelastine-Fluticasone (DYMISTA) 137-50 MCG/ACT SUSP, Use 1 spray in each nostril twice daily as directed, Disp: 23 g, Rfl: 5   budesonide-formoterol (SYMBICORT) 160-4.5 MCG/ACT inhaler, Inhale 2 puff(s) into the lungs 2 times daily., Disp: 10.2 g, Rfl: 9   chlorpheniramine-HYDROcodone (TUSSIONEX) 10-8 MG/5ML, Take 5 mLs by mouth every 12 (twelve) hours as needed for cough., Disp: 115 mL, Rfl: 0   Cholecalciferol (D-3-5) 125 MCG (5000 UT) capsule, Take 1 capsule (5,000 Units total) by mouth daily., Disp: 90 capsule, Rfl: 1   cyanocobalamin (VITAMIN B12) 1000 MCG tablet,  Take 1 tablet (1,000 mcg total) by mouth daily., Disp: 90 tablet, Rfl: 1   cyanocobalamin (VITAMIN B12) 1000 MCG/ML injection, Inject 1 mL (1,000 mcg total) into the muscle once a week., Disp: 10 mL, Rfl: 0   cyclobenzaprine (FLEXERIL) 10 MG tablet, Take 1 tablet (10 mg total) by mouth 2 (two) times daily as needed for muscle spasms., Disp: 20 tablet, Rfl: 0   diltiazem (TIAZAC) 360 MG 24 hr capsule, Take 1 capsule (360 mg total) by mouth daily., Disp: 90 capsule, Rfl: 1   EPINEPHrine 0.3 mg/0.3 mL IJ SOAJ injection, Inject into thigh as directed for severe allergic reaction/asthma., Disp: 2 each, Rfl: 12   esomeprazole (NEXIUM) 40 MG capsule, Take 1 capsule (40 mg total) by mouth every morning., Disp: 30 capsule, Rfl: 5   furosemide (LASIX) 40 MG tablet, TAKE 1 TABLET BY MOUTH ONCE DAILY AS NEEDED (Patient taking differently: Take 40 mg by mouth daily as needed for fluid.), Disp: 30 tablet, Rfl: 5   hydrochlorothiazide (HYDRODIURIL) 25 MG tablet, Take  1 tablet (25 mg total) by mouth daily., Disp: 90 tablet, Rfl: 1   ibuprofen (ADVIL) 800 MG tablet, Take 1 tablet (800 mg total) by mouth 3 (three) times daily for 10 days., Disp: 30 tablet, Rfl: 2   Insulin Syringe-Needle U-100 (SAFETY INSULIN SYRINGES) 30G X 1/2" 1 ML MISC, Use once weekly as directed, Disp: 90 each, Rfl: 0   ipratropium-albuterol (DUONEB) 0.5-2.5 (3) MG/3ML SOLN, Inhale 1 vial via nebulizer every 4 hours if needed., Disp: 360 mL, Rfl: 12   levalbuterol (XOPENEX HFA) 45 MCG/ACT inhaler, Inhale 2 puffs into the lungs every 4 (four) hours., Disp: 15 g, Rfl: 9   levalbuterol (XOPENEX) 1.25 MG/0.5ML nebulizer solution, Inhale 1.25 mg into the lungs via nebulizer 3 (three) times daily., Disp: 45 mL, Rfl: 6   levalbuterol (XOPENEX) 1.25 MG/0.5ML nebulizer solution, Inhale 1 vial via nebulizer 3 times a day, Disp: 45 mL, Rfl: 6   levocetirizine (XYZAL) 5 MG tablet, Take 1 tablet (5 mg total) by mouth every evening., Disp: 30 tablet, Rfl: 5    LORazepam (ATIVAN) 1 MG tablet, Take 1 tablet (1 mg total) by mouth every 8 (eight) hours as needed for anxiety, Disp: 30 tablet, Rfl: 5   losartan (COZAAR) 25 MG tablet, Take 1 tablet (25 mg total) by mouth at bedtime., Disp: 90 tablet, Rfl: 3   montelukast (SINGULAIR) 10 MG tablet, Take 1 tablet (10 mg total) by mouth at bedtime., Disp: 30 tablet, Rfl: 10   olopatadine (PATANOL) 0.1 % ophthalmic solution, INSTILL 1 DROP INTO BOTH EYES TWO TIMES DAILY AS NEEDED FOR ALLERGIES, Disp: 5 mL, Rfl: 5   ondansetron (ZOFRAN) 4 MG tablet, Take 1 tablet (4 mg total) by mouth every 6 (six) hours as needed for nausea or vomiting., Disp: 12 tablet, Rfl: 0   ondansetron (ZOFRAN-ODT) 4 MG disintegrating tablet, Dissolve 1 tablet (4 mg total) by mouth every 8 (eight) hours as needed for nausea or vomiting., Disp: 20 tablet, Rfl: 1   Semaglutide, 2 MG/DOSE, (OZEMPIC, 2 MG/DOSE,) 8 MG/3ML SOPN, Inject 2 mg into the skin once a week as directed, Disp: 3 mL, Rfl: 0   Spacer/Aero-Holding Chambers (AEROCHAMBER MV) inhaler, Use as instructed, Disp: 1 each, Rfl: 0   traMADol (ULTRAM) 50 MG tablet, Take 1 tablet (50 mg total) by mouth every 6 (six) hours., Disp: 30 tablet, Rfl: 0   TRANSDERM-SCOP, 1.5 MG, 1 MG/3DAYS, APPLY 1 PATCH ONTO THE SKIN EVERY 3 DAYS., Disp: 10 patch, Rfl: 12   Vitamin D, Ergocalciferol, (DRISDOL) 1.25 MG (50000 UNIT) CAPS capsule, Take 1 capsule by mouth every 7 days., Disp: 5 capsule, Rfl: 0  Observations/Objective: Patient is well-developed, well-nourished in no acute distress.  Resting comfortably at home.  Head is normocephalic, atraumatic.  No labored breathing. Speech is clear and coherent with logical content.  Patient is alert and oriented at baseline.   Assessment and Plan: 1. COVID-19 - benzonatate (TESSALON) 100 MG capsule; Take 1 capsule (100 mg total) by mouth 3 (three) times daily as needed for cough.  Dispense: 30 capsule; Refill: 0 - nirmatrelvir/ritonavir, renal dosing,  (PAXLOVID) 10 x 150 MG & 10 x 100MG  TABS; Take 2 tablets by mouth 2 (two) times daily for 5 days. (Take nirmatrelvir 150 mg one tablet twice daily for 5 days and ritonavir 100 mg one tablet twice daily for 5 days) Patient GFR is 52  Dispense: 20 tablet; Refill: 0  Patient with multiple risk factors for complicated course of illness. Discussed risks/benefits of antiviral  medications including most common potential ADRs. Patient voiced understanding and would like to proceed with antiviral medication. They are candidate for Paxlovid (renal dosing). Rx sent to pharmacy. Supportive measures, OTC medications and vitamin regimen reviewed. Tessalon per orders. Quarantine reviewed in detail. Strict ER precautions discussed with patient.    Follow Up Instructions: I discussed the assessment and treatment plan with the patient. The patient was provided an opportunity to ask questions and all were answered. The patient agreed with the plan and demonstrated an understanding of the instructions.  A copy of instructions were sent to the patient via MyChart unless otherwise noted below.   The patient was advised to call back or seek an in-person evaluation if the symptoms worsen or if the condition fails to improve as anticipated.    Gina Climes, PA-C

## 2023-02-11 ENCOUNTER — Telehealth: Payer: Self-pay | Admitting: Internal Medicine

## 2023-02-11 ENCOUNTER — Other Ambulatory Visit (HOSPITAL_COMMUNITY): Payer: Self-pay

## 2023-02-11 ENCOUNTER — Other Ambulatory Visit: Payer: Self-pay | Admitting: Internal Medicine

## 2023-02-11 ENCOUNTER — Telehealth: Payer: Commercial Managed Care - PPO | Admitting: Family Medicine

## 2023-02-11 ENCOUNTER — Ambulatory Visit
Admission: RE | Admit: 2023-02-11 | Discharge: 2023-02-11 | Disposition: A | Discharge status: Home / Self Care | Payer: Commercial Managed Care - PPO | Source: Ambulatory Visit | Attending: Family Medicine | Admitting: Family Medicine

## 2023-02-11 ENCOUNTER — Other Ambulatory Visit: Payer: Self-pay

## 2023-02-11 VITALS — BP 180/98 | HR 88 | Temp 98.3°F | Resp 16

## 2023-02-11 DIAGNOSIS — E559 Vitamin D deficiency, unspecified: Secondary | ICD-10-CM

## 2023-02-11 DIAGNOSIS — E1122 Type 2 diabetes mellitus with diabetic chronic kidney disease: Secondary | ICD-10-CM

## 2023-02-11 DIAGNOSIS — Z7985 Long-term (current) use of injectable non-insulin antidiabetic drugs: Secondary | ICD-10-CM

## 2023-02-11 DIAGNOSIS — E538 Deficiency of other specified B group vitamins: Secondary | ICD-10-CM | POA: Diagnosis not present

## 2023-02-11 DIAGNOSIS — N1831 Chronic kidney disease, stage 3a: Secondary | ICD-10-CM

## 2023-02-11 DIAGNOSIS — Z6841 Body Mass Index (BMI) 40.0 and over, adult: Secondary | ICD-10-CM

## 2023-02-11 DIAGNOSIS — U071 COVID-19: Secondary | ICD-10-CM

## 2023-02-11 DIAGNOSIS — J4551 Severe persistent asthma with (acute) exacerbation: Secondary | ICD-10-CM

## 2023-02-11 DIAGNOSIS — E119 Type 2 diabetes mellitus without complications: Secondary | ICD-10-CM

## 2023-02-11 MED ORDER — IPRATROPIUM-ALBUTEROL 0.5-2.5 (3) MG/3ML IN SOLN
RESPIRATORY_TRACT | 12 refills | Status: AC
  Filled 2023-02-11: qty 360, 20d supply, fill #0

## 2023-02-11 MED ORDER — HYDROCOD POLI-CHLORPHE POLI ER 10-8 MG/5ML PO SUER
5.0000 mL | Freq: Two times a day (BID) | ORAL | 0 refills | Status: DC | PRN
  Filled 2023-02-11: qty 115, 12d supply, fill #0

## 2023-02-11 MED ORDER — METHYLPREDNISOLONE SODIUM SUCC 125 MG IJ SOLR
80.0000 mg | Freq: Once | INTRAMUSCULAR | Status: AC
  Administered 2023-02-11: 80 mg via INTRAMUSCULAR

## 2023-02-11 MED ORDER — PREDNISONE 10 MG PO TABS
ORAL_TABLET | ORAL | 0 refills | Status: AC
  Filled 2023-02-11: qty 20, 8d supply, fill #0

## 2023-02-11 MED ORDER — AZITHROMYCIN 250 MG PO TABS
ORAL_TABLET | ORAL | 0 refills | Status: AC
  Filled 2023-02-11: qty 6, 5d supply, fill #0
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
## 2023-02-11 NOTE — Telephone Encounter (Addendum)
Called and spoke to patient.  She stated that she had one positive and one negative  covid test on Saturday. She was seen at health at work this morning and tested. Still awaiting test results from health at work. C/o wheezing, chills, temp 103, sweats, prod cough with thick green sputum and increased SOB with exertion x3d. She has not been using duoneb. She is requesting refill.  She tried using albuterol HFA 4x without success and Symbicort BID.  Dr. Maple Hudson, please advise.   Current Outpatient Medications on File Prior to Visit  Medication Sig Dispense Refill   albuterol (VENTOLIN HFA) 108 (90 Base) MCG/ACT inhaler Inhale 2 puffs into the lungs every 6 hours as needed for wheezing 18 g 12   ALPRAZolam (XANAX) 1 MG tablet take 1/2 tablets by mouth every 8 hours as needed for anxiety and 30 mins before flight 10 days 15 tablet 0   Azelastine-Fluticasone (DYMISTA) 137-50 MCG/ACT SUSP Use 1 spray in each nostril twice daily as directed 23 g 5   benzonatate (TESSALON) 100 MG capsule Take 1 capsule (100 mg total) by mouth 3 (three) times daily as needed for cough. 30 capsule 0   budesonide-formoterol (SYMBICORT) 160-4.5 MCG/ACT inhaler Inhale 2 puff(s) into the lungs 2 times daily. 10.2 g 9   chlorpheniramine-HYDROcodone (TUSSIONEX) 10-8 MG/5ML Take 5 mLs by mouth every 12 (twelve) hours as needed for cough. 115 mL 0   Cholecalciferol (D-3-5) 125 MCG (5000 UT) capsule Take 1 capsule (5,000 Units total) by mouth daily. 90 capsule 1   cyanocobalamin (VITAMIN B12) 1000 MCG tablet Take 1 tablet (1,000 mcg total) by mouth daily. 90 tablet 1   cyanocobalamin (VITAMIN B12) 1000 MCG/ML injection Inject 1 mL (1,000 mcg total) into the muscle once a week. 10 mL 0   cyclobenzaprine (FLEXERIL) 10 MG tablet Take 1 tablet (10 mg total) by mouth 2 (two) times daily as needed for muscle spasms. 20 tablet 0   diltiazem (TIAZAC) 360 MG 24 hr capsule Take 1 capsule (360 mg total) by mouth daily. 90 capsule 1    EPINEPHrine 0.3 mg/0.3 mL IJ SOAJ injection Inject into thigh as directed for severe allergic reaction/asthma. 2 each 12   esomeprazole (NEXIUM) 40 MG capsule Take 1 capsule (40 mg total) by mouth every morning. 30 capsule 5   furosemide (LASIX) 40 MG tablet TAKE 1 TABLET BY MOUTH ONCE DAILY AS NEEDED (Patient taking differently: Take 40 mg by mouth daily as needed for fluid.) 30 tablet 5   hydrochlorothiazide (HYDRODIURIL) 25 MG tablet Take 1 tablet (25 mg total) by mouth daily. 90 tablet 1   ibuprofen (ADVIL) 800 MG tablet Take 1 tablet (800 mg total) by mouth 3 (three) times daily for 10 days. 30 tablet 2   Insulin Syringe-Needle U-100 (SAFETY INSULIN SYRINGES) 30G X 1/2" 1 ML MISC Use once weekly as directed 90 each 0   ipratropium-albuterol (DUONEB) 0.5-2.5 (3) MG/3ML SOLN Inhale 1 vial via nebulizer every 4 hours if needed. 360 mL 12   levalbuterol (XOPENEX HFA) 45 MCG/ACT inhaler Inhale 2 puffs into the lungs every 4 (four) hours. 15 g 9   levalbuterol (XOPENEX) 1.25 MG/0.5ML nebulizer solution Inhale 1.25 mg into the lungs via nebulizer 3 (three) times daily. 45 mL 6   levalbuterol (XOPENEX) 1.25 MG/0.5ML nebulizer solution Inhale 1 vial via nebulizer 3 times a day 45 mL 6   levocetirizine (XYZAL) 5 MG tablet Take 1 tablet (5 mg total) by mouth every evening. 30 tablet 5  LORazepam (ATIVAN) 1 MG tablet Take 1 tablet (1 mg total) by mouth every 8 (eight) hours as needed for anxiety 30 tablet 5   losartan (COZAAR) 25 MG tablet Take 1 tablet (25 mg total) by mouth at bedtime. 90 tablet 3   montelukast (SINGULAIR) 10 MG tablet Take 1 tablet (10 mg total) by mouth at bedtime. 30 tablet 10   nirmatrelvir/ritonavir, renal dosing, (PAXLOVID) 10 x 150 MG & 10 x 100MG  TABS Take 2 tablets by mouth 2 (two) times daily for 5 days. (Take nirmatrelvir 150 mg one tablet twice daily for 5 days and ritonavir 100 mg one tablet twice daily for 5 days) Patient GFR is 52 20 tablet 0   olopatadine (PATANOL) 0.1 %  ophthalmic solution INSTILL 1 DROP INTO BOTH EYES TWO TIMES DAILY AS NEEDED FOR ALLERGIES 5 mL 5   ondansetron (ZOFRAN) 4 MG tablet Take 1 tablet (4 mg total) by mouth every 6 (six) hours as needed for nausea or vomiting. 12 tablet 0   ondansetron (ZOFRAN-ODT) 4 MG disintegrating tablet Dissolve 1 tablet (4 mg total) by mouth every 8 (eight) hours as needed for nausea or vomiting. 20 tablet 1   Semaglutide, 2 MG/DOSE, (OZEMPIC, 2 MG/DOSE,) 8 MG/3ML SOPN Inject 2 mg into the skin once a week as directed 3 mL 0   Spacer/Aero-Holding Chambers (AEROCHAMBER MV) inhaler Use as instructed 1 each 0   traMADol (ULTRAM) 50 MG tablet Take 1 tablet (50 mg total) by mouth every 6 (six) hours. 30 tablet 0   TRANSDERM-SCOP, 1.5 MG, 1 MG/3DAYS APPLY 1 PATCH ONTO THE SKIN EVERY 3 DAYS. 10 patch 12   Vitamin D, Ergocalciferol, (DRISDOL) 1.25 MG (50000 UNIT) CAPS capsule Take 1 capsule by mouth every 7 days. 5 capsule 0   No current facility-administered medications on file prior to visit.    Allergies  Allergen Reactions   Influenza A (H1n1) Monoval Vac     Reaction: systemic reaction   Omalizumab     Geoffry Paradise* REACTION: angioedema-looses airway   Promethazine Hcl     REACTION: hallucinations with too high of dose   Topiramate

## 2023-02-11 NOTE — Telephone Encounter (Signed)
Sorry for interruption- suggest we sent prednisone 10 mg, # 20      4 X 2 DAYS, 3 X 2 DAYS, 2 X 2 DAYS, 1 X 2 DAYS  And Zpak 250 mg, # 6  2 today then one daily  If she reports back positive Covid then send  standard Paxlovid xx 5 days

## 2023-02-11 NOTE — Telephone Encounter (Signed)
Also needs Duoneb solution.

## 2023-02-11 NOTE — Assessment & Plan Note (Signed)
Reviewed lab results from last visit.  Her vitamin B12 level was high.  She has switched her B12 injections to monthly, started in August.  She denies feeling poorly, paresthesias or extreme fatigue.  Continue B12 monthly injections.  Recheck level in 4 months

## 2023-02-11 NOTE — Assessment & Plan Note (Signed)
Lab Results  Component Value Date   HGBA1C 6.2 (H) 01/07/2023   Reviewed lab results from last visit with patient.  Her A1c increased from 6-6.2.  She has been taking Ozempic 2 mg once weekly injection with improved weight reduction, improved satiety but has plateaued.  Due to her increase in A1c and lack of weight loss in the last several months, we discussed potentially changing her over to [facility] next visit.  She will continue working on her prescribed diet, reducing added sugar, candy intake and plan to increase physical activity once her COVID-19 infection resolves.

## 2023-02-11 NOTE — Telephone Encounter (Signed)
Patient is aware of below message and voiced her understanding.  Nothing further needed.   

## 2023-02-11 NOTE — Telephone Encounter (Signed)
Patient is aware of below message/recommendations and voiced her understanding. Prednisone and azpak sent to preferred pharmacy. She stated that she did a mychart acute visit on Saturday and was started on paxlovid.  She is now requesting a refill on tussinex.   Dr. Maple Hudson, please advise. Thanks

## 2023-02-11 NOTE — Progress Notes (Signed)
Office: [phone]  /  Fax: [phone]  WEIGHT SUMMARY AND BIOMETRICS  No data recorded No data recorded  No data recorded  Vitals Temp: 98.3 F (36.8 C) BP: (!) 180/98 Pulse Rate: 88 SpO2: 99 %  BP self reported No data recorded   HPI  Chief Complaint: OBESITY  [name] is here to discuss her progress with her obesity treatment plan. She is on the practicing portion control and making smarter food choices, such as increasing vegetables and decreasing simple carbohydrates and states she is following her eating plan approximately 70 % of the time. She states she is exercising 0 minutes 0 times per week.   Interval History:  Since last office visit she self reports a 5 pound weight loss She was previously on category 3 meal plan, venturing off most days She remains on Ozempic 2 mg once weekly injection for concurrent type 2 diabetes with some improved satiety and without adverse side effect She has Zofran on hand to use as needed nausea She has been working on meal prep She has cut back on ice cream consumption but continues to snack on candy Exercise has been limited, currently sick with COVID-19 and an asthma exacerbation Received Solu-Medrol injection this week and is on oral prednisone  Pharmacotherapy: Ozempic 2 mg once weekly injection  PHYSICAL EXAM:  There were no vitals taken for this visit. There is no height or weight on file to calculate BMI.  Psych-normal affect   ASSESSMENT AND PLAN  TREATMENT PLAN FOR OBESITY:  Recommended Dietary Goals  [name] is currently in the action stage of change. As such, her goal is to continue weight management plan. She has agreed to practicing portion control and making smarter food choices, such as increasing vegetables and decreasing simple carbohydrates.  Behavioral Intervention  We discussed the following Behavioral Modification Strategies today: Yes okay [id number] both my mom and my sister 52 she was 48 and she  passed away at she was 24 at diagnosis 60 from colon cancer note note note note note.  Additional resources provided today: NA  Recommended Physical Activity Goals  [name] has been advised to work up to 150 minutes of moderate intensity aerobic activity a week and strengthening exercises 2-3 times per week for cardiovascular health, weight loss maintenance and preservation of muscle mass.   She has agreed to Think about enjoyable ways to increase daily physical activity and overcoming barriers to exercise and Increase physical activity in their day and reduce sedentary time (increase NEAT).  Pharmacotherapy changes for the treatment of obesity: none (consider change to [facility] next visit)  ASSOCIATED CONDITIONS ADDRESSED TODAY  Type 2 diabetes mellitus without complication, without long-term current use of insulin ([name]) Assessment & Plan: Lab Results  Component Value Date   HGBA1C 6.2 (H) 01/07/2023   Reviewed lab results from last visit with patient.  Her A1c increased from 6-6.2.  She has been taking Ozempic 2 mg once weekly injection with improved weight reduction, improved satiety but has plateaued.  Due to her increase in A1c and lack of weight loss in the last several months, we discussed potentially changing her over to [facility] next visit.  She will continue working on her prescribed diet, reducing added sugar, candy intake and plan to increase physical activity once her COVID-19 infection resolves.   Morbid obesity ([name]) with starting BMI 40  BMI 40.0-44.9, adult ([name])  Stage 3a chronic kidney disease ([name]) Assessment & Plan: She was referred to nephrology and completed this appointment.  She reports starting losartan once daily and has been tolerating this well.  Her kidney function has been stable.  She avoids use of NSAIDs.  Keep upcoming follow-up visit as scheduled with nephrology.   Vitamin D deficiency Assessment & Plan: Reviewed lab from last visit.  Her vitamin  D level has improved from 23-36 with a goal over 50.  She has been taking prescription vitamin D once weekly.  Her energy level is slowly improving.  Continue prescription vitamin D 50,000 IU once weekly.  Recheck level in 4 months   B12 deficiency Assessment & Plan: Reviewed lab results from last visit.  Her vitamin B12 level was high.  She has switched her B12 injections to monthly, started in August.  She denies feeling poorly, paresthesias or extreme fatigue.  Continue B12 monthly injections.  Recheck level in 4 months       She was informed of the importance of frequent follow up visits to maximize her success with intensive lifestyle modifications for her multiple health conditions.   ATTESTASTION STATEMENTS:  Reviewed by clinician on day of visit: allergies, medications, problem list, medical history, surgical history, family history, social history, and previous encounter notes pertinent to obesity diagnosis.   I have personally spent 30 minutes total time today in preparation, patient care, nutritional counseling and documentation for this visit, including the following: review of clinical lab tests; review of medical tests/procedures/services.      [name], DO DABFM, DABOM [facility] [address] [place], [place] [address] [phone]

## 2023-02-11 NOTE — Assessment & Plan Note (Signed)
Reviewed lab from last visit.  Her vitamin D level has improved from 23-36 with a goal over 50.  She has been taking prescription vitamin D once weekly.  Her energy level is slowly improving.  Continue prescription vitamin D 50,000 IU once weekly.  Recheck level in 4 months

## 2023-02-11 NOTE — Assessment & Plan Note (Signed)
She was referred to nephrology and completed this appointment.  She reports starting losartan once daily and has been tolerating this well.  Her kidney function has been stable.  She avoids use of NSAIDs.  Keep upcoming follow-up visit as scheduled with nephrology.

## 2023-02-11 NOTE — Telephone Encounter (Signed)
Tussionex refilled at [facility]

## 2023-02-11 NOTE — Telephone Encounter (Signed)
Suggest we send prednisone 10 mg, # 20, 4 X 2 DAYS, 3 X 2 DAYS, 2 X 2 DAYS, 1 X 2 DAYS                     And

## 2023-02-11 NOTE — ED Triage Notes (Addendum)
Tested positive for covid Saturday. Burning throat, fever, cough since Saturday. Started paxlovid on Saturday.

## 2023-02-11 NOTE — Discharge Instructions (Signed)
Your doctor has sent a prescription for azithromycin, Tussionex, and prednisone to the pharmacy We have given you a steroid shot Continue Paxlovid 2 times a day until gone Follow-up with your pulmonary doctor

## 2023-02-11 NOTE — Telephone Encounter (Signed)
Patient thinks she might have Covid and would like some steroids, tessinex and an antibiotic called in for her.  Pharmacy [facility]

## 2023-02-12 ENCOUNTER — Other Ambulatory Visit (HOSPITAL_COMMUNITY): Payer: Self-pay

## 2023-02-14 ENCOUNTER — Other Ambulatory Visit (HOSPITAL_BASED_OUTPATIENT_CLINIC_OR_DEPARTMENT_OTHER): Payer: Self-pay

## 2023-02-14 ENCOUNTER — Other Ambulatory Visit (HOSPITAL_COMMUNITY): Payer: Self-pay

## 2023-02-14 ENCOUNTER — Other Ambulatory Visit: Payer: Self-pay | Admitting: Family Medicine

## 2023-02-14 ENCOUNTER — Other Ambulatory Visit (INDEPENDENT_AMBULATORY_CARE_PROVIDER_SITE_OTHER): Payer: Self-pay | Admitting: Family Medicine

## 2023-02-14 DIAGNOSIS — E119 Type 2 diabetes mellitus without complications: Secondary | ICD-10-CM

## 2023-02-18 ENCOUNTER — Other Ambulatory Visit (HOSPITAL_COMMUNITY): Payer: Self-pay

## 2023-02-18 MED ORDER — ONDANSETRON HCL 4 MG PO TABS
4.0000 mg | ORAL_TABLET | Freq: Four times a day (QID) | ORAL | 0 refills | Status: DC | PRN
  Filled 2023-02-18: qty 12, 3d supply, fill #0

## 2023-03-01 ENCOUNTER — Other Ambulatory Visit (HOSPITAL_COMMUNITY): Payer: Self-pay

## 2023-03-04 ENCOUNTER — Other Ambulatory Visit (HOSPITAL_COMMUNITY): Payer: Self-pay

## 2023-03-05 ENCOUNTER — Other Ambulatory Visit (INDEPENDENT_AMBULATORY_CARE_PROVIDER_SITE_OTHER): Payer: Self-pay | Admitting: Family Medicine

## 2023-03-05 ENCOUNTER — Other Ambulatory Visit (HOSPITAL_COMMUNITY): Payer: Self-pay

## 2023-03-05 ENCOUNTER — Other Ambulatory Visit: Payer: Self-pay | Admitting: Medical Genetics

## 2023-03-05 DIAGNOSIS — Z006 Encounter for examination for normal comparison and control in clinical research program: Secondary | ICD-10-CM

## 2023-03-05 DIAGNOSIS — E119 Type 2 diabetes mellitus without complications: Secondary | ICD-10-CM

## 2023-03-06 ENCOUNTER — Other Ambulatory Visit: Payer: Self-pay

## 2023-03-10 ENCOUNTER — Other Ambulatory Visit: Payer: Self-pay

## 2023-03-10 ENCOUNTER — Emergency Department (HOSPITAL_COMMUNITY): Payer: Commercial Managed Care - PPO

## 2023-03-10 ENCOUNTER — Emergency Department (HOSPITAL_COMMUNITY)
Admission: EM | Admit: 2023-03-10 | Discharge: 2023-03-10 | Disposition: A | Discharge status: Home / Self Care | Payer: Commercial Managed Care - PPO | Attending: Emergency Medicine | Admitting: Emergency Medicine

## 2023-03-10 ENCOUNTER — Encounter (HOSPITAL_COMMUNITY): Payer: Self-pay

## 2023-03-10 DIAGNOSIS — Z7951 Long term (current) use of inhaled steroids: Secondary | ICD-10-CM | POA: Diagnosis not present

## 2023-03-10 DIAGNOSIS — I129 Hypertensive chronic kidney disease with stage 1 through stage 4 chronic kidney disease, or unspecified chronic kidney disease: Secondary | ICD-10-CM | POA: Insufficient documentation

## 2023-03-10 DIAGNOSIS — J90 Pleural effusion, not elsewhere classified: Secondary | ICD-10-CM | POA: Diagnosis not present

## 2023-03-10 DIAGNOSIS — Z79899 Other long term (current) drug therapy: Secondary | ICD-10-CM | POA: Diagnosis not present

## 2023-03-10 DIAGNOSIS — R918 Other nonspecific abnormal finding of lung field: Secondary | ICD-10-CM | POA: Diagnosis not present

## 2023-03-10 DIAGNOSIS — Z794 Long term (current) use of insulin: Secondary | ICD-10-CM | POA: Diagnosis not present

## 2023-03-10 DIAGNOSIS — E1122 Type 2 diabetes mellitus with diabetic chronic kidney disease: Secondary | ICD-10-CM | POA: Diagnosis not present

## 2023-03-10 DIAGNOSIS — N189 Chronic kidney disease, unspecified: Secondary | ICD-10-CM | POA: Diagnosis not present

## 2023-03-10 DIAGNOSIS — R0602 Shortness of breath: Secondary | ICD-10-CM | POA: Diagnosis not present

## 2023-03-10 DIAGNOSIS — J45901 Unspecified asthma with (acute) exacerbation: Secondary | ICD-10-CM | POA: Diagnosis not present

## 2023-03-10 DIAGNOSIS — R0989 Other specified symptoms and signs involving the circulatory and respiratory systems: Secondary | ICD-10-CM | POA: Diagnosis not present

## 2023-03-10 MED ORDER — IPRATROPIUM BROMIDE 0.02 % IN SOLN
0.5000 mg | Freq: Once | RESPIRATORY_TRACT | Status: AC
  Administered 2023-03-10: 0.5 mg via RESPIRATORY_TRACT
  Filled 2023-03-10: qty 2.5

## 2023-03-10 MED ORDER — PREDNISONE 10 MG PO TABS
50.0000 mg | ORAL_TABLET | Freq: Every day | ORAL | 0 refills | Status: DC
  Filled 2023-03-10: qty 20, 4d supply, fill #0

## 2023-03-10 MED ORDER — GUAIFENESIN 100 MG/5ML PO LIQD
100.0000 mg | ORAL | 0 refills | Status: DC | PRN
  Filled 2023-03-10: qty 60, 1d supply, fill #0

## 2023-03-10 MED ORDER — MAGNESIUM SULFATE 2 GM/50ML IV SOLN
2.0000 g | Freq: Once | INTRAVENOUS | Status: AC
  Administered 2023-03-10: 2 g via INTRAVENOUS
  Filled 2023-03-10: qty 50

## 2023-03-10 MED ORDER — GUAIFENESIN-CODEINE 100-10 MG/5ML PO SOLN
10.0000 mL | Freq: Once | ORAL | Status: AC
  Administered 2023-03-10: 10 mL via ORAL
  Filled 2023-03-10: qty 10

## 2023-03-10 MED ORDER — ALBUTEROL SULFATE (2.5 MG/3ML) 0.083% IN NEBU
5.0000 mg | INHALATION_SOLUTION | Freq: Once | RESPIRATORY_TRACT | Status: AC
  Administered 2023-03-10: 5 mg via RESPIRATORY_TRACT
  Filled 2023-03-10: qty 6

## 2023-03-10 MED ORDER — METHYLPREDNISOLONE SODIUM SUCC 125 MG IJ SOLR
125.0000 mg | Freq: Once | INTRAMUSCULAR | Status: AC
  Administered 2023-03-10: 125 mg via INTRAVENOUS
  Filled 2023-03-10: qty 2

## 2023-03-10 NOTE — ED Provider Notes (Signed)
[place] EMERGENCY DEPARTMENT AT [facility] Provider Note   CSN: [id number] Arrival date & time: 03/10/23  1519     History  Chief Complaint  Patient presents with   Shortness of Breath    [name] is a 57 y.o. female.  HPI    58 year old female comes in with chief complaint of shortness of breath.  Patient has previous history of asthma and a recent diagnosis of COVID-19 in October.  There is recorded history of hypertension, diabetes, CKD but no known cardiac disease history.  Patient indicates that she started feeling short of breath yesterday.  Symptoms however have started worsening as the day progressed.  Symptoms are described as cough, shortness of breath, wheezing.  She feels that her symptoms are consistent with asthma, but this asthma attack feels a little different.  Patient has not had any history of PE in the past.  She had fully recovered from COVID-19 after receiving Paxlovid.  Home Medications Prior to Admission medications   Medication Sig Start Date End Date Taking? Authorizing Provider  predniSONE (DELTASONE) 10 MG tablet Take 5 tablets (50 mg total) by mouth daily. 03/11/23  Yes Derwood Kaplan, MD  albuterol (VENTOLIN HFA) 108 (90 Base) MCG/ACT inhaler Inhale 2 puffs into the lungs every 6 hours as needed for wheezing 06/29/21   Jetty Duhamel D, MD  Azelastine-Fluticasone [facility]) 137-50 MCG/ACT SUSP Use 1 spray in each nostril twice daily as directed 06/29/21   Waymon Budge, MD  benzonatate (TESSALON) 100 MG capsule Take 1 capsule (100 mg total) by mouth 3 (three) times daily as needed for cough. 02/09/23   Waldon Merl, PA-C  budesonide-formoterol [facility]) 160-4.5 MCG/ACT inhaler Inhale 2 puff(s) into the lungs 2 times daily. 05/28/22     chlorpheniramine-HYDROcodone (TUSSIONEX) 10-8 MG/5ML Take 5 mLs by mouth every 12 (twelve) hours as needed for cough. 02/11/23   Jetty Duhamel D, MD  Cholecalciferol (D-3-5) 125 MCG (5000 UT)  capsule Take 1 capsule (5,000 Units total) by mouth daily. 12/10/22     cyanocobalamin (VITAMIN B12) 1000 MCG tablet Take 1 tablet (1,000 mcg total) by mouth daily. 12/10/22     cyclobenzaprine (FLEXERIL) 10 MG tablet Take 1 tablet (10 mg total) by mouth 2 (two) times daily as needed for muscle spasms. 11/08/22   Al Decant, PA-C  EPINEPHrine 0.3 mg/0.3 mL IJ SOAJ injection Inject into thigh as directed for severe allergic reaction/asthma. 06/29/21   Waymon Budge, MD  esomeprazole (NEXIUM) 40 MG capsule Take 1 capsule (40 mg total) by mouth every morning. 12/18/22   Jetty Duhamel D, MD  furosemide (LASIX) 40 MG tablet TAKE 1 TABLET BY MOUTH ONCE DAILY AS NEEDED Patient taking differently: Take 40 mg by mouth daily as needed for fluid. 07/24/16   Jetty Duhamel D, MD  hydrochlorothiazide (HYDRODIURIL) 25 MG tablet Take 1 tablet (25 mg total) by mouth daily. 12/10/22     ibuprofen (ADVIL) 800 MG tablet Take 1 tablet (800 mg total) by mouth 3 (three) times daily for 10 days. 11/19/22     Insulin Syringe-Needle U-100 (SAFETY INSULIN SYRINGES) 30G X 1/2" 1 ML MISC Use once weekly as directed 09/13/22   Bowen, Scot Jun, DO  ipratropium-albuterol (DUONEB) 0.5-2.5 (3) MG/3ML SOLN Inhale 1 vial via nebulizer every 4 hours if needed. 02/11/23   Waymon Budge, MD  levalbuterol [facility] HFA) 45 MCG/ACT inhaler Inhale 2 puffs into the lungs every 4 (four) hours. 05/28/22     levalbuterol (  XOPENEX) 1.25 MG/0.5ML nebulizer solution Inhale 1.25 mg into the lungs via nebulizer 3 (three) times daily. 05/28/22   Jackie Plum, MD  levalbuterol [facility]) 1.25 MG/0.5ML nebulizer solution Inhale 1 vial via nebulizer 3 times a day 12/10/22     levocetirizine (XYZAL) 5 MG tablet Take 1 tablet (5 mg total) by mouth every evening. 06/29/21   Waymon Budge, MD  LORazepam (ATIVAN) 1 MG tablet Take 1 tablet (1 mg total) by mouth every 8 (eight) hours as needed for anxiety 10/05/22   Jetty Duhamel D, MD  losartan (COZAAR)  25 MG tablet Take 1 tablet (25 mg total) by mouth at bedtime. 01/02/23     montelukast (SINGULAIR) 10 MG tablet Take 1 tablet (10 mg total) by mouth at bedtime. 06/29/21   Young, Joni Fears D, MD  olopatadine (PATANOL) 0.1 % ophthalmic solution INSTILL 1 DROP INTO BOTH EYES TWO TIMES DAILY AS NEEDED FOR ALLERGIES 06/29/21   Waymon Budge, MD  ondansetron (ZOFRAN) 4 MG tablet Take 1 tablet (4 mg total) by mouth every 6 (six) hours as needed for nausea or vomiting. 02/18/23   Bowen, Scot Jun, DO  ondansetron (ZOFRAN-ODT) 4 MG disintegrating tablet Dissolve 1 tablet (4 mg total) by mouth every 8 (eight) hours as needed for nausea or vomiting. 05/08/22   Bowen, Scot Jun, DO  Semaglutide, 2 MG/DOSE, (OZEMPIC, 2 MG/DOSE,) 8 MG/3ML SOPN Inject 2 mg into the skin once a week as directed 10/16/22   Bowen, Scot Jun, DO  Spacer/Aero-Holding Chambers (AEROCHAMBER MV) inhaler Use as instructed 08/28/13   Storm Frisk, MD  traMADol (ULTRAM) 50 MG tablet Take 1 tablet (50 mg total) by mouth every 6 (six) hours. 12/13/22     TRANSDERM-SCOP, 1.5 MG, 1 MG/3DAYS APPLY 1 PATCH ONTO THE SKIN EVERY 3 DAYS. 01/19/16   Waymon Budge, MD  Vitamin D, Ergocalciferol, (DRISDOL) 1.25 MG (50000 UNIT) CAPS capsule Take 1 capsule by mouth every 7 days. 10/16/22   Bowen, Scot Jun, DO      Allergies    Influenza a (h1n1) monoval vac, Omalizumab, and Topiramate    Review of Systems   Review of Systems  All other systems reviewed and are negative.   Physical Exam Updated Vital Signs BP (!) 157/103   Pulse 92   Temp 98.4 F (36.9 C) (Oral)   Resp (!) 24   Ht 5\' 3"  (1.6 m)   Wt 104.8 kg   SpO2 99%   BMI 40.92 kg/m  Physical Exam Vitals and nursing note reviewed.  Constitutional:      Appearance: She is well-developed.  Cardiovascular:     Rate and Rhythm: Normal rate.  Pulmonary:     Effort: Pulmonary effort is normal. Tachypnea present.     Breath sounds: Decreased breath sounds present. No wheezing.  Musculoskeletal:      Cervical back: Neck supple.  Skin:    General: Skin is warm and dry.  Neurological:     Mental Status: She is alert and oriented to person, place, and time.     ED Results / Procedures / Treatments   Labs (all labs ordered are listed, but only abnormal results are displayed) Labs Reviewed - No data to display  EKG EKG Interpretation Date/Time:  Sunday March 10 2023 15:26:40 EST Ventricular Rate:  89 PR Interval:  153 QRS Duration:  82 QT Interval:  388 QTC Calculation: 473 R Axis:   15  Text Interpretation: Sinus rhythm Probable left atrial enlargement Left ventricular  hypertrophy No acute changes No significant change since last tracing Confirmed by Derwood Kaplan [phone]) on 03/10/2023 5:16:08 PM  Radiology DG Chest Port 1 View  Result Date: 03/10/2023 CLINICAL DATA:  Several hour history of shortness of breath EXAM: PORTABLE CHEST 1 VIEW COMPARISON:  Chest radiograph dated 04/03/2021 FINDINGS: Low lung volumes. Bibasilar hazy and patchy opacities. Small bilateral pleural effusions. No pneumothorax. The heart size and mediastinal contours are within normal limits. No acute osseous abnormality. IMPRESSION: 1. Low lung volumes. Bibasilar hazy and patchy opacities, which may represent atelectasis, aspiration, or pneumonia. 2. Small bilateral pleural effusions. Electronically Signed   By: Agustin Cree M.D.   On: 03/10/2023 16:12    Procedures Procedures    Medications Ordered in ED Medications  albuterol (PROVENTIL) (2.5 MG/3ML) 0.083% nebulizer solution 5 mg (5 mg Nebulization Given 03/10/23 1633)  ipratropium (ATROVENT) nebulizer solution 0.5 mg (0.5 mg Nebulization Given 03/10/23 1634)  magnesium sulfate IVPB 2 g 50 mL (0 g Intravenous Stopped 03/10/23 1704)  methylPREDNISolone sodium succinate (SOLU-MEDROL) 125 mg/2 mL injection 125 mg (125 mg Intravenous Given 03/10/23 1603)  guaiFENesin-codeine 100-10 MG/5ML solution 10 mL (10 mLs Oral Given 03/10/23 1603)    ED  Course/ Medical Decision Making/ A&P                                 Medical Decision Making Amount and/or Complexity of Data Reviewed Radiology: ordered.  Risk OTC drugs. Prescription drug management.   This patient presents to the ED with chief complaint(s) of shortness of breath with pertinent past medical history of asthma, recent COVID-19 on 10-21, CKD and no cardiac disease history.The complaint involves an extensive differential diagnosis and also carries with it a high risk of complications and morbidity.    The differential diagnosis includes acute asthma exacerbation, rebound COVID-19, pulm embolism, bronchospastic cough, viral URI.  The initial plan is to get patient hour-long nebulizer treatment.  Along with that we will give her cough medicine and also IV magnesium and Solu-Medrol. Patient does not have any significant wheezing at this time.  She is tachypneic.   Additional history obtained: Records reviewed pulmonary visit on 12-2021.  Currently on Symbicort and Singulair.  Patient was diagnosed with moderate, persistent asthma with exacerbation at that time.   Independent visualization and interpretation of imaging: - I independently visualized the following imaging with scope of interpretation limited to determining acute life threatening conditions related to emergency care: xray chest , which revealed no clear evidence of pneumonia.  I reviewed the radiology read, they are questioning if patient has opacity.  I have examined the patient on 2 separate occasion, I do not have any focal rhonchorous breath sounds.  Clinically, as far as timeline is concerned I am not also concerned about pneumonia.  Treatment and Reassessment: Patient reassessed. Her wheezing is resolved.  She is feeling a lot better.  She is able to complete full sentences without having to stop.  Will prescribe prednisone.  She is comfortable with the discharge plan.  Final Clinical Impression(s) / ED  Diagnoses Final diagnoses:  Asthma with acute exacerbation, unspecified asthma severity, unspecified whether persistent    Rx / DC Orders ED Discharge Orders          Ordered    predniSONE (DELTASONE) 10 MG tablet  Daily        03/10/23 1826  Derwood Kaplan, MD 03/10/23 [phone]

## 2023-03-10 NOTE — ED Triage Notes (Signed)
Patient has been short of breath "that has been building for the last few hours." Did a breathing treatment before arrival. Denies chest pain. Unable to speak in full sentences.

## 2023-03-10 NOTE — Discharge Instructions (Signed)
We saw you in the ER for your asthma related complains. We gave you some breathing treatments in the ER, and seems like your symptoms have improved. Please take albuterol as needed every 4 hours. Please take the medications prescribed. Please refrain from smoking or smoke exposure. Please see a primary care doctor in 1 week. Return to the ER if your symptoms worsen.  

## 2023-03-11 ENCOUNTER — Ambulatory Visit: Payer: Commercial Managed Care - PPO

## 2023-03-11 ENCOUNTER — Ambulatory Visit: Payer: Commercial Managed Care - PPO | Admitting: Family Medicine

## 2023-03-11 ENCOUNTER — Other Ambulatory Visit: Payer: Self-pay

## 2023-03-11 ENCOUNTER — Other Ambulatory Visit (HOSPITAL_COMMUNITY): Payer: Self-pay

## 2023-03-14 ENCOUNTER — Other Ambulatory Visit (HOSPITAL_COMMUNITY): Payer: Self-pay

## 2023-03-14 ENCOUNTER — Ambulatory Visit: Payer: Commercial Managed Care - PPO | Admitting: Family Medicine

## 2023-03-14 ENCOUNTER — Telehealth: Payer: Self-pay | Admitting: *Deleted

## 2023-03-14 VITALS — BP 137/83 | HR 92 | Ht 63.0 in | Wt 250.0 lb

## 2023-03-14 DIAGNOSIS — N1831 Chronic kidney disease, stage 3a: Secondary | ICD-10-CM

## 2023-03-14 DIAGNOSIS — Z7985 Long-term (current) use of injectable non-insulin antidiabetic drugs: Secondary | ICD-10-CM | POA: Diagnosis not present

## 2023-03-14 DIAGNOSIS — E119 Type 2 diabetes mellitus without complications: Secondary | ICD-10-CM | POA: Diagnosis not present

## 2023-03-14 DIAGNOSIS — E66813 Obesity, class 3: Secondary | ICD-10-CM

## 2023-03-14 DIAGNOSIS — R110 Nausea: Secondary | ICD-10-CM | POA: Diagnosis not present

## 2023-03-14 DIAGNOSIS — Z6841 Body Mass Index (BMI) 40.0 and over, adult: Secondary | ICD-10-CM | POA: Diagnosis not present

## 2023-03-14 DIAGNOSIS — J4541 Moderate persistent asthma with (acute) exacerbation: Secondary | ICD-10-CM

## 2023-03-14 DIAGNOSIS — E559 Vitamin D deficiency, unspecified: Secondary | ICD-10-CM | POA: Diagnosis not present

## 2023-03-14 MED ORDER — TIRZEPATIDE 7.5 MG/0.5ML ~~LOC~~ SOAJ
7.5000 mg | SUBCUTANEOUS | 0 refills | Status: DC
  Filled 2023-03-14: qty 2, 28d supply, fill #0

## 2023-03-14 MED ORDER — ONDANSETRON 4 MG PO TBDP
4.0000 mg | ORAL_TABLET | Freq: Three times a day (TID) | ORAL | 1 refills | Status: DC | PRN
  Filled 2023-03-14: qty 20, 7d supply, fill #0
  Filled 2023-04-29: qty 20, 7d supply, fill #1

## 2023-03-14 MED ORDER — VITAMIN D (ERGOCALCIFEROL) 1.25 MG (50000 UNIT) PO CAPS
50000.0000 [IU] | ORAL_CAPSULE | ORAL | 0 refills | Status: DC
  Filled 2023-03-14: qty 5, 35d supply, fill #0

## 2023-03-14 NOTE — Assessment & Plan Note (Signed)
Last vitamin D Lab Results  Component Value Date   VD25OH 36.0 01/07/2023   She remains on vitamin D 50,000 IU once weekly with a target vitamin D level over 50  Filled vitamin D 50,000 IU once weekly.  Recheck lab in the next 2 months

## 2023-03-14 NOTE — Assessment & Plan Note (Signed)
Nausea and she was back in the ED 11/17 with an acute asthma exacerbation.  She was given Solu-Medrol injection and sent home with a prednisone taper.  She is still feeling short of breath and having conversational dyspnea and wheezing.  She was resistant to being hospitalized on 11/17.  She is using her nebulizer machine at home on a consistent basis.  Her husband is home today.  She will go home and use her nebulizer machine.  She is to follow-up with Dr. Fannie Knee today for continued dyspnea despite prednisone use.

## 2023-03-14 NOTE — Assessment & Plan Note (Signed)
[name] has been without her Ozempic 2 mg once weekly injection for 3 weeks now.  [name] has not been monitoring her blood sugar readings but has been eating quite a bit of candy due to use of prednisone.  [name] has been having polyuria and fatigue.  Encouraged her to keep junk food snacks out of the house, improving her intake of lean protein and fiber with meals.  Monitor blood sugar readings at home.  Will change her over to Mounjaro 7.5 mg once weekly injection.  Contact PCP with updated blood sugar readings

## 2023-03-14 NOTE — Telephone Encounter (Signed)
Prior authorization done via cover my meds for patients Mounjaro. Waiting on determination.  

## 2023-03-14 NOTE — Progress Notes (Signed)
Office: [phone]  /  Fax: [phone]  WEIGHT SUMMARY AND BIOMETRICS  No data recorded No data recorded  Weight Lost Since Last Visit: 0lb   Vitals BP: 137/83 Pulse Rate: 92 SpO2: 99 %   Body Composition  Body Fat %: 50.5 % Fat Mass (lbs): 126.6 lbs Muscle Mass (lbs): 118 lbs Total Body Water (lbs): 96 lbs Visceral Fat Rating : 17    HPI  Chief Complaint: OBESITY  [name] is here to discuss her progress with her obesity treatment plan. She is on the the Category 3 Plan and states she is following her eating plan approximately 30 % of the time. She states she is exercising 0 minutes 0 times per week.   Interval History:  Since last office visit she is up 14 lb in the past 2 mos She is net up 20 lb in the past year She is on prednisone for asthma exacerbation, started 11/17 in the ED She is still having SOB Craving candy on Prednisone and is not checking blood sugars. Reflux worse during asthma exac Has not been physically active due to her asthma Retaining fluid more She has been off Ozempic for 3 weeks  Pharmacotherapy: None  PHYSICAL EXAM:  Blood pressure 137/83, pulse 92, height 5\' 3"  (1.6 m), weight 250 lb (113.4 kg), SpO2 99%. Body mass index is 44.29 kg/m.  General: She is overweight, cooperative, alert, well developed, and in no acute distress. PSYCH: Has normal mood, affect and thought process.   Lungs: Conversational dyspnea   ASSESSMENT AND PLAN  TREATMENT PLAN FOR OBESITY:  Recommended Dietary Goals  [name] is currently in the action stage of change. As such, her goal is to continue weight management plan. She has agreed to the Category 3 Plan.  Behavioral Intervention  We discussed the following Behavioral Modification Strategies today: increasing lean protein intake to established goals, increasing vegetables, increasing lower glycemic fruits, increasing water intake , keeping healthy foods at home, work on managing stress, creating  time for self-care and relaxation, avoiding temptations and identifying enticing environmental cues, and continue to work on maintaining a reduced calorie state, getting the recommended amount of protein, incorporating whole foods, making healthy choices, staying well hydrated and practicing mindfulness when eating..  Additional resources provided today: NA  Recommended Physical Activity Goals  [name] has been advised to work up to 150 minutes of moderate intensity aerobic activity a week and strengthening exercises 2-3 times per week for cardiovascular health, weight loss maintenance and preservation of muscle mass.   She has agreed to Think about enjoyable ways to increase daily physical activity and overcoming barriers to exercise and Increase physical activity in their day and reduce sedentary time (increase NEAT).  Pharmacotherapy changes for the treatment of obesity: Changed to Mounjaro 7.5 mg once weekly injection  ASSOCIATED CONDITIONS ADDRESSED TODAY  Type 2 diabetes mellitus without complication, without long-term current use of insulin ([name]) Assessment & Plan: She has been without her Ozempic 2 mg once weekly injection for 3 weeks now.  She has not been monitoring her blood sugar readings but has been eating quite a bit of candy due to use of prednisone.  She has been having polyuria and fatigue.  Encouraged her to keep junk food snacks out of the house, improving her intake of lean protein and fiber with meals.  Monitor blood sugar readings at home.  Will change her over to Mounjaro 7.5 mg once weekly injection.  Contact PCP with updated blood sugar readings  Orders: -     Tirzepatide; Inject 7.5 mg into the skin once a week.  Dispense: 2 mL; Refill: 0  Stage 3a chronic kidney disease ([name]) -     Tirzepatide; Inject 7.5 mg into the skin once a week.  Dispense: 2 mL; Refill: 0  Vitamin D deficiency Assessment & Plan: Last vitamin D Lab Results  Component Value Date   VD25OH  36.0 01/07/2023   She remains on vitamin D 50,000 IU once weekly with a target vitamin D level over 50  Filled vitamin D 50,000 IU once weekly.  Recheck lab in the next 2 months  Orders: -     Vitamin D (Ergocalciferol); Take 1 capsule (50,000 Units total) by mouth every 7 (seven) days.  Dispense: 5 capsule; Refill: 0  Class 3 severe obesity due to excess calories with serious comorbidity and body mass index (BMI) of 40.0 to 44.9 in adult ([name])  Moderate persistent asthma with exacerbation Assessment & Plan: Nausea and she was back in the ED 11/17 with an acute asthma exacerbation.  She was given Solu-Medrol injection and sent home with a prednisone taper.  She is still feeling short of breath and having conversational dyspnea and wheezing.  She was resistant to being hospitalized on 11/17.  She is using her nebulizer machine at home on a consistent basis.  Her husband is home today.  She will go home and use her nebulizer machine.  She is to follow-up with Dr. Fannie Knee today for continued dyspnea despite prednisone use.     Nausea -     Ondansetron; Dissolve 1 tablet (4 mg total) by mouth every 8 (eight) hours as needed for nausea or vomiting.  Dispense: 20 tablet; Refill: 1      She was informed of the importance of frequent follow up visits to maximize her success with intensive lifestyle modifications for her multiple health conditions.   ATTESTASTION STATEMENTS:  Reviewed by clinician on day of visit: allergies, medications, problem list, medical history, surgical history, family history, social history, and previous encounter notes pertinent to obesity diagnosis.   I have personally spent 30 minutes total time today in preparation, patient care, nutritional counseling and documentation for this visit, including the following: review of clinical lab tests; review of medical tests/procedures/services.      [name], DO DABFM, DABOM [facility] [address] [place], [place] [address] [phone]

## 2023-03-14 NOTE — Telephone Encounter (Signed)
Patients Mounjaro was approved.  Outcome Approved today by [employer] 2017 The request has been approved. The authorization is effective from 03/14/2023 to 03/13/2024, as long as the member is enrolled in their current health plan. This has been approved for a max daily dosage of 0.071. A written notification letter will follow with additional details.

## 2023-03-15 ENCOUNTER — Other Ambulatory Visit (HOSPITAL_COMMUNITY): Payer: Self-pay

## 2023-03-22 ENCOUNTER — Other Ambulatory Visit: Payer: Self-pay

## 2023-03-22 ENCOUNTER — Other Ambulatory Visit (HOSPITAL_COMMUNITY): Payer: Self-pay

## 2023-03-22 ENCOUNTER — Other Ambulatory Visit (HOSPITAL_COMMUNITY): Payer: Self-pay | Admitting: Emergency Medicine

## 2023-03-24 ENCOUNTER — Other Ambulatory Visit (HOSPITAL_COMMUNITY): Payer: Self-pay

## 2023-03-24 MED ORDER — TRAMADOL HCL 50 MG PO TABS
50.0000 mg | ORAL_TABLET | Freq: Four times a day (QID) | ORAL | 0 refills | Status: DC
  Filled 2023-03-24: qty 30, 8d supply, fill #0

## 2023-03-25 ENCOUNTER — Other Ambulatory Visit (HOSPITAL_COMMUNITY): Payer: Self-pay

## 2023-03-27 ENCOUNTER — Other Ambulatory Visit (HOSPITAL_COMMUNITY): Payer: Self-pay

## 2023-04-10 ENCOUNTER — Other Ambulatory Visit (HOSPITAL_COMMUNITY): Payer: Self-pay

## 2023-04-11 ENCOUNTER — Encounter: Payer: Self-pay | Admitting: Family Medicine

## 2023-04-11 ENCOUNTER — Other Ambulatory Visit (HOSPITAL_COMMUNITY): Payer: Self-pay

## 2023-04-11 ENCOUNTER — Ambulatory Visit: Payer: Commercial Managed Care - PPO | Admitting: Family Medicine

## 2023-04-11 VITALS — BP 159/93 | HR 85 | Ht 63.0 in | Wt 249.0 lb

## 2023-04-11 DIAGNOSIS — E119 Type 2 diabetes mellitus without complications: Secondary | ICD-10-CM

## 2023-04-11 DIAGNOSIS — Z7985 Long-term (current) use of injectable non-insulin antidiabetic drugs: Secondary | ICD-10-CM | POA: Diagnosis not present

## 2023-04-11 DIAGNOSIS — E66813 Obesity, class 3: Secondary | ICD-10-CM

## 2023-04-11 DIAGNOSIS — M170 Bilateral primary osteoarthritis of knee: Secondary | ICD-10-CM | POA: Diagnosis not present

## 2023-04-11 DIAGNOSIS — I10 Essential (primary) hypertension: Secondary | ICD-10-CM

## 2023-04-11 DIAGNOSIS — Z6841 Body Mass Index (BMI) 40.0 and over, adult: Secondary | ICD-10-CM | POA: Diagnosis not present

## 2023-04-11 MED ORDER — TIRZEPATIDE 10 MG/0.5ML ~~LOC~~ SOAJ
10.0000 mg | SUBCUTANEOUS | 1 refills | Status: DC
  Filled 2023-04-11: qty 2, 28d supply, fill #0
  Filled 2023-05-24: qty 2, 28d supply, fill #1

## 2023-04-11 NOTE — Patient Instructions (Addendum)
Breakfast options: 2 eggs + one carb serving (90-100 cal of bread) or grits OR a protein shake or greek yogurt + a fruit serving Granola option to add to greek yogurt: Purely [place] OR an [place] Muffin + peanut butter   Pasta swap out for more lean protein fiber  Barilla protein pasta Banza chickpea pasta  You have room for plenty of veggies

## 2023-04-11 NOTE — Assessment & Plan Note (Signed)
Bilat knee pain has limited her ability to increase walking time. We discussed her barriers to weight loss over the past 1.5 years and the lack of regular exercise is a big factor. She is working on [employer] reduction  Begin PT, referral made

## 2023-04-11 NOTE — Assessment & Plan Note (Signed)
BP is elevated today on losartan 25 mg at bedtime, hydrochlorothiazide 25 mg daily. She denies CP DOE has improved some with asthma improvements  Advised close follow up with PCP for management of HTN

## 2023-04-11 NOTE — Assessment & Plan Note (Signed)
Lab Results  Component Value Date   HGBA1C 6.2 (H) 01/07/2023   She has enjoyed the change from Ozempic 2 mg weekly to Mounjaro 7.5 mg weekly with less GI SE She is working on reducing intake of starches and sweets Exercise has been limited  Increase Mounjaro to 10 mg weekly injection Resume prescribed meal plan Begin PT to aid in physical activity

## 2023-04-11 NOTE — Progress Notes (Signed)
Office: [phone]  /  Fax: [phone]  WEIGHT SUMMARY AND BIOMETRICS  No data recorded No data recorded  Weight Lost Since Last Visit: 1lb   Vitals BP: (!) 159/93 Pulse Rate: 85 SpO2: 97 %   Body Composition  Body Fat %: 49.5 % Fat Mass (lbs): 123.6 lbs Muscle Mass (lbs): 119.6 lbs Total Body Water (lbs): 93 lbs Visceral Fat Rating : 17    HPI  Chief Complaint: OBESITY  [name] is here to discuss her progress with her obesity treatment plan. She is on the the Category 3 Plan and states she is following her eating plan approximately 60 % of the time. She states she is exercising 0 minutes 0 times per week.  Interval History:  Since last office visit she is down 1 lb She is finally feeling better from her asthma  She has completed her 3rd injection of Mounjaro without as much nausea as Ozempic 2 mg weekly She plans to work on meal prep She has cut out ice cream and soda She is drinking more water She hasn't added in more walking due to her asthma but is taking the stairs at work Her husband is a Producer, television/film/video She has not lost weight in the past 1.5 years  Pharmacotherapy: Mounjaro 7.5 mg weekly  PHYSICAL EXAM:  Blood pressure (!) 159/93, pulse 85, height 5\' 3"  (1.6 m), weight 249 lb (112.9 kg), SpO2 97%. Body mass index is 44.11 kg/m.  General: She is overweight, cooperative, alert, well developed, and in no acute distress. PSYCH: Has normal mood, affect and thought process.   Lungs: Normal breathing effort, no conversational dyspnea.   ASSESSMENT AND PLAN  TREATMENT PLAN FOR OBESITY:  Recommended Dietary Goals  [name] is currently in the action stage of change. As such, her goal is to continue weight management plan. She has agreed to practicing portion control and making smarter food choices, such as increasing vegetables and decreasing simple carbohydrates.  Behavioral Intervention  We discussed the following Behavioral Modification Strategies  today: increasing lean protein intake to established goals, increasing vegetables, increasing lower glycemic fruits, increasing fiber rich foods, avoiding skipping meals, increasing water intake , work on meal planning and preparation, keeping healthy foods at home, identifying sources and decreasing liquid calories, work on managing stress, creating time for self-care and relaxation, avoiding temptations and identifying enticing environmental cues, and continue to work on maintaining a reduced calorie state, getting the recommended amount of protein, incorporating whole foods, making healthy choices, staying well hydrated and practicing mindfulness when eating..  Additional resources provided today: NA  Recommended Physical Activity Goals  [name] has been advised to work up to 150 minutes of moderate intensity aerobic activity a week and strengthening exercises 2-3 times per week for cardiovascular health, weight loss maintenance and preservation of muscle mass.   She has agreed to Think about enjoyable ways to increase daily physical activity and overcoming barriers to exercise and Increase physical activity in their day and reduce sedentary time (increase NEAT).  Pharmacotherapy changes for the treatment of obesity: increase Mounjaro to 10 mg once weekly injection  ASSOCIATED CONDITIONS ADDRESSED TODAY  Primary osteoarthritis of both knees Assessment & Plan: Bilat knee pain has limited her ability to increase walking time. We discussed her barriers to weight loss over the past 1.5 years and the lack of regular exercise is a big factor. She is working on [employer] reduction  Begin PT, referral made  Orders: -     Ambulatory referral to  Physical Therapy  Type 2 diabetes mellitus without complication, without long-term current use of insulin ([name]) Assessment & Plan: Lab Results  Component Value Date   HGBA1C 6.2 (H) 01/07/2023   She has enjoyed the change from Ozempic 2 mg weekly to  Mounjaro 7.5 mg weekly with less GI SE She is working on reducing intake of starches and sweets Exercise has been limited  Increase Mounjaro to 10 mg weekly injection Resume prescribed meal plan Begin PT to aid in physical activity  Orders: -     Tirzepatide; Inject 10 mg into the skin once a week.  Dispense: 2 mL; Refill: 1  Class 3 severe obesity due to excess calories with serious comorbidity and body mass index (BMI) of 40.0 to 44.9 in adult [facility])  Essential hypertension Assessment & Plan: BP is elevated today on losartan 25 mg at bedtime, hydrochlorothiazide 25 mg daily. She denies CP DOE has improved some with asthma improvements  Advised close follow up with PCP for management of HTN       She was informed of the importance of frequent follow up visits to maximize her success with intensive lifestyle modifications for her multiple health conditions.   ATTESTASTION STATEMENTS:  Reviewed by clinician on day of visit: allergies, medications, problem list, medical history, surgical history, family history, social history, and previous encounter notes pertinent to obesity diagnosis.   I have personally spent 30 minutes total time today in preparation, patient care, nutritional counseling and documentation for this visit, including the following: review of clinical lab tests; review of medical tests/procedures/services.      [name], DO DABFM, DABOM [facility] [address] [place], [place] [address] [phone]

## 2023-04-12 ENCOUNTER — Other Ambulatory Visit (HOSPITAL_COMMUNITY): Payer: Self-pay

## 2023-04-26 ENCOUNTER — Other Ambulatory Visit (HOSPITAL_COMMUNITY)
Admission: RE | Admit: 2023-04-26 | Discharge: 2023-04-26 | Disposition: A | Discharge status: Home / Self Care | Payer: Self-pay | Source: Ambulatory Visit | Attending: Medical Genetics | Admitting: Medical Genetics

## 2023-04-26 DIAGNOSIS — Z006 Encounter for examination for normal comparison and control in clinical research program: Secondary | ICD-10-CM | POA: Insufficient documentation

## 2023-04-29 ENCOUNTER — Other Ambulatory Visit: Payer: Self-pay

## 2023-04-29 ENCOUNTER — Other Ambulatory Visit (HOSPITAL_COMMUNITY): Payer: Self-pay

## 2023-04-29 ENCOUNTER — Ambulatory Visit (HOSPITAL_BASED_OUTPATIENT_CLINIC_OR_DEPARTMENT_OTHER): Payer: Commercial Managed Care - PPO | Attending: Family Medicine | Admitting: Physical Therapy

## 2023-04-29 DIAGNOSIS — G8929 Other chronic pain: Secondary | ICD-10-CM | POA: Insufficient documentation

## 2023-04-29 DIAGNOSIS — M6281 Muscle weakness (generalized): Secondary | ICD-10-CM | POA: Diagnosis not present

## 2023-04-29 DIAGNOSIS — M170 Bilateral primary osteoarthritis of knee: Secondary | ICD-10-CM | POA: Diagnosis present

## 2023-04-29 DIAGNOSIS — R2689 Other abnormalities of gait and mobility: Secondary | ICD-10-CM | POA: Insufficient documentation

## 2023-04-30 ENCOUNTER — Other Ambulatory Visit: Payer: Self-pay

## 2023-04-30 ENCOUNTER — Encounter (HOSPITAL_BASED_OUTPATIENT_CLINIC_OR_DEPARTMENT_OTHER): Payer: Self-pay | Admitting: Physical Therapy
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
## 2023-05-01 DIAGNOSIS — M2391 Unspecified internal derangement of right knee: Secondary | ICD-10-CM | POA: Diagnosis not present

## 2023-05-01 DIAGNOSIS — M170 Bilateral primary osteoarthritis of knee: Secondary | ICD-10-CM | POA: Diagnosis not present

## 2023-05-02 ENCOUNTER — Telehealth: Payer: Self-pay | Admitting: Physician Assistant

## 2023-05-02 ENCOUNTER — Ambulatory Visit (HOSPITAL_BASED_OUTPATIENT_CLINIC_OR_DEPARTMENT_OTHER): Payer: Commercial Managed Care - PPO | Admitting: Physical Therapy

## 2023-05-02 ENCOUNTER — Other Ambulatory Visit: Payer: Self-pay | Admitting: Internal Medicine

## 2023-05-02 ENCOUNTER — Other Ambulatory Visit (HOSPITAL_COMMUNITY): Payer: Self-pay

## 2023-05-02 ENCOUNTER — Other Ambulatory Visit (HOSPITAL_BASED_OUTPATIENT_CLINIC_OR_DEPARTMENT_OTHER): Payer: Self-pay

## 2023-05-02 ENCOUNTER — Encounter (HOSPITAL_BASED_OUTPATIENT_CLINIC_OR_DEPARTMENT_OTHER): Payer: Self-pay | Admitting: Physical Therapy

## 2023-05-02 ENCOUNTER — Telehealth: Payer: Self-pay | Admitting: Internal Medicine

## 2023-05-02 ENCOUNTER — Other Ambulatory Visit: Payer: Self-pay

## 2023-05-02 DIAGNOSIS — I10 Essential (primary) hypertension: Secondary | ICD-10-CM | POA: Diagnosis not present

## 2023-05-02 DIAGNOSIS — R7303 Prediabetes: Secondary | ICD-10-CM | POA: Diagnosis not present

## 2023-05-02 DIAGNOSIS — K219 Gastro-esophageal reflux disease without esophagitis: Secondary | ICD-10-CM | POA: Diagnosis not present

## 2023-05-02 DIAGNOSIS — E782 Mixed hyperlipidemia: Secondary | ICD-10-CM | POA: Diagnosis not present

## 2023-05-02 DIAGNOSIS — E559 Vitamin D deficiency, unspecified: Secondary | ICD-10-CM | POA: Diagnosis not present

## 2023-05-02 DIAGNOSIS — N1831 Chronic kidney disease, stage 3a: Secondary | ICD-10-CM | POA: Diagnosis not present

## 2023-05-02 DIAGNOSIS — J4550 Severe persistent asthma, uncomplicated: Secondary | ICD-10-CM | POA: Diagnosis not present

## 2023-05-02 DIAGNOSIS — E538 Deficiency of other specified B group vitamins: Secondary | ICD-10-CM | POA: Diagnosis not present

## 2023-05-02 MED ORDER — D-3-5 125 MCG (5000 UT) PO CAPS
5000.0000 [IU] | ORAL_CAPSULE | Freq: Every day | ORAL | 1 refills | Status: DC
  Filled 2023-05-02: qty 1, 1d supply, fill #0
  Filled 2023-05-02: qty 89, 89d supply, fill #0
  Filled 2023-05-02: qty 90, 90d supply, fill #0

## 2023-05-02 MED ORDER — LOSARTAN POTASSIUM-HCTZ 50-12.5 MG PO TABS
1.0000 | ORAL_TABLET | Freq: Every day | ORAL | 1 refills | Status: DC
  Filled 2023-05-02: qty 90, 90d supply, fill #0
  Filled 2023-10-15: qty 90, 90d supply, fill #1

## 2023-05-02 MED ORDER — BUDESONIDE-FORMOTEROL FUMARATE 160-4.5 MCG/ACT IN AERO
2.0000 | INHALATION_SPRAY | Freq: Two times a day (BID) | RESPIRATORY_TRACT | 9 refills | Status: AC
  Filled 2023-05-02: qty 10.2, 30d supply, fill #0

## 2023-05-02 MED ORDER — LEVALBUTEROL TARTRATE 45 MCG/ACT IN AERO
2.0000 | INHALATION_SPRAY | RESPIRATORY_TRACT | 9 refills | Status: AC
  Filled 2023-05-02: qty 15, 30d supply, fill #0

## 2023-05-02 MED ORDER — HYDROCOD POLI-CHLORPHE POLI ER 10-8 MG/5ML PO SUER
5.0000 mL | Freq: Two times a day (BID) | ORAL | 0 refills | Status: DC | PRN
  Filled 2023-05-02: qty 115, 12d supply, fill #0

## 2023-05-02 MED ORDER — DILTIAZEM HCL ER BEADS 360 MG PO CP24
360.0000 mg | ORAL_CAPSULE | Freq: Every day | ORAL | 1 refills | Status: AC
  Filled 2023-05-02: qty 90, 90d supply, fill #0
  Filled 2023-10-15: qty 90, 90d supply, fill #1

## 2023-05-02 MED ORDER — VITAMIN B-12 1000 MCG PO TABS
1000.0000 ug | ORAL_TABLET | Freq: Every day | ORAL | 0 refills | Status: DC
  Filled 2023-05-02: qty 130, 130d supply, fill #0

## 2023-05-02 NOTE — Telephone Encounter (Signed)
 Needs Tusinex ASAP. SOB and wants to stay our of the hospital   [facility] PT/[place]   [phone]

## 2023-05-02 NOTE — Telephone Encounter (Signed)
 Tussionex refilled at [facility]. Stay well-hydrated. Ok to use Mucinex also to help thin mucus.

## 2023-05-02 NOTE — Telephone Encounter (Signed)
Second request. Please advise.

## 2023-05-02 NOTE — Telephone Encounter (Signed)
Pt requesting refill. Please advise °

## 2023-05-02 NOTE — Telephone Encounter (Signed)
 Please put msg in under correct provider so that we can complete thanks

## 2023-05-02 NOTE — Telephone Encounter (Signed)
 Re routing under correct Dr.

## 2023-05-02 NOTE — Telephone Encounter (Signed)
 Pt c/o increased cough x 2 days- coughing up thick, clear sputum No fever Min increased SOB Her Mother just got back from a cruise and was sick and thinks she picked up virus from her  She is taking all of her regular meds and tessalon  but nothing is helping  She is asking for tussionex refill  Please advise thanks

## 2023-05-03 ENCOUNTER — Other Ambulatory Visit: Payer: Self-pay

## 2023-05-03 ENCOUNTER — Other Ambulatory Visit (HOSPITAL_COMMUNITY): Payer: Self-pay

## 2023-05-03 ENCOUNTER — Ambulatory Visit (HOSPITAL_BASED_OUTPATIENT_CLINIC_OR_DEPARTMENT_OTHER): Payer: Commercial Managed Care - PPO | Admitting: Physical Therapy

## 2023-05-03 ENCOUNTER — Encounter (HOSPITAL_BASED_OUTPATIENT_CLINIC_OR_DEPARTMENT_OTHER): Payer: Self-pay | Admitting: Physical Therapy

## 2023-05-03 DIAGNOSIS — R2689 Other abnormalities of gait and mobility: Secondary | ICD-10-CM

## 2023-05-03 DIAGNOSIS — M6281 Muscle weakness (generalized): Secondary | ICD-10-CM

## 2023-05-03 DIAGNOSIS — M170 Bilateral primary osteoarthritis of knee: Secondary | ICD-10-CM | POA: Diagnosis not present

## 2023-05-03 DIAGNOSIS — G8929 Other chronic pain: Secondary | ICD-10-CM

## 2023-05-03 MED ORDER — ALPRAZOLAM 0.5 MG PO TABS
0.5000 mg | ORAL_TABLET | ORAL | 0 refills | Status: AC
  Filled 2023-05-03: qty 8, 2d supply, fill #0

## 2023-05-03 NOTE — Telephone Encounter (Signed)
Tussionex refilled 

## 2023-05-03 NOTE — Telephone Encounter (Signed)
 Spoke with the pt and notified of response from Dr Maple Hudson  Nothing further needed
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
## 2023-05-06 ENCOUNTER — Encounter (HOSPITAL_BASED_OUTPATIENT_CLINIC_OR_DEPARTMENT_OTHER): Payer: Self-pay | Admitting: Physical Therapy

## 2023-05-06 ENCOUNTER — Ambulatory Visit (HOSPITAL_BASED_OUTPATIENT_CLINIC_OR_DEPARTMENT_OTHER): Payer: Commercial Managed Care - PPO | Admitting: Physical Therapy

## 2023-05-06 DIAGNOSIS — M170 Bilateral primary osteoarthritis of knee: Secondary | ICD-10-CM | POA: Diagnosis not present

## 2023-05-06 DIAGNOSIS — G8929 Other chronic pain: Secondary | ICD-10-CM

## 2023-05-06 DIAGNOSIS — M6281 Muscle weakness (generalized): Secondary | ICD-10-CM
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
## 2023-05-09 ENCOUNTER — Other Ambulatory Visit (HOSPITAL_COMMUNITY): Payer: Self-pay

## 2023-05-13 ENCOUNTER — Ambulatory Visit (HOSPITAL_BASED_OUTPATIENT_CLINIC_OR_DEPARTMENT_OTHER): Payer: Commercial Managed Care - PPO | Admitting: Physical Therapy

## 2023-05-13 ENCOUNTER — Encounter (HOSPITAL_BASED_OUTPATIENT_CLINIC_OR_DEPARTMENT_OTHER): Payer: Self-pay | Admitting: Physical Therapy

## 2023-05-13 DIAGNOSIS — G8929 Other chronic pain: Secondary | ICD-10-CM

## 2023-05-13 DIAGNOSIS — M170 Bilateral primary osteoarthritis of knee: Secondary | ICD-10-CM | POA: Diagnosis not present

## 2023-05-13 DIAGNOSIS — M6281 Muscle weakness (generalized): Secondary | ICD-10-CM

## 2023-05-13 LAB — GENECONNECT MOLECULAR SCREEN: Genetic Analysis Overall Interpretation: NEGATIVE
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
## 2023-05-15 ENCOUNTER — Encounter (HOSPITAL_BASED_OUTPATIENT_CLINIC_OR_DEPARTMENT_OTHER): Payer: Self-pay

## 2023-05-15 ENCOUNTER — Ambulatory Visit (HOSPITAL_BASED_OUTPATIENT_CLINIC_OR_DEPARTMENT_OTHER): Payer: Commercial Managed Care - PPO | Admitting: Physical Therapy

## 2023-05-17 DIAGNOSIS — M25561 Pain in right knee: Secondary | ICD-10-CM | POA: Diagnosis not present

## 2023-05-21 ENCOUNTER — Encounter (HOSPITAL_BASED_OUTPATIENT_CLINIC_OR_DEPARTMENT_OTHER): Payer: Self-pay | Admitting: Physical Therapy

## 2023-05-21 ENCOUNTER — Ambulatory Visit (HOSPITAL_BASED_OUTPATIENT_CLINIC_OR_DEPARTMENT_OTHER): Payer: Commercial Managed Care - PPO | Admitting: Physical Therapy

## 2023-05-21 DIAGNOSIS — G8929 Other chronic pain: Secondary | ICD-10-CM

## 2023-05-21 DIAGNOSIS — M6281 Muscle weakness (generalized): Secondary | ICD-10-CM

## 2023-05-21 DIAGNOSIS — R2689 Other abnormalities of gait and mobility: Secondary | ICD-10-CM

## 2023-05-21 DIAGNOSIS — M170 Bilateral primary osteoarthritis of knee: Secondary | ICD-10-CM | POA: Diagnosis not present
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
## 2023-05-23 ENCOUNTER — Encounter (HOSPITAL_BASED_OUTPATIENT_CLINIC_OR_DEPARTMENT_OTHER): Payer: Self-pay | Admitting: Physical Therapy

## 2023-05-23 ENCOUNTER — Ambulatory Visit (HOSPITAL_BASED_OUTPATIENT_CLINIC_OR_DEPARTMENT_OTHER): Payer: Commercial Managed Care - PPO | Admitting: Physical Therapy

## 2023-05-23 DIAGNOSIS — G8929 Other chronic pain: Secondary | ICD-10-CM

## 2023-05-23 DIAGNOSIS — M170 Bilateral primary osteoarthritis of knee: Secondary | ICD-10-CM | POA: Diagnosis not present

## 2023-05-23 DIAGNOSIS — M6281 Muscle weakness (generalized): Secondary | ICD-10-CM
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
## 2023-05-24 ENCOUNTER — Other Ambulatory Visit: Payer: Self-pay | Admitting: Family Medicine

## 2023-05-24 ENCOUNTER — Other Ambulatory Visit (HOSPITAL_COMMUNITY): Payer: Self-pay

## 2023-05-24 DIAGNOSIS — R110 Nausea: Secondary | ICD-10-CM

## 2023-05-27 ENCOUNTER — Other Ambulatory Visit (HOSPITAL_COMMUNITY): Payer: Self-pay

## 2023-05-27 ENCOUNTER — Ambulatory Visit (INDEPENDENT_AMBULATORY_CARE_PROVIDER_SITE_OTHER): Payer: Commercial Managed Care - PPO | Admitting: Family Medicine

## 2023-05-27 ENCOUNTER — Encounter: Payer: Self-pay | Admitting: Family Medicine

## 2023-05-27 ENCOUNTER — Other Ambulatory Visit: Payer: Self-pay

## 2023-05-27 VITALS — BP 190/100 | HR 70 | Temp 98.3°F | Ht 63.0 in | Wt 241.0 lb

## 2023-05-27 DIAGNOSIS — R110 Nausea: Secondary | ICD-10-CM

## 2023-05-27 DIAGNOSIS — M170 Bilateral primary osteoarthritis of knee: Secondary | ICD-10-CM | POA: Diagnosis not present

## 2023-05-27 DIAGNOSIS — E66813 Obesity, class 3: Secondary | ICD-10-CM

## 2023-05-27 DIAGNOSIS — Z7985 Long-term (current) use of injectable non-insulin antidiabetic drugs: Secondary | ICD-10-CM

## 2023-05-27 DIAGNOSIS — I129 Hypertensive chronic kidney disease with stage 1 through stage 4 chronic kidney disease, or unspecified chronic kidney disease: Secondary | ICD-10-CM

## 2023-05-27 DIAGNOSIS — Z6841 Body Mass Index (BMI) 40.0 and over, adult: Secondary | ICD-10-CM

## 2023-05-27 DIAGNOSIS — E1122 Type 2 diabetes mellitus with diabetic chronic kidney disease: Secondary | ICD-10-CM

## 2023-05-27 DIAGNOSIS — N1831 Chronic kidney disease, stage 3a: Secondary | ICD-10-CM

## 2023-05-27 DIAGNOSIS — E538 Deficiency of other specified B group vitamins: Secondary | ICD-10-CM

## 2023-05-27 DIAGNOSIS — E559 Vitamin D deficiency, unspecified: Secondary | ICD-10-CM

## 2023-05-27 DIAGNOSIS — I10 Essential (primary) hypertension: Secondary | ICD-10-CM

## 2023-05-27 DIAGNOSIS — E119 Type 2 diabetes mellitus without complications: Secondary | ICD-10-CM

## 2023-05-27 MED ORDER — ONDANSETRON 4 MG PO TBDP
4.0000 mg | ORAL_TABLET | Freq: Three times a day (TID) | ORAL | 1 refills | Status: DC | PRN
  Filled 2023-05-27: qty 20, 7d supply, fill #0

## 2023-05-27 MED ORDER — VITAMIN D (ERGOCALCIFEROL) 1.25 MG (50000 UNIT) PO CAPS
50000.0000 [IU] | ORAL_CAPSULE | ORAL | 0 refills | Status: DC
  Filled 2023-05-27: qty 5, 35d supply, fill #0

## 2023-05-27 MED ORDER — TIRZEPATIDE 10 MG/0.5ML ~~LOC~~ SOAJ
10.0000 mg | SUBCUTANEOUS | 1 refills | Status: DC
  Filled 2023-05-31: qty 2, 28d supply, fill #0

## 2023-05-27 NOTE — Assessment & Plan Note (Signed)
Managed by Dr Charlann Boxer, she is in water PT 2 days/ wk and waiting MRI results DJD knee pain L>R has been a barrier to her increasing walking time to aid in weight loss. She cannot take NSAIDs due to renal dysfunction  Continue water exercises and active plan for weight loss with future TKR's in sight

## 2023-05-27 NOTE — Assessment & Plan Note (Signed)
Managed by Dr Signe Colt, she denies leg edema or urinary problems but her BP continues to run HIGH She is struggling with compliance in taking all of her medicine daily  We discussed the need to take all of her medicine daily as directed Will cc Dr Signe Colt her labs / notes

## 2023-05-27 NOTE — Assessment & Plan Note (Signed)
Last vitamin D Lab Results  Component Value Date   VD25OH 36.0 01/07/2023   She has been taking RX vitamin D weekly without problems  Repeat lab today

## 2023-05-27 NOTE — Assessment & Plan Note (Signed)
She changed from B12 injections to oral B12 in Sept after her level went from low to high.  She reports fair compliance with B12 500 mcg daily Energy level is fairly low Denies brain fog or paresthesias  Repeat lab today

## 2023-05-27 NOTE — Patient Instructions (Signed)
Remember to eat on a a schedule:  This does not need to be a full meal: Fresh fruit, string cheese Deli meat + cheese Seeq protein powder  + water  + pretzel THINS Handful of pita chips with + hummus Baby carrots + hummus [place] yogurt (< 8 g of sugar) + Purely Elizabeth granola A protein bar like [employer], [employer], Power Crunch bar, Corporate treasurer well with [employer] today

## 2023-05-27 NOTE — Assessment & Plan Note (Signed)
Managed by PCP, Dr Cloyd Stagers- Bonseu Noted her recent anti hypertensive changes adding Diltizem 360 mg at night (she missed last night) and changing hydrochlorothiazide to Losartan hydrochlorothiazide 50/12.5 mg daily.  She admits to poor compliance remembering to take her medicine daily due to stress.  She denies CP, dizziness, vision changes or HA today. Her BP on recheck manually was not improved  Plan: Take Missed BP medicine today when arriving home Husband here as a reminder today F/u with PCP and nephrology for further management

## 2023-05-27 NOTE — Assessment & Plan Note (Signed)
Lab Results  Component Value Date   HGBA1C 6.2 (H) 01/07/2023   She has done well on Mounjaro, better tolerated as far as GI side effects vs Ozempic She has cut back on some of her high sugar foods, soda and ultra processed snacks Portion sizes have reduced but she is meal skipping with inadequate nutrient intake Her muscle mass has stayed stable  Update labs today Continue to work on a reduced kcal healthy low sugar diet with lean protein and fiber intake at meals as reviewed on AVS Continue PT for exercise 2 day/ wk Continue Mounjaro at 10 mg weekly

## 2023-05-27 NOTE — Progress Notes (Signed)
Office: [phone]  /  Fax: [phone]  WEIGHT SUMMARY AND BIOMETRICS  No data recorded No data recorded  Weight Lost Since Last Visit: 8lb   Vitals Temp: 98.3 F (36.8 C) BP: (!) 190/100 Pulse Rate: 70 SpO2: 99 %   Body Composition  Body Fat %: 47.9 % Fat Mass (lbs): 115.6 lbs Muscle Mass (lbs): 119.4 lbs Total Body Water (lbs): 89.8 lbs Visceral Fat Rating : 16     HPI  Chief Complaint: OBESITY  [name] is here to discuss her progress with her obesity treatment plan. She is on the the Category 3 Plan and states she is following her eating plan approximately 65 % of the time. She states she is doing water exercise 45 minutes 2 times per week.   Interval History:  Since last office visit she is down 8 lb in the past 7 weeks She has improved satiety from Mounjaro 10 mg weekly She has not been sleeping well due to working on her graduate program She has started water PT at [facility] for L knee pain (known DJD both knees, sees Dr [name])  MRI done-- has follow up with Dr [name] Due for labs today Her husband is here to support her today She has been skipping meals with Mounjaro and still eats some candy and junk food at times She has cut out ice cream and reduced soda intake  Pharmacotherapy: Mounjaro 10 mg weekly  PHYSICAL EXAM:  Blood pressure (!) 190/100, pulse 70, temperature 98.3 F (36.8 C), height 5\' 3"  (1.6 m), weight 241 lb (109.3 kg), SpO2 99%. Body mass index is 42.69 kg/m.  General: She is overweight, cooperative, alert, well developed, and in no acute distress. PSYCH: Has normal mood, affect and thought process.   Lungs: Normal breathing effort, no conversational dyspnea.   ASSESSMENT AND PLAN  TREATMENT PLAN FOR OBESITY:  Recommended Dietary Goals  [name] is currently in the action stage of change. As such, her goal is to continue weight management plan. She has agreed to practicing portion control and making smarter food choices, such as  increasing vegetables and decreasing simple carbohydrates.  Behavioral Intervention  We discussed the following Behavioral Modification Strategies today: increasing lean protein intake to established goals, increasing vegetables, increasing lower glycemic fruits, increasing fiber rich foods, increasing water intake , work on meal planning and preparation, keeping healthy foods at home, identifying sources and decreasing liquid calories, work on managing stress, creating time for self-care and relaxation, avoiding temptations and identifying enticing environmental cues, planning for success, and continue to work on maintaining a reduced calorie state, getting the recommended amount of protein, incorporating whole foods, making healthy choices, staying well hydrated and practicing mindfulness when eating..  Additional resources provided today: NA  Recommended Physical Activity Goals  [name] has been advised to work up to 150 minutes of moderate intensity aerobic activity a week and strengthening exercises 2-3 times per week for cardiovascular health, weight loss maintenance and preservation of muscle mass.   She has agreed to Think about enjoyable ways to increase daily physical activity and overcoming barriers to exercise and Increase physical activity in their day and reduce sedentary time (increase NEAT). Continue water PT 2 x a week  Pharmacotherapy changes for the treatment of obesity: none  ASSOCIATED CONDITIONS ADDRESSED TODAY  Type 2 diabetes mellitus without complication, without long-term current use of insulin ([name]) Assessment & Plan: Lab Results  Component Value Date   HGBA1C 6.2 (H) 01/07/2023   She has done well  on Mounjaro, better tolerated as far as GI side effects vs Ozempic She has cut back on some of her high sugar foods, soda and ultra processed snacks Portion sizes have reduced but she is meal skipping with inadequate nutrient intake Her muscle mass has stayed  stable  Update labs today Continue to work on a reduced kcal healthy low sugar diet with lean protein and fiber intake at meals as reviewed on AVS Continue PT for exercise 2 day/ wk Continue Mounjaro at 10 mg weekly  Orders: -     Hemoglobin A1c -     Tirzepatide; Inject 10 mg into the skin once a week.  Dispense: 2 mL; Refill: 1  Vitamin D deficiency Assessment & Plan: Last vitamin D Lab Results  Component Value Date   VD25OH 36.0 01/07/2023   She has been taking RX vitamin D weekly without problems  Repeat lab today  Orders: -     VITAMIN D 25 Hydroxy (Vit-D Deficiency, Fractures) -     Vitamin D (Ergocalciferol); Take 1 capsule (50,000 Units total) by mouth every 7 (seven) days.  Dispense: 5 capsule; Refill: 0  Stage 3a chronic kidney disease ([name]) Assessment & Plan: Managed by Dr [name], she denies leg edema or urinary problems but her BP continues to run HIGH She is struggling with compliance in taking all of her medicine daily  We discussed the need to take all of her medicine daily as directed Will cc Dr [name] her labs / notes  Orders: -     Comprehensive metabolic panel  Vitamin B12 deficiency Assessment & Plan: She changed from B12 injections to oral B12 in Sept after her level went from low to high.  She reports fair compliance with B12 500 mcg daily Energy level is fairly low Denies brain fog or paresthesias  Repeat lab today  Orders: -     Vitamin B12  Nausea -     Ondansetron; Dissolve 1 tablet (4 mg total) by mouth every 8 (eight) hours as needed for nausea or vomiting.  Dispense: 20 tablet; Refill: 1  Primary osteoarthritis of both knees Assessment & Plan: Managed by Dr [name], she is in water PT 2 days/ wk and waiting MRI results DJD knee pain L>R has been a barrier to her increasing walking time to aid in weight loss. She cannot take NSAIDs due to renal dysfunction  Continue water exercises and active plan for weight loss with future TKR's in  sight   Class 3 severe obesity due to excess calories with serious comorbidity and body mass index (BMI) of 40.0 to 44.9 in adult [facility])  Essential hypertension Assessment & Plan: Managed by PCP, Dr Cloyd Stagers- Bonseu Noted her recent anti hypertensive changes adding Diltizem 360 mg at night (she missed last night) and changing hydrochlorothiazide to Losartan hydrochlorothiazide 50/12.5 mg daily.  She admits to poor compliance remembering to take her medicine daily due to stress.  She denies CP, dizziness, vision changes or HA today. Her BP on recheck manually was not improved  Plan: Take Missed BP medicine today when arriving home Husband here as a reminder today F/u with PCP and nephrology for further management       She was informed of the importance of frequent follow up visits to maximize her success with intensive lifestyle modifications for her multiple health conditions.   ATTESTASTION STATEMENTS:  Reviewed by clinician on day of visit: allergies, medications, problem list, medical history, surgical history, family history, social history, and previous encounter  notes pertinent to obesity diagnosis.   I have personally spent 30 minutes total time today in preparation, patient care, nutritional counseling and documentation for this visit, including the following: review of clinical lab tests; review of medical tests/procedures/services.      [name], DO DABFM, DABOM [facility] [address] [place], [place] [address] [phone]

## 2023-05-28 ENCOUNTER — Ambulatory Visit (HOSPITAL_BASED_OUTPATIENT_CLINIC_OR_DEPARTMENT_OTHER): Payer: Commercial Managed Care - PPO | Attending: Family Medicine | Admitting: Physical Therapy

## 2023-05-28 DIAGNOSIS — R2689 Other abnormalities of gait and mobility: Secondary | ICD-10-CM | POA: Insufficient documentation

## 2023-05-28 DIAGNOSIS — M25562 Pain in left knee: Secondary | ICD-10-CM | POA: Insufficient documentation

## 2023-05-28 DIAGNOSIS — G8929 Other chronic pain: Secondary | ICD-10-CM | POA: Insufficient documentation

## 2023-05-28 DIAGNOSIS — M6281 Muscle weakness (generalized): Secondary | ICD-10-CM | POA: Insufficient documentation

## 2023-05-28 DIAGNOSIS — M25561 Pain in right knee: Secondary | ICD-10-CM | POA: Insufficient documentation

## 2023-05-29 ENCOUNTER — Other Ambulatory Visit: Payer: Self-pay | Admitting: Family Medicine

## 2023-05-29 DIAGNOSIS — E538 Deficiency of other specified B group vitamins: Secondary | ICD-10-CM

## 2023-05-29 LAB — COMPREHENSIVE METABOLIC PANEL
ALT: 14 [IU]/L (ref 0–32)
AST: 17 [IU]/L (ref 0–40)
Albumin: 4.8 g/dL (ref 3.8–4.9)
Alkaline Phosphatase: 104 [IU]/L (ref 44–121)
BUN/Creatinine Ratio: 19 (ref 9–23)
BUN: 24 mg/dL (ref 6–24)
Bilirubin Total: <0.2 mg/dL (ref 0.0–1.2)
CO2: 18 mmol/L — ABNORMAL LOW (ref 20–29)
Calcium: 10.4 mg/dL — ABNORMAL HIGH (ref 8.7–10.2)
Chloride: 106 mmol/L (ref 96–106)
Creatinine, Ser: 1.26 mg/dL — ABNORMAL HIGH (ref 0.57–1.00)
Globulin, Total: 2.2 g/dL (ref 1.5–4.5)
Glucose: 89 mg/dL (ref 70–99)
Potassium: 4.6 mmol/L (ref 3.5–5.2)
Sodium: 150 mmol/L — ABNORMAL HIGH (ref 134–144)
Total Protein: 7 g/dL (ref 6.0–8.5)
eGFR: 50 mL/min/{1.73_m2} — ABNORMAL LOW (ref >59)

## 2023-05-29 LAB — HEMOGLOBIN A1C
Est. average glucose Bld gHb Est-mCnc: 128 mg/dL
Hgb A1c MFr Bld: 6.1 % — ABNORMAL HIGH (ref 4.8–5.6)

## 2023-05-29 LAB — VITAMIN B12: Vitamin B-12: 230 pg/mL — ABNORMAL LOW (ref 232–1245)

## 2023-05-29 LAB — VITAMIN D 25 HYDROXY (VIT D DEFICIENCY, FRACTURES): Vit D, 25-Hydroxy: 35.6 ng/mL (ref 30.0–100.0)

## 2023-05-29 MED ORDER — BD DISP NEEDLE 30G X 1" MISC
1 refills | Status: DC
  Filled 2023-05-29: qty 50, 50d supply, fill #0

## 2023-05-29 MED ORDER — CYANOCOBALAMIN 1000 MCG/ML IJ SOLN
1000.0000 ug | INTRAMUSCULAR | 6 refills | Status: DC
  Filled 2023-05-29: qty 1, 30d supply, fill #0

## 2023-05-30 ENCOUNTER — Other Ambulatory Visit (HOSPITAL_COMMUNITY): Payer: Self-pay

## 2023-05-30 ENCOUNTER — Ambulatory Visit (HOSPITAL_BASED_OUTPATIENT_CLINIC_OR_DEPARTMENT_OTHER): Payer: Commercial Managed Care - PPO

## 2023-05-31 ENCOUNTER — Other Ambulatory Visit (HOSPITAL_COMMUNITY): Payer: Self-pay

## 2023-06-04 ENCOUNTER — Encounter (HOSPITAL_BASED_OUTPATIENT_CLINIC_OR_DEPARTMENT_OTHER): Payer: Self-pay | Admitting: Physical Therapy

## 2023-06-04 ENCOUNTER — Ambulatory Visit (HOSPITAL_BASED_OUTPATIENT_CLINIC_OR_DEPARTMENT_OTHER): Payer: Commercial Managed Care - PPO | Admitting: Physical Therapy

## 2023-06-04 DIAGNOSIS — R2689 Other abnormalities of gait and mobility: Secondary | ICD-10-CM | POA: Diagnosis present

## 2023-06-04 DIAGNOSIS — G8929 Other chronic pain: Secondary | ICD-10-CM

## 2023-06-04 DIAGNOSIS — M6281 Muscle weakness (generalized): Secondary | ICD-10-CM | POA: Diagnosis present

## 2023-06-04 DIAGNOSIS — M25562 Pain in left knee: Secondary | ICD-10-CM | POA: Diagnosis present

## 2023-06-04 DIAGNOSIS — M25561 Pain in right knee: Secondary | ICD-10-CM | POA: Diagnosis present
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
## 2023-06-06 ENCOUNTER — Encounter (HOSPITAL_BASED_OUTPATIENT_CLINIC_OR_DEPARTMENT_OTHER): Payer: Commercial Managed Care - PPO

## 2023-06-07 ENCOUNTER — Encounter (HOSPITAL_BASED_OUTPATIENT_CLINIC_OR_DEPARTMENT_OTHER): Payer: Self-pay | Admitting: Physical Therapy

## 2023-06-11 ENCOUNTER — Ambulatory Visit (HOSPITAL_BASED_OUTPATIENT_CLINIC_OR_DEPARTMENT_OTHER): Payer: Commercial Managed Care - PPO | Admitting: Physical Therapy

## 2023-06-11 ENCOUNTER — Encounter (HOSPITAL_BASED_OUTPATIENT_CLINIC_OR_DEPARTMENT_OTHER): Payer: Self-pay | Admitting: Physical Therapy

## 2023-06-12 ENCOUNTER — Other Ambulatory Visit (HOSPITAL_COMMUNITY): Payer: Self-pay

## 2023-06-13 ENCOUNTER — Encounter (HOSPITAL_BASED_OUTPATIENT_CLINIC_OR_DEPARTMENT_OTHER): Payer: Self-pay

## 2023-06-13 ENCOUNTER — Ambulatory Visit (HOSPITAL_BASED_OUTPATIENT_CLINIC_OR_DEPARTMENT_OTHER): Payer: Commercial Managed Care - PPO

## 2023-06-13 DIAGNOSIS — G8929 Other chronic pain: Secondary | ICD-10-CM

## 2023-06-13 DIAGNOSIS — R2689 Other abnormalities of gait and mobility: Secondary | ICD-10-CM

## 2023-06-13 DIAGNOSIS — M25561 Pain in right knee: Secondary | ICD-10-CM | POA: Diagnosis not present

## 2023-06-13 DIAGNOSIS — M25562 Pain in left knee: Secondary | ICD-10-CM | POA: Diagnosis not present

## 2023-06-13 DIAGNOSIS — M6281 Muscle weakness (generalized): Secondary | ICD-10-CM
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
## 2023-06-18 ENCOUNTER — Ambulatory Visit (HOSPITAL_BASED_OUTPATIENT_CLINIC_OR_DEPARTMENT_OTHER): Payer: Commercial Managed Care - PPO | Admitting: Physical Therapy

## 2023-06-20 ENCOUNTER — Ambulatory Visit (HOSPITAL_BASED_OUTPATIENT_CLINIC_OR_DEPARTMENT_OTHER): Payer: Commercial Managed Care - PPO
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
## 2023-06-21 ENCOUNTER — Ambulatory Visit: Payer: Commercial Managed Care - PPO | Admitting: Internal Medicine

## 2023-06-21 ENCOUNTER — Ambulatory Visit: Payer: Commercial Managed Care - PPO

## 2023-06-21 ENCOUNTER — Other Ambulatory Visit (HOSPITAL_COMMUNITY): Payer: Self-pay

## 2023-06-21 ENCOUNTER — Encounter: Payer: Self-pay | Admitting: Internal Medicine

## 2023-06-21 VITALS — BP 140/90 | HR 75 | Temp 98.0°F | Ht 63.0 in | Wt 241.0 lb

## 2023-06-21 DIAGNOSIS — J449 Chronic obstructive pulmonary disease, unspecified: Secondary | ICD-10-CM

## 2023-06-21 DIAGNOSIS — J45909 Unspecified asthma, uncomplicated: Secondary | ICD-10-CM | POA: Diagnosis not present

## 2023-06-21 DIAGNOSIS — J4550 Severe persistent asthma, uncomplicated: Secondary | ICD-10-CM

## 2023-06-21 DIAGNOSIS — J309 Allergic rhinitis, unspecified: Secondary | ICD-10-CM | POA: Diagnosis not present

## 2023-06-21 MED ORDER — EPINEPHRINE 0.3 MG/0.3ML IJ SOAJ
INTRAMUSCULAR | 12 refills | Status: AC
  Filled 2023-06-21: qty 2, 2d supply, fill #0

## 2023-06-21 MED ORDER — MONTELUKAST SODIUM 10 MG PO TABS
10.0000 mg | ORAL_TABLET | Freq: Every day | ORAL | 12 refills | Status: AC
  Filled 2023-06-21: qty 30, 30d supply, fill #0

## 2023-06-21 MED ORDER — LEVOCETIRIZINE DIHYDROCHLORIDE 5 MG PO TABS
5.0000 mg | ORAL_TABLET | Freq: Every evening | ORAL | 5 refills | Status: AC
  Filled 2023-06-21: qty 30, 30d supply, fill #0

## 2023-06-21 MED ORDER — AZELASTINE-FLUTICASONE 137-50 MCG/ACT NA SUSP
NASAL | 12 refills | Status: AC
  Filled 2023-06-21: qty 23, 30d supply, fill #0

## 2023-06-21 NOTE — Patient Instructions (Addendum)
 Scripts sent for Dymista nasal spray, Singulair, Xyzal antihistamine and Epipen  Order- CXR    dx moderate intermittent asthma, uncomplicated

## 2023-06-27 ENCOUNTER — Ambulatory Visit (HOSPITAL_BASED_OUTPATIENT_CLINIC_OR_DEPARTMENT_OTHER): Payer: Commercial Managed Care - PPO | Attending: Family Medicine | Admitting: Physical Therapy

## 2023-06-27 DIAGNOSIS — G8929 Other chronic pain: Secondary | ICD-10-CM

## 2023-06-27 DIAGNOSIS — M6281 Muscle weakness (generalized): Secondary | ICD-10-CM | POA: Diagnosis not present

## 2023-06-27 DIAGNOSIS — R2689 Other abnormalities of gait and mobility: Secondary | ICD-10-CM

## 2023-06-27 DIAGNOSIS — M25562 Pain in left knee: Secondary | ICD-10-CM | POA: Insufficient documentation

## 2023-06-27 DIAGNOSIS — M25561 Pain in right knee: Secondary | ICD-10-CM | POA: Insufficient documentation
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
## 2023-06-28 ENCOUNTER — Encounter (HOSPITAL_BASED_OUTPATIENT_CLINIC_OR_DEPARTMENT_OTHER): Payer: Commercial Managed Care - PPO

## 2023-07-02 ENCOUNTER — Other Ambulatory Visit (HOSPITAL_COMMUNITY): Payer: Self-pay

## 2023-07-03 ENCOUNTER — Encounter: Payer: Self-pay | Admitting: Neurology

## 2023-07-09 ENCOUNTER — Ambulatory Visit: Payer: Commercial Managed Care - PPO | Admitting: Family Medicine

## 2023-07-10 ENCOUNTER — Encounter: Payer: Self-pay | Admitting: Family Medicine

## 2023-07-10 ENCOUNTER — Ambulatory Visit (INDEPENDENT_AMBULATORY_CARE_PROVIDER_SITE_OTHER): Admitting: Family Medicine

## 2023-07-10 ENCOUNTER — Other Ambulatory Visit (HOSPITAL_COMMUNITY): Payer: Self-pay

## 2023-07-10 VITALS — BP 173/104 | HR 75 | Temp 98.2°F | Ht 63.0 in | Wt 242.0 lb

## 2023-07-10 DIAGNOSIS — Z7985 Long-term (current) use of injectable non-insulin antidiabetic drugs: Secondary | ICD-10-CM

## 2023-07-10 DIAGNOSIS — R110 Nausea: Secondary | ICD-10-CM | POA: Diagnosis not present

## 2023-07-10 DIAGNOSIS — Z6841 Body Mass Index (BMI) 40.0 and over, adult: Secondary | ICD-10-CM

## 2023-07-10 DIAGNOSIS — I10 Essential (primary) hypertension: Secondary | ICD-10-CM

## 2023-07-10 DIAGNOSIS — E559 Vitamin D deficiency, unspecified: Secondary | ICD-10-CM

## 2023-07-10 DIAGNOSIS — E119 Type 2 diabetes mellitus without complications: Secondary | ICD-10-CM | POA: Diagnosis not present

## 2023-07-10 DIAGNOSIS — M170 Bilateral primary osteoarthritis of knee: Secondary | ICD-10-CM

## 2023-07-10 DIAGNOSIS — E538 Deficiency of other specified B group vitamins: Secondary | ICD-10-CM | POA: Diagnosis not present

## 2023-07-10 DIAGNOSIS — E66813 Obesity, class 3: Secondary | ICD-10-CM | POA: Diagnosis not present

## 2023-07-10 MED ORDER — VITAMIN D (ERGOCALCIFEROL) 1.25 MG (50000 UNIT) PO CAPS
50000.0000 [IU] | ORAL_CAPSULE | ORAL | 0 refills | Status: DC
  Filled 2023-07-10: qty 12, 84d supply, fill #0

## 2023-07-10 MED ORDER — ONDANSETRON 4 MG PO TBDP
4.0000 mg | ORAL_TABLET | Freq: Three times a day (TID) | ORAL | 1 refills | Status: DC | PRN
  Filled 2023-07-10 – 2023-07-22 (×2): qty 30, 10d supply, fill #0

## 2023-07-10 MED ORDER — TIRZEPATIDE 12.5 MG/0.5ML ~~LOC~~ SOAJ
12.5000 mg | SUBCUTANEOUS | 0 refills | Status: DC
  Filled 2023-07-10 – 2023-07-22 (×2): qty 2, 28d supply, fill #0

## 2023-07-10 MED ORDER — CYANOCOBALAMIN 1000 MCG/ML IJ SOLN
1000.0000 ug | INTRAMUSCULAR | 6 refills | Status: DC
  Filled 2023-07-10: qty 1, 30d supply, fill #0

## 2023-07-10 NOTE — Progress Notes (Signed)
 Office: [phone]  /  Fax: [phone]  WEIGHT SUMMARY AND BIOMETRICS  No data recorded No data recorded  Weight Lost Since Last Visit: 0lb   Vitals Temp: 98.2 F (36.8 C) BP: (!) 173/104 Pulse Rate: 75 SpO2: 98 %   Body Composition  Body Fat %: 47.9 % Fat Mass (lbs): 116.2 lbs Muscle Mass (lbs): 120 lbs Total Body Water (lbs): 91.4 lbs Visceral Fat Rating : 16     HPI  Chief Complaint: OBESITY  [name] is here to discuss her progress with her obesity treatment plan. She is on the the Category 3 Plan and states she is following her eating plan approximately 60 % of the time. She states she is exercising 0 minutes 0 times per week.  Interval History:  Since last office visit she is up 1 pound This gives her a net weight gain of 12 pounds in the past 1.5 years of medically supervised weight management She is down about net 40 pounds in the past 3 years  She is still dealing with a high stress job.  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 Hunger and cravings are under fairly good control with use of Mounjaro She has had nausea even on the lowest dose was of Mounjaro, mild in nature and not as bad as while on Ozempic She reports poor compliance taking all of her listed medications including her antihypertensive She is in water PT at [facility] well for left knee DJD pain, managed by Dr. Charlann Boxer and is now having problems with right knee pain  Pharmacotherapy: Mounjaro 10 mg once weekly injection  PHYSICAL EXAM:  Blood pressure (!) 173/104, pulse 75, temperature 98.2 F (36.8 C), height 5\' 3"  (1.6 m), weight 242 lb (109.8 kg), SpO2 98%. Body mass index is 42.87 kg/m.  General: She is overweight, cooperative, alert, well developed, and in no acute distress. PSYCH: Has normal mood, affect and thought process.   Lungs: Normal breathing effort, no conversational dyspnea.   ASSESSMENT AND PLAN  TREATMENT PLAN FOR OBESITY:  Recommended Dietary Goals  [name]  is currently in the action stage of change. As such, her goal is to continue weight management plan. She has agreed to following a lower carbohydrate, vegetable and lean protein rich diet plan.  Behavioral Intervention  We discussed the following Behavioral Modification Strategies today: increasing lean protein intake to established goals, increasing fiber rich foods, avoiding skipping meals, increasing water intake , work on meal planning and preparation, keeping healthy foods at home, decreasing eating out or consumption of processed foods, and making healthy choices when eating convenient foods, practice mindfulness eating and understand the difference between hunger signals and cravings, work on managing stress, creating time for self-care and relaxation, avoiding temptations and identifying enticing environmental cues, and continue to work on maintaining a reduced calorie state, getting the recommended amount of protein, incorporating whole foods, making healthy choices, staying well hydrated and practicing mindfulness when eating..  Additional resources provided today: NA  Recommended Physical Activity Goals  Nanako has been advised to work up to 150 minutes of moderate intensity aerobic activity a week and strengthening exercises 2-3 times per week for cardiovascular health, weight loss maintenance and preservation of muscle mass.   She has agreed to Think about enjoyable ways to increase daily physical activity and overcoming barriers to exercise and Increase physical activity in their day and reduce sedentary time (increase NEAT).  Pharmacotherapy changes for the treatment of obesity: Increase Mounjaro to 12.5 mg  once weekly injection  ASSOCIATED CONDITIONS ADDRESSED TODAY  Type 2 diabetes mellitus without complication, without long-term current use of insulin ([name]) Lab Results  Component Value Date   HGBA1C 6.1 (H) 05/27/2023  Reviewed labs with patient from last visit.  A1c dropped  from 6.2-6.1.  She has done well on Mounjaro at 10 mg once weekly injection has room for improvement with diet and exercise.  Continue to work on healthy dietary changes as outlined on after visit summary.  Recommend tracking daily steps and consider referral to RD next visit.  Increase Mounjaro to 12.5 mg once weekly injection  -     Tirzepatide; Inject 12.5 mg into the skin once a week.  Dispense: 2 mL; Refill: 0  Vitamin D deficiency Last vitamin D Lab Results  Component Value Date   VD25OH 35.6 05/27/2023  Reviewed lab results with patient from last visit.  Her vitamin D is still in the low normal range at 35.6.  She reports poor compliance remembering to take her vitamin D 50,000 IU every week.  I encouraged improved compliance and will continue vitamin D 50,000 IU once weekly.  -     Vitamin D (Ergocalciferol); Take 1 capsule (50,000 Units total) by mouth every 7 (seven) days.  Dispense: 12 capsule; Refill: 0  Vitamin B12 deficiency Lab Results  Component Value Date   VITAMINB12 230 (L) 05/27/2023   Reviewed lab results with patient from last visit.  She has not been taking her B12 injection once monthly and was trialing oral B12 but her B12 level dropped.  She has been complaining of fatigue but denies brain fog or paresthesias.  Will resume vitamin B12 1000 mcg q. 30-day injection.  Patient denies problems with self injecting at home. Repeat lab in 3 months -     Cyanocobalamin; Inject 1 mL (1,000 mcg total) into the muscle every 30 (thirty) days.  Dispense: 1 mL; Refill: 6  Nausea Improved with use of Zofran as needed.  Nausea has worsened due to use of Mounjaro.  She denies constipation, heartburn.  Encouraged her to hydrate well throughout the day, eat small meals, avoid high fat/high sugar foods.  She may also use over-the-counter ginger chews as needed. -     Ondansetron; Dissolve 1 tablet (4 mg total) by mouth every 8 (eight) hours as needed for nausea or vomiting.  Dispense:  30 tablet; Refill: 1  Class 3 severe obesity due to excess calories with serious comorbidity and body mass index (BMI) of 40.0 to 44.9 in adult [facility])  Primary osteoarthritis of both knees Stable.  She is currently in water therapy, managed by Dr. Charlann Boxer.  She is awaiting knee replacement surgery, actively working on BMI reduction. Bilateral knee pain has made it harder for her to exercise but this has impacted her weight loss over the past several years.  Essential hypertension Her blood pressure remains high.  She reports checking blood pressures at home and at work and has been averaging 130s over 80s.  She has occasional headaches.  We reviewed the importance of taking all of her listed antihypertensive medications.  She reports poor compliance remembering to take everything daily.  She agrees to using a pillbox for cell phone alarm reminder to improve compliance.  She has upcoming visit with her nephrologist to help manage hypertension     She was informed of the importance of frequent follow up visits to maximize her success with intensive lifestyle modifications for her multiple health conditions.  ATTESTASTION STATEMENTS:  Reviewed by clinician on day of visit: allergies, medications, problem list, medical history, surgical history, family history, social history, and previous encounter notes pertinent to obesity diagnosis.   I have personally spent 30 minutes total time today in preparation, patient care, nutritional counseling and documentation for this visit, including the following: review of clinical lab tests; review of medical tests/procedures/services.      [name], DO DABFM, DABOM [facility] [address] [place], [place] [address] [phone]

## 2023-07-10 NOTE — Patient Instructions (Signed)
 Work on taking all meds as directed-- use a pill box  Continue water PT Aim for a good workout 20 min daily  Keep the sweets OUT  Protein with meals and snacks  Monitor BP readings  Go up on Mounjaro to 12.5 mg weekly  Try out Ginger Chews as needed

## 2023-07-12 ENCOUNTER — Encounter (HOSPITAL_BASED_OUTPATIENT_CLINIC_OR_DEPARTMENT_OTHER): Payer: Self-pay | Admitting: Physical Therapy

## 2023-07-12 ENCOUNTER — Telehealth: Payer: Self-pay

## 2023-07-12 ENCOUNTER — Ambulatory Visit (HOSPITAL_BASED_OUTPATIENT_CLINIC_OR_DEPARTMENT_OTHER): Payer: Self-pay | Admitting: Physical Therapy

## 2023-07-12 DIAGNOSIS — G8929 Other chronic pain: Secondary | ICD-10-CM | POA: Diagnosis not present

## 2023-07-12 DIAGNOSIS — M6281 Muscle weakness (generalized): Secondary | ICD-10-CM | POA: Diagnosis not present

## 2023-07-12 DIAGNOSIS — R2689 Other abnormalities of gait and mobility: Secondary | ICD-10-CM | POA: Diagnosis not present

## 2023-07-12 DIAGNOSIS — M25562 Pain in left knee: Secondary | ICD-10-CM | POA: Diagnosis not present

## 2023-07-12 DIAGNOSIS — M25561 Pain in right knee: Secondary | ICD-10-CM | POA: Diagnosis not present

## 2023-07-12 NOTE — Telephone Encounter (Signed)
 Tried to call PT twice w/ results no answer and could not leave a message due to the mailbox being full
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
## 2023-07-18 ENCOUNTER — Other Ambulatory Visit (HOSPITAL_COMMUNITY): Payer: Self-pay

## 2023-07-22 ENCOUNTER — Other Ambulatory Visit (HOSPITAL_COMMUNITY): Payer: Self-pay

## 2023-07-25 ENCOUNTER — Other Ambulatory Visit (HOSPITAL_COMMUNITY): Payer: Self-pay

## 2023-07-25 ENCOUNTER — Ambulatory Visit (HOSPITAL_BASED_OUTPATIENT_CLINIC_OR_DEPARTMENT_OTHER): Attending: Family Medicine | Admitting: Physical Therapy

## 2023-07-25 DIAGNOSIS — M25562 Pain in left knee: Secondary | ICD-10-CM | POA: Diagnosis not present

## 2023-07-25 DIAGNOSIS — M25561 Pain in right knee: Secondary | ICD-10-CM | POA: Diagnosis not present

## 2023-07-25 DIAGNOSIS — M6281 Muscle weakness (generalized): Secondary | ICD-10-CM | POA: Insufficient documentation

## 2023-07-25 DIAGNOSIS — G8929 Other chronic pain: Secondary | ICD-10-CM | POA: Insufficient documentation

## 2023-07-25 DIAGNOSIS — R2689 Other abnormalities of gait and mobility: Secondary | ICD-10-CM | POA: Diagnosis not present
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
## 2023-07-29 ENCOUNTER — Ambulatory Visit (HOSPITAL_BASED_OUTPATIENT_CLINIC_OR_DEPARTMENT_OTHER): Admitting: Physical Therapy

## 2023-08-05 ENCOUNTER — Ambulatory Visit (HOSPITAL_BASED_OUTPATIENT_CLINIC_OR_DEPARTMENT_OTHER): Admitting: Physical Therapy

## 2023-08-05 ENCOUNTER — Encounter (HOSPITAL_BASED_OUTPATIENT_CLINIC_OR_DEPARTMENT_OTHER): Payer: Self-pay | Admitting: Physical Therapy

## 2023-08-05 DIAGNOSIS — G8929 Other chronic pain: Secondary | ICD-10-CM

## 2023-08-05 DIAGNOSIS — R2689 Other abnormalities of gait and mobility: Secondary | ICD-10-CM

## 2023-08-05 NOTE — Therapy (Addendum)
 Pt arrives a few hours early for session as she was in building to see MD.  She reports her knee has been unbearable throughout weekend due to pain.  She has to call out of work.  She has an appt to see md for potential injection but is not going to be able to tolerate PT session today.  PHYSICAL THERAPY DISCHARGE SUMMARY  Visits from Start of Care: 11  Current functional level related to goals / functional outcomes: unknown   Remaining deficits: Pain/ increasing immobility due to chronic condition   Education / Equipment: Management of pain/HEP  Patient agrees to discharge. Patient goals were partially met. Patient is being discharged due to exacerbation of right knee pain and dysfunction.   Addend Adriana Hopping Laneta Pintos) Avory Mimbs MPT 09/11/23 11:04 AM [facility] [address] [place], [place], [address] Phone: [phone]   Fax:  [phone]

## 2023-08-09 ENCOUNTER — Other Ambulatory Visit: Payer: Self-pay | Admitting: Obstetrics and Gynecology

## 2023-08-09 DIAGNOSIS — Z1231 Encounter for screening mammogram for malignant neoplasm of breast: Secondary | ICD-10-CM

## 2023-08-12 ENCOUNTER — Encounter (HOSPITAL_BASED_OUTPATIENT_CLINIC_OR_DEPARTMENT_OTHER): Payer: Self-pay | Admitting: Physical Therapy

## 2023-08-13 ENCOUNTER — Encounter: Payer: Self-pay | Admitting: Family Medicine

## 2023-08-13 ENCOUNTER — Other Ambulatory Visit (HOSPITAL_COMMUNITY): Payer: Self-pay

## 2023-08-13 ENCOUNTER — Ambulatory Visit (INDEPENDENT_AMBULATORY_CARE_PROVIDER_SITE_OTHER): Admitting: Family Medicine

## 2023-08-13 VITALS — BP 135/83 | HR 71 | Temp 98.2°F | Ht 63.0 in | Wt 240.0 lb

## 2023-08-13 DIAGNOSIS — Z6841 Body Mass Index (BMI) 40.0 and over, adult: Secondary | ICD-10-CM

## 2023-08-13 DIAGNOSIS — E66813 Obesity, class 3: Secondary | ICD-10-CM

## 2023-08-13 DIAGNOSIS — M170 Bilateral primary osteoarthritis of knee: Secondary | ICD-10-CM

## 2023-08-13 DIAGNOSIS — E538 Deficiency of other specified B group vitamins: Secondary | ICD-10-CM | POA: Diagnosis not present

## 2023-08-13 DIAGNOSIS — E559 Vitamin D deficiency, unspecified: Secondary | ICD-10-CM

## 2023-08-13 DIAGNOSIS — I10 Essential (primary) hypertension: Secondary | ICD-10-CM | POA: Diagnosis not present

## 2023-08-13 DIAGNOSIS — R110 Nausea: Secondary | ICD-10-CM | POA: Diagnosis not present

## 2023-08-13 DIAGNOSIS — Z7985 Long-term (current) use of injectable non-insulin antidiabetic drugs: Secondary | ICD-10-CM | POA: Diagnosis not present

## 2023-08-13 DIAGNOSIS — E119 Type 2 diabetes mellitus without complications: Secondary | ICD-10-CM | POA: Diagnosis not present

## 2023-08-13 MED ORDER — ONDANSETRON 4 MG PO TBDP
4.0000 mg | ORAL_TABLET | Freq: Three times a day (TID) | ORAL | 1 refills | Status: DC | PRN
  Filled 2023-08-13: qty 30, 10d supply, fill #0

## 2023-08-13 MED ORDER — TIRZEPATIDE 12.5 MG/0.5ML ~~LOC~~ SOAJ
12.5000 mg | SUBCUTANEOUS | 1 refills | Status: DC
  Filled 2023-08-13: qty 2, 28d supply, fill #0

## 2023-08-13 NOTE — Patient Instructions (Signed)
 Safe catch tuna Clear protein (Seeq powder + water) Grilled chicken Ground turkey Chicken sausage Oikos Pro greek yogurt Deli turkey + string cheese Chomps meat sticks Sargento snack pack Edamame Peas Chickpeas Hard boiled eggs Hummus + carrots/ celery/ cucumbers Beans Barilla protein pasta  Read labels on food and drink for sugar Avoid products with over 8 g of added sugar per serving

## 2023-08-13 NOTE — Progress Notes (Signed)
 Office: [phone]  /  Fax: [phone]  WEIGHT SUMMARY AND BIOMETRICS  Starting Date: 12/12/21  Starting Weight: 230lb   Weight Lost Since Last Visit: 2lb   Vitals Temp: 98.2 F (36.8 C) BP: 135/83 Pulse Rate: 71 SpO2: 99 %   Body Composition  Body Fat %: 48.3 % Fat Mass (lbs): 116.2 lbs Muscle Mass (lbs): 118.2 lbs Total Body Water (lbs): 93.4 lbs Visceral Fat Rating : 16    HPI  Chief Complaint: OBESITY  Rory is here to discuss her progress with her obesity treatment plan. She is on the the Category 3 Plan and states she is following her eating plan approximately 60 % of the time. She states she is exercising at the pool 60 minutes 2 times per week.  Interval History:  Since last office visit she is down 2 lb This gives her a net weight gain of 10 lb in the past 1.5 years (but down 42 lb in 3+ years) She has had bilateral knee pain with DJD, awaiting MRI results She will be seeing Dr Bernard Brick back to discuss arthroplasty, aiming for November with her schedule Her husband has also been working on reducing sugar intake for his diabetes Exercise has been limited due to arthritis pain She would have the R knee replaced first (L knee is under workers comp) She has been doing water therapy and plans to use her friend's pool She has been taking her BP medicine and her BP is running better She gave up soda for Bryce Captain She is having some heartburn with Mounjaro  with some nausea- using Zofran  prn Denies meal skipping or low blood sugars  Pharmacotherapy: Mounjaro  12.5 mg weekly  PHYSICAL EXAM:  Blood pressure 135/83, pulse 71, temperature 98.2 F (36.8 C), height 5\' 3"  (1.6 m), weight 240 lb (108.9 kg), SpO2 99%. Body mass index is 42.51 kg/m.  General: She is overweight, cooperative, alert, well developed, and in no acute distress. PSYCH: Has normal mood, affect and thought process.   Lungs: Normal breathing effort, no conversational dyspnea.   ASSESSMENT AND  PLAN  TREATMENT PLAN FOR OBESITY:  Recommended Dietary Goals  Carlis is currently in the action stage of change. As such, her goal is to continue weight management plan. She has agreed to practicing portion control and making smarter food choices, such as increasing vegetables and decreasing simple carbohydrates.  Behavioral Intervention  We discussed the following Behavioral Modification Strategies today: increasing lean protein intake to established goals, increasing fiber rich foods, increasing water intake , keeping healthy foods at home, avoiding temptations and identifying enticing environmental cues, continue to practice mindfulness when eating, planning for success, and continue to work on maintaining a reduced calorie state, getting the recommended amount of protein, incorporating whole foods, making healthy choices, staying well hydrated and practicing mindfulness when eating..  Additional resources provided today: NA  Recommended Physical Activity Goals  Kassadee has been advised to work up to 150 minutes of moderate intensity aerobic activity a week and strengthening exercises 2-3 times per week for cardiovascular health, weight loss maintenance and preservation of muscle mass.   She has agreed to Think about enjoyable ways to increase daily physical activity and overcoming barriers to exercise and Increase physical activity in their day and reduce sedentary time (increase NEAT).  Pharmacotherapy changes for the treatment of obesity: none  ASSOCIATED CONDITIONS ADDRESSED TODAY  Type 2 diabetes mellitus without complication, without long-term current use of insulin  ([name]) Tolerating Mounjaro  12.5 mg once weekly injection with  limited adverse side effects and has enjoyed the added satiety.  She has not been consistently tracking her blood sugars and denies issues with hypoglycemia.  Lab Results  Component Value Date   HGBA1C 6.1 (H) 05/27/2023   Good control with last A1c of  6.1.  Continue Mounjaro  12.5 mg once weekly injection along with a reduced calorie low sugar, calorie controlled diet.  Continue active plan for weight loss.  -     Tirzepatide ; Inject 12.5 mg into the skin once a week.  Dispense: 2 mL; Refill: 1  Nausea Improved using Zofran  4 mg tab on injection the day of Mounjaro  and the day after as needed.  Avoid large food volumes, late-night eating, greasy foods, high fat foods and high sugar food items with use of Mounjaro . -     Ondansetron ; Dissolve 1 tablet (4 mg total) by mouth every 8 (eight) hours as needed for nausea or vomiting.  Dispense: 30 tablet; Refill: 1  Class 3 severe obesity due to excess calories with serious comorbidity and body mass index (BMI) of 40.0 to 44.9 in adult [facility]) Reviewed patient's overall progress.  She has seen me for 3+ years of medically supervised weight management.  Over the past year, she has struggled more to see additional weight loss.  Her bilateral knee DJD pain has been a hindrance to her exercise progress.  We have discussed proceeding with arthroplasty in the next few months.  She has done a better job over the past month to reduce her intake of added sugar and her husband is now on form.  Consider bariatric surgery if she fails to make further progress over the next 12 months.  Vitamin D  deficiency Last vitamin D  Lab Results  Component Value Date   VD25OH 35.6 05/27/2023  She has resumed vitamin D  50,000 IU once weekly. Recheck vitamin D  level in 3 to 4 months  Vitamin B12 deficiency She has resumed vitamin B12 once monthly injections.  Her level did drop after forgetting to use her B12.  She has not responded well to oral B12.  She denies extreme fatigue or paresthesias.  Lab Results  Component Value Date   VITAMINB12 230 (L) 05/27/2023   Repeat B12 level in 3 months  Primary osteoarthritis of both knees Worsening.  She reports left greater than right knee pain but plans to have an MRI of the right  knee very soon with follow-up with Dr. Bernard Brick.  She plans to get back into water exercise after completing water PT at [facility] well.  She avoids NSAIDs due to her CKD stage IIIa.  Bilateral knee pain has been a hindrance to further weight loss over the past year. Follow-up with Ortho as scheduled and continue active plan for weight loss. Aim for water exercise 3 days a week  Essential hypertension Blood pressure has improved.  She has been actively working on improving compliance taking all of her antihypertensive medication as directed.  She denies chest pain or leg edema.  She has an upcoming visit to see her nephrologist this month.  Continue all listed antihypertensive medications.     She was informed of the importance of frequent follow up visits to maximize her success with intensive lifestyle modifications for her multiple health conditions.   ATTESTASTION STATEMENTS:  Reviewed by clinician on day of visit: allergies, medications, problem list, medical history, surgical history, family history, social history, and previous encounter notes pertinent to obesity diagnosis.   I have personally spent 36 minutes total  time today in preparation, patient care, nutritional counseling and education,  and documentation for this visit, including the following: review of most recent clinical lab tests, prescribing medications/ refilling medications, reviewing medical assistant documentation, review and interpretation of bioimpedence results.     Micky Albee, D.O. DABFM, DABOM [facility] [address] [place], [place] [address] [phone]

## 2023-08-15 ENCOUNTER — Ambulatory Visit (HOSPITAL_BASED_OUTPATIENT_CLINIC_OR_DEPARTMENT_OTHER): Admitting: Physical Therapy

## 2023-08-15 DIAGNOSIS — N1831 Chronic kidney disease, stage 3a: Secondary | ICD-10-CM | POA: Diagnosis not present

## 2023-08-15 DIAGNOSIS — E669 Obesity, unspecified: Secondary | ICD-10-CM | POA: Diagnosis not present

## 2023-08-15 DIAGNOSIS — E119 Type 2 diabetes mellitus without complications: Secondary | ICD-10-CM | POA: Diagnosis not present

## 2023-08-15 DIAGNOSIS — G932 Benign intracranial hypertension: Secondary | ICD-10-CM | POA: Diagnosis not present

## 2023-08-15 DIAGNOSIS — I129 Hypertensive chronic kidney disease with stage 1 through stage 4 chronic kidney disease, or unspecified chronic kidney disease: Secondary | ICD-10-CM | POA: Diagnosis not present

## 2023-08-18 ENCOUNTER — Encounter (HOSPITAL_BASED_OUTPATIENT_CLINIC_OR_DEPARTMENT_OTHER): Payer: Self-pay | Admitting: Physical Therapy

## 2023-08-22 ENCOUNTER — Ambulatory Visit (HOSPITAL_BASED_OUTPATIENT_CLINIC_OR_DEPARTMENT_OTHER): Admitting: Physical Therapy

## 2023-08-23 ENCOUNTER — Other Ambulatory Visit (HOSPITAL_COMMUNITY): Payer: Self-pay

## 2023-08-27 ENCOUNTER — Other Ambulatory Visit (HOSPITAL_COMMUNITY): Payer: Self-pay

## 2023-09-09 ENCOUNTER — Other Ambulatory Visit: Payer: Self-pay

## 2023-09-09 ENCOUNTER — Emergency Department (HOSPITAL_COMMUNITY)
Admission: EM | Admit: 2023-09-09 | Discharge: 2023-09-09 | Disposition: A | Discharge status: Home / Self Care | Attending: Emergency Medicine | Admitting: Emergency Medicine

## 2023-09-09 ENCOUNTER — Ambulatory Visit: Payer: Self-pay | Admitting: Internal Medicine

## 2023-09-09 ENCOUNTER — Other Ambulatory Visit (HOSPITAL_COMMUNITY): Payer: Self-pay

## 2023-09-09 ENCOUNTER — Ambulatory Visit: Admitting: Student in an Organized Health Care Education/Training Program

## 2023-09-09 ENCOUNTER — Encounter (HOSPITAL_COMMUNITY): Payer: Self-pay

## 2023-09-09 DIAGNOSIS — R059 Cough, unspecified: Secondary | ICD-10-CM | POA: Diagnosis present

## 2023-09-09 DIAGNOSIS — Z8616 Personal history of COVID-19: Secondary | ICD-10-CM | POA: Diagnosis not present

## 2023-09-09 DIAGNOSIS — Z794 Long term (current) use of insulin: Secondary | ICD-10-CM | POA: Diagnosis not present

## 2023-09-09 DIAGNOSIS — Z7951 Long term (current) use of inhaled steroids: Secondary | ICD-10-CM | POA: Insufficient documentation

## 2023-09-09 DIAGNOSIS — J45901 Unspecified asthma with (acute) exacerbation: Secondary | ICD-10-CM | POA: Diagnosis not present

## 2023-09-09 LAB — RESP PANEL BY RT-PCR (RSV, FLU A&B, COVID)  RVPGX2
Influenza A by PCR: NEGATIVE
Influenza B by PCR: NEGATIVE
Resp Syncytial Virus by PCR: NEGATIVE
SARS Coronavirus 2 by RT PCR: NEGATIVE

## 2023-09-09 MED ORDER — DEXAMETHASONE SODIUM PHOSPHATE 10 MG/ML IJ SOLN
10.0000 mg | Freq: Once | INTRAMUSCULAR | Status: AC
  Administered 2023-09-09: 10 mg via INTRAMUSCULAR
  Filled 2023-09-09: qty 1

## 2023-09-09 MED ORDER — HYDROCOD POLI-CHLORPHE POLI ER 10-8 MG/5ML PO SUER
5.0000 mL | Freq: Two times a day (BID) | ORAL | 0 refills | Status: DC | PRN
  Filled 2023-09-09: qty 115, 12d supply, fill #0

## 2023-09-09 MED ORDER — METHYLPREDNISOLONE 4 MG PO TBPK
ORAL_TABLET | ORAL | 0 refills | Status: DC
  Filled 2023-09-09: qty 21, 6d supply, fill #0

## 2023-09-09 MED ORDER — IPRATROPIUM-ALBUTEROL 0.5-2.5 (3) MG/3ML IN SOLN
3.0000 mL | Freq: Once | RESPIRATORY_TRACT | Status: AC
  Administered 2023-09-09: 3 mL via RESPIRATORY_TRACT
  Filled 2023-09-09: qty 3

## 2023-09-09 NOTE — Telephone Encounter (Signed)
 FYI

## 2023-09-09 NOTE — ED Provider Notes (Signed)
 [place] EMERGENCY DEPARTMENT AT [facility] Provider Note   CSN: [id number] Arrival date & time: 09/09/23  1018     History  Chief Complaint  Patient presents with   Asthma   Cough    [name] is a 58 y.o. female.   Asthma  Cough Patient resents shortness of breath and cough.  Today is Monday but reports started to feel worse on Saturday.  Mildly productive cough.  No fevers.  History of asthma and states this feels like her asthma.  States she normally gets a shot of steroids and some steroids at home.  No relief with her nebulizer at home.    Past Medical History:  Diagnosis Date   Allergic rhinitis    Allergy     Asthma    h/o intubation 2001   COVID    November 2020   Dysphagia    Dr. Howard Macho.  egd w/ dilatation 06/08/2007   Esophageal dilatation    GERD (gastroesophageal reflux disease)    Headache    hx migraines   Hypertension in pregnancy    pregnancy induced htn   Meniscal injury    Morbid obesity with body mass index of 45.0-49.9 in adult [facility])    Osteoarthritis    Prediabetes    Prolapsed internal hemorrhoids, grade 2 09/23/2017   Pseudotumor cerebri    has required LP for release of pressure   SOBOE (shortness of breath on exertion)    Steroid-induced hyperglycemia 05/08/2013    Home Medications Prior to Admission medications   Medication Sig Start Date End Date Taking? Authorizing Provider  methylPREDNISolone  (MEDROL  DOSEPAK) 4 MG TBPK tablet Use per package directions 09/09/23  Yes Mozell Arias, MD  albuterol  (VENTOLIN  HFA) 108 (90 Base) MCG/ACT inhaler Inhale 2 puffs into the lungs every 6 hours as needed for wheezing 06/29/21   [employer] D, MD  ALPRAZolam  (XANAX ) 0.5 MG tablet Take 1-2 tablets (0.5-1 mg total) by mouth 1-2 times a day as needed. Take 30 minutes before air travel. 05/02/23     Azelastine -Fluticasone  (DYMISTA ) 137-50 MCG/ACT SUSP Use 1 spray in each nostril twice daily as directed 06/21/23   [employer]  D, MD  benzonatate  (TESSALON ) 100 MG capsule Take 1 capsule (100 mg total) by mouth 3 (three) times daily as needed for cough. 02/09/23   Farris Hong, PA-C  budesonide -formoterol  (SYMBICORT ) 160-4.5 MCG/ACT inhaler Inhale 2 puffs into the lungs 2 (two) times daily. 05/02/23     chlorpheniramine-HYDROcodone  (TUSSIONEX) 10-8 MG/5ML Take 5 mLs by mouth every 12 (twelve) hours as needed for cough. 09/09/23   Mozell Arias, MD  cyanocobalamin  (VITAMIN B12) 1000 MCG/ML injection Inject 1 mL (1,000 mcg total) into the muscle every 30 (thirty) days. 07/10/23   Bowen, Leeta Puls, DO  cyclobenzaprine  (FLEXERIL ) 10 MG tablet Take 1 tablet (10 mg total) by mouth 2 (two) times daily as needed for muscle spasms. 11/08/22   Adel Aden, PA-C  diltiazem  (TIAZAC ) 360 MG 24 hr capsule Take 1 capsule (360 mg total) by mouth daily. 05/02/23     EPINEPHrine  0.3 mg/0.3 mL IJ SOAJ injection Inject into thigh as directed for severe allergic reaction/asthma. 06/21/23   Faustina Hood, MD  esomeprazole  (NEXIUM ) 40 MG capsule Take 1 capsule (40 mg total) by mouth every morning. 12/18/22   [employer] D, MD  furosemide  (LASIX ) 40 MG tablet TAKE 1 TABLET BY MOUTH ONCE DAILY AS NEEDED Patient taking differently: Take 40 mg by mouth daily as  needed for fluid. 07/24/16   Faustina Hood, MD  guaiFENesin  (ROBITUSSIN) 100 MG/5ML liquid Take 5-10 mLs (100-200 mg total) by mouth every 4 (four) hours as needed for cough or to loosen phlegm. 03/10/23   Deatra Face, MD  ibuprofen  (ADVIL ) 800 MG tablet Take 1 tablet (800 mg total) by mouth 3 (three) times daily for 10 days. 11/19/22     ipratropium-albuterol  (DUONEB) 0.5-2.5 (3) MG/3ML SOLN Inhale 1 vial via nebulizer every 4 hours if needed. 02/11/23   Faustina Hood, MD  levalbuterol  (XOPENEX  HFA) 45 MCG/ACT inhaler Inhale 2 puffs into the lungs every 4 (four) hours. 05/28/22     levalbuterol  (XOPENEX  HFA) 45 MCG/ACT inhaler Inhale 2 puffs into the lungs every 4 (four)  hours. 05/02/23   Tretha Fu, MD  levalbuterol  (XOPENEX ) 1.25 MG/0.5ML nebulizer solution Inhale 1.25 mg into the lungs via nebulizer 3 (three) times daily. 05/28/22   Tretha Fu, MD  levalbuterol  (XOPENEX ) 1.25 MG/0.5ML nebulizer solution Inhale 1 vial via nebulizer 3 times a day 12/10/22     levocetirizine (XYZAL ) 5 MG tablet Take 1 tablet (5 mg total) by mouth every evening. 06/21/23   [employer] D, MD  LORazepam  (ATIVAN ) 1 MG tablet Take 1 tablet (1 mg total) by mouth every 8 (eight) hours as needed for anxiety 10/05/22   [employer] D, MD  losartan -hydrochlorothiazide  (HYZAAR ) 50-12.5 MG tablet Take 1 tablet by mouth daily. 05/02/23   Tretha Fu, MD  montelukast  (SINGULAIR ) 10 MG tablet Take 1 tablet (10 mg total) by mouth at bedtime. 06/21/23   Faustina Hood, MD  NEEDLE, DISP, 30 G (B-D DISP NEEDLE 30GX1") 30G X 1" MISC Use as directed monthly for B12 injections 05/29/23   Bowen, Leeta Puls, DO  olopatadine  (PATANOL) 0.1 % ophthalmic solution INSTILL 1 DROP INTO BOTH EYES TWO TIMES DAILY AS NEEDED FOR ALLERGIES 06/29/21   Faustina Hood, MD  ondansetron  (ZOFRAN -ODT) 4 MG disintegrating tablet Dissolve 1 tablet (4 mg total) by mouth every 8 (eight) hours as needed for nausea or vomiting. 08/13/23   Bowen, Leeta Puls, DO  Spacer/Aero-Holding Chambers (AEROCHAMBER MV) inhaler Use as instructed 08/28/13   Vernell Goldsmith, MD  tirzepatide  (MOUNJARO ) 12.5 MG/0.5ML Pen Inject 12.5 mg into the skin once a week. 08/13/23   Bowen, Leeta Puls, DO  traMADol  (ULTRAM ) 50 MG tablet Take 1 tablet (50 mg total) by mouth every 6 (six) hours. 12/13/22     traMADol  (ULTRAM ) 50 MG tablet Take 1 tablet (50 mg total) by mouth every 6 (six) hours. 03/24/23     TRANSDERM-SCOP, 1.5 MG, 1 MG/3DAYS APPLY 1 PATCH ONTO THE SKIN EVERY 3 DAYS. 01/19/16   [employer] D, MD  Vitamin D , Ergocalciferol , (DRISDOL ) 1.25 MG (50000 UNIT) CAPS capsule Take 1 capsule (50,000 Units total) by mouth every 7 (seven) days. 07/10/23    Bowen, Leeta Puls, DO      Allergies    Influenza a (h1n1) monoval vac, Omalizumab, and Topiramate    Review of Systems   Review of Systems  Respiratory:  Positive for cough.     Physical Exam Updated Vital Signs BP (!) 149/101 (BP Location: Left Arm)   Pulse 76   Temp 98.1 F (36.7 C)   Resp 20   Ht 5\' 3"  (1.6 m)   Wt 108.4 kg   SpO2 98%   BMI 42.34 kg/m  Physical Exam Vitals and nursing note reviewed.  Cardiovascular:     Rate and Rhythm: Normal rate.  Pulmonary:     Comments: Mildly harsh breath sounds without focal rales or rhonchi. Musculoskeletal:        General: No tenderness.  Skin:    Capillary Refill: Capillary refill takes less than 2 seconds.  Neurological:     Mental Status: She is alert and oriented to person, place, and time.     ED Results / Procedures / Treatments   Labs (all labs ordered are listed, but only abnormal results are displayed) Labs Reviewed  RESP PANEL BY RT-PCR (RSV, FLU A&B, COVID)  [id number]    EKG None  Radiology No results found.  Procedures Procedures    Medications Ordered in ED Medications  dexamethasone  (DECADRON ) injection 10 mg (10 mg Intramuscular Given 09/09/23 1114)  ipratropium-albuterol  (DUONEB) 0.5-2.5 (3) MG/3ML nebulizer solution 3 mL (3 mLs Nebulization Given 09/09/23 1115)    ED Course/ Medical Decision Making/ A&P                                 Medical Decision Making Risk Prescription drug management.   Patient with breath.  Cough.  History of asthma.  I think most likely asthma exacerbation.  No localizing findings on auscultation.   Feeling better after treatment.  Has had steroids.  Will discharge with cough medicine she has had previously and further steroids.  Stable for discharge home and follow-up with PCP or pulmonology.  Patient's daughter is here also after an MVC.        Final Clinical Impression(s) / ED Diagnoses Final diagnoses:  Exacerbation of asthma, unspecified asthma  severity, unspecified whether persistent    Rx / DC Orders ED Discharge Orders          Ordered    methylPREDNISolone  (MEDROL  DOSEPAK) 4 MG TBPK tablet        09/09/23 1210    chlorpheniramine-HYDROcodone  (TUSSIONEX) 10-8 MG/5ML  Every 12 hours PRN        09/09/23 1210              Mozell Arias, MD 09/09/23 1216

## 2023-09-09 NOTE — Telephone Encounter (Signed)
 Chief Complaint: SOB since Saturday  Symptoms: Coughing, green thick mucous, wheezing Pertinent Negatives: Patient denies fever  Disposition:  [x] Appointment (In office)  Additional Notes: Patient states her asthma is flaring up and she used her nebulizer every two hours last night. Pt states she needs a steroid injection. Pt does not want to go to the hospital and states she is trying to get this taken care of before it gets too bad. Pt scheduled for first available appt today. This RN advised pt has another adult drive her. This RN educated pt on new-worsening symptoms and when to call back/seek emergent care. Pt verbalized understanding and agrees to plan.      Copied from CRM [id number]. Topic: Clinical - Red Word Triage >> Sep 09, 2023  9:00 AM Justina Oman C wrote: Red Word that prompted transfer to Nurse Triage: Patient [phone] states has a bad coughing green phlegm, shortness of breath, wheezing. Patient denies no fever, dizziness nor pain. Patient has asthma wants to be see Dr. Linder Revere or anyone today, please advise. Reason for Disposition  [1] MILD difficulty breathing (e.g., minimal/no SOB at rest, SOB with walking, pulse <100) AND [2] NEW-onset or WORSE than normal  Answer Assessment - Initial Assessment Questions 1. RESPIRATORY STATUS: "Describe your breathing?" (e.g., wheezing, shortness of breath, unable to speak, severe coughing)      SOB, coughing 2. ONSET: "When did this breathing problem begin?"      Saturday 3. PATTERN "Does the difficult breathing come and go, or has it been constant since it started?"      "Consistent since Saturday" 4. SEVERITY: "How bad is your breathing?" (e.g., mild, moderate, severe)    - MILD: No SOB at rest, mild SOB with walking, speaks normally in sentences, can lie down, no retractions, pulse < 100.    - MODERATE: SOB at rest, SOB with minimal exertion and prefers to sit, cannot lie down flat, speaks in phrases, mild retractions, audible  wheezing, pulse 100-120.    - SEVERE: Very SOB at rest, speaks in single words, struggling to breathe, sitting hunched forward, retractions, pulse > 120      Some at rest mainly with movement 5. RECURRENT SYMPTOM: "Have you had difficulty breathing before?" If Yes, ask: "When was the last time?" and "What happened that time?"      Yes, a while since exacerbation  Protocols used: Breathing Difficulty-A-AH

## 2023-09-09 NOTE — ED Triage Notes (Signed)
 Pt arrives via POV. Pt reports she has been dealing asthma exacerbation since Saturday. She reports a productive cough as well. She has been using a nebulizer with no relief. PT is AxOx4. Able to speak in full sentences.

## 2023-09-25 ENCOUNTER — Ambulatory Visit: Payer: Self-pay

## 2023-09-25 ENCOUNTER — Ambulatory Visit (INDEPENDENT_AMBULATORY_CARE_PROVIDER_SITE_OTHER): Admitting: Student in an Organized Health Care Education/Training Program

## 2023-09-25 ENCOUNTER — Other Ambulatory Visit (HOSPITAL_BASED_OUTPATIENT_CLINIC_OR_DEPARTMENT_OTHER): Payer: Self-pay

## 2023-09-25 ENCOUNTER — Ambulatory Visit
Admission: RE | Admit: 2023-09-25 | Discharge: 2023-09-25 | Disposition: A | Discharge status: Home / Self Care | Source: Ambulatory Visit | Attending: Student in an Organized Health Care Education/Training Program | Admitting: Student in an Organized Health Care Education/Training Program

## 2023-09-25 ENCOUNTER — Other Ambulatory Visit (HOSPITAL_COMMUNITY): Payer: Self-pay

## 2023-09-25 ENCOUNTER — Encounter: Payer: Self-pay | Admitting: Student in an Organized Health Care Education/Training Program

## 2023-09-25 ENCOUNTER — Ambulatory Visit (INDEPENDENT_AMBULATORY_CARE_PROVIDER_SITE_OTHER): Admitting: Family Medicine

## 2023-09-25 ENCOUNTER — Encounter: Payer: Self-pay | Admitting: Family Medicine

## 2023-09-25 VITALS — BP 110/78 | HR 82 | Temp 96.9°F | Ht 63.0 in | Wt 230.0 lb

## 2023-09-25 VITALS — BP 130/83 | HR 87 | Temp 98.0°F | Ht 63.0 in | Wt 231.0 lb

## 2023-09-25 DIAGNOSIS — J4550 Severe persistent asthma, uncomplicated: Secondary | ICD-10-CM

## 2023-09-25 DIAGNOSIS — E559 Vitamin D deficiency, unspecified: Secondary | ICD-10-CM | POA: Diagnosis not present

## 2023-09-25 DIAGNOSIS — E66813 Obesity, class 3: Secondary | ICD-10-CM | POA: Diagnosis not present

## 2023-09-25 DIAGNOSIS — M170 Bilateral primary osteoarthritis of knee: Secondary | ICD-10-CM

## 2023-09-25 DIAGNOSIS — E119 Type 2 diabetes mellitus without complications: Secondary | ICD-10-CM | POA: Diagnosis not present

## 2023-09-25 DIAGNOSIS — Z7985 Long-term (current) use of injectable non-insulin antidiabetic drugs: Secondary | ICD-10-CM

## 2023-09-25 DIAGNOSIS — J4541 Moderate persistent asthma with (acute) exacerbation: Secondary | ICD-10-CM | POA: Insufficient documentation

## 2023-09-25 DIAGNOSIS — R059 Cough, unspecified: Secondary | ICD-10-CM | POA: Diagnosis not present

## 2023-09-25 DIAGNOSIS — R110 Nausea: Secondary | ICD-10-CM | POA: Diagnosis not present

## 2023-09-25 DIAGNOSIS — Z6841 Body Mass Index (BMI) 40.0 and over, adult: Secondary | ICD-10-CM

## 2023-09-25 MED ORDER — ONDANSETRON 4 MG PO TBDP
4.0000 mg | ORAL_TABLET | Freq: Three times a day (TID) | ORAL | 1 refills | Status: DC | PRN
  Filled 2023-09-25: qty 30, 10d supply, fill #0

## 2023-09-25 MED ORDER — FLUTICASONE-SALMETEROL 230-21 MCG/ACT IN AERO
2.0000 | INHALATION_SPRAY | Freq: Two times a day (BID) | RESPIRATORY_TRACT | 12 refills | Status: AC
  Filled 2023-09-25: qty 12, 30d supply, fill #0
  Filled 2024-03-06: qty 12, 30d supply, fill #1

## 2023-09-25 MED ORDER — TIRZEPATIDE 12.5 MG/0.5ML ~~LOC~~ SOAJ
12.5000 mg | SUBCUTANEOUS | 1 refills | Status: DC
Start: 2023-09-25 — End: 2023-11-19
  Filled 2023-09-25: qty 2, 28d supply, fill #0
  Filled 2023-10-15 – 2023-10-18 (×3): qty 2, 28d supply, fill #1

## 2023-09-25 NOTE — Telephone Encounter (Signed)
 FYI Only or Action Required?: FYI only for provider  Patient is followed in Pulmonology for Asthma, last seen on 06/21/2023 by Gina Hood, MD. Called Nurse Triage reporting No chief complaint on file.. Symptoms began several weeks ago. Interventions attempted: Rescue inhaler, Maintenance inhaler, and Nebulizer treatments. Symptoms are: gradually worsening.  Triage Disposition: See Physician Within 24 Hours  Patient/caregiver understands and will follow disposition?: Unsure, recommended Primary Care Physician, stated "I don't usually let them handle my asthma."  Copied from CRM [id number]. Topic: Clinical - Red Word Triage >> Sep 25, 2023  1:42 PM [name] wrote: Red Word that prompted transfer to Nurse Triage: Patient [phone] states went t the hospital 09/09/23 and still coughing thick yelllow mucous, shortness of breath, wheezing and has been nebulizer, not helpful as much. Patient denies fever, dizziness, nor pain. Patient wants to see Dr. Linder Revere. Please advise.  Reason for Disposition  [1] Continuous (nonstop) coughing interferes with work or school AND [2] no improvement using cough treatment per Care Advice  Answer Assessment - Initial Assessment Questions 1. ONSET: "When did the cough begin?"      09-09-2023  2. SEVERITY: "How bad is the cough today?"      Constant  3. SPUTUM: "Describe the color of your sputum" (none, dry cough; clear, white, yellow, green)     Yellow-Green, thick  4. HEMOPTYSIS: "Are you coughing up any blood?" If so ask: "How much?" (flecks, streaks, tablespoons, etc.)     No  5. DIFFICULTY BREATHING: "Are you having difficulty breathing?" If Yes, ask: "How bad is it?" (e.g., mild, moderate, severe)    - MILD: No SOB at rest, mild SOB with walking, speaks normally in sentences, can lie down, no retractions, pulse < 100.    - MODERATE: SOB at rest, SOB with minimal exertion and prefers to sit, cannot lie down flat, speaks in phrases, mild retractions,  audible wheezing, pulse 100-120.    - SEVERE: Very SOB at rest, speaks in single words, struggling to breathe, sitting hunched forward, retractions, pulse > 120     Mild  6. FEVER: "Do you have a fever?" If Yes, ask: "What is your temperature, how was it measured, and when did it start?"     No  7. CARDIAC HISTORY: "Do you have any history of heart disease?" (e.g., heart attack, congestive heart failure)      No  8. LUNG HISTORY: "Do you have any history of lung disease?"  (e.g., pulmonary embolus, asthma, emphysema)     Asthma  9. PE RISK FACTORS: "Do you have a history of blood clots?" (or: recent major surgery, recent prolonged travel, bedridden)     No  10. OTHER SYMPTOMS: "Do you have any other symptoms?" (e.g., runny nose, wheezing, chest pain)       Wheezing  11. PREGNANCY: "Is there any chance you are pregnant?" "When was your last menstrual period?"       No and No  12. TRAVEL: "Have you traveled out of the country in the last month?" (e.g., travel history, exposures)       The patient is a Engineer, civil (consulting), unsure  Protocols used: Cough - Acute Productive-A-AH
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
## 2023-09-25 NOTE — Telephone Encounter (Signed)
 Pt was seen by Dr Darnelle Elders today  Routing to Dr Linder Revere as FYI

## 2023-09-25 NOTE — Progress Notes (Signed)
 Office: [phone]  /  Fax: [phone]  WEIGHT SUMMARY AND BIOMETRICS  Starting Date: 12/12/21  Starting Weight: 230lb   Weight Lost Since Last Visit: 9lb   Vitals Temp: 98 F (36.7 C) BP: 130/83 Pulse Rate: 87 SpO2: 98 %   Body Composition  Body Fat %: 45.7 % Fat Mass (lbs): 105.6 lbs Muscle Mass (lbs): 119 lbs Total Body Water (lbs): 94 lbs Visceral Fat Rating : 14    HPI  Chief Complaint: OBESITY  [name] is here to discuss her progress with her obesity treatment plan. She is on the the Category 3 Plan and states she is following her eating plan approximately 90 % of the time. She states she is exercising 40 minutes 3 times per week.  Interval History:  Since last office visit she is down 9 lb This gives her a net weight loss of 50 lb in 3+ years She feels improved appetite control on Mounjaro  12.5 mg weekly She has improved sugar cravings and smaller portion sizes She is working on water intake Her husband has also been working on weight loss She is doing some walking during her day with work and ADLs She completed water PT at [facility] for L knee DJD pain - saw Dr Bernard Brick for bilateral knee DJD She would like to be at 200 lbfor TKR She is trying to get in more protein She has been back on steroids for asthma, still not back to baseline  Pharmacotherapy: Mounjaro  12.5 mg weekly  PHYSICAL EXAM:  Blood pressure 130/83, pulse 87, temperature 98 F (36.7 C), height 5\' 3"  (1.6 m), weight 231 lb (104.8 kg), SpO2 98%. Body mass index is 40.92 kg/m.  General: She is overweight, cooperative, alert, well developed, and in no acute distress. PSYCH: Has normal mood, affect and thought process.   Lungs: Normal breathing effort, no conversational dyspnea.   ASSESSMENT AND PLAN  TREATMENT PLAN FOR OBESITY:  Recommended Dietary Goals  Olene is currently in the action stage of change. As such, her goal is to continue weight management plan. She has agreed to  keeping a food journal and adhering to recommended goals of 1700 calories and 100 g of protein and practicing portion control and making smarter food choices, such as increasing vegetables and decreasing simple carbohydrates.  Behavioral Intervention  We discussed the following Behavioral Modification Strategies today: increasing lean protein intake to established goals, increasing fiber rich foods, increasing water intake , work on meal planning and preparation, keeping healthy foods at home, identifying sources and decreasing liquid calories, practice mindfulness eating and understand the difference between hunger signals and cravings, work on managing stress, creating time for self-care and relaxation, avoiding temptations and identifying enticing environmental cues, and continue to work on maintaining a reduced calorie state, getting the recommended amount of protein, incorporating whole foods, making healthy choices, staying well hydrated and practicing mindfulness when eating..  Additional resources provided today: NA  Recommended Physical Activity Goals  Mariavictoria has been advised to work up to 150 minutes of moderate intensity aerobic activity a week and strengthening exercises 2-3 times per week for cardiovascular health, weight loss maintenance and preservation of muscle mass.   She has agreed to Think about enjoyable ways to increase daily physical activity and overcoming barriers to exercise and Increase physical activity in their day and reduce sedentary time (increase NEAT).  Pharmacotherapy changes for the treatment of obesity: none  ASSOCIATED CONDITIONS ADDRESSED TODAY  Vitamin D  deficiency Last vitamin D  Lab Results  Component Value Date   VD25OH 35.6 05/27/2023  Taking vitamin D  RX weekly Due for repeat lab today  -     VITAMIN D  25 Hydroxy (Vit-D Deficiency, Fractures)  Type 2 diabetes mellitus without complication, without long-term current use of insulin  ([name]) Lab  Results  Component Value Date   HGBA1C 6.1 (H) 05/27/2023  Doing well on Mounjaro  12.5 mg weekly without adverse SE  Has reduced candy and soda intake Exercise limited by asthma exacerbation and arthritis pain F/u with PCP for diabetes care -     Tirzepatide ; Inject 12.5 mg into the skin once a week.  Dispense: 2 mL; Refill: 1 -     Hemoglobin A1c -     Comprehensive metabolic panel with GFR -     Vitamin B12 -     Insulin , random  Nausea -     Ondansetron ; Dissolve 1 tablet (4 mg total) by mouth every 8 (eight) hours as needed for nausea or vomiting.  Dispense: 30 tablet; Refill: 1  Class 3 severe obesity due to excess calories with serious comorbidity and body mass index (BMI) of 40.0 to 44.9 in adult  Primary osteoarthritis of both knees L> R knee pain does hinder exercise She is actively working on BMI reduction and plans to return to Dr Bernard Brick later this year to schedule arthroplasty     She was informed of the importance of frequent follow up visits to maximize her success with intensive lifestyle modifications for her multiple health conditions.   ATTESTASTION STATEMENTS:  Reviewed by clinician on day of visit: allergies, medications, problem list, medical history, surgical history, family history, social history, and previous encounter notes pertinent to obesity diagnosis.   I have personally spent 36 minutes total time today in preparation, patient care, nutritional counseling and education,  and documentation for this visit, including the following: review of most recent clinical lab tests, prescribing medications/ refilling medications, reviewing medical assistant documentation, review and interpretation of bioimpedence results.     [name], D.O. DABFM, DABOM [facility] [address] [place], [place] [address] [phone]

## 2023-09-26 LAB — COMPREHENSIVE METABOLIC PANEL WITH GFR
ALT: 12 IU/L (ref 0–32)
AST: 18 IU/L (ref 0–40)
Albumin: 4.4 g/dL (ref 3.8–4.9)
Alkaline Phosphatase: 101 IU/L (ref 44–121)
BUN/Creatinine Ratio: 13 (ref 9–23)
BUN: 19 mg/dL (ref 6–24)
Bilirubin Total: 0.4 mg/dL (ref 0.0–1.2)
CO2: 23 mmol/L (ref 20–29)
Calcium: 10.1 mg/dL (ref 8.7–10.2)
Chloride: 103 mmol/L (ref 96–106)
Creatinine, Ser: 1.5 mg/dL — ABNORMAL HIGH (ref 0.57–1.00)
Globulin, Total: 2.5 g/dL (ref 1.5–4.5)
Glucose: 86 mg/dL (ref 70–99)
Potassium: 4.1 mmol/L (ref 3.5–5.2)
Sodium: 142 mmol/L (ref 134–144)
Total Protein: 6.9 g/dL (ref 6.0–8.5)
eGFR: 41 mL/min/{1.73_m2} — ABNORMAL LOW (ref >59)

## 2023-09-26 LAB — VITAMIN D 25 HYDROXY (VIT D DEFICIENCY, FRACTURES): Vit D, 25-Hydroxy: 35.8 ng/mL (ref 30.0–100.0)

## 2023-09-26 LAB — INSULIN, RANDOM: INSULIN: 5 u[IU]/mL (ref 2.6–24.9)

## 2023-09-26 LAB — VITAMIN B12: Vitamin B-12: 287 pg/mL (ref 232–1245)

## 2023-09-26 LAB — HEMOGLOBIN A1C
Est. average glucose Bld gHb Est-mCnc: 123 mg/dL
Hgb A1c MFr Bld: 5.9 % — ABNORMAL HIGH (ref 4.8–5.6)

## 2023-09-27 ENCOUNTER — Ambulatory Visit: Payer: Self-pay | Admitting: Family Medicine

## 2023-09-30 ENCOUNTER — Other Ambulatory Visit (HOSPITAL_COMMUNITY): Payer: Self-pay

## 2023-10-06 ENCOUNTER — Ambulatory Visit: Payer: Self-pay | Admitting: Student in an Organized Health Care Education/Training Program

## 2023-10-09 ENCOUNTER — Other Ambulatory Visit: Payer: Self-pay

## 2023-10-09 ENCOUNTER — Other Ambulatory Visit (HOSPITAL_COMMUNITY): Payer: Self-pay

## 2023-10-09 MED ORDER — FLUCONAZOLE 150 MG PO TABS
ORAL_TABLET | ORAL | 2 refills | Status: DC
  Filled 2023-10-09: qty 1, 1d supply, fill #0
  Filled 2024-03-06: qty 1, 1d supply, fill #1

## 2023-10-09 MED ORDER — TERCONAZOLE 0.4 % VA CREA
1.0000 | TOPICAL_CREAM | Freq: Every day | VAGINAL | 2 refills | Status: DC
  Filled 2023-10-09: qty 45, 7d supply, fill #0
  Filled 2024-03-06: qty 45, 7d supply, fill #1

## 2023-10-09 MED ORDER — METRONIDAZOLE 0.75 % VA GEL
1.0000 | Freq: Every day | VAGINAL | 2 refills | Status: DC
  Filled 2023-10-09: qty 70, 7d supply, fill #0
  Filled 2024-03-06: qty 70, 7d supply, fill #1

## 2023-10-15 ENCOUNTER — Other Ambulatory Visit: Payer: Self-pay

## 2023-10-15 ENCOUNTER — Other Ambulatory Visit (HOSPITAL_COMMUNITY): Payer: Self-pay

## 2023-10-15 MED ORDER — TRAMADOL HCL 50 MG PO TABS
50.0000 mg | ORAL_TABLET | Freq: Four times a day (QID) | ORAL | 0 refills | Status: DC
  Filled 2023-10-15: qty 30, 8d supply, fill #0

## 2023-10-16 ENCOUNTER — Other Ambulatory Visit (HOSPITAL_COMMUNITY): Payer: Self-pay

## 2023-10-17 ENCOUNTER — Other Ambulatory Visit (HOSPITAL_COMMUNITY): Payer: Self-pay

## 2023-10-24 ENCOUNTER — Other Ambulatory Visit (HOSPITAL_COMMUNITY): Payer: Self-pay

## 2023-11-06 ENCOUNTER — Ambulatory Visit (INDEPENDENT_AMBULATORY_CARE_PROVIDER_SITE_OTHER): Admitting: Student in an Organized Health Care Education/Training Program

## 2023-11-06 ENCOUNTER — Encounter: Payer: Self-pay | Admitting: Student in an Organized Health Care Education/Training Program

## 2023-11-06 ENCOUNTER — Ambulatory Visit
Admission: RE | Admit: 2023-11-06 | Discharge: 2023-11-06 | Disposition: A | Discharge status: Home / Self Care | Source: Ambulatory Visit | Attending: Student in an Organized Health Care Education/Training Program | Admitting: Student in an Organized Health Care Education/Training Program

## 2023-11-06 ENCOUNTER — Ambulatory Visit: Payer: Self-pay | Admitting: Student in an Organized Health Care Education/Training Program

## 2023-11-06 ENCOUNTER — Other Ambulatory Visit (HOSPITAL_COMMUNITY): Payer: Self-pay

## 2023-11-06 ENCOUNTER — Other Ambulatory Visit (HOSPITAL_BASED_OUTPATIENT_CLINIC_OR_DEPARTMENT_OTHER): Payer: Self-pay

## 2023-11-06 VITALS — BP 130/80 | HR 79 | Temp 98.8°F | Ht 63.0 in | Wt 230.0 lb

## 2023-11-06 DIAGNOSIS — J4541 Moderate persistent asthma with (acute) exacerbation: Secondary | ICD-10-CM | POA: Diagnosis not present

## 2023-11-06 DIAGNOSIS — J45901 Unspecified asthma with (acute) exacerbation: Secondary | ICD-10-CM | POA: Diagnosis not present

## 2023-11-06 MED ORDER — PREDNISONE 20 MG PO TABS
40.0000 mg | ORAL_TABLET | Freq: Every day | ORAL | 0 refills | Status: AC
  Filled 2023-11-06: qty 10, 5d supply, fill #0

## 2023-11-06 MED ORDER — AMOXICILLIN-POT CLAVULANATE 875-125 MG PO TABS
1.0000 | ORAL_TABLET | Freq: Two times a day (BID) | ORAL | 0 refills | Status: AC
  Filled 2023-11-06: qty 14, 7d supply, fill #0

## 2023-11-06 NOTE — Telephone Encounter (Signed)
 NA. No voicemail. X 2

## 2023-11-06 NOTE — Telephone Encounter (Signed)
 Appt scheduled NFN

## 2023-11-06 NOTE — Telephone Encounter (Signed)
 FYI Only or Action Required?: Action required by provider: refusing ED, requesting appt.  Patient is followed in Pulmonology for asthma and prior OSA, last seen on 09/25/2023 by Isadora Hose, MD.  Called Nurse Triage reporting Cough, chest congestion, Wheezing, Shortness of Breath, Fever, Fatigue, yellow sputum, low oxygen level, Dizziness, Neck Pain, and Headache.  Symptoms began several weeks ago.  Interventions attempted: OTC medications: tussinex, tessalon  pearles, Prescription medications: dose pack would not specify, and Nebulizer treatments.  Symptoms are: rapidly worsening.  Triage Disposition: Go to ED Now (Notify PCP)  Patient/caregiver understands and will follow disposition?: No, refuses disposition      Copied from CRM [id number]. Topic: Clinical - Red Word Triage >> Nov 06, 2023 11:07 AM Russell PARAS wrote: Red Word that prompted transfer to Nurse Triage:   Severe cough for 2 weeks Congested cough Thick mucus intermittently, was clear at first and is now changing colors Wheezing SOB Using nebulizer more frequently Duoneb treatments only provide relief for about an hour.  Using Tussinex and tessalon  pearls Fever with onset of symptoms, does not go above 100 Fatigued. Reason for Disposition  [1] MODERATE difficulty breathing (e.g., speaks in phrases, SOB even at rest, pulse 100-120) AND [2] NEW-onset or WORSE than normal  Answer Assessment - Initial Assessment Questions E2C2 Pulmonary Triage - Initial Assessment Questions Chief Complaint (e.g., cough, sob, wheezing, fever, chills, sweat or additional symptoms) *Go to specific symptom protocol after initial questions. Coughing fit, tight on phone Severe cough, chest congestion More SOB than usual, with exertion and at rest Wheezing intermittently Feel like did when had RSV Neck sore Head hurts sometimes Potential exposure to covid and pneumonia Did duoneb last night, only an hour then back out it Dose pack at  the house, took it, took 2 big doses 60 felt good but think taper was too quick Up and down all night Don't want to be admitted to hospital but that's what I feel like When first started had fever no longer have fever, highest was 102 F, no fever for past few days, but I'm exhausted Chills in beginning, now come and go When cough, feels like something tickling my throat, cough hard but can't get it up Very thick yellowish sputum  How long have symptoms been present? 2 weeks  MEDICINES:   Have you used any OTC meds to help with symptoms? Yes If yes, ask What medications? Tussinex, tessalon  pearles Tussinex usually works for 12 hours but now not working for me  Have you used your inhalers/maintenance medication? Yes If yes, What medications? Rescue inhaler Off of symbicort  and started new one, can't remember name of it Rescue inhaler, don't help me, use the chamber but just go straight to the nebulizer, how you know I don't feel good Duoneb nebulizer  If inhaler, ask How many puffs and how often? Note: Review instructions on medication in the chart. Duoneb nebulizer at home every 2.5-3 hours, work today for meeting, took before work, ready to go home now, hard to go 4 hours between, know I need IV steroids but don't want to go to hospital Cough all through night  OXYGEN: Do you wear supplemental oxygen? No  Do you monitor your oxygen levels? Yes If yes, What is your reading (oxygen level) today? 91%, most dropped was 89% doing lot of coughing  What is your usual oxygen saturation reading?  (Note: Pulmonary O2 sats should be 90% or greater) Up in high 90s or 100%  6. CARDIAC HISTORY: Do you  have any history of heart disease? (e.g., heart attack, angina, bypass surgery, angioplasty)      HTN, BP been okay, taking meds, head been hurting but probably from coughing so much, sometimes when cough a lot get a little dizzy  7. LUNG HISTORY: Do you have any  history of lung disease?  (e.g., pulmonary embolus, asthma, emphysema)     Asthma and OSA, don't have CPAP anymore because did retest said negative for OSA but lost lot of weight   Pt stating would do direct admit, but not waiting at ED, going to keep me out there exposed. Advised that she would be high priority pt with oxygen level so much lower than usual plus other symptoms. 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 Pt refusing, requesting appt. Sending message to pulm for call back to pt with further recommendations/appt options. Advised call 911 if any worsening or new symptoms.  Protocols used: Breathing Difficulty-A-AH
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
## 2023-11-07 ENCOUNTER — Encounter

## 2023-11-08 ENCOUNTER — Encounter (HOSPITAL_COMMUNITY): Payer: Self-pay

## 2023-11-08 ENCOUNTER — Emergency Department (HOSPITAL_COMMUNITY)
Admission: EM | Admit: 2023-11-08 | Discharge: 2023-11-08 | Disposition: A | Discharge status: Home / Self Care | Attending: Emergency Medicine | Admitting: Emergency Medicine

## 2023-11-08 ENCOUNTER — Emergency Department (HOSPITAL_COMMUNITY)

## 2023-11-08 ENCOUNTER — Telehealth: Payer: Self-pay | Admitting: Student in an Organized Health Care Education/Training Program

## 2023-11-08 ENCOUNTER — Other Ambulatory Visit: Payer: Self-pay

## 2023-11-08 DIAGNOSIS — R0789 Other chest pain: Secondary | ICD-10-CM | POA: Insufficient documentation

## 2023-11-08 DIAGNOSIS — R0602 Shortness of breath: Secondary | ICD-10-CM | POA: Diagnosis not present

## 2023-11-08 DIAGNOSIS — J45901 Unspecified asthma with (acute) exacerbation: Secondary | ICD-10-CM | POA: Diagnosis not present

## 2023-11-08 DIAGNOSIS — J45909 Unspecified asthma, uncomplicated: Secondary | ICD-10-CM | POA: Insufficient documentation

## 2023-11-08 DIAGNOSIS — Z8616 Personal history of COVID-19: Secondary | ICD-10-CM | POA: Insufficient documentation

## 2023-11-08 LAB — BRAIN NATRIURETIC PEPTIDE: B Natriuretic Peptide: 24.6 pg/mL (ref 0.0–100.0)

## 2023-11-08 LAB — RESP PANEL BY RT-PCR (RSV, FLU A&B, COVID)  RVPGX2
Influenza A by PCR: NEGATIVE
Influenza B by PCR: NEGATIVE
Resp Syncytial Virus by PCR: NEGATIVE
SARS Coronavirus 2 by RT PCR: NEGATIVE

## 2023-11-08 LAB — BASIC METABOLIC PANEL WITH GFR
Anion gap: 12 (ref 5–15)
BUN: 25 mg/dL — ABNORMAL HIGH (ref 6–20)
CO2: 24 mmol/L (ref 22–32)
Calcium: 9.1 mg/dL (ref 8.9–10.3)
Chloride: 103 mmol/L (ref 98–111)
Creatinine, Ser: 1.4 mg/dL — ABNORMAL HIGH (ref 0.44–1.00)
GFR, Estimated: 44 mL/min — ABNORMAL LOW (ref >60)
Glucose, Bld: 91 mg/dL (ref 70–99)
Potassium: 3.7 mmol/L (ref 3.5–5.1)
Sodium: 139 mmol/L (ref 135–145)

## 2023-11-08 LAB — TROPONIN I (HIGH SENSITIVITY)
Troponin I (High Sensitivity): 7 ng/L (ref <18)
Troponin I (High Sensitivity): 7 ng/L (ref <18)

## 2023-11-08 MED ORDER — METHYLPREDNISOLONE SODIUM SUCC 125 MG IJ SOLR
125.0000 mg | Freq: Once | INTRAMUSCULAR | Status: AC
  Administered 2023-11-08: 125 mg via INTRAVENOUS
  Filled 2023-11-08: qty 2

## 2023-11-08 MED ORDER — IOHEXOL 350 MG/ML SOLN
75.0000 mL | Freq: Once | INTRAVENOUS | Status: AC | PRN
Start: 2023-11-08 — End: 2023-11-08
  Administered 2023-11-08: 75 mL via INTRAVENOUS

## 2023-11-08 MED ORDER — IPRATROPIUM-ALBUTEROL 0.5-2.5 (3) MG/3ML IN SOLN
3.0000 mL | Freq: Once | RESPIRATORY_TRACT | Status: AC
  Administered 2023-11-08: 3 mL via RESPIRATORY_TRACT
  Filled 2023-11-08: qty 3

## 2023-11-08 NOTE — Discharge Instructions (Addendum)
 You were evaluated in the emergency room for shortness of breath.  Your lab work and imaging did not show any significant abnormality.  Please complete your course of steroids and follow-up with your pulmonologist.

## 2023-11-08 NOTE — Telephone Encounter (Signed)
 I called the patient and discussed the result from her chest xray. I'm concerned for a deep sulcus sign on the right that could suggest a pneumothorax. I have asked her to go to the ED to get a chest CT done.  Belva November, MD [facility] 11/08/2023 4:30 PM

## 2023-11-08 NOTE — ED Triage Notes (Addendum)
 Patient has been increased short of breath over the last 2 weeks. Completed steroids Sunday. Still felt short of breath. Called her doctor. Had a chest xray done Wednesday. Her doctor said it looks like pneumothorax.

## 2023-11-08 NOTE — ED Provider Notes (Signed)
 [place] EMERGENCY DEPARTMENT AT [facility] Provider Note   CSN: [id number] Arrival date & time: 11/08/23  1718     Patient presents with: Shortness of Breath   [name] is a 58 y.o. female with history of asthma who presents with complaints of shortness of breath.  Patient reports that she has felt like she has had a asthma exacerbation for the past 2 weeks.  Denies any fevers or congestion.  Denies any chest pain.  But does endorse chest tightness.  She does have a history of intubation in the setting of asthma.  She was evaluated by her pulmonologist.  Chest x-ray was reviewed which was concerning for possible pneumothorax.  She was.  Referred here for CT imaging.    Shortness of Breath  Past Medical History:  Diagnosis Date   Allergic rhinitis    Allergy     Asthma    h/o intubation 2001   COVID    November 2020   Dysphagia    Dr. Teressa.  egd w/ dilatation 06/08/2007   Esophageal dilatation    GERD (gastroesophageal reflux disease)    Headache    hx migraines   Hypertension in pregnancy    pregnancy induced htn   Meniscal injury    Morbid obesity with body mass index of 45.0-49.9 in adult [facility])    Osteoarthritis    Prediabetes    Prolapsed internal hemorrhoids, grade 2 09/23/2017   Pseudotumor cerebri    has required LP for release of pressure   SOBOE (shortness of breath on exertion)    Steroid-induced hyperglycemia 05/08/2013       Prior to Admission medications   Medication Sig Start Date End Date Taking? Authorizing Provider  albuterol  (VENTOLIN  HFA) 108 (90 Base) MCG/ACT inhaler Inhale 2 puffs into the lungs every 6 hours as needed for wheezing Patient not taking: Reported on 11/06/2023 06/29/21   Neysa Reggy BIRCH, MD  ALPRAZolam  (XANAX ) 0.5 MG tablet Take 1-2 tablets (0.5-1 mg total) by mouth 1-2 times a day as needed. Take 30 minutes before air travel. 05/02/23     amoxicillin -clavulanate (AUGMENTIN ) 875-125 MG tablet Take 1 tablet by mouth  2 (two) times daily for 7 days. 11/06/23 11/13/23  Isadora Hose, MD  Azelastine -Fluticasone  (DYMISTA ) 137-50 MCG/ACT SUSP Use 1 spray in each nostril twice daily as directed 06/21/23   Neysa Reggy D, MD  benzonatate  (TESSALON ) 100 MG capsule Take 1 capsule (100 mg total) by mouth 3 (three) times daily as needed for cough. 02/09/23   Gladis Elsie BROCKS, PA-C  budesonide -formoterol  (SYMBICORT ) 160-4.5 MCG/ACT inhaler Inhale 2 puffs into the lungs 2 (two) times daily. 05/02/23     chlorpheniramine-HYDROcodone  (TUSSIONEX) 10-8 MG/5ML Take 5 mLs by mouth every 12 (twelve) hours as needed for cough. 09/09/23   Patsey Lot, MD  cyanocobalamin  (VITAMIN B12) 1000 MCG/ML injection Inject 1 mL (1,000 mcg total) into the muscle every 30 (thirty) days. 07/10/23   Bowen, Darice BRAVO, DO  cyclobenzaprine  (FLEXERIL ) 10 MG tablet Take 1 tablet (10 mg total) by mouth 2 (two) times daily as needed for muscle spasms. 11/08/22   Ruthell Lonni FALCON, PA-C  diltiazem  (TIAZAC ) 360 MG 24 hr capsule Take 1 capsule (360 mg total) by mouth daily. 05/02/23     EPINEPHrine  0.3 mg/0.3 mL IJ SOAJ injection Inject into thigh as directed for severe allergic reaction/asthma. 06/21/23   Neysa Reggy BIRCH, MD  esomeprazole  (NEXIUM ) 40 MG capsule Take 1 capsule (40 mg total) by mouth every morning.  12/18/22   Neysa Rama D, MD  fluconazole  (DIFLUCAN ) 150 MG tablet Take 1 tablet by mouth NOW, and repeat in 3 days as directed. 10/09/23     fluticasone -salmeterol (ADVAIR  HFA) 230-21 MCG/ACT inhaler Inhale 2 puffs into the lungs 2 (two) times daily. 09/25/23   Isadora Hose, MD  furosemide  (LASIX ) 40 MG tablet TAKE 1 TABLET BY MOUTH ONCE DAILY AS NEEDED Patient taking differently: Take 40 mg by mouth daily as needed for fluid. 07/24/16   Neysa Rama BIRCH, MD  guaiFENesin  (ROBITUSSIN) 100 MG/5ML liquid Take 5-10 mLs (100-200 mg total) by mouth every 4 (four) hours as needed for cough or to loosen phlegm. 03/10/23   Charlyn Sora, MD  ibuprofen   (ADVIL ) 800 MG tablet Take 1 tablet (800 mg total) by mouth 3 (three) times daily for 10 days. 11/19/22     ipratropium-albuterol  (DUONEB) 0.5-2.5 (3) MG/3ML SOLN Inhale 1 vial via nebulizer every 4 hours if needed. 02/11/23   Neysa Rama BIRCH, MD  levalbuterol  (XOPENEX  HFA) 45 MCG/ACT inhaler Inhale 2 puffs into the lungs every 4 (four) hours. Patient not taking: Reported on 11/06/2023 05/28/22     levalbuterol  (XOPENEX  HFA) 45 MCG/ACT inhaler Inhale 2 puffs into the lungs every 4 (four) hours. Patient not taking: Reported on 11/06/2023 05/02/23   Catalina Bare, MD  levalbuterol  (XOPENEX ) 1.25 MG/0.5ML nebulizer solution Inhale 1.25 mg into the lungs via nebulizer 3 (three) times daily. Patient not taking: Reported on 11/06/2023 05/28/22   Catalina Bare, MD  levalbuterol  (XOPENEX ) 1.25 MG/0.5ML nebulizer solution Inhale 1 vial via nebulizer 3 times a day Patient not taking: Reported on 11/06/2023 12/10/22     levocetirizine (XYZAL ) 5 MG tablet Take 1 tablet (5 mg total) by mouth every evening. 06/21/23   Neysa Rama D, MD  LORazepam  (ATIVAN ) 1 MG tablet Take 1 tablet (1 mg total) by mouth every 8 (eight) hours as needed for anxiety 10/05/22   Neysa Rama D, MD  losartan -hydrochlorothiazide  (HYZAAR ) 50-12.5 MG tablet Take 1 tablet by mouth daily. 05/02/23   Catalina Bare, MD  metroNIDAZOLE  (METROGEL ) 0.75 % vaginal gel Place 1 Applicatorful vaginally at bedtime FOR 5 DAYS AS DIRECTED AS NEEDED. 10/09/23     montelukast  (SINGULAIR ) 10 MG tablet Take 1 tablet (10 mg total) by mouth at bedtime. 06/21/23   Neysa Rama BIRCH, MD  NEEDLE, DISP, 30 G (B-D DISP NEEDLE 30GX1) 30G X 1 MISC Use as directed monthly for B12 injections 05/29/23   Bowen, Darice BRAVO, DO  olopatadine  (PATANOL) 0.1 % ophthalmic solution INSTILL 1 DROP INTO BOTH EYES TWO TIMES DAILY AS NEEDED FOR ALLERGIES 06/29/21   Neysa Rama BIRCH, MD  ondansetron  (ZOFRAN -ODT) 4 MG disintegrating tablet Dissolve 1 tablet (4 mg total) by mouth every 8  (eight) hours as needed for nausea or vomiting. 09/25/23   Bowen, Darice BRAVO, DO  predniSONE  (DELTASONE ) 20 MG tablet Take 2 tablets (40 mg total) by mouth daily with breakfast for 5 days. 11/06/23 11/11/23  Isadora Hose, MD  Spacer/Aero-Holding Chambers (AEROCHAMBER MV) inhaler Use as instructed 08/28/13   Brien Belvie BRAVO, MD  terconazole  (TERAZOL 7 ) 0.4 % vaginal cream Place 1 applicator vaginally at bedtime FOR 7 DAYS AS DIRECTED. 10/09/23     tirzepatide  (MOUNJARO ) 12.5 MG/0.5ML Pen Inject 12.5 mg into the skin once a week. 09/25/23   Bowen, Darice BRAVO, DO  traMADol  (ULTRAM ) 50 MG tablet Take 1 tablet (50 mg total) by mouth every 6 (six) hours. 03/24/23     traMADol  (ULTRAM ) 50  MG tablet Take 1 tablet (50 mg total) by mouth every 6 (six) hours. 10/15/23     TRANSDERM-SCOP, 1.5 MG, 1 MG/3DAYS APPLY 1 PATCH ONTO THE SKIN EVERY 3 DAYS. 01/19/16   Neysa Rama D, MD  Vitamin D , Ergocalciferol , (DRISDOL ) 1.25 MG (50000 UNIT) CAPS capsule Take 1 capsule (50,000 Units total) by mouth every 7 (seven) days. 07/10/23   Bowen, Darice BRAVO, DO    Allergies: Influenza a (h1n1) monoval vac, Omalizumab, and Topiramate    Review of Systems  Respiratory:  Positive for shortness of breath.     Updated Vital Signs BP (!) 174/89 (BP Location: Left Arm)   Pulse 85   Temp 98.6 F (37 C) (Oral)   Resp 15   Ht 5' 3 (1.6 m)   Wt 104.3 kg   SpO2 99%   BMI 40.74 kg/m   Physical Exam Vitals and nursing note reviewed.  Constitutional:      General: She is not in acute distress.    Appearance: She is well-developed.  HENT:     Head: Normocephalic and atraumatic.  Eyes:     Conjunctiva/sclera: Conjunctivae normal.  Cardiovascular:     Rate and Rhythm: Normal rate and regular rhythm.     Heart sounds: No murmur heard. Pulmonary:     Effort: Pulmonary effort is normal. No respiratory distress.     Comments: Diminished breath sounds with diffuse wheezing Abdominal:     Palpations: Abdomen is soft.     Tenderness:  There is no abdominal tenderness.  Musculoskeletal:        General: No swelling.     Cervical back: Neck supple.  Skin:    General: Skin is warm and dry.     Capillary Refill: Capillary refill takes less than 2 seconds.  Neurological:     Mental Status: She is alert.  Psychiatric:        Mood and Affect: Mood normal.     (all labs ordered are listed, but only abnormal results are displayed) Labs Reviewed  BASIC METABOLIC PANEL WITH GFR - Abnormal; Notable for the following components:      Result Value   BUN 25 (*)    Creatinine, Ser 1.40 (*)    GFR, Estimated 44 (*)    All other components within normal limits  RESP PANEL BY RT-PCR (RSV, FLU A&B, COVID)  RVPGX2  BRAIN NATRIURETIC PEPTIDE  TROPONIN I (HIGH SENSITIVITY)  TROPONIN I (HIGH SENSITIVITY)    EKG: EKG Interpretation Date/Time:  Friday November 08 2023 17:28:11 EDT Ventricular Rate:  83 PR Interval:  162 QRS Duration:  84 QT Interval:  381 QTC Calculation: 448 R Axis:   14  Text Interpretation: Sinus rhythm Left ventricular hypertrophy No significant change since last tracing Confirmed by Randol Simmonds [phone]) on 11/08/2023 5:30:50 PM  Radiology: CT Angio Chest Pulmonary Embolism (PE) W or WO Contrast Result Date: 11/08/2023 CLINICAL DATA:  Possible abnormal chest x-ray asthma exacerbation EXAM: CT ANGIOGRAPHY CHEST WITH CONTRAST TECHNIQUE: Multidetector CT imaging of the chest was performed using the standard protocol during bolus administration of intravenous contrast. Multiplanar CT image reconstructions and MIPs were obtained to evaluate the vascular anatomy. RADIATION DOSE REDUCTION: This exam was performed according to the departmental dose-optimization program which includes automated exposure control, adjustment of the mA and/or kV according to patient size and/or use of iterative reconstruction technique. CONTRAST:  75mL OMNIPAQUE  IOHEXOL  350 MG/ML SOLN COMPARISON:  Chest x-ray 11/06/2023, CT chest 10/28/2019  FINDINGS: Cardiovascular: Satisfactory opacification  of the pulmonary arteries to the segmental level. No evidence of pulmonary embolism. Normal heart size. No pericardial effusion. Nonaneurysmal aorta. No dissection is seen. Thickened appearance of the left ventricle. Mediastinum/Nodes: No enlarged mediastinal, hilar, or axillary lymph nodes. Thyroid gland, trachea, and esophagus demonstrate no significant findings. Lungs/Pleura: Lungs are clear. No pleural effusion or pneumothorax. Upper Abdomen: No acute abnormality. Musculoskeletal: No chest wall abnormality. No acute or significant osseous findings. Review of the MIP images confirms the above findings. IMPRESSION: 1. Negative for acute pulmonary embolus or aortic dissection. Clear lung fields. 2. Thickened appearance of the left ventricle, this may be correlated with echocardiography. Electronically Signed   By: Luke Bun M.D.   On: 11/08/2023 19:55     Procedures   Medications Ordered in the ED  methylPREDNISolone  sodium succinate (SOLU-MEDROL ) 125 mg/2 mL injection 125 mg (125 mg Intravenous Given 11/08/23 2009)  ipratropium-albuterol  (DUONEB) 0.5-2.5 (3) MG/3ML nebulizer solution 3 mL (3 mLs Nebulization Given 11/08/23 2007)  iohexol  (OMNIPAQUE ) 350 MG/ML injection 75 mL (75 mLs Intravenous Contrast Given 11/08/23 1916)  ipratropium-albuterol  (DUONEB) 0.5-2.5 (3) MG/3ML nebulizer solution 3 mL (3 mLs Nebulization Given 11/08/23 2047)                                    Medical Decision Making Amount and/or Complexity of Data Reviewed Labs: ordered. Radiology: ordered.  Risk Prescription drug management.   This patient presents to the ED with chief complaint(s) of SOB .  The complaint involves an extensive differential diagnosis and also carries with it a high risk of complications and morbidity.   Pertinent past medical history as listed in HPI  The differential diagnosis includes  Asthma, pneumothorax, ACS, URI  pneumonia  Additional history obtained: Records reviewed Care Everywhere/External Records  Assessment and management:   Patient presents hypertensive with complaints of shortness of breath for the past 2 weeks.  She has a history of asthma and states that this feels similar.  She has been coughing but does not feel sick.  Is without congestion or fevers.  Has no chest pain or cardiac history.  No prior blood clots.  Was evaluated by her pulmonologist.  Chest x-ray was concerning for possible pneumothorax and she was sent here for CT scan.  POCUS equivocal for sliding on the right side.  Will obtain imaging and labs.  She does have wheezing on exam we will give a breathing treatment.  Patient reports improvement of her symptoms.  Workup overall reassuring.  She is interested in going home with follow-up with her pulmonologist.  She still has a few days of p.o. steroids she will complete.  Independent ECG interpretation:  Sinus rhythm  Independent labs interpretation:  The following labs were independently interpreted:  BMP with elevated creatinine, near baseline, troponin without elevation, BNP within normal limits  Independent visualization and interpretation of imaging: I independently visualized the following imaging with scope of interpretation limited to determining acute life threatening conditions related to emergency care:  CT chest negative for PE or pneumothorax   Consultations obtained:   none  Disposition:   Patient will be discharged home. The patient has been appropriately medically screened and/or stabilized in the ED. I have low suspicion for any other emergent medical condition which would require further screening, evaluation or treatment in the ED or require inpatient management. At time of discharge the patient is hemodynamically stable and in no acute distress.  I have discussed work-up results and diagnosis with patient and answered all questions. Patient is agreeable  with discharge plan. We discussed strict return precautions for returning to the emergency department and they verbalized understanding.     Social Determinants of Health:   none  This note was dictated with voice recognition software.  Despite best efforts at proofreading, errors may have occurred which can change the documentation meaning.       Final diagnoses:  SOB (shortness of breath)    ED Discharge Orders     None          Donnajean Lynwood VEAR DEVONNA 11/08/23 2107    Randol Simmonds, MD 11/09/23 1128

## 2023-11-09 ENCOUNTER — Other Ambulatory Visit (HOSPITAL_COMMUNITY): Payer: Self-pay

## 2023-11-19 ENCOUNTER — Other Ambulatory Visit: Payer: Self-pay

## 2023-11-19 ENCOUNTER — Ambulatory Visit (INDEPENDENT_AMBULATORY_CARE_PROVIDER_SITE_OTHER): Admitting: Family Medicine

## 2023-11-19 ENCOUNTER — Other Ambulatory Visit (HOSPITAL_COMMUNITY): Payer: Self-pay

## 2023-11-19 ENCOUNTER — Encounter: Payer: Self-pay | Admitting: Family Medicine

## 2023-11-19 VITALS — BP 144/94 | HR 79 | Temp 98.0°F | Ht 63.0 in | Wt 233.0 lb

## 2023-11-19 DIAGNOSIS — I517 Cardiomegaly: Secondary | ICD-10-CM

## 2023-11-19 DIAGNOSIS — Z7985 Long-term (current) use of injectable non-insulin antidiabetic drugs: Secondary | ICD-10-CM

## 2023-11-19 DIAGNOSIS — E559 Vitamin D deficiency, unspecified: Secondary | ICD-10-CM | POA: Diagnosis not present

## 2023-11-19 DIAGNOSIS — N1831 Chronic kidney disease, stage 3a: Secondary | ICD-10-CM | POA: Diagnosis not present

## 2023-11-19 DIAGNOSIS — J4550 Severe persistent asthma, uncomplicated: Secondary | ICD-10-CM | POA: Diagnosis not present

## 2023-11-19 DIAGNOSIS — E66813 Obesity, class 3: Secondary | ICD-10-CM

## 2023-11-19 DIAGNOSIS — E119 Type 2 diabetes mellitus without complications: Secondary | ICD-10-CM

## 2023-11-19 DIAGNOSIS — J3089 Other allergic rhinitis: Secondary | ICD-10-CM | POA: Diagnosis not present

## 2023-11-19 DIAGNOSIS — I10 Essential (primary) hypertension: Secondary | ICD-10-CM | POA: Diagnosis not present

## 2023-11-19 DIAGNOSIS — Z6841 Body Mass Index (BMI) 40.0 and over, adult: Secondary | ICD-10-CM | POA: Diagnosis not present

## 2023-11-19 DIAGNOSIS — Z9109 Other allergy status, other than to drugs and biological substances: Secondary | ICD-10-CM

## 2023-11-19 DIAGNOSIS — E1122 Type 2 diabetes mellitus with diabetic chronic kidney disease: Secondary | ICD-10-CM

## 2023-11-19 DIAGNOSIS — E538 Deficiency of other specified B group vitamins: Secondary | ICD-10-CM

## 2023-11-19 MED ORDER — TIRZEPATIDE 12.5 MG/0.5ML ~~LOC~~ SOAJ
12.5000 mg | SUBCUTANEOUS | 1 refills | Status: DC
Start: 2023-11-19 — End: 2023-11-19
  Filled 2023-11-19: qty 2, 28d supply, fill #0

## 2023-11-19 MED ORDER — BD INTEGRA SYRINGE 25G X 1" 3 ML MISC
1 refills | Status: AC
  Filled 2023-11-19 (×2): qty 50, 50d supply, fill #0
  Filled 2024-03-06: qty 3, 84d supply, fill #0

## 2023-11-19 MED ORDER — VITAMIN D (ERGOCALCIFEROL) 1.25 MG (50000 UNIT) PO CAPS
50000.0000 [IU] | ORAL_CAPSULE | ORAL | 0 refills | Status: DC
Start: 2023-11-19 — End: 2024-02-06
  Filled 2023-11-19: qty 12, 84d supply, fill #0

## 2023-11-19 MED ORDER — CYANOCOBALAMIN 1000 MCG/ML IJ SOLN
1000.0000 ug | INTRAMUSCULAR | 6 refills | Status: AC
  Filled 2023-11-19: qty 1, 30d supply, fill #0
  Filled 2024-03-06: qty 1, 30d supply, fill #1

## 2023-11-19 MED ORDER — TIRZEPATIDE 12.5 MG/0.5ML ~~LOC~~ SOAJ
12.5000 mg | SUBCUTANEOUS | 2 refills | Status: DC
  Filled 2023-11-19: qty 2, 28d supply, fill #0

## 2023-11-19 NOTE — Patient Instructions (Signed)
 Referrals to Dr Margaretann and [facility]  made  Continue to work on increasing daily steps Hydrate well with water Think about adding in more lean protein and fiber (fruits and veggies)  Continue B12 once monthly  SEEQ clear protein powder  Continue RX vitamin D  weekly  Limit the candy!

## 2023-11-19 NOTE — Progress Notes (Signed)
 Office: [phone]  /  Fax: [phone]  WEIGHT SUMMARY AND BIOMETRICS  Starting Date: 12/12/21  Starting Weight: 230lb   Weight Lost Since Last Visit: 0lb   Vitals Temp: 98 F (36.7 C) BP: (!) 144/94 Pulse Rate: 79 SpO2: 99 %   Body Composition  Body Fat %: 49.3 % Fat Mass (lbs): 114.8 lbs Muscle Mass (lbs): 112.2 lbs Total Body Water (lbs): 93.8 lbs Visceral Fat Rating : 16    HPI  Chief Complaint: OBESITY  [name] is here to discuss her progress with her obesity treatment plan. She is on the keeping a food journal and adhering to recommended goals of 1700 calories and 100 protein and states she is following her eating plan approximately 80 % of the time. She states she is exercising 0 minutes 0 times per week.  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 Body mass index is 41.27 kg/m.  General: She is overweight, cooperative, alert, well developed, and in no acute distress. PSYCH: Has normal mood, affect and thought process.   Lungs: Normal breathing effort, no conversational dyspnea.   ASSESSMENT AND PLAN  TREATMENT PLAN FOR OBESITY:  Recommended Dietary Goals  [name] is currently in the action stage of change. As such, her goal is to continue weight  management plan. She has agreed to keeping a food journal and adhering to recommended goals of 1600 calories and 90+ grams of protein and following a lower carbohydrate, vegetable and lean protein rich diet plan.  Behavioral Intervention  We discussed the following Behavioral Modification Strategies today: increasing lean protein intake to established goals, increasing fiber rich foods, increasing water intake , work on meal planning and preparation, keeping healthy foods at home, identifying sources and decreasing liquid calories, decreasing eating out or consumption of processed foods, and making healthy choices when eating convenient foods, avoiding temptations and identifying enticing environmental cues, planning for success, and continue to work on maintaining a reduced calorie state, getting the recommended amount of protein, incorporating whole foods, making healthy choices, staying well hydrated and practicing mindfulness when eating.. Cut out the candy Avoid sugar sweetened beverages  Additional resources provided today: NA  Recommended Physical Activity Goals  [name] has been advised to work up to 150 minutes of moderate intensity aerobic activity a week and strengthening exercises 2-3 times per week for cardiovascular health, weight loss maintenance and preservation of muscle mass.   She has agreed to Think about enjoyable ways to increase daily physical activity and overcoming barriers to exercise and Increase physical activity in their day and reduce sedentary time (increase NEAT). Okay to slowly increase indoor exercise  Pharmacotherapy changes for the treatment of obesity: None  ASSOCIATED CONDITIONS ADDRESSED TODAY  Essential hypertension Tinika continues to run elevated blood pressures though at work  she reportedly runs 130s over 80s. She is currently on diltiazem  360 mg daily with some side effects of dizziness, losartan /HCTZ 50/12.5 mg daily.  Her CT chest done on 11/08/2023  revealed a thickened left ventricle.  She has a history of LVH on her last echocardiogram with [name] from 2021 and has not been seen since.  To help manage her hypertension and reevaluate LVH with echocardiogram, referral back to [name] has been made. -     Ambulatory referral to Cardiology  Vitamin D  deficiency Last vitamin D  Lab Results  Component Value Date   VD25OH 35.8 09/25/2023   Reviewed labs from last visit.  Her vitamin D  level still remains low normal on vitamin D  50,000 IU once weekly.  Continue vitamin D  50,000 IU once a weekly along with a multivitamin daily. -     Vitamin D  (Ergocalciferol ); Take 1 capsule (50,000 Units total) by mouth every 7 (seven) days.  Dispense: 12 capsule; Refill: 0  Type 2 diabetes mellitus without complication, without long-term current use of insulin  ([name]) Lab Results  Component Value Date   HGBA1C 5.9 (H) 09/25/2023  Her A1c shows good control of type 2 diabetes at 5.9 on Mounjaro  12.5 mg once weekly injection.  Given her CKD 3 and today, avoid use of metformin .  Continue to work on reducing added sugar and starches while increasing walking time as tolerated with her asthma.  Consider referral to registered dietitian. -     Tirzepatide ; Inject 12.5 mg into the skin once a week.  Dispense: 2 mL; Refill: 2  Vitamin B12 deficiency Lab Results  Component Value Date   VITAMINB12 287 09/25/2023  Resume vitamin B12 1000 mcg once monthly injection.  Without injectable B12, her B12 has reduced drastically over time.  She has some complaints of fatigue.  She denies brain fog or paresthesias.  Repeat vitamin B12 level in the next 5 to 6 months -     Cyanocobalamin ; Inject 1 mL (1,000 mcg total) into the muscle every 30 (thirty) days.  Dispense: 1 mL; Refill: 6 -     BD Disp Needle; Use as directed monthly for B12 injections  Dispense: 50 each; Refill: 1  LVH (left ventricular hypertrophy) Denies chest pain, dyspnea on exertion or leg edema -      Ambulatory referral to Cardiology  Allergic asthma, severe persistent, uncomplicated Her asthma is managed by [name].  She is scheduled for a follow-up visit on 12/19/2023.  Keep upcoming visit and take all prescribed medications.  Reviewed ED notes from her asthma exacerbation dated 11/08/2023.  Rescue steroids have made it more difficult for weight reduction. -     Ambulatory referral to Allergy   Environmental allergies She requests referral to an allergist.  Referral made to [facility] .  She does have an allergic component to her asthma. -     Ambulatory referral to Allergy   Class 3 severe obesity due to excess calories with serious comorbidity and body mass index (BMI) of 40.0 to 44.9 in adult  Stage 3a chronic kidney disease ([name]) Managed by [name].  Renal function has been stable.  BMP reviewed from 11/08/2023 with a creatinine of 1.40 and a GFR of 44.  Blood pressure normal today elevated.  She is planning edema.  Avoid use of nephrotoxic medications.     She was informed of the importance of frequent follow up visits to maximize her success with intensive lifestyle modifications for her multiple health conditions.  ATTESTASTION STATEMENTS:  Reviewed by clinician on day of visit: allergies, medications, problem list, medical history, surgical history, family history, social history, and previous encounter notes pertinent to obesity diagnosis.   I have personally spent 35 minutes total time today in preparation, patient care, nutritional counseling and education,  and documentation for this visit, including the following: review of most recent clinical lab tests, prescribing medications/ refilling medications, reviewing medical assistant documentation, review and interpretation of bioimpedence results.     Darice Haddock, D.O. DABFM, DABOM [facility] [address] [place], [place] [address] [phone]

## 2023-11-26 ENCOUNTER — Telehealth: Payer: Self-pay

## 2023-11-26 ENCOUNTER — Encounter: Payer: Self-pay | Admitting: Gastroenterology

## 2023-11-26 NOTE — Telephone Encounter (Signed)
 Patient is a former patient of Dr. Teressa.  She called in today to report hemorrhoid pain and difficulty sitting and walking due to an external hemorrhoid.  She has intermittent bleeding from the hemorrhoid with a BM.  Patient is requesting an appointment.  Patient is advised to try Recticare PRN applied to the area.  She is also instructed to insert Prep H suppositories with a small amount of OTC hydrocortisone cream BID.  She has been scheduled for an appointment with Dr. Leigh to discuss a hemorrhoid banding on 8/21 4:00. She was also instructed to  Soak in tub of warm water/sitz bath 10 minutes BID, Tucs pads, meticulous cleaning between bowel movements. She is asked to call back if she has any further questions or concerns.

## 2023-11-27 ENCOUNTER — Encounter

## 2023-11-29 ENCOUNTER — Other Ambulatory Visit (HOSPITAL_COMMUNITY): Payer: Self-pay

## 2023-12-04 ENCOUNTER — Ambulatory Visit: Admitting: Internal Medicine

## 2023-12-04 DIAGNOSIS — J4550 Severe persistent asthma, uncomplicated: Secondary | ICD-10-CM

## 2023-12-04 DIAGNOSIS — J4541 Moderate persistent asthma with (acute) exacerbation: Secondary | ICD-10-CM

## 2023-12-04 LAB — PULMONARY FUNCTION TEST
DL/VA % pred: 104 %
DL/VA: 4.47 ml/min/mmHg/L
DLCO unc % pred: 88 %
DLCO unc: 17.57 ml/min/mmHg
FEF 25-75 Post: 3.07 L/s
FEF 25-75 Pre: 2.67 L/s
FEF2575-%Change-Post: 15 %
FEF2575-%Pred-Post: 124 %
FEF2575-%Pred-Pre: 108 %
FEV1-%Change-Post: 3 %
FEV1-%Pred-Post: 88 %
FEV1-%Pred-Pre: 85 %
FEV1-Post: 2.27 L
FEV1-Pre: 2.2 L
FEV1FVC-%Change-Post: -1 %
FEV1FVC-%Pred-Pre: 107 %
FEV6-%Change-Post: 4 %
FEV6-%Pred-Post: 85 %
FEV6-%Pred-Pre: 81 %
FEV6-Post: 2.72 L
FEV6-Pre: 2.59 L
FEV6FVC-%Change-Post: 0 %
FEV6FVC-%Pred-Post: 103 %
FEV6FVC-%Pred-Pre: 103 %
FVC-%Change-Post: 4 %
FVC-%Pred-Post: 82 %
FVC-%Pred-Pre: 79 %
FVC-Post: 2.72 L
FVC-Pre: 2.6 L
Post FEV1/FVC ratio: 83 %
Post FEV6/FVC ratio: 100 %
Pre FEV1/FVC ratio: 85 %
Pre FEV6/FVC Ratio: 100 %

## 2023-12-04 NOTE — Progress Notes (Signed)
 Pre/post spiro and DLCO performed today.

## 2023-12-04 NOTE — Patient Instructions (Signed)
 Pre/post spiro and DLCO performed today.

## 2023-12-12 ENCOUNTER — Ambulatory Visit (INDEPENDENT_AMBULATORY_CARE_PROVIDER_SITE_OTHER): Admitting: Gastroenterology

## 2023-12-12 ENCOUNTER — Encounter: Payer: Self-pay | Admitting: Gastroenterology

## 2023-12-12 VITALS — BP 134/94 | HR 84 | Ht 62.5 in | Wt 239.0 lb

## 2023-12-12 DIAGNOSIS — Z79899 Other long term (current) drug therapy: Secondary | ICD-10-CM | POA: Diagnosis not present

## 2023-12-12 DIAGNOSIS — K6289 Other specified diseases of anus and rectum: Secondary | ICD-10-CM | POA: Diagnosis not present

## 2023-12-12 DIAGNOSIS — J45909 Unspecified asthma, uncomplicated: Secondary | ICD-10-CM

## 2023-12-12 DIAGNOSIS — K645 Perianal venous thrombosis: Secondary | ICD-10-CM | POA: Diagnosis not present

## 2023-12-12 DIAGNOSIS — K219 Gastro-esophageal reflux disease without esophagitis: Secondary | ICD-10-CM | POA: Diagnosis not present

## 2023-12-12 MED ORDER — CALMOL-4 76-10 % RE SUPP: RECTAL | Status: DC

## 2023-12-12 NOTE — Patient Instructions (Addendum)
 We have given you samples of the following medication: Calmol 4 suppositories - use as directed   Please purchase the following medications over the counter and use as directed: Hydrocortisone cream  You can try Sitz Baths - we will give you a handout today   Thank you for entrusting me with your care and for choosing [facility], Dr. Elspeth Naval    _______________________________________________________  If your blood pressure at your visit was 140/90 or greater, please contact your primary care physician to follow up on this.  _______________________________________________________  If you are age 58 or older, your body mass index should be between 23-30. Your Body mass index is 43.02 kg/m. If this is out of the aforementioned range listed, please consider follow up with your Primary Care Provider.  If you are age 65 or younger, your body mass index should be between 19-25. Your Body mass index is 43.02 kg/m. If this is out of the aformentioned range listed, please consider follow up with your Primary Care Provider.   ________________________________________________________  The [facility] providers would like to encourage you to use MYCHART to communicate with providers for non-urgent requests or questions.  Due to long hold times on the telephone, sending your provider a message by [facility] may be a faster and more efficient way to get a response.  Please allow 48 business hours for a response.  Please remember that this is for non-urgent requests.  _______________________________________________________  [facility] is using a team-based approach to care.  Your team is made up of your doctor and two to three APPS. Our APPS (Nurse Practitioners and Physician Assistants) work with your physician to ensure care continuity for you. They are fully qualified to address your health concerns and develop a treatment plan. They communicate directly with your  gastroenterologist to care for you. Seeing the Advanced Practice Practitioners on your physician's team can help you by facilitating care more promptly, often allowing for earlier appointments, access to diagnostic testing, procedures, and other specialty referrals.
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
## 2023-12-16 NOTE — Progress Notes (Unsigned)
 Subjective:    Patient ID: [name], female    DOB: Jan 02, 1966, 58 y.o.   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 FVC 2.39/87%, FEV1 2.04/89%, FEV1/FVC 2.04, FEF 25-75 percent 2.59/87%. Unattended Home Sleep Test 07/15/16-AHI 13.9/hour, desaturation to 74%, body weight 240 pounds Resp Allergy  Profile 06/29/16- Pos dog and timothy grass, IgE 41 HST 10/07/17-AHI 6.9/hour, desaturation to 81%, body weight 249 pounds PFT 12/04/23- Pulmonary Function Diagnosis: Normal Pulmonary Function Insignificant respons to bronchodilattor Normal Diffusion  -----------------------------------------------------------------------.   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 Recently got her Doctorate in Nursing- congratulations !! She had gone to ER in mid-November with asthma exacerbation. Wanted to avoid being sent for admission so she didn't call us . Discussed the use of AI scribe software for clinical note transcription with the patient, who gave verbal consent to proceed.    HPI Recent visits in [place] with Dr Brock:  Patient is a pleasant 58 year old female, Patient of Dr. Reggy Salt in a our clinic, and with a past medical history of ast has followed with Dr. Salt for asthma and has been maintained on Symbicort  and montelukast .  She also has nebulizers that she uses. Today, she continues to feel  short of breath and has a cough but the wheezing is improved. She does not have significant chest tightness.  She reports using her Symbicort  and tells me that she has plenty of drug samples that she has been using. I am unable to locate the Symbicort  in the dispense history.  -ICS/LABA re-initiated with advair  HFA  -CXR obtained > no PNAthma presenting for acute visit.  Acute Visit 09/25/2023: Patient has been having symptoms of shortness of breath, cough, and a wheeze prompting her to be seen in the ED on 09/09/2023.  She was prescribed oral steroids in addition to nebulizers with improvement in her symptoms.  Patien  -unable to perform FENO  -PFT's ordered  -felt no role for steroids  Acute Visit 11/06/2023: Again presents with acute symptoms. She now reports symptoms of cough, wheeze, and shortness of breath for a week. She is coughing during our clinic visit. She has yet to start her dupixant.   Patient reports diagnosis of asthma since at least 2008.  She reports having been admitted and hospitalized with intubation secondary to an asthma exacerbation many years ago around the time of her initial diagnosis by Dr. Tamea.  She was subsequently followed by Dr. Salt and has had multiple exacerbations of asthma requiring prednisone  therapy.  She was previously trialed on Xolair but reports an anaphylactic reaction and only used it once.  She was prescribed Dupixent  by Dr. Salt but she is afraid to start it given her previous episode of angioedema.    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 Negative for acute pulmonary embolus or aortic dissection. Clear lung fields. 2. Thickened appearance of the left ventricle, this may be correlated with echocardiography. Discussed the use of AI scribe software for clinical note transcription with the patient, who gave verbal consent to proceed. Discussed the use of AI scribe software for clinical note transcription with the patient, who gave verbal consent to proceed.  History of Present Illness   [name] is a 58 year old female with asthma who presents for follow-up regarding her respiratory management.  She recently sought care in [place] due to her regular provider's unavailability and requested a steroid injection, which was not provided. Her medication was changed from Symbicort  to Advair , with no significant change in symptoms.  A recent chest CT on November 08, 2023, ruled out a suspected tension pneumothorax. She also completed a pulmonary function test approximately two weeks ago.  She is preparing for a trip to [place] in September and plans to take a dose pack and her nebulizer to manage potential asthma exacerbations. Her current medications include Tessalon  Pearls, which she finds ineffective, and Tussionex, for which she may need a refill.     Assessment and Plan:    Asthma moderately severe uncomplicated Asthma management stable with Advair . Steroid injections or dose packs effective for exacerbations. Pulmonary function tests show well-controlled asthma. Concern about exacerbations during [place] trip. - Provide dose pack for [place] trip. - Refill Tessalon  Pearls and Tussionex for cough.  Gastroesophageal reflux disease (GERD) On Nexium  for GERD. GI consultation raised concerns about long-term Nexium  use, suggesting renal and dementia risks. Recommended to adjust Nexium  intake. - Adjust Nexium  to every other day or as needed, especially during  asthma exacerbations.    Obesity -keep trying History of Present Illness  ROS-see HPI    + = positive Constitutional:     weight loss, night sweats, fevers, chills, +fatigue, lassitude. HEENT:   +headaches, difficulty swallowing, tooth/dental problems, sore throat,       No-  sneezing, itching, ear ache, nasal congestion, post nasal drip,  CV:    chest pain, orthopnea, PND, swelling in lower extremities, anasarca,  dizziness, +palpitations Resp: + shortness of breath with exertion or at rest.                 productive cough, non-productive cough,  No- coughing up of blood.                  change in color of mucus.  wheezing.   Skin: No-   rash or lesions. GI:  No-   heartburn, indigestion, abdominal pain, nausea, vomiting,  GU:  MS:  No-   joint pain or swelling.   Neuro-     nothing unusual Psych:  No- change in mood or affect. + depression or anxiety.  No memory loss.  OBJ- Physical Exam General- Alert, Oriented, Affect -appropriatel, Distress- none acute,  + morbidly obese Skin- rash-none, lesions- none, excoriation- none Lymphadenopathy- none Head- atraumatic            Eyes- Gross vision intact, PERRLA, conjunctivae and secretions clear            Ears- Hearing, canals-normal  Nose-  no-Septal dev, mucus, polyps, erosion, perforation             Throat- Mallampati III , mucosa clear , drainage- none, tonsils- atrophic Neck- flexible , trachea midline, no stridor , thyroid nl, carotid no bruit Chest - symmetrical excursion , unlabored           Heart/CV- RRR , no murmur , no gallop  , no rub, nl s1 s2                           - JVD- none , edema- none, stasis changes- none, varices- none           Lung-  +clear, wheeze -none, cough-none, dullness-none, rub- none           Chest wall-  Abd-  Br/ Gen/ Rectal- Not done, not indicated Extrem- cyanosis- none, clubbing, none, atrophy- none, strength- nl, cane Neuro- grossly intact to observation    Assessment &  Plan:

## 2023-12-17 ENCOUNTER — Other Ambulatory Visit (HOSPITAL_COMMUNITY): Payer: Self-pay

## 2023-12-19 ENCOUNTER — Encounter: Payer: Self-pay | Admitting: Internal Medicine

## 2023-12-19 ENCOUNTER — Ambulatory Visit (INDEPENDENT_AMBULATORY_CARE_PROVIDER_SITE_OTHER): Payer: Commercial Managed Care - PPO | Admitting: Internal Medicine

## 2023-12-19 ENCOUNTER — Other Ambulatory Visit (HOSPITAL_COMMUNITY): Payer: Self-pay

## 2023-12-19 VITALS — BP 128/78 | HR 91 | Temp 98.0°F | Ht 63.0 in | Wt 238.0 lb

## 2023-12-19 DIAGNOSIS — J4541 Moderate persistent asthma with (acute) exacerbation: Secondary | ICD-10-CM

## 2023-12-19 MED ORDER — BENZONATATE 200 MG PO CAPS
200.0000 mg | ORAL_CAPSULE | Freq: Three times a day (TID) | ORAL | 3 refills | Status: AC | PRN
  Filled 2023-12-19: qty 20, 7d supply, fill #0

## 2023-12-19 MED ORDER — HYDROCOD POLI-CHLORPHE POLI ER 10-8 MG/5ML PO SUER
5.0000 mL | Freq: Two times a day (BID) | ORAL | 0 refills | Status: AC | PRN
  Filled 2023-12-19: qty 115, 12d supply, fill #0

## 2023-12-19 MED ORDER — PREDNISONE 10 MG PO TABS
ORAL_TABLET | ORAL | 0 refills | Status: DC
  Filled 2023-12-19: qty 20, 8d supply, fill #0

## 2023-12-19 NOTE — Patient Instructions (Signed)
 Refill meds sent as discussed

## 2023-12-20 ENCOUNTER — Ambulatory Visit
Admission: RE | Admit: 2023-12-20 | Discharge: 2023-12-20 | Disposition: A | Discharge status: Home / Self Care | Source: Ambulatory Visit | Attending: Obstetrics and Gynecology | Admitting: Obstetrics and Gynecology

## 2023-12-20 DIAGNOSIS — Z1231 Encounter for screening mammogram for malignant neoplasm of breast: Secondary | ICD-10-CM | POA: Diagnosis not present

## 2024-01-08 ENCOUNTER — Ambulatory Visit: Admitting: Gastroenterology

## 2024-01-08 NOTE — Progress Notes (Deleted)
 [name]

## 2024-02-03 DIAGNOSIS — N1831 Chronic kidney disease, stage 3a: Secondary | ICD-10-CM | POA: Diagnosis not present

## 2024-02-04 ENCOUNTER — Ambulatory Visit: Admitting: Family Medicine

## 2024-02-05 ENCOUNTER — Other Ambulatory Visit (HOSPITAL_COMMUNITY): Payer: Self-pay

## 2024-02-05 ENCOUNTER — Ambulatory Visit: Admitting: Family Medicine

## 2024-02-06 ENCOUNTER — Telehealth (INDEPENDENT_AMBULATORY_CARE_PROVIDER_SITE_OTHER): Admitting: Family Medicine

## 2024-02-06 ENCOUNTER — Other Ambulatory Visit: Payer: Self-pay | Admitting: Family Medicine

## 2024-02-06 ENCOUNTER — Encounter: Payer: Self-pay | Admitting: Family Medicine

## 2024-02-06 ENCOUNTER — Other Ambulatory Visit (HOSPITAL_COMMUNITY): Payer: Self-pay

## 2024-02-06 VITALS — Ht 63.0 in | Wt 228.0 lb

## 2024-02-06 DIAGNOSIS — E1122 Type 2 diabetes mellitus with diabetic chronic kidney disease: Secondary | ICD-10-CM

## 2024-02-06 DIAGNOSIS — Z7985 Long-term (current) use of injectable non-insulin antidiabetic drugs: Secondary | ICD-10-CM

## 2024-02-06 DIAGNOSIS — M1990 Unspecified osteoarthritis, unspecified site: Secondary | ICD-10-CM | POA: Diagnosis not present

## 2024-02-06 DIAGNOSIS — E66813 Obesity, class 3: Secondary | ICD-10-CM | POA: Diagnosis not present

## 2024-02-06 DIAGNOSIS — J4550 Severe persistent asthma, uncomplicated: Secondary | ICD-10-CM | POA: Diagnosis not present

## 2024-02-06 DIAGNOSIS — E538 Deficiency of other specified B group vitamins: Secondary | ICD-10-CM | POA: Diagnosis not present

## 2024-02-06 DIAGNOSIS — N1831 Chronic kidney disease, stage 3a: Secondary | ICD-10-CM | POA: Diagnosis not present

## 2024-02-06 DIAGNOSIS — I517 Cardiomegaly: Secondary | ICD-10-CM | POA: Diagnosis not present

## 2024-02-06 DIAGNOSIS — I129 Hypertensive chronic kidney disease with stage 1 through stage 4 chronic kidney disease, or unspecified chronic kidney disease: Secondary | ICD-10-CM | POA: Diagnosis not present

## 2024-02-06 DIAGNOSIS — E669 Obesity, unspecified: Secondary | ICD-10-CM | POA: Diagnosis not present

## 2024-02-06 DIAGNOSIS — Z6841 Body Mass Index (BMI) 40.0 and over, adult: Secondary | ICD-10-CM | POA: Diagnosis not present

## 2024-02-06 DIAGNOSIS — E559 Vitamin D deficiency, unspecified: Secondary | ICD-10-CM

## 2024-02-06 DIAGNOSIS — E119 Type 2 diabetes mellitus without complications: Secondary | ICD-10-CM

## 2024-02-06 MED ORDER — B-12 (METHYLCOBALAMIN) 1000 MCG SL SUBL
SUBLINGUAL_TABLET | SUBLINGUAL | 3 refills | Status: AC
  Filled 2024-03-06: qty 90, fill #0

## 2024-02-06 MED ORDER — TIRZEPATIDE 12.5 MG/0.5ML ~~LOC~~ SOAJ
12.5000 mg | SUBCUTANEOUS | 2 refills | Status: DC
  Filled 2024-02-06: qty 2, 28d supply, fill #0
  Filled 2024-03-06: qty 2, 28d supply, fill #1

## 2024-02-06 MED ORDER — VITAMIN D (ERGOCALCIFEROL) 1.25 MG (50000 UNIT) PO CAPS
50000.0000 [IU] | ORAL_CAPSULE | ORAL | 0 refills | Status: DC
  Filled 2024-02-06: qty 12, 84d supply, fill #0

## 2024-02-06 NOTE — Progress Notes (Signed)
 Office: [phone]  /  Fax: [phone]  WEIGHT SUMMARY AND BIOMETRICS  No data recorded No data recorded  No data recorded  No data recorded No data recorded  I connected with  [name] on 02/06/24 by a video and audio enabled telemedicine application and verified that I am speaking with the correct person using two identifiers.  Patient Location: Other:  office, alone  Provider Location: Office/Clinic  I discussed the limitations of evaluation and management by telemedicine. The patient expressed understanding and agreed to proceed.  HPI  Chief Complaint: OBESITY  [name] is here to discuss her progress with her obesity treatment plan. She is on the keeping a food journal and adhering to recommended goals of 1600 calories and 90+ g of protein and states she is following her eating plan approximately 50 % of the time. She states she is exercising 0 minutes 0 times per week.   Interval History:  Since last office visit she is down 5 lb This gives her net weight loss of 53 lb in the past 3+ years of medically supervised weight management (started working with me prior to [name]) She has good volume control at mealtime on Mounjaro  12.5 mg once weekly injection She is working to prevent meal skipping with occasional hypoglycemia She is prioritizing lean protein and fiber intake Has cut back on candy Exercise remains limited due to bilateral knee pain and asthma She continues to follow-up with [name] for chronic kidney disease Her energy level and diarrhea have improved by taking B12 1000 mcg daily Has not yet been called for her allergy  or cardiology referrals placed at her last visit in July  Pharmacotherapy: Mounjaro  12.5 mg once weekly injection  PHYSICAL EXAM:  Height 5' 3 (1.6 m), weight 228 lb (103.4 kg). Body mass index is 40.39 kg/m.  General: She is overweight, cooperative, alert, well developed, and in no acute distress. PSYCH: Has normal mood, affect  and thought process.   Lungs: Normal breathing effort, no conversational dyspnea.   ASSESSMENT AND PLAN  TREATMENT PLAN FOR OBESITY:  Recommended Dietary Goals  [name] is currently in the action stage of change. As such, her goal is to continue weight management plan. She has agreed to keeping a food journal and adhering to recommended goals of 1600 calories and 90 g of protein and practicing portion control and making smarter food choices, such as increasing vegetables and decreasing simple carbohydrates.  Behavioral Intervention  We discussed the following Behavioral Modification Strategies today: increasing fiber rich foods, increasing water intake , keeping healthy foods at home, practice mindfulness eating and understand the difference between hunger signals and cravings, work on managing stress, creating time for self-care and relaxation, and planning for success.  Additional resources provided today: NA  Recommended Physical Activity Goals  [name] has been advised to work up to 150 minutes of moderate intensity aerobic activity a week and strengthening exercises 2-3 times per week for cardiovascular health, weight loss maintenance and preservation of muscle mass.   She has agreed to Think about enjoyable ways to increase daily physical activity and overcoming barriers to exercise and Increase physical activity in their day and reduce sedentary time (increase NEAT).  Pharmacotherapy changes for the treatment of obesity: None  ASSOCIATED CONDITIONS ADDRESSED TODAY  Type 2 diabetes mellitus without complication, without long-term current use of insulin  ([name]) Lab Results  Component Value Date   HGBA1C 5.9 (H) 09/25/2023  Doing well on Mounjaro  12.5 mg once weekly injection.  She is avoiding high sugar foods and drinks.  She is working to prevent meal skipping which has caused an occasional hypoglycemic episode.  We have discussed importance of eating on the schedule and caring  healthy snacks to work.  Her exercise has been limited due to arthritis pain and asthma.  She is not routinely checking blood sugars.  Plan to repeat A1c in the next 3 months  -     Tirzepatide ; Inject 12.5 mg into the skin once a week.  Dispense: 2 mL; Refill: 2  Class 3 severe obesity due to excess calories with serious comorbidity and body mass index (BMI) of 40.0 to 44.9 in adult [name]) Improving  Stage 3a chronic kidney disease ([name]) Continue plan of care per [name].  Avoid nephrotoxic medications.  Vitamin B12 deficiency Better with B12 1000 mcg once daily and has added lean red meat once or twice a week.  Diarrhea and energy levels have improved.  Recheck B12 level next visit  LVH (left ventricular hypertrophy) She was referred to cardiology having previously seen [name].  She has not yet been called.  She remains asymptomatic.  She has not been checking her blood pressure but does have a long history of hypertension.  Continue active plan for weight reduction and continue all listed antihypertensive medications.  Allergic asthma, severe persistent, uncomplicated ([name]) She is managed by [name] for pulmonology for moderate persistent asthma.  Encouraged her to continue all of her listed medication for asthma maintenance and rescue.  She asked for an allergy  referral last visit as she does have an allergic component to her asthma and has not yet been called.  Referral was placed at her last visit.     She was informed of the importance of frequent follow up visits to maximize her success with intensive lifestyle modifications for her multiple health conditions.   ATTESTASTION STATEMENTS:  Reviewed by clinician on day of visit: allergies, medications, problem list, medical history, surgical history, family history, social history, and previous encounter notes pertinent to obesity diagnosis.   I have personally spent 30 minutes total time today in preparation, patient  care, nutritional counseling and education,  and documentation for this visit, including the following: review of most recent clinical lab tests, prescribing medications/ refilling medications, reviewing medical assistant documentation, review and interpretation of bioimpedence results.     [name], D.O. DABFM, DABOM [facility] [address] [place], [place] [address] [phone]

## 2024-02-12 ENCOUNTER — Other Ambulatory Visit (HOSPITAL_COMMUNITY): Payer: Self-pay

## 2024-03-06 ENCOUNTER — Other Ambulatory Visit: Payer: Self-pay

## 2024-03-06 ENCOUNTER — Other Ambulatory Visit (HOSPITAL_COMMUNITY): Payer: Self-pay

## 2024-03-13 ENCOUNTER — Other Ambulatory Visit: Payer: Self-pay

## 2024-03-20 ENCOUNTER — Other Ambulatory Visit: Payer: Self-pay

## 2024-03-20 ENCOUNTER — Other Ambulatory Visit (HOSPITAL_COMMUNITY): Payer: Self-pay

## 2024-03-26 NOTE — Telephone Encounter (Signed)
 NFN

## 2024-03-30 ENCOUNTER — Other Ambulatory Visit (HOSPITAL_COMMUNITY): Payer: Self-pay

## 2024-04-09 ENCOUNTER — Ambulatory Visit: Admitting: Family Medicine

## 2024-04-09 ENCOUNTER — Other Ambulatory Visit (HOSPITAL_COMMUNITY): Payer: Self-pay

## 2024-04-09 ENCOUNTER — Encounter: Payer: Self-pay | Admitting: Family Medicine

## 2024-04-09 VITALS — BP 174/101 | HR 76 | Ht 63.0 in | Wt 233.0 lb

## 2024-04-09 DIAGNOSIS — E119 Type 2 diabetes mellitus without complications: Secondary | ICD-10-CM | POA: Diagnosis not present

## 2024-04-09 DIAGNOSIS — I10 Essential (primary) hypertension: Secondary | ICD-10-CM | POA: Diagnosis not present

## 2024-04-09 DIAGNOSIS — E559 Vitamin D deficiency, unspecified: Secondary | ICD-10-CM | POA: Diagnosis not present

## 2024-04-09 DIAGNOSIS — Z7985 Long-term (current) use of injectable non-insulin antidiabetic drugs: Secondary | ICD-10-CM | POA: Diagnosis not present

## 2024-04-09 DIAGNOSIS — R110 Nausea: Secondary | ICD-10-CM | POA: Diagnosis not present

## 2024-04-09 DIAGNOSIS — E538 Deficiency of other specified B group vitamins: Secondary | ICD-10-CM

## 2024-04-09 DIAGNOSIS — Z6841 Body Mass Index (BMI) 40.0 and over, adult: Secondary | ICD-10-CM | POA: Diagnosis not present

## 2024-04-09 MED ORDER — TIRZEPATIDE 12.5 MG/0.5ML ~~LOC~~ SOAJ
12.5000 mg | SUBCUTANEOUS | 2 refills | Status: AC
  Filled 2024-04-09: qty 2, 28d supply, fill #0

## 2024-04-09 MED ORDER — VITAMIN D (ERGOCALCIFEROL) 1.25 MG (50000 UNIT) PO CAPS
50000.0000 [IU] | ORAL_CAPSULE | ORAL | 0 refills | Status: AC
  Filled 2024-04-09: qty 12, 84d supply, fill #0
  Filled ????-??-??: fill #0

## 2024-04-09 MED ORDER — ONDANSETRON 4 MG PO TBDP
4.0000 mg | ORAL_TABLET | Freq: Three times a day (TID) | ORAL | 1 refills | Status: AC | PRN
  Filled 2024-04-09: qty 30, 10d supply, fill #0

## 2024-04-09 NOTE — Progress Notes (Signed)
 Office: [phone]  /  Fax: [phone]  WEIGHT SUMMARY AND BIOMETRICS  Starting Date: 12/12/21  Starting Weight: 0lb   Weight Lost Since Last Visit: 233lb   Vitals BP: (!) 174/101 Pulse Rate: 76 SpO2: 98 %   Body Composition  Body Fat %: 48.3 % Fat Mass (lbs): 112.6 lbs Muscle Mass (lbs): 114.4 lbs Total Body Water (lbs): 89.8 lbs Visceral Fat Rating : 15     HPI  Chief Complaint: OBESITY  [name] is here to discuss her progress with her obesity treatment plan. She is on the the Category 3 Plan and states she is following her eating plan approximately 70 % of the time. She states she is exercising 30 minutes 3 times per week.   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 Body mass index is 41.27 kg/m.  General: She is overweight, cooperative, alert, well developed, and in no acute distress. PSYCH: Has normal mood, affect and thought process.   Lungs: Normal breathing effort, no conversational dyspnea.  ASSESSMENT AND PLAN  TREATMENT PLAN FOR OBESITY:  Recommended Dietary Goals  [name] is currently in the action stage of change. As such, her goal is to continue weight management plan. She has agreed  to practicing portion control and making smarter food choices, such as increasing vegetables and decreasing simple carbohydrates.  Behavioral Intervention  We discussed the following Behavioral Modification Strategies today: increasing lean protein intake to established goals, increasing fiber rich foods, increasing water intake , work on meal planning and preparation, keeping healthy foods at home, work on managing stress, creating time for self-care and relaxation, avoiding temptations and identifying enticing environmental cues, continue to practice mindfulness when eating, planning for success, and celebration eating strategies.  Additional resources provided today: NA  Recommended Physical Activity Goals  [name] has been advised to work up to 150 minutes of moderate intensity aerobic activity a week and strengthening exercises 2-3 times per week for cardiovascular health, weight loss maintenance and preservation of muscle mass.   She has agreed to Think about enjoyable ways to increase daily physical activity and overcoming barriers to exercise and Increase physical activity in their day and reduce sedentary time (increase NEAT).  Pharmacotherapy changes for the treatment of obesity: None  ASSOCIATED CONDITIONS ADDRESSED TODAY  Vitamin B12 deficiency Taking vitamin B12 1000 mcg daily Check level today -     Vitamin B12  Type 2 diabetes mellitus without complication, without long-term current use of insulin  ([name]) Lab Results  Component Value Date   HGBA1C 5.9 (H) 09/25/2023  She has adequate satiety with minimal GI side effect on Mounjaro  12.5 mg once weekly injection.  She has limited ability to exercise  due to DJD pain in knees.  She has struggled to comply with recommended dietary changes.  She is not routinely checking blood sugars.  Continue Mounjaro  2.5 mg once weekly injection.  Follow-up with PCP for routine diabetes management  -     Tirzepatide ; Inject 12.5 mg into the skin  once a week.  Dispense: 2 mL; Refill: 2 -     Comprehensive metabolic panel with GFR -     Hemoglobin A1c  Vitamin D  deficiency Last vitamin D  Lab Results  Component Value Date   VD25OH 35.8 09/25/2023   She has been taking vitamin D  50,000 IU once weekly and is due for a level today -     Vitamin D  (Ergocalciferol ); Take 1 capsule (50,000 Units total) by mouth every 7 (seven) days.  Dispense: 12 capsule; Refill: 0 -     VITAMIN D  25 Hydroxy (Vit-D Deficiency, Fractures)  Nausea -     Ondansetron ; Dissolve 1 tablet (4 mg total) by mouth every 8 (eight) hours as needed for nausea or vomiting.  Dispense: 30 tablet; Refill: 1  Morbid obesity ([name]) Her weight loss has plateaued over time.  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  We discussed the importance of proceeding with knee replacement surgery in 2026.  Her BMI is almost under 40.  Recommend follow-up with Dr. Ernie to get this scheduled next year.  Her lack of ability to exercise has affected her ability to lose more weight.  BMI 40.0-44.9, adult [facility])  Essential hypertension Blood pressure is highly elevated today.  She denies headache or chest pain.  She reports good compliance taking all of her listed antihypertensive medication and has been back to see [facility].  She denies any lower extremity edema.  Continue to monitor blood pressure readings at home and work.  Her last reading at work was in the high 120s over mid 70s.     She was informed of the importance of frequent follow up visits to maximize her success with intensive lifestyle modifications for her multiple health conditions.   ATTESTASTION STATEMENTS:  Reviewed by clinician on day of visit: allergies, medications, problem list, medical history, surgical history, family history, social history, and previous encounter notes pertinent to obesity  diagnosis.   I have personally spent 33 minutes total time today in preparation, patient care, nutritional counseling and education,  and documentation for this visit, including the following: review of most recent clinical lab tests, prescribing medications/ refilling medications, reviewing medical assistant documentation, review and interpretation of bioimpedence results.     [name], D.O. DABFM, DABOM [facility] [address] [place], [place] [address] [phone]

## 2024-04-10 LAB — COMPREHENSIVE METABOLIC PANEL WITH GFR
ALT: 17 IU/L (ref 0–32)
AST: 16 IU/L (ref 0–40)
Albumin: 4.2 g/dL (ref 3.8–4.9)
Alkaline Phosphatase: 106 IU/L (ref 49–135)
BUN/Creatinine Ratio: 15 (ref 9–23)
BUN: 19 mg/dL (ref 6–24)
Bilirubin Total: 0.3 mg/dL (ref 0.0–1.2)
CO2: 27 mmol/L (ref 20–29)
Calcium: 9.7 mg/dL (ref 8.7–10.2)
Chloride: 101 mmol/L (ref 96–106)
Creatinine, Ser: 1.3 mg/dL — ABNORMAL HIGH (ref 0.57–1.00)
Globulin, Total: 2.3 g/dL (ref 1.5–4.5)
Glucose: 77 mg/dL (ref 70–99)
Potassium: 4.6 mmol/L (ref 3.5–5.2)
Sodium: 139 mmol/L (ref 134–144)
Total Protein: 6.5 g/dL (ref 6.0–8.5)
eGFR: 48 mL/min/1.73 — ABNORMAL LOW

## 2024-04-10 LAB — VITAMIN D 25 HYDROXY (VIT D DEFICIENCY, FRACTURES): Vit D, 25-Hydroxy: 23.4 ng/mL — ABNORMAL LOW (ref 30.0–100.0)

## 2024-04-10 LAB — HEMOGLOBIN A1C
Est. average glucose Bld gHb Est-mCnc: 128 mg/dL
Hgb A1c MFr Bld: 6.1 % — ABNORMAL HIGH (ref 4.8–5.6)

## 2024-04-10 LAB — VITAMIN B12: Vitamin B-12: 254 pg/mL (ref 232–1245)

## 2024-04-13 ENCOUNTER — Other Ambulatory Visit: Payer: Self-pay

## 2024-04-13 ENCOUNTER — Other Ambulatory Visit (HOSPITAL_COMMUNITY): Payer: Self-pay

## 2024-04-20 ENCOUNTER — Ambulatory Visit: Payer: Self-pay | Admitting: Family Medicine

## 2024-04-22 ENCOUNTER — Inpatient Hospital Stay (HOSPITAL_COMMUNITY)
Admission: EM | Admit: 2024-04-22 | Discharge: 2024-04-30 | DRG: 493 | Disposition: A | Discharge status: Home / Self Care | Attending: Orthopedic Surgery | Admitting: Orthopedic Surgery

## 2024-04-22 ENCOUNTER — Emergency Department (HOSPITAL_COMMUNITY)

## 2024-04-22 ENCOUNTER — Other Ambulatory Visit: Payer: Self-pay

## 2024-04-22 ENCOUNTER — Encounter (HOSPITAL_COMMUNITY): Payer: Self-pay | Admitting: Emergency Medicine

## 2024-04-22 DIAGNOSIS — N183 Chronic kidney disease, stage 3 unspecified: Secondary | ICD-10-CM | POA: Diagnosis present

## 2024-04-22 DIAGNOSIS — E1122 Type 2 diabetes mellitus with diabetic chronic kidney disease: Secondary | ICD-10-CM | POA: Diagnosis present

## 2024-04-22 DIAGNOSIS — E669 Obesity, unspecified: Secondary | ICD-10-CM | POA: Diagnosis present

## 2024-04-22 DIAGNOSIS — D62 Acute posthemorrhagic anemia: Secondary | ICD-10-CM | POA: Diagnosis not present

## 2024-04-22 DIAGNOSIS — Z79899 Other long term (current) drug therapy: Secondary | ICD-10-CM

## 2024-04-22 DIAGNOSIS — M1990 Unspecified osteoarthritis, unspecified site: Secondary | ICD-10-CM | POA: Diagnosis present

## 2024-04-22 DIAGNOSIS — S82891B Other fracture of right lower leg, initial encounter for open fracture type I or II: Principal | ICD-10-CM

## 2024-04-22 DIAGNOSIS — K219 Gastro-esophageal reflux disease without esophagitis: Secondary | ICD-10-CM | POA: Diagnosis present

## 2024-04-22 DIAGNOSIS — Z7985 Long-term (current) use of injectable non-insulin antidiabetic drugs: Secondary | ICD-10-CM

## 2024-04-22 DIAGNOSIS — Z7951 Long term (current) use of inhaled steroids: Secondary | ICD-10-CM

## 2024-04-22 DIAGNOSIS — Z833 Family history of diabetes mellitus: Secondary | ICD-10-CM

## 2024-04-22 DIAGNOSIS — F4310 Post-traumatic stress disorder, unspecified: Secondary | ICD-10-CM | POA: Diagnosis present

## 2024-04-22 DIAGNOSIS — I10 Essential (primary) hypertension: Secondary | ICD-10-CM | POA: Diagnosis present

## 2024-04-22 DIAGNOSIS — S82871B Displaced pilon fracture of right tibia, initial encounter for open fracture type I or II: Principal | ICD-10-CM | POA: Diagnosis present

## 2024-04-22 DIAGNOSIS — Z8249 Family history of ischemic heart disease and other diseases of the circulatory system: Secondary | ICD-10-CM

## 2024-04-22 DIAGNOSIS — J45909 Unspecified asthma, uncomplicated: Secondary | ICD-10-CM | POA: Diagnosis present

## 2024-04-22 DIAGNOSIS — Z8419 Family history of other disorders of kidney and ureter: Secondary | ICD-10-CM

## 2024-04-22 DIAGNOSIS — E119 Type 2 diabetes mellitus without complications: Secondary | ICD-10-CM

## 2024-04-22 DIAGNOSIS — S82209A Unspecified fracture of shaft of unspecified tibia, initial encounter for closed fracture: Secondary | ICD-10-CM | POA: Diagnosis present

## 2024-04-22 DIAGNOSIS — G932 Benign intracranial hypertension: Secondary | ICD-10-CM | POA: Diagnosis present

## 2024-04-22 DIAGNOSIS — Y92410 Unspecified street and highway as the place of occurrence of the external cause: Secondary | ICD-10-CM

## 2024-04-22 DIAGNOSIS — Z90710 Acquired absence of both cervix and uterus: Secondary | ICD-10-CM

## 2024-04-22 DIAGNOSIS — S93431A Sprain of tibiofibular ligament of right ankle, initial encounter: Secondary | ICD-10-CM | POA: Diagnosis present

## 2024-04-22 DIAGNOSIS — I129 Hypertensive chronic kidney disease with stage 1 through stage 4 chronic kidney disease, or unspecified chronic kidney disease: Secondary | ICD-10-CM | POA: Diagnosis present

## 2024-04-22 DIAGNOSIS — S022XXA Fracture of nasal bones, initial encounter for closed fracture: Secondary | ICD-10-CM | POA: Diagnosis present

## 2024-04-22 DIAGNOSIS — M25373 Other instability, unspecified ankle: Secondary | ICD-10-CM | POA: Diagnosis present

## 2024-04-22 DIAGNOSIS — Z6839 Body mass index (BMI) 39.0-39.9, adult: Secondary | ICD-10-CM

## 2024-04-22 DIAGNOSIS — F32A Depression, unspecified: Secondary | ICD-10-CM | POA: Diagnosis present

## 2024-04-22 DIAGNOSIS — E559 Vitamin D deficiency, unspecified: Secondary | ICD-10-CM | POA: Diagnosis present

## 2024-04-22 DIAGNOSIS — Z23 Encounter for immunization: Secondary | ICD-10-CM

## 2024-04-22 MED ORDER — TETANUS-DIPHTH-ACELL PERTUSSIS 5-2-15.5 LF-MCG/0.5 IM SUSP
0.5000 mL | Freq: Once | INTRAMUSCULAR | Status: DC
  Filled 2024-04-22: qty 0.5

## 2024-04-22 MED ORDER — MORPHINE SULFATE (PF) 2 MG/ML IV SOLN
4.0000 mg | Freq: Once | INTRAVENOUS | Status: AC
  Administered 2024-04-22: 4 mg via INTRAVENOUS
  Filled 2024-04-22: qty 2

## 2024-04-22 MED ORDER — CEFAZOLIN SODIUM-DEXTROSE 1-4 GM/50ML-% IV SOLN
1.0000 g | Freq: Once | INTRAVENOUS | Status: AC
  Administered 2024-04-22: 1 g via INTRAVENOUS
  Filled 2024-04-22: qty 50

## 2024-04-22 MED ORDER — ONDANSETRON HCL 4 MG/2ML IJ SOLN
4.0000 mg | Freq: Once | INTRAMUSCULAR | Status: AC
  Administered 2024-04-22: 4 mg via INTRAVENOUS
  Filled 2024-04-22: qty 2
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
## 2024-04-23 ENCOUNTER — Encounter (HOSPITAL_COMMUNITY)
Admission: EM | Disposition: A | Discharge status: Home / Self Care | Payer: Self-pay | Source: Home / Self Care | Attending: Orthopedic Surgery

## 2024-04-23 ENCOUNTER — Emergency Department (HOSPITAL_COMMUNITY)

## 2024-04-23 ENCOUNTER — Inpatient Hospital Stay (HOSPITAL_COMMUNITY): Admitting: Certified Registered Nurse Anesthetist

## 2024-04-23 ENCOUNTER — Other Ambulatory Visit (HOSPITAL_COMMUNITY): Payer: Self-pay

## 2024-04-23 ENCOUNTER — Inpatient Hospital Stay (HOSPITAL_COMMUNITY)

## 2024-04-23 DIAGNOSIS — Z7951 Long term (current) use of inhaled steroids: Secondary | ICD-10-CM | POA: Diagnosis not present

## 2024-04-23 DIAGNOSIS — M25571 Pain in right ankle and joints of right foot: Secondary | ICD-10-CM | POA: Diagnosis present

## 2024-04-23 DIAGNOSIS — S82209A Unspecified fracture of shaft of unspecified tibia, initial encounter for closed fracture: Secondary | ICD-10-CM | POA: Diagnosis present

## 2024-04-23 DIAGNOSIS — S93431A Sprain of tibiofibular ligament of right ankle, initial encounter: Secondary | ICD-10-CM | POA: Diagnosis present

## 2024-04-23 DIAGNOSIS — Z833 Family history of diabetes mellitus: Secondary | ICD-10-CM | POA: Diagnosis not present

## 2024-04-23 DIAGNOSIS — F4310 Post-traumatic stress disorder, unspecified: Secondary | ICD-10-CM | POA: Diagnosis present

## 2024-04-23 DIAGNOSIS — K219 Gastro-esophageal reflux disease without esophagitis: Secondary | ICD-10-CM | POA: Diagnosis present

## 2024-04-23 DIAGNOSIS — Z8249 Family history of ischemic heart disease and other diseases of the circulatory system: Secondary | ICD-10-CM | POA: Diagnosis not present

## 2024-04-23 DIAGNOSIS — G932 Benign intracranial hypertension: Secondary | ICD-10-CM | POA: Diagnosis present

## 2024-04-23 DIAGNOSIS — N1831 Chronic kidney disease, stage 3a: Secondary | ICD-10-CM

## 2024-04-23 DIAGNOSIS — M1990 Unspecified osteoarthritis, unspecified site: Secondary | ICD-10-CM | POA: Diagnosis present

## 2024-04-23 DIAGNOSIS — M25373 Other instability, unspecified ankle: Secondary | ICD-10-CM | POA: Diagnosis present

## 2024-04-23 DIAGNOSIS — S82871B Displaced pilon fracture of right tibia, initial encounter for open fracture type I or II: Secondary | ICD-10-CM | POA: Diagnosis present

## 2024-04-23 DIAGNOSIS — Z8419 Family history of other disorders of kidney and ureter: Secondary | ICD-10-CM | POA: Diagnosis not present

## 2024-04-23 DIAGNOSIS — S82871A Displaced pilon fracture of right tibia, initial encounter for closed fracture: Secondary | ICD-10-CM | POA: Diagnosis not present

## 2024-04-23 DIAGNOSIS — F32A Depression, unspecified: Secondary | ICD-10-CM | POA: Diagnosis present

## 2024-04-23 DIAGNOSIS — I10 Essential (primary) hypertension: Secondary | ICD-10-CM | POA: Diagnosis not present

## 2024-04-23 DIAGNOSIS — Z79899 Other long term (current) drug therapy: Secondary | ICD-10-CM | POA: Diagnosis not present

## 2024-04-23 DIAGNOSIS — J449 Chronic obstructive pulmonary disease, unspecified: Secondary | ICD-10-CM | POA: Diagnosis not present

## 2024-04-23 DIAGNOSIS — E559 Vitamin D deficiency, unspecified: Secondary | ICD-10-CM | POA: Diagnosis present

## 2024-04-23 DIAGNOSIS — N183 Chronic kidney disease, stage 3 unspecified: Secondary | ICD-10-CM | POA: Diagnosis present

## 2024-04-23 DIAGNOSIS — D62 Acute posthemorrhagic anemia: Secondary | ICD-10-CM | POA: Diagnosis not present

## 2024-04-23 DIAGNOSIS — I129 Hypertensive chronic kidney disease with stage 1 through stage 4 chronic kidney disease, or unspecified chronic kidney disease: Secondary | ICD-10-CM | POA: Diagnosis present

## 2024-04-23 DIAGNOSIS — Z23 Encounter for immunization: Secondary | ICD-10-CM | POA: Diagnosis not present

## 2024-04-23 DIAGNOSIS — J45909 Unspecified asthma, uncomplicated: Secondary | ICD-10-CM | POA: Diagnosis present

## 2024-04-23 DIAGNOSIS — Z7985 Long-term (current) use of injectable non-insulin antidiabetic drugs: Secondary | ICD-10-CM | POA: Diagnosis not present

## 2024-04-23 DIAGNOSIS — S022XXA Fracture of nasal bones, initial encounter for closed fracture: Secondary | ICD-10-CM | POA: Diagnosis present

## 2024-04-23 DIAGNOSIS — Y92410 Unspecified street and highway as the place of occurrence of the external cause: Secondary | ICD-10-CM | POA: Diagnosis not present

## 2024-04-23 DIAGNOSIS — G4733 Obstructive sleep apnea (adult) (pediatric): Secondary | ICD-10-CM | POA: Diagnosis not present

## 2024-04-23 DIAGNOSIS — Z90710 Acquired absence of both cervix and uterus: Secondary | ICD-10-CM | POA: Diagnosis not present

## 2024-04-23 DIAGNOSIS — E1122 Type 2 diabetes mellitus with diabetic chronic kidney disease: Secondary | ICD-10-CM | POA: Diagnosis present

## 2024-04-23 DIAGNOSIS — S82872B Displaced pilon fracture of left tibia, initial encounter for open fracture type I or II: Secondary | ICD-10-CM | POA: Diagnosis not present

## 2024-04-23 DIAGNOSIS — E669 Obesity, unspecified: Secondary | ICD-10-CM | POA: Diagnosis present

## 2024-04-23 HISTORY — PX: EXTERNAL FIXATION LEG: SHX1549

## 2024-04-23 HISTORY — PX: IRRIGATION AND DEBRIDEMENT POSTERIOR HIP: SHX7265

## 2024-04-23 LAB — URINALYSIS, ROUTINE W REFLEX MICROSCOPIC
Bacteria, UA: NONE SEEN
Bilirubin Urine: NEGATIVE
Glucose, UA: 50 mg/dL — AB
Hgb urine dipstick: NEGATIVE
Ketones, ur: NEGATIVE mg/dL
Leukocytes,Ua: NEGATIVE
Nitrite: NEGATIVE
Protein, ur: 30 mg/dL — AB
Specific Gravity, Urine: 1.023 (ref 1.005–1.030)
pH: 6 (ref 5.0–8.0)

## 2024-04-23 LAB — I-STAT CHEM 8, ED
BUN: 32 mg/dL — ABNORMAL HIGH (ref 6–20)
Calcium, Ion: 1.12 mmol/L — ABNORMAL LOW (ref 1.15–1.40)
Chloride: 103 mmol/L (ref 98–111)
Creatinine, Ser: 1.5 mg/dL — ABNORMAL HIGH (ref 0.44–1.00)
Glucose, Bld: 129 mg/dL — ABNORMAL HIGH (ref 70–99)
HCT: 34 % — ABNORMAL LOW (ref 36.0–46.0)
Hemoglobin: 11.6 g/dL — ABNORMAL LOW (ref 12.0–15.0)
Potassium: 3.2 mmol/L — ABNORMAL LOW (ref 3.5–5.1)
Sodium: 141 mmol/L (ref 135–145)
TCO2: 26 mmol/L (ref 22–32)

## 2024-04-23 LAB — COMPREHENSIVE METABOLIC PANEL WITH GFR
ALT: 37 U/L (ref 0–44)
AST: 52 U/L — ABNORMAL HIGH (ref 15–41)
Albumin: 4.3 g/dL (ref 3.5–5.0)
Alkaline Phosphatase: 103 U/L (ref 38–126)
Anion gap: 12 (ref 5–15)
BUN: 33 mg/dL — ABNORMAL HIGH (ref 6–20)
CO2: 26 mmol/L (ref 22–32)
Calcium: 9.3 mg/dL (ref 8.9–10.3)
Chloride: 103 mmol/L (ref 98–111)
Creatinine, Ser: 1.34 mg/dL — ABNORMAL HIGH (ref 0.44–1.00)
GFR, Estimated: 46 mL/min — ABNORMAL LOW
Glucose, Bld: 131 mg/dL — ABNORMAL HIGH (ref 70–99)
Potassium: 3.4 mmol/L — ABNORMAL LOW (ref 3.5–5.1)
Sodium: 141 mmol/L (ref 135–145)
Total Bilirubin: 0.3 mg/dL (ref 0.0–1.2)
Total Protein: 7.4 g/dL (ref 6.5–8.1)

## 2024-04-23 LAB — ETHANOL: Alcohol, Ethyl (B): <15 mg/dL

## 2024-04-23 LAB — PROTIME-INR
INR: 1.1 (ref 0.8–1.2)
Prothrombin Time: 14.7 s (ref 11.4–15.2)

## 2024-04-23 LAB — CBC
HCT: 37 % (ref 36.0–46.0)
Hemoglobin: 11.8 g/dL — ABNORMAL LOW (ref 12.0–15.0)
MCH: 25.8 pg — ABNORMAL LOW (ref 26.0–34.0)
MCHC: 31.9 g/dL (ref 30.0–36.0)
MCV: 80.8 fL (ref 80.0–100.0)
Platelets: 377 K/uL (ref 150–400)
RBC: 4.58 MIL/uL (ref 3.87–5.11)
RDW: 14.6 % (ref 11.5–15.5)
WBC: 10.3 K/uL (ref 4.0–10.5)
nRBC: 0 % (ref 0.0–0.2)

## 2024-04-23 LAB — HIV ANTIBODY (ROUTINE TESTING W REFLEX): HIV Screen 4th Generation wRfx: NONREACTIVE

## 2024-04-23 LAB — SAMPLE TO BLOOD BANK

## 2024-04-23 LAB — I-STAT CG4 LACTIC ACID, ED: Lactic Acid, Venous: 0.8 mmol/L (ref 0.5–1.9)

## 2024-04-23 MED ORDER — CEFAZOLIN SODIUM-DEXTROSE 2-3 GM-%(50ML) IV SOLR
INTRAVENOUS | Status: DC | PRN
  Administered 2024-04-23: 2 g via INTRAVENOUS

## 2024-04-23 MED ORDER — PROPOFOL 10 MG/ML IV BOLUS
INTRAVENOUS | Status: AC
  Filled 2024-04-23: qty 20

## 2024-04-23 MED ORDER — LORAZEPAM 2 MG/ML IJ SOLN
INTRAMUSCULAR | Status: AC
  Administered 2024-04-23: 1 mg via INTRAVENOUS
  Filled 2024-04-23: qty 1

## 2024-04-23 MED ORDER — ONDANSETRON HCL 4 MG PO TABS: 4.0000 mg | ORAL_TABLET | Freq: Four times a day (QID) | ORAL | Status: DC | PRN

## 2024-04-23 MED ORDER — HYDROMORPHONE HCL 1 MG/ML IJ SOLN
0.5000 mg | INTRAMUSCULAR | Status: DC | PRN
  Administered 2024-04-23 – 2024-04-28 (×12): 0.5 mg via INTRAVENOUS
  Filled 2024-04-23 (×13): qty 0.5

## 2024-04-23 MED ORDER — LACTATED RINGERS IV SOLN: INTRAVENOUS | Status: DC | PRN

## 2024-04-23 MED ORDER — 0.9 % SODIUM CHLORIDE (POUR BTL) OPTIME
TOPICAL | Status: DC | PRN
  Administered 2024-04-23: 1000 mL

## 2024-04-23 MED ORDER — FENTANYL CITRATE (PF) 250 MCG/5ML IJ SOLN
INTRAMUSCULAR | Status: DC | PRN
  Administered 2024-04-23: 25 ug via INTRAVENOUS

## 2024-04-23 MED ORDER — FENTANYL CITRATE (PF) 100 MCG/2ML IJ SOLN
INTRAMUSCULAR | Status: AC
  Filled 2024-04-23: qty 2

## 2024-04-23 MED ORDER — SUGAMMADEX SODIUM 200 MG/2ML IV SOLN
INTRAVENOUS | Status: DC | PRN
  Administered 2024-04-23: 200 mg via INTRAVENOUS

## 2024-04-23 MED ORDER — SUCCINYLCHOLINE CHLORIDE 200 MG/10ML IV SOSY
PREFILLED_SYRINGE | INTRAVENOUS | Status: DC | PRN
  Administered 2024-04-23: 100 mg via INTRAVENOUS

## 2024-04-23 MED ORDER — VANCOMYCIN HCL 1000 MG IV SOLR
INTRAVENOUS | Status: DC | PRN
  Administered 2024-04-23: 1000 mg via TOPICAL

## 2024-04-23 MED ORDER — HYDROMORPHONE HCL 1 MG/ML IJ SOLN
0.5000 mg | INTRAMUSCULAR | Status: DC | PRN
  Administered 2024-04-23: 0.5 mg via INTRAVENOUS
  Filled 2024-04-23: qty 1

## 2024-04-23 MED ORDER — VANCOMYCIN HCL 1000 MG IV SOLR
INTRAVENOUS | Status: AC
  Filled 2024-04-23: qty 20

## 2024-04-23 MED ORDER — ONDANSETRON HCL 4 MG/2ML IJ SOLN
INTRAMUSCULAR | Status: DC | PRN
  Administered 2024-04-23: 4 mg via INTRAVENOUS

## 2024-04-23 MED ORDER — ACETAMINOPHEN 500 MG PO TABS
1000.0000 mg | ORAL_TABLET | Freq: Three times a day (TID) | ORAL | Status: AC
  Administered 2024-04-23 – 2024-04-24 (×3): 1000 mg via ORAL
  Filled 2024-04-23 (×3): qty 2

## 2024-04-23 MED ORDER — ROCURONIUM BROMIDE 10 MG/ML (PF) SYRINGE
PREFILLED_SYRINGE | INTRAVENOUS | Status: DC | PRN
  Administered 2024-04-23: 60 mg via INTRAVENOUS

## 2024-04-23 MED ORDER — TRANEXAMIC ACID-NACL 1000-0.7 MG/100ML-% IV SOLN: 1000.0000 mg | Freq: Once | INTRAVENOUS | Status: AC

## 2024-04-23 MED ORDER — DILTIAZEM HCL ER COATED BEADS 240 MG PO CP24
360.0000 mg | ORAL_CAPSULE | Freq: Every day | ORAL | Status: DC
  Administered 2024-04-23 – 2024-04-30 (×8): 360 mg via ORAL
  Filled 2024-04-23 (×11): qty 1

## 2024-04-23 MED ORDER — ACETAMINOPHEN 10 MG/ML IV SOLN
1000.0000 mg | Freq: Once | INTRAVENOUS | Status: DC | PRN
  Administered 2024-04-23: 1000 mg via INTRAVENOUS

## 2024-04-23 MED ORDER — PHENYLEPHRINE 80 MCG/ML (10ML) SYRINGE FOR IV PUSH (FOR BLOOD PRESSURE SUPPORT)
PREFILLED_SYRINGE | INTRAVENOUS | Status: DC | PRN
  Administered 2024-04-23: 80 ug via INTRAVENOUS

## 2024-04-23 MED ORDER — POLYETHYLENE GLYCOL 3350 17 G PO PACK
17.0000 g | PACK | Freq: Every day | ORAL | Status: DC
  Administered 2024-04-23 – 2024-04-30 (×6): 17 g via ORAL
  Filled 2024-04-23 (×7): qty 1

## 2024-04-23 MED ORDER — PROPOFOL 10 MG/ML IV BOLUS
INTRAVENOUS | Status: DC | PRN
  Administered 2024-04-23: 150 mg via INTRAVENOUS

## 2024-04-23 MED ORDER — LABETALOL HCL 5 MG/ML IV SOLN
INTRAVENOUS | Status: AC
  Filled 2024-04-23: qty 4

## 2024-04-23 MED ORDER — IOHEXOL 350 MG/ML SOLN
75.0000 mL | Freq: Once | INTRAVENOUS | Status: AC | PRN
  Administered 2024-04-23: 75 mL via INTRAVENOUS

## 2024-04-23 MED ORDER — LABETALOL HCL 5 MG/ML IV SOLN
10.0000 mg | INTRAVENOUS | Status: DC | PRN
  Administered 2024-04-23: 10 mg via INTRAVENOUS

## 2024-04-23 MED ORDER — FENTANYL CITRATE (PF) 100 MCG/2ML IJ SOLN
25.0000 ug | INTRAMUSCULAR | Status: DC | PRN
  Administered 2024-04-23: 25 ug via INTRAVENOUS

## 2024-04-23 MED ORDER — DEXAMETHASONE SOD PHOSPHATE PF 10 MG/ML IJ SOLN
INTRAMUSCULAR | Status: DC | PRN
  Administered 2024-04-23: 10 mg via INTRAVENOUS

## 2024-04-23 MED ORDER — OXYCODONE HCL 5 MG PO TABS: 5.0000 mg | ORAL_TABLET | Freq: Once | ORAL | Status: DC | PRN

## 2024-04-23 MED ORDER — DROPERIDOL 2.5 MG/ML IJ SOLN: 0.6250 mg | Freq: Once | INTRAMUSCULAR | Status: DC | PRN

## 2024-04-23 MED ORDER — MIDAZOLAM HCL 2 MG/2ML IJ SOLN
INTRAMUSCULAR | Status: AC
  Filled 2024-04-23: qty 2

## 2024-04-23 MED ORDER — SODIUM CHLORIDE 0.9 % IR SOLN
Status: DC | PRN
  Administered 2024-04-23: 3000 mL

## 2024-04-23 MED ORDER — FENTANYL CITRATE (PF) 250 MCG/5ML IJ SOLN
INTRAMUSCULAR | Status: AC
  Filled 2024-04-23: qty 5

## 2024-04-23 MED ORDER — LIDOCAINE 2% (20 MG/ML) 5 ML SYRINGE
INTRAMUSCULAR | Status: DC | PRN
  Administered 2024-04-23: 100 mg via INTRAVENOUS

## 2024-04-23 MED ORDER — TRANEXAMIC ACID-NACL 1000-0.7 MG/100ML-% IV SOLN
INTRAVENOUS | Status: AC
  Filled 2024-04-23: qty 100

## 2024-04-23 MED ORDER — OXYCODONE HCL 5 MG/5ML PO SOLN: 5.0000 mg | Freq: Once | ORAL | Status: DC | PRN

## 2024-04-23 MED ORDER — ONDANSETRON HCL 4 MG/2ML IJ SOLN: 4.0000 mg | Freq: Four times a day (QID) | INTRAMUSCULAR | Status: DC | PRN

## 2024-04-23 MED ORDER — TRANEXAMIC ACID-NACL 1000-0.7 MG/100ML-% IV SOLN
1000.0000 mg | INTRAVENOUS | Status: AC
  Administered 2024-04-23: 1000 mg via INTRAVENOUS
  Filled 2024-04-23: qty 100

## 2024-04-23 MED ORDER — OXYCODONE HCL 5 MG PO TABS
5.0000 mg | ORAL_TABLET | ORAL | Status: DC | PRN
  Administered 2024-04-27 – 2024-04-28 (×2): 10 mg via ORAL
  Administered 2024-04-29: 5 mg via ORAL
  Administered 2024-04-29 – 2024-04-30 (×3): 10 mg via ORAL
  Filled 2024-04-23 (×7): qty 2

## 2024-04-23 MED ORDER — ACETAMINOPHEN 10 MG/ML IV SOLN
INTRAVENOUS | Status: AC
  Filled 2024-04-23: qty 100

## 2024-04-23 MED ORDER — AMOXICILLIN-POT CLAVULANATE 875-125 MG PO TABS
1.0000 | ORAL_TABLET | Freq: Two times a day (BID) | ORAL | 0 refills | Status: DC
  Filled 2024-04-23: qty 14, 7d supply, fill #0

## 2024-04-23 MED ORDER — FLUTICASONE FUROATE-VILANTEROL 200-25 MCG/ACT IN AEPB
1.0000 | INHALATION_SPRAY | Freq: Every day | RESPIRATORY_TRACT | Status: DC
  Administered 2024-04-23 – 2024-04-30 (×8): 1 via RESPIRATORY_TRACT
  Filled 2024-04-23: qty 28

## 2024-04-23 MED ORDER — CEFAZOLIN SODIUM-DEXTROSE 2-4 GM/100ML-% IV SOLN
2.0000 g | Freq: Three times a day (TID) | INTRAVENOUS | Status: AC
  Administered 2024-04-23 (×2): 2 g via INTRAVENOUS
  Filled 2024-04-23 (×2): qty 100

## 2024-04-23 MED ORDER — FUROSEMIDE 40 MG PO TABS: 40.0000 mg | ORAL_TABLET | Freq: Every day | ORAL | Status: DC | PRN

## 2024-04-23 MED ORDER — PANTOPRAZOLE SODIUM 40 MG PO TBEC
40.0000 mg | DELAYED_RELEASE_TABLET | Freq: Every day | ORAL | Status: DC
  Administered 2024-04-23 – 2024-04-30 (×7): 40 mg via ORAL
  Filled 2024-04-23 (×7): qty 1

## 2024-04-23 MED ORDER — ALBUTEROL SULFATE (2.5 MG/3ML) 0.083% IN NEBU
2.5000 mg | INHALATION_SOLUTION | Freq: Three times a day (TID) | RESPIRATORY_TRACT | Status: DC
  Administered 2024-04-23 – 2024-04-24 (×3): 2.5 mg via RESPIRATORY_TRACT
  Filled 2024-04-23 (×4): qty 3

## 2024-04-23 MED ORDER — CYCLOBENZAPRINE HCL 10 MG PO TABS
10.0000 mg | ORAL_TABLET | Freq: Two times a day (BID) | ORAL | Status: DC
  Administered 2024-04-23 – 2024-04-25 (×5): 10 mg via ORAL
  Filled 2024-04-23 (×5): qty 1

## 2024-04-23 MED ORDER — RIVAROXABAN 10 MG PO TABS
10.0000 mg | ORAL_TABLET | Freq: Every day | ORAL | Status: DC
  Administered 2024-04-23 – 2024-04-25 (×2): 10 mg via ORAL
  Filled 2024-04-23 (×2): qty 1

## 2024-04-23 MED ORDER — LORAZEPAM 2 MG/ML IJ SOLN: 1.0000 mg | Freq: Once | INTRAMUSCULAR | Status: AC

## 2024-04-23 MED ORDER — SENNA 8.6 MG PO TABS
1.0000 | ORAL_TABLET | Freq: Two times a day (BID) | ORAL | Status: DC
  Administered 2024-04-23 – 2024-04-30 (×14): 8.6 mg via ORAL
  Filled 2024-04-23 (×13): qty 1

## 2024-04-23 NOTE — Anesthesia Preprocedure Evaluation (Signed)
"                                    Anesthesia Evaluation  Patient identified by MRN, date of birth, ID band Patient awake    Reviewed: Allergy  & Precautions, H&P , NPO status , Patient's Chart, lab work & pertinent test results  History of Anesthesia Complications Negative for: history of anesthetic complications  Airway Mallampati: II  TM Distance: >3 FB Neck ROM: Full    Dental no notable dental hx.    Pulmonary asthma , sleep apnea    Pulmonary exam normal breath sounds clear to auscultation       Cardiovascular hypertension, (-) angina (-) Past MI Normal cardiovascular exam Rhythm:Regular Rate:Normal     Neuro/Psych  Headaches, neg Seizures PSYCHIATRIC DISORDERS  Depression    Pseudotumor cerebri    GI/Hepatic Neg liver ROS,GERD  ,,  Endo/Other  diabetes, Type 2    Renal/GU CRFRenal disease  negative genitourinary   Musculoskeletal  (+) Arthritis ,   right open pillon fracture   Abdominal   Peds negative pediatric ROS (+)  Hematology negative hematology ROS (+)   Anesthesia Other Findings   Reproductive/Obstetrics negative OB ROS                              Anesthesia Physical Anesthesia Plan  ASA: 3  Anesthesia Plan: General   Post-op Pain Management: Ofirmev  IV (intra-op)*   Induction: Intravenous and Rapid sequence  PONV Risk Score and Plan: 3 and Ondansetron , Dexamethasone  and Midazolam   Airway Management Planned: Oral ETT  Additional Equipment: None  Intra-op Plan:   Post-operative Plan: Extubation in OR  Informed Consent: I have reviewed the patients History and Physical, chart, labs and discussed the procedure including the risks, benefits and alternatives for the proposed anesthesia with the patient or authorized representative who has indicated his/her understanding and acceptance.     Dental advisory given  Plan Discussed with: CRNA  Anesthesia Plan Comments:           Anesthesia Quick Evaluation  "

## 2024-04-23 NOTE — Progress Notes (Signed)
 Updated family on plan. NPO at [facility] for repeat surgery tomorrow with Dr. Celena. Answered their questions. Exam unchanged from prior note at 1130am.

## 2024-04-23 NOTE — Transfer of Care (Signed)
 Immediate Anesthesia Transfer of Care Note  Patient: [name]  Procedure(s) Performed: IRRIGATION AND DEBRIDEMENT, OPEN FRACTURE (Right) EXTERNAL FIXATION, LOWER EXTREMITY (Right)  Patient Location: PACU  Anesthesia Type:General  Level of Consciousness: drowsy and patient cooperative  Airway & Oxygen Therapy: Patient Spontanous Breathing and Patient connected to nasal cannula oxygen  Post-op Assessment: Report given to RN, Post -op Vital signs reviewed and stable, and Patient moving all extremities X 4  Post vital signs: Reviewed and stable  Last Vitals:  Vitals Value Taken Time  BP 200/97 04/23/24 06:09  Temp    Pulse 87 04/23/24 06:10  Resp 21 04/23/24 06:10  SpO2 100 % 04/23/24 06:10  Vitals shown include unfiled device data.  Last Pain:  Vitals:   04/23/24 0340  TempSrc: Oral  PainSc:          Complications: No notable events documented.  Dr. Treen at beside and aware of BP

## 2024-04-23 NOTE — Discharge Instructions (Addendum)
 "  Orthopaedic Trauma Service Discharge Instructions   General Discharge Instructions  Orthopaedic Injuries:  Open right distal tibia and fibula fracture treated with serial irrigation and debridements followed by open reduction internal fixation of right tibia and placement of antibiotic spacer  WEIGHT BEARING STATUS: Nonweightbearing right leg  RANGE OF MOTION/ACTIVITY: Okay to move toes and knee on right leg.  Unable to move ankle as you are in a splint.  Do not remove splint  Bone health: Labs show mild vitamin D  insufficiency.  Please take vitamin D3 5000 IUs daily.    Review the following resource for additional information regarding bone health  [email or web address]  Wound Care: Do not remove splint.  Keep splint clean and dry.  Will remove splint at your first postoperative visit.  Please bring black cam boot to your first visit with you  DVT/PE prophylaxis: Eliquis  2.5 mg every 12 hours for 30 days for blood clot prevention  Diet: as you were eating previously.  Can use over the counter stool softeners and bowel preparations, such as Miralax , to help with bowel movements.  Narcotics can be constipating.  Be sure to drink plenty of fluids  PAIN MEDICATION USE AND EXPECTATIONS  You have likely been given narcotic medications to help control your pain.  After a traumatic event that results in an fracture (broken bone) with or without surgery, it is ok to use narcotic pain medications to help control one's pain.  We understand that everyone responds to pain differently and each individual patient will be evaluated on a regular basis for the continued need for narcotic medications. Ideally, narcotic medication use should last no more than 6-8 weeks (coinciding with fracture healing).   As a patient it is your responsibility as well to monitor narcotic medication use and report the amount and frequency you use these medications when you come to your office visit.    We would also advise that if you are using narcotic medications, you should take a dose prior to therapy to maximize you participation.  IF YOU ARE ON NARCOTIC MEDICATIONS IT IS NOT PERMISSIBLE TO OPERATE A MOTOR VEHICLE (MOTORCYCLE/CAR/TRUCK/MOPED) OR HEAVY MACHINERY DO NOT MIX NARCOTICS WITH OTHER CNS (CENTRAL NERVOUS SYSTEM) DEPRESSANTS SUCH AS ALCOHOL   POST-OPERATIVE OPIOID TAPER INSTRUCTIONS: It is important to wean off of your opioid medication as soon as possible. If you do not need pain medication after your surgery it is ok to stop day one. Opioids include: Codeine , Hydrocodone (Norco, Vicodin), Oxycodone (Percocet, oxycontin ) and hydromorphone  amongst others.  Long term and even short term use of opiods can cause: Increased pain response Dependence Constipation Depression Respiratory depression And more.  Withdrawal symptoms can include Flu like symptoms Nausea, vomiting And more Techniques to manage these symptoms Hydrate well Eat regular healthy meals Stay active Use relaxation techniques(deep breathing, meditating, yoga) Do Not substitute Alcohol to help with tapering If you have been on opioids for less than two weeks and do not have pain than it is ok to stop all together.  Plan to wean off of opioids This plan should start within one week post op of your fracture surgery  Maintain the same interval or time between taking each dose and first decrease the dose.  Cut the total daily intake of opioids by one tablet each day Next start to increase the time between doses. The last dose that should be eliminated is the evening dose.    STOP SMOKING OR USING NICOTINE PRODUCTS!!!!  As discussed nicotine severely impairs your  body's ability to heal surgical and traumatic wounds but also impairs bone healing.  Wounds and bone heal by forming microscopic blood vessels (angiogenesis) and nicotine is a vasoconstrictor (essentially, shrinks blood vessels).  Therefore, if  vasoconstriction occurs to these microscopic blood vessels they essentially disappear and are unable to deliver necessary nutrients to the healing tissue.  This is one modifiable factor that you can do to dramatically increase your chances of healing your injury.    (This means no smoking, no nicotine gum, patches, etc)  DO NOT USE NONSTEROIDAL ANTI-INFLAMMATORY DRUGS (NSAID'S)  Using products such as Advil  (ibuprofen ), Aleve (naproxen), Motrin  (ibuprofen ) for additional pain control during fracture healing can delay and/or prevent the healing response.  If you would like to take over the counter (OTC) medication, Tylenol  (acetaminophen ) is ok.  However, some narcotic medications that are given for pain control contain acetaminophen  as well. Therefore, you should not exceed more than 4000 mg of tylenol  in a day if you do not have liver disease.  Also note that there are may OTC medicines, such as cold medicines and allergy  medicines that my contain tylenol  as well.  If you have any questions about medications and/or interactions please ask your doctor/PA or your pharmacist.      ICE AND ELEVATE INJURED/OPERATIVE EXTREMITY  Using ice and elevating the injured extremity above your heart can help with swelling and pain control.  Icing in a pulsatile fashion, such as 20 minutes on and 20 minutes off, can be followed.    Do not place ice directly on skin. Make sure there is a barrier between to skin and the ice pack.    Using frozen items such as frozen peas works well as the conform nicely to the are that needs to be iced.  USE AN ACE WRAP OR TED HOSE FOR SWELLING CONTROL  In addition to icing and elevation, Ace wraps or TED hose are used to help limit and resolve swelling.  It is recommended to use Ace wraps or TED hose until you are informed to stop.    When using Ace Wraps start the wrapping distally (farthest away from the body) and wrap proximally (closer to the body)   Example: If you had surgery on  your leg and you do not have a splint on, start the ace wrap at the toes and work your way up to the thigh        If you had surgery on your upper extremity and do not have a splint on, start the ace wrap at your fingers and work your way up to the upper arm  IF YOU ARE IN A SPLINT OR CAST DO NOT REMOVE IT FOR ANY REASON   If your splint gets wet for any reason please contact the office immediately. You may shower in your splint or cast as long as you keep it dry.  This can be done by wrapping in a cast cover or garbage back (or similar)  Do Not stick any thing down your splint or cast such as pencils, money, or hangers to try and scratch yourself with.  If you feel itchy take benadryl as prescribed on the bottle for itching  IF YOU ARE IN A CAM BOOT (BLACK BOOT)  You may remove boot periodically. Perform daily dressing changes as noted below.  Wash the liner of the boot regularly and wear a sock when wearing the boot. It is recommended that you sleep in the boot until told otherwise  Call office for the following: Temperature greater than 101F Persistent nausea and vomiting Severe uncontrolled pain Redness, tenderness, or signs of infection (pain, swelling, redness, odor or green/yellow discharge around the site) Difficulty breathing, headache or visual disturbances Hives Persistent dizziness or light-headedness Extreme fatigue Any other questions or concerns you may have after discharge  In an emergency, call 911 or go to an Emergency Department at a nearby hospital  HELPFUL INFORMATION  If you had a block, it will wear off between 8-24 hrs postop typically.  This is period when your pain may go from nearly zero to the pain you would have had postop without the block.  This is an abrupt transition but nothing dangerous is happening.  You may take an extra dose of narcotic when this happens.  You should wean off your narcotic medicines as soon as you are able.  Most patients will be  off or using minimal narcotics before their first postop appointment.   We suggest you use the pain medication the first night prior to going to bed, in order to ease any pain when the anesthesia wears off. You should avoid taking pain medications on an empty stomach as it will make you nauseous.  Do not drink alcoholic beverages or take illicit drugs when taking pain medications.  In most states it is against the law to drive while you are in a splint or sling.  And certainly against the law to drive while taking narcotics.  You may return to work/school in the next couple of days when you feel up to it.   Pain medication may make you constipated.  Below are a few solutions to try in this order: Decrease the amount of pain medication if you arent having pain. Drink lots of decaffeinated fluids. Drink prune juice and/or each dried prunes  If the first 3 dont work start with additional solutions Take Colace - an over-the-counter stool softener Take Senokot - an over-the-counter laxative Take Miralax  - a stronger over-the-counter laxative     CALL THE OFFICE WITH ANY QUESTIONS OR CONCERNS: [phone]   VISIT OUR WEBSITE FOR ADDITIONAL INFORMATION: [email or web address]    =================================================  Information on my medicine - ELIQUIS  (apixaban )  This medication education was reviewed with me or my healthcare representative as part of my discharge preparation.    Why was Eliquis  prescribed for you? Eliquis  was prescribed for you to reduce the risk of blood clots forming after orthopedic surgery.    What do You need to know about Eliquis ? Take your Eliquis  TWICE DAILY - one tablet in the morning and one tablet in the evening with or without food.  It would be best to take the dose about the same time each day.  If you have difficulty swallowing the tablet whole please discuss with your pharmacist how to take the medication safely.  Take Eliquis   exactly as prescribed by your doctor and DO NOT stop taking Eliquis  without talking to the doctor who prescribed the medication.  Stopping without other medication to take the place of Eliquis  may increase your risk of developing a clot.  After discharge, you should have regular check-up appointments with your healthcare provider that is prescribing your Eliquis .  What do you do if you miss a dose? If a dose of ELIQUIS  is not taken at the scheduled time, take it as soon as possible on the same day and twice-daily administration should be resumed.  The dose should not be doubled to make up for a  missed dose.  Do not take more than one tablet of ELIQUIS  at the same time.  Important Safety Information A possible side effect of Eliquis  is bleeding. You should call your healthcare provider right away if you experience any of the following: Bleeding from an injury or your nose that does not stop. Unusual colored urine (red or dark brown) or unusual colored stools (red or black). Unusual bruising for unknown reasons. A serious fall or if you hit your head (even if there is no bleeding).  Some medicines may interact with Eliquis  and might increase your risk of bleeding or clotting while on Eliquis . To help avoid this, consult your healthcare provider or pharmacist prior to using any new prescription or non-prescription medications, including herbals, vitamins, non-steroidal anti-inflammatory drugs (NSAIDs) and supplements.  This website has more information on Eliquis  (apixaban ): [email or web address] [email or web address] dena   "

## 2024-04-23 NOTE — Plan of Care (Signed)
" °  Problem: Cardiac: Goal: Ability to maintain an adequate cardiac output Outcome: Progressing   "

## 2024-04-23 NOTE — Progress Notes (Signed)
 Orthopedic Surgery Progress Note   Assessment: Patient is a 59 y.o. female with right open pilon fracture status post I&D and application of external fixation   Plan: -Will need definitive fixation at a later date. Plan to discuss with my traumatology colleagues -Diet: regular -DVT ppx: Xarelto (ingredients in lovenox  and heparin  cause severe allergy ) -Antibiotics: ancef  x2 post-op doses -Weight bearing status: NWB RLE in ex-fix -PT evaluate and treat -Pain control -Dispo: pending completion of operative plans  ___________________________________________________________________________  Subjective: No acute events since surgery. Has made it to the floor. Pain well controlled.    Physical Exam:  General: no acute distress, appears stated age Neurologic: alert, answering questions appropriately, following commands Respiratory: unlabored breathing on room air, symmetric chest rise  MSK:    -Right lower extremity  External fixator in place, no bleeding seen around the pin sites  Wound vac in place with good seal and no evidence of leak - no drainage in the canister Plantarflexes and dorsiflexes toes Sensation intact to light touch in sural, saphenous, tibial, deep peroneal, and superficial peroneal nerve distributions Foot warm and well perfused   Yesterday's total administered Morphine Milligram Equivalents: 12   Patient name: [name] Patient MRN: [id number] Date: 04/23/2024
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
## 2024-04-23 NOTE — Brief Op Note (Signed)
 04/23/2024  6:11 AM  PATIENT:  [name]  59 y.o. female  PRE-OPERATIVE DIAGNOSIS:  right open pilon fracture  POST-OPERATIVE DIAGNOSIS:  right open pilon fracture  PROCEDURE:  Procedures with comments: IRRIGATION AND DEBRIDEMENT, OPEN FRACTURE (Right) - I & D RIGHT OPEN PILLON FRACTURE EXTERNAL FIXATION, LOWER EXTREMITY (Right)  SURGEON:  Surgeons and Role:    * Kerr Ozell LABOR, MD - Primary  PHYSICIAN ASSISTANT: none  ASSISTANTS: none   ANESTHESIA:   general  EBL:  30mL  BLOOD ADMINISTERED:none  DRAINS: none   LOCAL MEDICATIONS USED:  NONE  SPECIMEN:  No Specimen  DISPOSITION OF SPECIMEN:  N/A  COUNTS:  YES  TOURNIQUET:  NONE  DICTATION: .Note written in EPIC  PLAN OF CARE: Admit to inpatient   PATIENT DISPOSITION:  PACU - hemodynamically stable.   Delay start of Pharmacological VTE agent (>24hrs) due to surgical blood loss or risk of bleeding: no

## 2024-04-23 NOTE — ED Notes (Signed)
 Dr. Georgina at bedside.

## 2024-04-23 NOTE — Progress Notes (Signed)
 Orthopedic Tech Progress Note Patient Details:  [name] 1966/02/04 [id number] Ready for OR.  Ortho Devices Type of Ortho Device: Ace wrap, Cotton web roll, Stirrup splint, Post (short leg) splint Ortho Device/Splint Location: R TIB/FIB Ortho Device/Splint Interventions: Ordered, Application   Post Interventions Patient Tolerated: Fair Instructions Provided: Care of device  Daliya Parchment L Von Inscoe 04/23/2024, 3:44 AM
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
## 2024-04-23 NOTE — Anesthesia Procedure Notes (Signed)
 Procedure Name: Intubation Date/Time: 04/23/2024 4:42 AM  Performed by: Lamar Lucie DASEN, CRNAPre-anesthesia Checklist: Patient identified, Emergency Drugs available, Suction available and Patient being monitored Patient Re-evaluated:Patient Re-evaluated prior to induction Oxygen Delivery Method: Circle system utilized Preoxygenation: Pre-oxygenation with 100% oxygen Induction Type: IV induction, Rapid sequence and Cricoid Pressure applied Laryngoscope Size: Mac and 3 Grade View: Grade I Tube type: Oral Tube size: 7.0 mm Number of attempts: 1 Airway Equipment and Method: Stylet and Oral airway Placement Confirmation: ETT inserted through vocal cords under direct vision, positive ETCO2 and breath sounds checked- equal and bilateral Secured at: 22 cm Tube secured with: Tape Dental Injury: Teeth and Oropharynx as per pre-operative assessment

## 2024-04-24 ENCOUNTER — Other Ambulatory Visit (HOSPITAL_COMMUNITY): Payer: Self-pay

## 2024-04-24 ENCOUNTER — Encounter (HOSPITAL_COMMUNITY): Payer: Self-pay | Admitting: Orthopedic Surgery

## 2024-04-24 ENCOUNTER — Inpatient Hospital Stay (HOSPITAL_COMMUNITY): Admitting: Certified Registered"

## 2024-04-24 ENCOUNTER — Encounter (HOSPITAL_COMMUNITY)
Admission: EM | Disposition: A | Discharge status: Home / Self Care | Payer: Self-pay | Source: Home / Self Care | Attending: Orthopedic Surgery

## 2024-04-24 ENCOUNTER — Inpatient Hospital Stay (HOSPITAL_COMMUNITY)

## 2024-04-24 ENCOUNTER — Other Ambulatory Visit: Payer: Self-pay

## 2024-04-24 DIAGNOSIS — S82871A Displaced pilon fracture of right tibia, initial encounter for closed fracture: Secondary | ICD-10-CM | POA: Diagnosis not present

## 2024-04-24 DIAGNOSIS — I10 Essential (primary) hypertension: Secondary | ICD-10-CM | POA: Diagnosis not present

## 2024-04-24 DIAGNOSIS — J449 Chronic obstructive pulmonary disease, unspecified: Secondary | ICD-10-CM

## 2024-04-24 DIAGNOSIS — G4733 Obstructive sleep apnea (adult) (pediatric): Secondary | ICD-10-CM

## 2024-04-24 HISTORY — PX: INCISION AND DRAINAGE OF WOUND: SHX1803

## 2024-04-24 HISTORY — PX: EXTERNAL FIXATION, ANKLE: SHX7570

## 2024-04-24 LAB — BASIC METABOLIC PANEL WITH GFR
Anion gap: 11 (ref 5–15)
BUN: 19 mg/dL (ref 6–20)
CO2: 25 mmol/L (ref 22–32)
Calcium: 9.1 mg/dL (ref 8.9–10.3)
Chloride: 105 mmol/L (ref 98–111)
Creatinine, Ser: 1.07 mg/dL — ABNORMAL HIGH (ref 0.44–1.00)
GFR, Estimated: >60 mL/min
Glucose, Bld: 118 mg/dL — ABNORMAL HIGH (ref 70–99)
Potassium: 3.9 mmol/L (ref 3.5–5.1)
Sodium: 141 mmol/L (ref 135–145)

## 2024-04-24 LAB — CBC
HCT: 31.9 % — ABNORMAL LOW (ref 36.0–46.0)
Hemoglobin: 10.1 g/dL — ABNORMAL LOW (ref 12.0–15.0)
MCH: 26 pg (ref 26.0–34.0)
MCHC: 31.7 g/dL (ref 30.0–36.0)
MCV: 82.2 fL (ref 80.0–100.0)
Platelets: 304 K/uL (ref 150–400)
RBC: 3.88 MIL/uL (ref 3.87–5.11)
RDW: 14.8 % (ref 11.5–15.5)
WBC: 10.4 K/uL (ref 4.0–10.5)
nRBC: 0.2 % (ref 0.0–0.2)

## 2024-04-24 LAB — SURGICAL PCR SCREEN
MRSA, PCR: NEGATIVE
Staphylococcus aureus: POSITIVE — AB

## 2024-04-24 MED ORDER — HYDROMORPHONE HCL 1 MG/ML IJ SOLN
0.2500 mg | INTRAMUSCULAR | Status: DC | PRN
  Administered 2024-04-24: 0.25 mg via INTRAVENOUS

## 2024-04-24 MED ORDER — PHENYLEPHRINE HCL-NACL 20-0.9 MG/250ML-% IV SOLN
INTRAVENOUS | Status: DC | PRN
  Administered 2024-04-24: 30 ug/min via INTRAVENOUS
  Administered 2024-04-24: 120 ug via INTRAVENOUS

## 2024-04-24 MED ORDER — TOBRAMYCIN SULFATE 1.2 G IJ SOLR
INTRAMUSCULAR | Status: AC
  Filled 2024-04-24: qty 1.2

## 2024-04-24 MED ORDER — MIDAZOLAM HCL (PF) 2 MG/2ML IJ SOLN: 0.5000 mg | Freq: Once | INTRAMUSCULAR | Status: DC | PRN

## 2024-04-24 MED ORDER — OXYCODONE HCL 5 MG/5ML PO SOLN: 5.0000 mg | Freq: Once | ORAL | Status: DC | PRN

## 2024-04-24 MED ORDER — MEPERIDINE HCL 25 MG/ML IJ SOLN: 6.2500 mg | INTRAMUSCULAR | Status: DC | PRN

## 2024-04-24 MED ORDER — CHLORHEXIDINE GLUCONATE CLOTH 2 % EX PADS
6.0000 | MEDICATED_PAD | Freq: Every day | CUTANEOUS | Status: AC
  Administered 2024-04-25 – 2024-04-29 (×5): 6 via TOPICAL

## 2024-04-24 MED ORDER — FENTANYL CITRATE (PF) 100 MCG/2ML IJ SOLN
INTRAMUSCULAR | Status: AC
  Filled 2024-04-24: qty 2

## 2024-04-24 MED ORDER — HYDROMORPHONE HCL 1 MG/ML IJ SOLN
INTRAMUSCULAR | Status: DC | PRN
  Administered 2024-04-24: .5 mg via INTRAVENOUS

## 2024-04-24 MED ORDER — OXYCODONE HCL 5 MG PO TABS: 5.0000 mg | ORAL_TABLET | Freq: Once | ORAL | Status: DC | PRN

## 2024-04-24 MED ORDER — PROPOFOL 10 MG/ML IV BOLUS
INTRAVENOUS | Status: DC | PRN
  Administered 2024-04-24: 200 mg via INTRAVENOUS
  Administered 2024-04-24: 50 ug/kg/min via INTRAVENOUS

## 2024-04-24 MED ORDER — ALBUTEROL SULFATE (2.5 MG/3ML) 0.083% IN NEBU
2.5000 mg | INHALATION_SOLUTION | Freq: Two times a day (BID) | RESPIRATORY_TRACT | Status: DC
  Administered 2024-04-25 – 2024-04-29 (×8): 2.5 mg via RESPIRATORY_TRACT
  Filled 2024-04-24 (×12): qty 3

## 2024-04-24 MED ORDER — MIDAZOLAM HCL 2 MG/2ML IJ SOLN
INTRAMUSCULAR | Status: AC
  Filled 2024-04-24: qty 2

## 2024-04-24 MED ORDER — VANCOMYCIN HCL 1000 MG IV SOLR
INTRAVENOUS | Status: AC
  Filled 2024-04-24: qty 20

## 2024-04-24 MED ORDER — ORAL CARE MOUTH RINSE: 15.0000 mL | Freq: Once | OROMUCOSAL | Status: AC

## 2024-04-24 MED ORDER — 0.9 % SODIUM CHLORIDE (POUR BTL) OPTIME
TOPICAL | Status: DC | PRN
  Administered 2024-04-24: 1000 mL

## 2024-04-24 MED ORDER — CHLORHEXIDINE GLUCONATE 0.12 % MT SOLN
OROMUCOSAL | Status: AC
  Administered 2024-04-24: 15 mL via OROMUCOSAL
  Filled 2024-04-24: qty 15

## 2024-04-24 MED ORDER — MIDAZOLAM HCL (PF) 2 MG/2ML IJ SOLN
INTRAMUSCULAR | Status: DC | PRN
  Administered 2024-04-24: 2 mg via INTRAVENOUS

## 2024-04-24 MED ORDER — SODIUM CHLORIDE 0.9 % IV SOLN
2.0000 g | INTRAVENOUS | Status: AC
  Administered 2024-04-24 – 2024-04-26 (×3): 2 g via INTRAVENOUS
  Filled 2024-04-24 (×3): qty 20

## 2024-04-24 MED ORDER — CHLORHEXIDINE GLUCONATE 0.12 % MT SOLN: 15.0000 mL | Freq: Once | OROMUCOSAL | Status: AC

## 2024-04-24 MED ORDER — VANCOMYCIN HCL 1000 MG IV SOLR
INTRAVENOUS | Status: DC | PRN
  Administered 2024-04-24: 1000 mg

## 2024-04-24 MED ORDER — LIDOCAINE 2% (20 MG/ML) 5 ML SYRINGE
INTRAMUSCULAR | Status: DC | PRN
  Administered 2024-04-24: 100 mg via INTRAVENOUS

## 2024-04-24 MED ORDER — CEFAZOLIN SODIUM-DEXTROSE 2-3 GM-%(50ML) IV SOLR
INTRAVENOUS | Status: DC | PRN
  Administered 2024-04-24: 2 g via INTRAVENOUS

## 2024-04-24 MED ORDER — DEXAMETHASONE SOD PHOSPHATE PF 10 MG/ML IJ SOLN
INTRAMUSCULAR | Status: DC | PRN
  Administered 2024-04-24: 5 mg via INTRAVENOUS

## 2024-04-24 MED ORDER — SODIUM CHLORIDE 0.9 % IR SOLN
Status: DC | PRN
  Administered 2024-04-24: 3000 mL

## 2024-04-24 MED ORDER — FENTANYL CITRATE (PF) 100 MCG/2ML IJ SOLN
50.0000 ug | Freq: Once | INTRAMUSCULAR | Status: AC
  Administered 2024-04-24: 50 ug via INTRAVENOUS

## 2024-04-24 MED ORDER — HYDROMORPHONE HCL 1 MG/ML IJ SOLN
INTRAMUSCULAR | Status: AC
  Filled 2024-04-24: qty 1

## 2024-04-24 MED ORDER — TOBRAMYCIN SULFATE 1.2 G IJ SOLR
INTRAMUSCULAR | Status: DC | PRN
  Administered 2024-04-24: 1.2 g

## 2024-04-24 MED ORDER — FENTANYL CITRATE (PF) 250 MCG/5ML IJ SOLN
INTRAMUSCULAR | Status: DC | PRN
  Administered 2024-04-24: 100 ug via INTRAVENOUS

## 2024-04-24 MED ORDER — MUPIROCIN 2 % EX OINT
1.0000 | TOPICAL_OINTMENT | Freq: Two times a day (BID) | CUTANEOUS | Status: AC
  Administered 2024-04-25 – 2024-04-29 (×7): 1 via NASAL
  Filled 2024-04-24: qty 22

## 2024-04-24 MED ORDER — HYDROMORPHONE HCL 1 MG/ML IJ SOLN
INTRAMUSCULAR | Status: AC
  Filled 2024-04-24: qty 0.5

## 2024-04-24 MED ORDER — LACTATED RINGERS IV SOLN: INTRAVENOUS | Status: DC

## 2024-04-24 MED ORDER — ONDANSETRON HCL 4 MG/2ML IJ SOLN
INTRAMUSCULAR | Status: DC | PRN
  Administered 2024-04-24: 4 mg via INTRAVENOUS

## 2024-04-24 NOTE — Anesthesia Procedure Notes (Signed)
 Procedure Name: LMA Insertion Date/Time: 04/24/2024 4:34 PM  Performed by: Delores Duwaine SAUNDERS, CRNAPre-anesthesia Checklist: Patient identified, Emergency Drugs available, Suction available and Patient being monitored Patient Re-evaluated:Patient Re-evaluated prior to induction Oxygen Delivery Method: Circle System Utilized Preoxygenation: Pre-oxygenation with 100% oxygen Induction Type: IV induction Ventilation: Mask ventilation without difficulty LMA: LMA inserted LMA Size: 4.0 Number of attempts: 1 Airway Equipment and Method: Bite block Placement Confirmation: positive ETCO2 Tube secured with: Tape Dental Injury: Teeth and Oropharynx as per pre-operative assessment
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
## 2024-04-24 NOTE — Progress Notes (Signed)
"  Patient off floor to OR  "

## 2024-04-24 NOTE — Progress Notes (Signed)
 Orthopedic Surgery Progress Note   Assessment: Patient is a 59 y.o. female with right open pilon fracture status post I&D and application of external fixation   Plan: -Planning for repeat surgery with Dr. Celena today -Diet: NPO for procedure -DVT ppx: Xarelto (ingredients in lovenox  and heparin  cause severe allergy ) -Antibiotics: ancef  x2 post-op doses -Weight bearing status: NWB RLE in ex-fix -PT evaluate and treat -Pain control -Dispo: pending completion of operative plans  ___________________________________________________________________________  Subjective: No acute events overnight. Pain reasonably well controlled with current medications.    Physical Exam:  General: no acute distress, appears stated age Neurologic: alert, answering questions appropriately, following commands Respiratory: unlabored breathing on room air, symmetric chest rise  MSK:    -Right lower extremity  External fixator in place, no bleeding seen around the pin sites  Wound vac in place with good seal and no evidence of leak - no drainage in the canister Plantarflexes and dorsiflexes toes Sensation intact to light touch in sural, saphenous, tibial, deep peroneal, and superficial peroneal nerve distributions Foot warm and well perfused   Yesterday's total administered Morphine Milligram Equivalents: 35   Patient name: [name] Patient MRN: [id number] Date: 04/24/2024
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
## 2024-04-24 NOTE — Transfer of Care (Signed)
 Immediate Anesthesia Transfer of Care Note  Patient: [name]  Procedure(s) Performed: Adjustment of External Fixation, Antibiotic Spacer (Right) DEBRIDEMENT OF OPEN WOUND INCLUDING BONE (Right)  Patient Location: PACU  Anesthesia Type:General  Level of Consciousness: drowsy  Airway & Oxygen Therapy: Patient Spontanous Breathing  Post-op Assessment: Report given to RN  Post vital signs: Reviewed and stable  Last Vitals:  Vitals Value Taken Time  BP 155/79 04/24/24 18:02  Temp 36.8 C 04/24/24 18:02  Pulse 61 04/24/24 18:14  Resp 15 04/24/24 18:14  SpO2 100 % 04/24/24 18:14  Vitals shown include unfiled device data.  Last Pain:  Vitals:   04/24/24 1802  TempSrc:   PainSc: Asleep         Complications: No notable events documented.

## 2024-04-24 NOTE — Progress Notes (Signed)
 PT Cancellation Note  Patient Details Name: [name] MRN: [id number] DOB: 1966/03/26   Cancelled Treatment:    Reason Eval/Treat Not Completed: Patient at procedure or test/unavailable (pt is in the OR.  PT to assess for new orders tomorrow 04/25/24)  Asberry HERO, PT, DPT  Acute Rehabilitation Secure chat is best for contact #[phone] office    Asberry KATHEE Friendly 04/24/2024, 2:23 PM

## 2024-04-24 NOTE — Plan of Care (Signed)

## 2024-04-25 MED ORDER — CYCLOBENZAPRINE HCL 10 MG PO TABS
10.0000 mg | ORAL_TABLET | Freq: Three times a day (TID) | ORAL | Status: DC
  Administered 2024-04-25 – 2024-04-30 (×14): 10 mg via ORAL
  Filled 2024-04-25 (×14): qty 1

## 2024-04-25 MED ORDER — ACETAMINOPHEN 325 MG PO TABS
650.0000 mg | ORAL_TABLET | Freq: Four times a day (QID) | ORAL | Status: DC
  Administered 2024-04-25 – 2024-04-30 (×19): 650 mg via ORAL
  Filled 2024-04-25 (×20): qty 2
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
## 2024-04-25 NOTE — Progress Notes (Signed)
 Orthopedic Progress Note  Saw patient this morning. She is now status post repeat debridement and Ex-Fix adjustment with placement of cement spacer with Dr. Celena yesterday.  Pain well-controlled.  EHL/TA/GSC intact.  Sensation intact to light touch in sural/saphenous/deep peroneal/superficial peroneal/tibial nerve distributions.  Foot warm and well-perfused.  External fixator in place with no drainage around the pin sites.  From talking to the patient, it sounds like the plan is to go back on Tuesday for repeat surgery and likely removal of the external fixator.   Gina DELENA [name], MD Orthopedic Surgeon

## 2024-04-25 NOTE — Plan of Care (Signed)
" °  Problem: Education: Goal: Knowledge of the prescribed therapeutic regimen will improve Outcome: Progressing   Problem: Nutritional: Goal: Will attain and maintain optimal nutritional status Outcome: Progressing   Problem: Clinical Measurements: Goal: Ability to maintain clinical measurements within normal limits Outcome: Progressing   Problem: Urinary Elimination: Goal: Will remain free from infection Outcome: Progressing   Problem: Activity: Goal: Risk for activity intolerance will decrease Outcome: Progressing   Problem: Pain Managment: Goal: General experience of comfort will improve and/or be controlled Outcome: Progressing   Problem: Safety: Goal: Ability to remain free from injury will improve Outcome: Progressing   Problem: Skin Integrity: Goal: Risk for impaired skin integrity will decrease Outcome: Progressing   "
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
## 2024-04-26 ENCOUNTER — Other Ambulatory Visit (HOSPITAL_COMMUNITY): Payer: Self-pay

## 2024-04-26 LAB — CBC
HCT: 29.9 % — ABNORMAL LOW (ref 36.0–46.0)
Hemoglobin: 9.4 g/dL — ABNORMAL LOW (ref 12.0–15.0)
MCH: 25.8 pg — ABNORMAL LOW (ref 26.0–34.0)
MCHC: 31.4 g/dL (ref 30.0–36.0)
MCV: 81.9 fL (ref 80.0–100.0)
Platelets: 303 K/uL (ref 150–400)
RBC: 3.65 MIL/uL — ABNORMAL LOW (ref 3.87–5.11)
RDW: 14.7 % (ref 11.5–15.5)
WBC: 10.6 K/uL — ABNORMAL HIGH (ref 4.0–10.5)
nRBC: 0 % (ref 0.0–0.2)

## 2024-04-26 LAB — HEMOGLOBIN A1C
Hgb A1c MFr Bld: 6 % — ABNORMAL HIGH (ref 4.8–5.6)
Mean Plasma Glucose: 125.5 mg/dL

## 2024-04-26 LAB — VITAMIN D 25 HYDROXY (VIT D DEFICIENCY, FRACTURES): Vit D, 25-Hydroxy: 18.2 ng/mL — ABNORMAL LOW (ref 30–100)

## 2024-04-26 NOTE — Plan of Care (Signed)
" °  Problem: Skin Integrity: Goal: Demonstrates signs of wound healing without infection Outcome: Progressing   Problem: Activity: Goal: Risk for activity intolerance will decrease Outcome: Progressing   "

## 2024-04-26 NOTE — Anesthesia Postprocedure Evaluation (Signed)
"   Anesthesia Post Note  Patient: [name]  Procedure(s) Performed: IRRIGATION AND DEBRIDEMENT, OPEN FRACTURE (Right) EXTERNAL FIXATION, LOWER EXTREMITY (Right)     Patient location during evaluation: PACU Anesthesia Type: General Level of consciousness: awake and alert Pain management: pain level controlled Vital Signs Assessment: post-procedure vital signs reviewed and stable Respiratory status: spontaneous breathing, nonlabored ventilation, respiratory function stable and patient connected to nasal cannula oxygen Cardiovascular status: blood pressure returned to baseline and stable Postop Assessment: no apparent nausea or vomiting Anesthetic complications: no   No notable events documented.  Last Vitals:  Vitals:   04/26/24 1358 04/26/24 1906  BP: 139/68   Pulse: 64   Resp: 18   Temp: 36.9 C   SpO2: 99% 99%    Last Pain:  Vitals:   04/26/24 1645  TempSrc:   PainSc: 4                  Kysen Wetherington R Jaymason Ledesma      "

## 2024-04-26 NOTE — Progress Notes (Signed)
 Transition of Care [facility]) - CAGE-AID Screening   Patient Details  Name: ZINEB GLADE MRN: [id number] Date of Birth: [birth year]   Darice CHRISTELLA Rouleau, RN Trauma Response Nurse Phone Number: [phone] 04/26/2024, 12:06 PM   CAGE-AID Screening:    Have You Ever Felt You Ought to Cut Down on Your Drinking or Drug Use?: No Have People Annoyed You By [employer] Your Drinking Or Drug Use?: No Have You Felt Bad Or Guilty About Your Drinking Or Drug Use?: No Have You Ever Had a Drink or Used Drugs First Thing In The Morning to Steady Your Nerves or to Get Rid of a Hangover?: No CAGE-AID Score: 0  Substance Abuse Education Offered: (S) No

## 2024-04-26 NOTE — Plan of Care (Signed)
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
## 2024-04-27 ENCOUNTER — Encounter (HOSPITAL_COMMUNITY): Payer: Self-pay | Admitting: Orthopedic Surgery

## 2024-04-27 MED ORDER — CEFAZOLIN SODIUM-DEXTROSE 2-4 GM/100ML-% IV SOLN
2.0000 g | INTRAVENOUS | Status: AC
  Administered 2024-04-28: 2 g via INTRAVENOUS
  Filled 2024-04-27: qty 100
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
## 2024-04-27 NOTE — Plan of Care (Signed)
" °  Problem: Education: Goal: Knowledge of the prescribed therapeutic regimen will improve Outcome: Progressing   Problem: Respiratory: Goal: Will regain and/or maintain adequate ventilation Outcome: Progressing   Problem: Skin Integrity: Goal: Demonstrates signs of wound healing without infection Outcome: Progressing   Problem: Education: Goal: Knowledge of General Education information will improve Description: Including pain rating scale, medication(s)/side effects and non-pharmacologic comfort measures Outcome: Progressing   Problem: Activity: Goal: Risk for activity intolerance will decrease Outcome: Progressing   Problem: Safety: Goal: Ability to remain free from injury will improve Outcome: Progressing   Problem: Skin Integrity: Goal: Risk for impaired skin integrity will decrease Outcome: Progressing   "
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
## 2024-04-27 NOTE — Plan of Care (Signed)
" °  Problem: Skin Integrity: Goal: Demonstrates signs of wound healing without infection Outcome: Progressing   Problem: Activity: Goal: Risk for activity intolerance will decrease Outcome: Progressing   Problem: Pain Managment: Goal: General experience of comfort will improve and/or be controlled Outcome: Progressing   "
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
## 2024-04-27 NOTE — Anesthesia Postprocedure Evaluation (Signed)
"   Anesthesia Post Note  Patient: [name]  Procedure(s) Performed: Adjustment of External Fixation, Antibiotic Spacer (Right) DEBRIDEMENT OF OPEN WOUND INCLUDING BONE (Right)     Patient location during evaluation: PACU Anesthesia Type: General Level of consciousness: awake and alert Pain management: pain level controlled Vital Signs Assessment: post-procedure vital signs reviewed and stable Respiratory status: spontaneous breathing, nonlabored ventilation, respiratory function stable and patient connected to nasal cannula oxygen Cardiovascular status: blood pressure returned to baseline and stable Postop Assessment: no apparent nausea or vomiting Anesthetic complications: no   No notable events documented.              [name]      "

## 2024-04-27 NOTE — Progress Notes (Signed)
 Mobility Specialist Progress Note:    04/27/24 1614  Mobility  Activity Pivoted/transferred from chair to bed  Level of Assistance Contact guard assist, steadying assist  Assistive Device Front wheel walker  Distance Ambulated (ft) 8 ft  RLE Weight Bearing Per Provider Order NWB  Activity Response Tolerated well  Mobility Referral Yes  Mobility visit 1 Mobility  Mobility Specialist Start Time (ACUTE ONLY) 1603  Mobility Specialist Stop Time (ACUTE ONLY) 1613  Mobility Specialist Time Calculation (min) (ACUTE ONLY) 10 min   Received pt in chair and agreeable to mobility. Pt required MinG fro safety. Pt c/o RLE discomfort, otherwise tolerated well. Pt left in bed with personal belongings and call light within reach. All needs met.  Lavanda Pollack Mobility Specialist  Please contact via [employer] or  Rehab Office [phone]

## 2024-04-28 ENCOUNTER — Encounter (HOSPITAL_COMMUNITY)
Admission: EM | Disposition: A | Discharge status: Home / Self Care | Payer: Self-pay | Source: Home / Self Care | Attending: Orthopedic Surgery

## 2024-04-28 ENCOUNTER — Inpatient Hospital Stay (HOSPITAL_COMMUNITY)

## 2024-04-28 ENCOUNTER — Encounter (HOSPITAL_COMMUNITY): Payer: Self-pay | Admitting: Orthopedic Surgery

## 2024-04-28 ENCOUNTER — Inpatient Hospital Stay (HOSPITAL_COMMUNITY): Admitting: Anesthesiology

## 2024-04-28 DIAGNOSIS — E1122 Type 2 diabetes mellitus with diabetic chronic kidney disease: Secondary | ICD-10-CM | POA: Diagnosis not present

## 2024-04-28 DIAGNOSIS — S82872B Displaced pilon fracture of left tibia, initial encounter for open fracture type I or II: Secondary | ICD-10-CM | POA: Diagnosis not present

## 2024-04-28 DIAGNOSIS — I129 Hypertensive chronic kidney disease with stage 1 through stage 4 chronic kidney disease, or unspecified chronic kidney disease: Secondary | ICD-10-CM | POA: Diagnosis not present

## 2024-04-28 DIAGNOSIS — N1831 Chronic kidney disease, stage 3a: Secondary | ICD-10-CM | POA: Diagnosis not present

## 2024-04-28 HISTORY — PX: EXTERNAL FIXATION REMOVAL: SHX5040

## 2024-04-28 HISTORY — PX: OPEN REDUCTION INTERNAL FIXATION (ORIF) TIBIA/FIBULA FRACTURE: SHX5992

## 2024-04-28 MED ORDER — MIDAZOLAM HCL 2 MG/2ML IJ SOLN
INTRAMUSCULAR | Status: AC
  Filled 2024-04-28: qty 2

## 2024-04-28 MED ORDER — ONDANSETRON HCL 4 MG/2ML IJ SOLN
INTRAMUSCULAR | Status: DC | PRN
  Administered 2024-04-28: 4 mg via INTRAVENOUS

## 2024-04-28 MED ORDER — PROPOFOL 10 MG/ML IV BOLUS
INTRAVENOUS | Status: DC | PRN
  Administered 2024-04-28: 200 mg via INTRAVENOUS

## 2024-04-28 MED ORDER — BUPIVACAINE-MELOXICAM ER 200-6 MG/7ML IJ SOLN
INTRAMUSCULAR | Status: DC | PRN
  Administered 2024-04-28: 200 mg

## 2024-04-28 MED ORDER — CHLORHEXIDINE GLUCONATE 0.12 % MT SOLN
OROMUCOSAL | Status: AC
  Administered 2024-04-28: 15 mL via OROMUCOSAL
  Filled 2024-04-28: qty 15

## 2024-04-28 MED ORDER — VANCOMYCIN HCL 1000 MG IV SOLR
INTRAVENOUS | Status: DC | PRN
  Administered 2024-04-28: 1000 mg

## 2024-04-28 MED ORDER — TOBRAMYCIN SULFATE 1.2 G IJ SOLR
INTRAMUSCULAR | Status: AC
  Filled 2024-04-28: qty 1.2

## 2024-04-28 MED ORDER — LABETALOL HCL 5 MG/ML IV SOLN
10.0000 mg | Freq: Once | INTRAVENOUS | Status: AC
  Administered 2024-04-28: 10 mg via INTRAVENOUS

## 2024-04-28 MED ORDER — HYDROMORPHONE HCL 1 MG/ML IJ SOLN
INTRAMUSCULAR | Status: DC | PRN
  Administered 2024-04-28 (×2): .5 mg via INTRAVENOUS

## 2024-04-28 MED ORDER — LACTATED RINGERS IV SOLN: INTRAVENOUS | Status: DC

## 2024-04-28 MED ORDER — PHENYLEPHRINE HCL-NACL 20-0.9 MG/250ML-% IV SOLN
INTRAVENOUS | Status: DC | PRN
  Administered 2024-04-28: 20 ug/min via INTRAVENOUS

## 2024-04-28 MED ORDER — PROPOFOL 10 MG/ML IV BOLUS
INTRAVENOUS | Status: AC
  Filled 2024-04-28: qty 20

## 2024-04-28 MED ORDER — ORAL CARE MOUTH RINSE: 15.0000 mL | Freq: Once | OROMUCOSAL | Status: AC

## 2024-04-28 MED ORDER — BUPIVACAINE-MELOXICAM ER 400-12 MG/14ML IJ SOLN
INTRAMUSCULAR | Status: AC
  Filled 2024-04-28: qty 1

## 2024-04-28 MED ORDER — CHLORHEXIDINE GLUCONATE 0.12 % MT SOLN: 15.0000 mL | Freq: Once | OROMUCOSAL | Status: AC

## 2024-04-28 MED ORDER — HYDROMORPHONE HCL 1 MG/ML IJ SOLN
INTRAMUSCULAR | Status: AC
  Filled 2024-04-28: qty 0.5

## 2024-04-28 MED ORDER — METHYLENE BLUE 20 MG/2ML IV SOSY
PREFILLED_SYRINGE | INTRAVENOUS | Status: DC | PRN
  Administered 2024-04-28: 2 mL

## 2024-04-28 MED ORDER — VANCOMYCIN HCL 1000 MG IV SOLR
INTRAVENOUS | Status: AC
  Filled 2024-04-28: qty 20

## 2024-04-28 MED ORDER — STERILE WATER FOR IRRIGATION IR SOLN
Status: DC | PRN
  Administered 2024-04-28: 1000 mL

## 2024-04-28 MED ORDER — 0.9 % SODIUM CHLORIDE (POUR BTL) OPTIME
TOPICAL | Status: DC | PRN
  Administered 2024-04-28: 1000 mL

## 2024-04-28 MED ORDER — DEXAMETHASONE SOD PHOSPHATE PF 10 MG/ML IJ SOLN
INTRAMUSCULAR | Status: DC | PRN
  Administered 2024-04-28: 10 mg via INTRAVENOUS

## 2024-04-28 MED ORDER — LACTATED RINGERS IV SOLN: INTRAVENOUS | Status: DC | PRN

## 2024-04-28 MED ORDER — LABETALOL HCL 5 MG/ML IV SOLN
INTRAVENOUS | Status: AC
  Filled 2024-04-28: qty 4

## 2024-04-28 MED ORDER — DEXMEDETOMIDINE HCL IN NACL 80 MCG/20ML IV SOLN
INTRAVENOUS | Status: DC | PRN
  Administered 2024-04-28: 8 ug via INTRAVENOUS

## 2024-04-28 MED ORDER — BUPIVACAINE-MELOXICAM ER 200-6 MG/7ML IJ SOLN
INTRAMUSCULAR | Status: AC
  Filled 2024-04-28: qty 1

## 2024-04-28 MED ORDER — FENTANYL CITRATE (PF) 250 MCG/5ML IJ SOLN
INTRAMUSCULAR | Status: DC | PRN
  Administered 2024-04-28 (×4): 50 ug via INTRAVENOUS

## 2024-04-28 MED ORDER — FENTANYL CITRATE (PF) 100 MCG/2ML IJ SOLN
INTRAMUSCULAR | Status: AC
  Filled 2024-04-28: qty 2

## 2024-04-28 MED ORDER — METHYLENE BLUE 20 MG/2ML IV SOSY
PREFILLED_SYRINGE | INTRAVENOUS | Status: AC
  Filled 2024-04-28: qty 2

## 2024-04-28 MED ORDER — CEFAZOLIN SODIUM-DEXTROSE 2-4 GM/100ML-% IV SOLN
2.0000 g | Freq: Three times a day (TID) | INTRAVENOUS | Status: AC
  Administered 2024-04-28 – 2024-04-29 (×3): 2 g via INTRAVENOUS
  Filled 2024-04-28 (×3): qty 100

## 2024-04-28 MED ORDER — PHENYLEPHRINE 80 MCG/ML (10ML) SYRINGE FOR IV PUSH (FOR BLOOD PRESSURE SUPPORT)
PREFILLED_SYRINGE | INTRAVENOUS | Status: DC | PRN
  Administered 2024-04-28: 160 ug via INTRAVENOUS

## 2024-04-28 MED ORDER — LIDOCAINE 2% (20 MG/ML) 5 ML SYRINGE
INTRAMUSCULAR | Status: DC | PRN
  Administered 2024-04-28: 60 mg via INTRAVENOUS

## 2024-04-28 MED ORDER — ROCURONIUM BROMIDE 10 MG/ML (PF) SYRINGE
PREFILLED_SYRINGE | INTRAVENOUS | Status: DC | PRN
  Administered 2024-04-28: 50 mg via INTRAVENOUS

## 2024-04-28 MED ORDER — ENOXAPARIN SODIUM 40 MG/0.4ML IJ SOSY
40.0000 mg | PREFILLED_SYRINGE | INTRAMUSCULAR | Status: DC
  Administered 2024-04-29: 40 mg via SUBCUTANEOUS
  Filled 2024-04-28 (×2): qty 0.4

## 2024-04-28 MED ORDER — TOBRAMYCIN SULFATE 1.2 G IJ SOLR
INTRAMUSCULAR | Status: DC | PRN
  Administered 2024-04-28: 1.2 g

## 2024-04-28 NOTE — Progress Notes (Signed)
 Patient reassured. She had claustrophobic concerns about getting on the elevator. No concerns about surgery.  The risks and benefits of right open pilon repair and possible exchange of antibiotic spacer were discussed with the patient, including the possibility of infection, nerve injury, vessel injury, wound breakdown, arthritis, symptomatic hardware, DVT/ PE, loss of motion, malunion, nonunion, and need for further surgery among others. In the future she will need bulk autografting likely with reamed intramedullary aspirate from the right femur in 5-7 weeks. These risks were acknowledged and consent was provided to proceed.  Ozell Bruch, MD [facility], [facility] [phone]

## 2024-04-28 NOTE — Anesthesia Procedure Notes (Signed)
 Procedure Name: Intubation Date/Time: 04/28/2024 10:09 AM  Performed by: Scherrie Mast, CRNAPre-anesthesia Checklist: Patient identified, Emergency Drugs available, Suction available and Patient being monitored Patient Re-evaluated:Patient Re-evaluated prior to induction Oxygen Delivery Method: Circle System Utilized Preoxygenation: Pre-oxygenation with 100% oxygen Induction Type: IV induction Ventilation: Mask ventilation without difficulty Laryngoscope Size: Miller and 2 Grade View: Grade I Tube type: Oral Tube size: 7.0 mm Number of attempts: 1 Airway Equipment and Method: Stylet and Oral airway Placement Confirmation: ETT inserted through vocal cords under direct vision, positive ETCO2 and breath sounds checked- equal and bilateral Secured at: 22 cm Tube secured with: Tape Dental Injury: Teeth and Oropharynx as per pre-operative assessment

## 2024-04-28 NOTE — Transfer of Care (Signed)
 Immediate Anesthesia Transfer of Care Note  Patient: [name]  Procedure(s) Performed: OPEN REDUCTION INTERNAL FIXATION (ORIF) TIBIA/FIBULA FRACTURE (Right: Leg Lower) REMOVAL, EXTERNAL FIXATION DEVICE, LOWER EXTREMITY (Right: Leg Lower)  Patient Location: PACU  Anesthesia Type:General  Level of Consciousness: awake, alert , and oriented  Airway & Oxygen Therapy: Patient Spontanous Breathing and Patient connected to nasal cannula oxygen  Post-op Assessment: Report given to RN and Post -op Vital signs reviewed and stable  Post vital signs: Reviewed and stable  Last Vitals:  Vitals Value Taken Time  BP 185/98 1339  Temp 97 1339  Pulse 85 04/28/24 13:39  Resp 14 04/28/24 13:39  SpO2 93 % 04/28/24 13:39  Vitals shown include unfiled device data.  Last Pain:  Vitals:   04/28/24 0935  TempSrc:   PainSc: 0-No pain      Patients Stated Pain Goal: 3 (04/24/24 2327)  Complications: No notable events documented.

## 2024-04-28 NOTE — Plan of Care (Signed)
" °  Problem: Skin Integrity: Goal: Demonstrates signs of wound healing without infection Outcome: Progressing   Problem: Pain Managment: Goal: General experience of comfort will improve and/or be controlled Outcome: Progressing   "
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
## 2024-04-28 NOTE — Op Note (Signed)
 04/28/2024  2:00 PM  PATIENT:  [name]  [birth year] female   MEDICAL RECORD NUMBER: [id number]  PRE-OPERATIVE DIAGNOSIS:   1. RIGHT OPEN ANKLE PILON FRACTURE, TIBIA AND FIBULA 2. RETAINED EXTERNALFIXATOR RIGHT ANKLE 3. CEMENT SPACER  POST-OPERATIVE DIAGNOSIS:   1. RIGHT OPEN ANKLE PILON FRACTURE, TIBIA AND FIBULA 2. RETAINED EXTERNALFIXATOR RIGHT ANKLE 3. CEMENT SPACER 4. DISRUPTION OF SYNDESMOSIS  PROCEDURE:  Procedure(s): 1. OPEN REDUCTION INTERNAL FIXATION OF RIGHT ANKLE PILON FRACTURE, TIBIA ONLY 2. REMOVAL EXTERNAL FIXATION  LEFT LEG (Left) 3. REMOVAL AND REPLACEMENT OF ANTIBIOTIC SPACER 4. REPAIR OF RIGHT ANKLE SYNDESMOSIS 5. RETENTION SUTURE CLOSURE 20 CM  SURGEON:  Surgeon(s) and Role:    DEWAINE Celena Sharper, MD - Primary  PHYSICIAN ASSISTANT: 1. KEITH PAUL, PA-C; 2. PA Student  ANESTHESIA:   general  EBL:  100 mL   BLOOD ADMINISTERED:none  DRAINS: none   LOCAL MEDICATIONS USED:  NONE  SPECIMEN:  No Specimen  DISPOSITION OF SPECIMEN:  N/A  COUNTS:  YES  TOURNIQUET: None  DICTATION: Note written in EPIC  PLAN OF CARE: Discharge to home after PACU  PATIENT DISPOSITION:  PACU - hemodynamically stable.   Delay start of Pharmacological VTE agent (>24hrs) due to surgical blood loss or risk of bleeding: no  INDICATION FOR PROCEDURE: [name] is a 59 y.o. who sustained a displaced and shortened pilon fracture treated initially and provisionally with external fixation to restore length and alignment while waiting for adequate soft tissue swelling resolution.  The patient was initially seen and evaluated by Dr. Oretha [name], who recognized the complexity of the injury and asserted this was outside his scope of practice and that this injury would be best managed by a fellowship trained orthopedic traumatologist. Consequently, we were consulted to evaluate the patient and provide appropriate treatment, which included repeat debridement of the open fracture,  placement of an antibiotic spacer, and adjustment of the external fixator.  She now returns to the OR for definitive treatment of the fracture but with expected gait change of the antibiotic spacer so that she can have later staged autografting of her extremely large bone defect, greater than 40 cm. I discussed with the patient and her husband the risks and benefits of surgery, including the possibility of infection, nerve injury, vessel injury, wound breakdown, arthritis, symptomatic hardware, DVT/ PE, loss of motion, malunion, nonunion, and need for further surgery among others.  We also specifically discussed need to stage surgery because of the elevated risk of soft tissue breakdown that could lead to amputation.  Furthermore, we addressed the syndesmosis ligament, which would be assessed following fracture repair. If unstable, this would require additional fixation and that some of this fixation may require later removal. These risks were acknowledged and consent provided to proceed.  SUMMARY OF PROCEDURE:  The patient was taken to the operating room after administration of preoperative antibiotics.  General anesthesia was induced.  Because of the time from injury the external fixator was retained during the standard prep and drape. The right lower extremity was cleaned with chlorhexidine  soap and scrub and then followed by Betadine scrub and paint. A time out was held.  The fixator was initially maintained because of the excellent overall alignment and restoration of length with the very large bone defect.  Because we had wrinkling in the anticipated area of incision, we felt that it was most prudent to proceed with direct fixation using a limited dissection and a midline incision. Dissection was carried carefully down  where the anterior compartment was released, and the anterior tib swept laterally.  The fracture lines were identified and cleaned with curette and lavage while preserving  periosteal attachments.  A reduction maneuver with the large tenaculum was then performed of the lateral aspect of the plafond.  This was compressed and an anterior-to-posterior standard screw placed to secure it through long Biomet anterolateral plate.  Additional standard screws were placed into subchondral bone and then a single standard screw proximally while fine-tuning our alignment in the coronal plane.  Appropriate reduction was checked on lateral as well.  Additional lock screws were then placed distally in standard screws proximally.  I did remove some of the lateral cement spacer to make sure it was not incarcerated by the plate but left some medially to again help hold alignment and length.  After all screws were placed in the tibial plate the rest of the spacer was removed.  We then attempted to instrument the fibula.  In order to obtain the correct trajectory, removal of the external fixator was required.  The fixator clamps were then loosened and the pins removed, followed by a thorough scrubbing with chlorhexidine  and wash. This canal and all pin sites were thoroughly irrigated thoroughly with saline. The pins sites were protected from the field with ioban. Fresh gloves were applied by operative staff.    During attempted long 3.5 mm screw placement, gross instability of the distal fibula was observed with total disruption of the syndesmosis. An additional lateral incision at this time was precluded by swelling meaning that only an anterior plate through the current anterior incision was an option for stabilization of the fibula, provided that we could obtain any fixation at all from lateral to medial to secure the syndesmosis.  I made a small lateral incision and passed the drill at a level slightly proximal to the plafond and attempted to gain fixation in the distal block of bone, but could not find a passageway through the screws securing the tibia.  There was neither a near nor far cortex  over the entirety of the syndesmosis.  This left the only possibility of repair as suture.  This was performed through drill tunnels around the plate using #1 PDS with 2 separate sutures.  I also did a direct anterior open repair of the syndesmosis with the 0 PDS.  Marked comminution of the fibula remained visible from the anterior incision but could not be addressed at this time, rather the plan will be to perform a lateral approach and bone grafting at the same time we removed the antibiotic spacer in the tibia and placed allograft there.  Gaining fixation in the tibia will still be a challenge at that time but soft tissue risk should be greatly reduced and acceptable.  C-arm was brought in to confirm accepatable reduction and hardware placement.  Because of swelling and thin soft tissue condition superficially, retention sutures were initially necessary over the entirety of the anterior incision which was 20 cm.  Eventually horizontal sutures were able to be added to this for a more robust closure.  The pin sites were aggressively irrigated but did not require a separate formal debridement.  My assistant, Francis Mt, PAC, retracted the nerve and vessel to protect it throughout and also helped to place provisional fixation and produced compression while I was tightening definitive fixation.   Sterile gently compressive dressing was applied and then, a posterior and stirrup splint.  The patient was awakened from anesthesia and transported to  PACU in stable condition.  Again, Francis Mt, PAC, did assist me throughout.   PROGNOSIS: VERNESHA TALBOT is at increased risk for infection, nonunion, delayed union, and soft tissue complications because of type IIIa open pilon fracture with large segmental bone loss and ankle instability.  She will undergo grafting of this large defect at 5 to 7 weeks using the masculet technique with the removal of the antibiotic spacer and placement of reamed intramedullary aspirate  from the right femur. PT will assist with nonweightbearing for the next 8 weeks with protected graduated weightbearing once she has sufficient healing and will also be on formal pharmacologic DVT Prophylaxis beginning now.

## 2024-04-28 NOTE — Anesthesia Postprocedure Evaluation (Signed)
"   Anesthesia Post Note  Patient: [name]  Procedure(s) Performed: OPEN REDUCTION INTERNAL FIXATION (ORIF) TIBIA/FIBULA FRACTURE (Right: Leg Lower) REMOVAL, EXTERNAL FIXATION DEVICE, LOWER EXTREMITY (Right: Leg Lower)     Patient location during evaluation: PACU Anesthesia Type: General Level of consciousness: awake and alert and oriented Pain management: pain level controlled Vital Signs Assessment: post-procedure vital signs reviewed and stable Respiratory status: spontaneous breathing, nonlabored ventilation and respiratory function stable Cardiovascular status: blood pressure returned to baseline and stable Postop Assessment: no apparent nausea or vomiting Anesthetic complications: no   There were no known notable events for this encounter.  Last Vitals:  Vitals:   04/28/24 1415 04/28/24 1456  BP: (!) 160/88 (!) 160/72  Pulse: 62 67  Resp: 14 14  Temp:  36.9 C  SpO2: 98% 94%    Last Pain:  Vitals:   04/28/24 1456  TempSrc: Oral  PainSc:                  Kruz Chiu A.      "

## 2024-04-29 ENCOUNTER — Inpatient Hospital Stay (HOSPITAL_COMMUNITY)

## 2024-04-29 ENCOUNTER — Other Ambulatory Visit (HOSPITAL_COMMUNITY): Payer: Self-pay

## 2024-04-29 ENCOUNTER — Encounter (HOSPITAL_COMMUNITY): Payer: Self-pay | Admitting: Orthopedic Surgery
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
## 2024-04-29 NOTE — Plan of Care (Signed)
  Problem: Education: Goal: Knowledge of the prescribed therapeutic regimen will improve Outcome: Progressing   Problem: Bowel/Gastric: Goal: Gastrointestinal status for postoperative course will improve Outcome: Progressing   Problem: Education: Goal: Knowledge of General Education information will improve Description: Including pain rating scale, medication(s)/side effects and non-pharmacologic comfort measures Outcome: Progressing   Problem: Health Behavior/Discharge Planning: Goal: Ability to manage health-related needs will improve Outcome: Progressing
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
## 2024-04-29 NOTE — Plan of Care (Signed)
" °  Problem: Education: Goal: Knowledge of the prescribed therapeutic regimen will improve Outcome: Progressing   Problem: Bowel/Gastric: Goal: Gastrointestinal status for postoperative course will improve Outcome: Progressing   Problem: Cardiac: Goal: Ability to maintain an adequate cardiac output Outcome: Progressing   Problem: Neurological: Goal: Will regain or maintain usual level of consciousness Outcome: Progressing   Problem: Respiratory: Goal: Will regain and/or maintain adequate ventilation Outcome: Progressing   "
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
## 2024-04-30 ENCOUNTER — Other Ambulatory Visit (HOSPITAL_COMMUNITY): Payer: Self-pay

## 2024-04-30 DIAGNOSIS — S022XXA Fracture of nasal bones, initial encounter for closed fracture: Secondary | ICD-10-CM | POA: Diagnosis present

## 2024-04-30 LAB — CBC
HCT: 28.1 % — ABNORMAL LOW (ref 36.0–46.0)
Hemoglobin: 8.9 g/dL — ABNORMAL LOW (ref 12.0–15.0)
MCH: 25.9 pg — ABNORMAL LOW (ref 26.0–34.0)
MCHC: 31.7 g/dL (ref 30.0–36.0)
MCV: 81.7 fL (ref 80.0–100.0)
Platelets: 332 K/uL (ref 150–400)
RBC: 3.44 MIL/uL — ABNORMAL LOW (ref 3.87–5.11)
RDW: 15.4 % (ref 11.5–15.5)
WBC: 13 K/uL — ABNORMAL HIGH (ref 4.0–10.5)
nRBC: 0 % (ref 0.0–0.2)

## 2024-04-30 MED ORDER — OXYCODONE HCL 5 MG PO TABS
5.0000 mg | ORAL_TABLET | Freq: Three times a day (TID) | ORAL | 0 refills | Status: AC | PRN
  Filled 2024-04-30: qty 40, 7d supply, fill #0

## 2024-04-30 MED ORDER — METHOCARBAMOL 500 MG PO TABS
500.0000 mg | ORAL_TABLET | Freq: Four times a day (QID) | ORAL | 0 refills | Status: DC | PRN
  Filled 2024-04-30: qty 60, 8d supply, fill #0

## 2024-04-30 MED ORDER — DOCUSATE SODIUM 100 MG PO CAPS
100.0000 mg | ORAL_CAPSULE | Freq: Every day | ORAL | 2 refills | Status: AC | PRN
  Filled 2024-04-30: qty 30, 30d supply, fill #0

## 2024-04-30 MED ORDER — VITAMIN D3 125 MCG (5000 UT) PO TABS
1.0000 | ORAL_TABLET | Freq: Every day | ORAL | 6 refills | Status: AC
  Filled 2024-04-30: qty 30, 30d supply, fill #0

## 2024-04-30 MED ORDER — APIXABAN 2.5 MG PO TABS
2.5000 mg | ORAL_TABLET | Freq: Two times a day (BID) | ORAL | 0 refills | Status: AC
  Filled 2024-04-30: qty 60, 30d supply, fill #0

## 2024-04-30 MED ORDER — ACETAMINOPHEN 325 MG PO TABS
650.0000 mg | ORAL_TABLET | Freq: Four times a day (QID) | ORAL | 1 refills | Status: AC
  Filled 2024-04-30: qty 90, 12d supply, fill #0
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
## 2024-04-30 NOTE — Progress Notes (Signed)
 Orthopedic Tech Progress Note Patient Details:  [name] [birth year] [id number]  Ortho Devices Type of Ortho Device: CAM walker Ortho Device/Splint Location: LLE Ortho Device/Splint Interventions: Ordered   Post Interventions Patient Tolerated: Fair Instructions Provided: Care of device  Atul Delucia [name] 04/30/2024, 1:42 PM

## 2024-04-30 NOTE — Plan of Care (Signed)
" °  Problem: Education: Goal: Knowledge of the prescribed therapeutic regimen will improve Outcome: Progressing   Problem: Bowel/Gastric: Goal: Gastrointestinal status for postoperative course will improve Outcome: Progressing   Problem: Cardiac: Goal: Ability to maintain an adequate cardiac output Outcome: Progressing   Problem: Education: Goal: Knowledge of General Education information will improve Description: Including pain rating scale, medication(s)/side effects and non-pharmacologic comfort measures Outcome: Progressing   Problem: Health Behavior/Discharge Planning: Goal: Ability to manage health-related needs will improve Outcome: Progressing   "

## 2024-04-30 NOTE — TOC Transition Note (Signed)
 Transition of Care [facility]) - Discharge Note   Patient Details  Name: [name] MRN: [id number] Date of Birth: [birth year]  Transition of Care [facility]) CM/SW Contact:  Corean JAYSON Canary, RN Phone Number: 04/30/2024, 11:56 AM   Clinical Narrative:    Patient discharging  today, has DME at bedside according to Nursing No Further needs identified   Final next level of care: Home/Self Care Barriers to Discharge: No Barriers Identified   Patient Goals and CMS Choice     Choice offered to / list presented to : Patient      Discharge Placement                       Discharge Plan and Services Additional resources added to the After Visit Summary for     Discharge Planning Services: CM Consult            DME Arranged: Bedside commode, Walker rolling, Wheelchair manual   Date DME Agency Contacted: 04/29/24 Time DME Agency Contacted: [id number] Representative spoke with at DME Agency: Zachary            Social Drivers of Health (SDOH) Interventions SDOH Screenings   Food Insecurity: No Food Insecurity (04/24/2024)  Housing: Low Risk (04/24/2024)  Transportation Needs: No Transportation Needs (04/24/2024)  Utilities: Not At Risk (04/24/2024)  Depression (PHQ2-9): Medium Risk (12/12/2021)  Financial Resource Strain: Low Risk (05/23/2021)   Received from [facility]  Physical Activity: Insufficiently Active (05/23/2021)   Received from [facility]  Social Connections: Unknown (08/23/2021)   Received from [facility]  Stress: No Stress Concern Present (05/23/2021)   Received from [facility]  Tobacco Use: Low Risk (04/28/2024)     Readmission Risk Interventions     No data to display

## 2024-04-30 NOTE — Discharge Summary (Cosign Needed)
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   EPINEPHrine  0.3 mg/0.3 mL Soaj injection Commonly known as: EPI-PEN Inject into thigh as directed for severe allergic reaction/asthma.   esomeprazole  40 MG capsule Commonly known as: NEXIUM  Take 1 capsule (40 mg total) by mouth every morning.   furosemide  40 MG tablet Commonly known as: LASIX  TAKE 1 TABLET BY MOUTH ONCE DAILY AS NEEDED What changed: reasons to take this   ipratropium-albuterol  0.5-2.5 (3) MG/3ML Soln Commonly known as: DUONEB Inhale 1 vial via nebulizer every 4 hours if needed.   levalbuterol  1.25 MG/0.5ML nebulizer solution Commonly known as: XOPENEX  Inhale 1.25 mg into the lungs via nebulizer 3 (three) times daily.   levalbuterol  45 MCG/ACT inhaler Commonly known as: XOPENEX  HFA Inhale 2 puffs into the lungs every 4 (four) hours.   levocetirizine 5 MG tablet Commonly known as: XYZAL  Take 1 tablet (5 mg total)  by mouth every evening.   LORazepam  1 MG tablet Commonly known as: ATIVAN  Take 1 tablet (1 mg total) by mouth every 8 (eight) hours as needed for anxiety   methocarbamol  500 MG tablet Commonly known as: ROBAXIN  Take 1-2 tablets (500-1,000 mg total) by mouth every 6 (six) hours as needed.   montelukast  10 MG tablet Commonly known as: SINGULAIR  Take 1 tablet (10 mg total) by mouth at bedtime. What changed:  when to take this reasons to take this   Mounjaro  12.5 MG/0.5ML Pen Generic drug: tirzepatide  Inject 12.5 mg into the skin once a week.   ondansetron  4 MG disintegrating tablet Commonly known as: ZOFRAN -ODT Dissolve 1 tablet (4 mg total) by mouth every 8 (eight) hours as needed for nausea or vomiting.   oxyCODONE  5 MG immediate release tablet Commonly known as: Oxy IR/ROXICODONE  Take 1-2 tablets (5-10 mg total) by mouth every 8 (eight) hours as needed for moderate pain (pain score 4-6) or severe pain (pain score 7-10).   Vitamin D  (Ergocalciferol ) 1.25 MG (50000 UNIT) Caps capsule Commonly known as: DRISDOL  Take 1 capsule (50,000 Units total) by mouth every 7 (seven) days.   Vitamin D3 125 MCG (5000 UT) Caps Take 1 capsule (5,000 Units total) by mouth daily.               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  A walker will not resolve  issue with performing activities of daily living. A wheelchair will allow patient to safely perform daily activities. Patient is not able to propel themselves in the home using a standard weight wheelchair due to general weakness. Patient can self propel in the lightweight wheelchair. Length of need 6 months . Accessories: elevating leg rests (ELRs), wheel locks,  extensions and anti-tippers.   04/28/24 1459   04/28/24 1458  For home use only DME Walker rolling  Once       Question Answer Comment  Walker: With 5 Inch Wheels   Patient needs a walker to treat with the following condition Fx ankle      04/28/24 1459   04/28/24 1458  For home use only DME Bedside commode  Once       Question:  Patient needs a bedside commode to treat with the following condition  Answer:  Fx ankle   04/28/24 1459              Discharge Care Instructions  (From admission, onward)           Start     Ordered   04/30/24 0000  Non weight bearing       Question Answer Comment  Laterality right   Extremity Lower      04/30/24 1114            Follow-up Information     Royal Dallas HERO, MD. Schedule an appointment as soon as possible for a visit in 1 week.   Specialty: Plastic Surgery Why: As needed; nasal fracture Contact information: [address] [place] [address] [phone]         [facility] .   Specialty: Emergency Medicine Why: If symptoms worsen Contact information: [address] [place]  [address] [phone]        Catalina Bare, MD Follow up.   Specialty: Internal Medicine Contact information: [address] [place] [address] [phone]         Celena Sharper, MD. Schedule an appointment as soon as possible for a visit in 2 week(s).   Specialty: Orthopedic Surgery Contact information: [address] [place] [address] [phone]                  Signed:  Francis MICAEL Mt, PA-C [phone] (C) 04/30/2024, 11:18 AM  [facility] [address] [place] [address] [phone] (F)    "

## 2024-04-30 NOTE — Plan of Care (Signed)
" °  Problem: Education: Goal: Knowledge of the prescribed therapeutic regimen will improve 04/30/2024 1250 by Delores Kirsch, RN Outcome: Adequate for Discharge 04/30/2024 1249 by Delores Kirsch, RN Outcome: Progressing   Problem: Bowel/Gastric: Goal: Gastrointestinal status for postoperative course will improve 04/30/2024 1250 by Delores Kirsch, RN Outcome: Adequate for Discharge 04/30/2024 1249 by Delores Kirsch, RN Outcome: Progressing   Problem: Cardiac: Goal: Ability to maintain an adequate cardiac output 04/30/2024 1250 by Delores Kirsch, RN Outcome: Adequate for Discharge 04/30/2024 1249 by Delores Kirsch, RN Outcome: Progressing Goal: Will show no evidence of cardiac arrhythmias Outcome: Adequate for Discharge   Problem: Nutritional: Goal: Will attain and maintain optimal nutritional status Outcome: Adequate for Discharge   Problem: Nutritional: Goal: Will attain and maintain optimal nutritional status Outcome: Adequate for Discharge   Problem: Neurological: Goal: Will regain or maintain usual level of consciousness Outcome: Adequate for Discharge   Problem: Clinical Measurements: Goal: Ability to maintain clinical measurements within normal limits Outcome: Adequate for Discharge Goal: Postoperative complications will be avoided or minimized Outcome: Adequate for Discharge   Problem: Respiratory: Goal: Will regain and/or maintain adequate ventilation Outcome: Adequate for Discharge Goal: Respiratory status will improve Outcome: Adequate for Discharge   Problem: Skin Integrity: Goal: Demonstrates signs of wound healing without infection Outcome: Adequate for Discharge   Problem: Urinary Elimination: Goal: Will remain free from infection Outcome: Adequate for Discharge Goal: Ability to achieve and maintain adequate urine output Outcome: Adequate for Discharge   Problem: Education: Goal: Knowledge of General Education information will improve Description: Including  pain rating scale, medication(s)/side effects and non-pharmacologic comfort measures 04/30/2024 1250 by Delores Kirsch, RN Outcome: Adequate for Discharge 04/30/2024 1249 by Delores Kirsch, RN Outcome: Progressing   Problem: Health Behavior/Discharge Planning: Goal: Ability to manage health-related needs will improve 04/30/2024 1250 by Delores Kirsch, RN Outcome: Adequate for Discharge 04/30/2024 1249 by Delores Kirsch, RN Outcome: Progressing   Problem: Clinical Measurements: Goal: Ability to maintain clinical measurements within normal limits will improve Outcome: Adequate for Discharge Goal: Will remain free from infection Outcome: Adequate for Discharge Goal: Diagnostic test results will improve Outcome: Adequate for Discharge Goal: Respiratory complications will improve Outcome: Adequate for Discharge Goal: Cardiovascular complication will be avoided Outcome: Adequate for Discharge   Problem: Activity: Goal: Risk for activity intolerance will decrease Outcome: Adequate for Discharge   Problem: Nutrition: Goal: Adequate nutrition will be maintained Outcome: Adequate for Discharge   Problem: Coping: Goal: Level of anxiety will decrease Outcome: Adequate for Discharge   Problem: Elimination: Goal: Will not experience complications related to bowel motility Outcome: Adequate for Discharge Goal: Will not experience complications related to urinary retention Outcome: Adequate for Discharge   Problem: Pain Managment: Goal: General experience of comfort will improve and/or be controlled Outcome: Adequate for Discharge   Problem: Safety: Goal: Ability to remain free from injury will improve Outcome: Adequate for Discharge   Problem: Skin Integrity: Goal: Risk for impaired skin integrity will decrease Outcome: Adequate for Discharge   "
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
## 2024-05-13 ENCOUNTER — Other Ambulatory Visit (HOSPITAL_COMMUNITY): Payer: Self-pay

## 2024-05-13 ENCOUNTER — Other Ambulatory Visit (HOSPITAL_BASED_OUTPATIENT_CLINIC_OR_DEPARTMENT_OTHER): Payer: Self-pay

## 2024-05-13 MED ORDER — METHOCARBAMOL 500 MG PO TABS
500.0000 mg | ORAL_TABLET | Freq: Four times a day (QID) | ORAL | 1 refills | Status: AC | PRN
  Filled 2024-05-13: qty 50, 13d supply, fill #0
  Filled 2024-05-14: qty 50, 7d supply, fill #0

## 2024-05-14 ENCOUNTER — Other Ambulatory Visit (HOSPITAL_BASED_OUTPATIENT_CLINIC_OR_DEPARTMENT_OTHER): Payer: Self-pay

## 2024-05-14 ENCOUNTER — Other Ambulatory Visit (HOSPITAL_COMMUNITY): Payer: Self-pay

## 2024-06-09 ENCOUNTER — Ambulatory Visit: Admitting: Family Medicine
